# Patient Record
Sex: Female | Born: 1935 | Race: White | Hispanic: No | Marital: Married | Smoking: Never smoker
Health system: Southern US, Community
[De-identification: ages and names within clinical notes are randomized; demographics above are authoritative.]

## PROBLEM LIST (undated history)

## (undated) DIAGNOSIS — R002 Palpitations: Secondary | ICD-10-CM

## (undated) DIAGNOSIS — C50919 Malignant neoplasm of unspecified site of unspecified female breast: Secondary | ICD-10-CM

## (undated) DIAGNOSIS — J349 Unspecified disorder of nose and nasal sinuses: Secondary | ICD-10-CM

## (undated) DIAGNOSIS — I10 Essential (primary) hypertension: Secondary | ICD-10-CM

## (undated) DIAGNOSIS — M8580 Other specified disorders of bone density and structure, unspecified site: Secondary | ICD-10-CM

## (undated) DIAGNOSIS — K219 Gastro-esophageal reflux disease without esophagitis: Secondary | ICD-10-CM

## (undated) DIAGNOSIS — R238 Other skin changes: Secondary | ICD-10-CM

## (undated) DIAGNOSIS — I739 Peripheral vascular disease, unspecified: Secondary | ICD-10-CM

## (undated) DIAGNOSIS — M797 Fibromyalgia: Secondary | ICD-10-CM

## (undated) DIAGNOSIS — N281 Cyst of kidney, acquired: Secondary | ICD-10-CM

## (undated) DIAGNOSIS — R232 Flushing: Secondary | ICD-10-CM

## (undated) DIAGNOSIS — M549 Dorsalgia, unspecified: Secondary | ICD-10-CM

## (undated) DIAGNOSIS — R5383 Other fatigue: Secondary | ICD-10-CM

## (undated) DIAGNOSIS — J189 Pneumonia, unspecified organism: Secondary | ICD-10-CM

## (undated) DIAGNOSIS — M1990 Unspecified osteoarthritis, unspecified site: Secondary | ICD-10-CM

## (undated) DIAGNOSIS — IMO0001 Reserved for inherently not codable concepts without codable children: Secondary | ICD-10-CM

## (undated) DIAGNOSIS — R011 Cardiac murmur, unspecified: Secondary | ICD-10-CM

## (undated) DIAGNOSIS — R32 Unspecified urinary incontinence: Secondary | ICD-10-CM

## (undated) DIAGNOSIS — R42 Dizziness and giddiness: Secondary | ICD-10-CM

## (undated) DIAGNOSIS — R490 Dysphonia: Secondary | ICD-10-CM

## (undated) DIAGNOSIS — N390 Urinary tract infection, site not specified: Secondary | ICD-10-CM

## (undated) DIAGNOSIS — E214 Other specified disorders of parathyroid gland: Secondary | ICD-10-CM

## (undated) DIAGNOSIS — G4730 Sleep apnea, unspecified: Secondary | ICD-10-CM

## (undated) DIAGNOSIS — D249 Benign neoplasm of unspecified breast: Secondary | ICD-10-CM

## (undated) DIAGNOSIS — T148XXA Other injury of unspecified body region, initial encounter: Secondary | ICD-10-CM

## (undated) HISTORY — DX: Unspecified osteoarthritis, unspecified site: M19.90

## (undated) HISTORY — DX: Flushing: R23.2

## (undated) HISTORY — PX: GANGLION CYST EXCISION: SHX1691

## (undated) HISTORY — DX: Dysphonia: R49.0

## (undated) HISTORY — DX: Malignant neoplasm of unspecified site of unspecified female breast: C50.919

## (undated) HISTORY — DX: Gastro-esophageal reflux disease without esophagitis: K21.9

## (undated) HISTORY — PX: CHOLECYSTECTOMY: SHX55

## (undated) HISTORY — DX: Fibromyalgia: M79.7

## (undated) HISTORY — PX: ROTATOR CUFF REPAIR: SHX139

## (undated) HISTORY — PX: CATARACT EXTRACTION: SUR2

## (undated) HISTORY — PX: COLONOSCOPY: SHX174

## (undated) HISTORY — DX: Benign neoplasm of unspecified breast: D24.9

## (undated) HISTORY — DX: Unspecified urinary incontinence: R32

## (undated) HISTORY — PX: UPPER GI ENDOSCOPY: SHX6162

## (undated) HISTORY — PX: BREAST SURGERY: SHX581

## (undated) HISTORY — DX: Unspecified disorder of nose and nasal sinuses: J34.9

## (undated) HISTORY — DX: Dorsalgia, unspecified: M54.9

## (undated) HISTORY — DX: Other fatigue: R53.83

## (undated) HISTORY — PX: DILATION AND CURETTAGE OF UTERUS: SHX78

## (undated) HISTORY — PX: SPLENECTOMY: SUR1306

## (undated) HISTORY — PX: PANCREATIC CYST EXCISION: SHX2157

## (undated) MED FILL — Trastuzumab-Hyaluronidase-oysk Inj 600-10000 MG-Unit/5ML: SUBCUTANEOUS | Qty: 5 | Status: AC

## (undated) MED FILL — Haemophilus B Polysaccharide Conjugate Vaccine For Inj: INTRAMUSCULAR | Qty: 0.5 | Status: AC

## (undated) MED FILL — Meningococcal (A, C, Y, and W-135) Oligo Conj Vac For Inj: INTRAMUSCULAR | Qty: 0.5 | Status: AC

---

## 1999-09-01 ENCOUNTER — Other Ambulatory Visit: Admission: RE | Admit: 1999-09-01 | Discharge: 1999-09-01 | Payer: Self-pay | Admitting: Obstetrics and Gynecology

## 1999-09-01 ENCOUNTER — Encounter (INDEPENDENT_AMBULATORY_CARE_PROVIDER_SITE_OTHER): Payer: Self-pay | Admitting: Specialist

## 2000-09-06 ENCOUNTER — Other Ambulatory Visit: Admission: RE | Admit: 2000-09-06 | Discharge: 2000-09-06 | Payer: Self-pay | Admitting: Obstetrics and Gynecology

## 2001-09-14 ENCOUNTER — Other Ambulatory Visit: Admission: RE | Admit: 2001-09-14 | Discharge: 2001-09-14 | Payer: Self-pay | Admitting: Obstetrics and Gynecology

## 2002-10-03 ENCOUNTER — Other Ambulatory Visit: Admission: RE | Admit: 2002-10-03 | Discharge: 2002-10-03 | Payer: Self-pay | Admitting: Obstetrics and Gynecology

## 2003-10-08 ENCOUNTER — Other Ambulatory Visit: Admission: RE | Admit: 2003-10-08 | Discharge: 2003-10-08 | Payer: Self-pay | Admitting: Obstetrics and Gynecology

## 2004-10-07 ENCOUNTER — Other Ambulatory Visit: Admission: RE | Admit: 2004-10-07 | Discharge: 2004-10-07 | Payer: Self-pay | Admitting: Obstetrics and Gynecology

## 2005-04-02 ENCOUNTER — Ambulatory Visit: Payer: Self-pay | Admitting: Internal Medicine

## 2005-04-14 ENCOUNTER — Ambulatory Visit: Payer: Self-pay | Admitting: Internal Medicine

## 2005-08-25 ENCOUNTER — Other Ambulatory Visit: Admission: RE | Admit: 2005-08-25 | Discharge: 2005-08-25 | Payer: Self-pay | Admitting: Radiology

## 2006-10-12 ENCOUNTER — Other Ambulatory Visit: Admission: RE | Admit: 2006-10-12 | Discharge: 2006-10-12 | Payer: Self-pay | Admitting: Obstetrics and Gynecology

## 2008-11-13 ENCOUNTER — Ambulatory Visit: Payer: Self-pay | Admitting: Obstetrics and Gynecology

## 2008-11-13 ENCOUNTER — Other Ambulatory Visit: Admission: RE | Admit: 2008-11-13 | Discharge: 2008-11-13 | Payer: Self-pay | Admitting: Obstetrics and Gynecology

## 2008-11-13 ENCOUNTER — Encounter: Payer: Self-pay | Admitting: Obstetrics and Gynecology

## 2008-11-29 ENCOUNTER — Ambulatory Visit: Payer: Self-pay | Admitting: Obstetrics and Gynecology

## 2008-12-24 ENCOUNTER — Ambulatory Visit: Payer: Self-pay | Admitting: Obstetrics and Gynecology

## 2009-09-05 ENCOUNTER — Other Ambulatory Visit: Admission: RE | Admit: 2009-09-05 | Discharge: 2009-09-05 | Payer: Self-pay | Admitting: Radiology

## 2009-11-19 ENCOUNTER — Ambulatory Visit: Payer: Self-pay | Admitting: Obstetrics and Gynecology

## 2010-12-10 ENCOUNTER — Other Ambulatory Visit (HOSPITAL_COMMUNITY)
Admission: RE | Admit: 2010-12-10 | Discharge: 2010-12-10 | Disposition: A | Discharge status: Home / Self Care | Payer: Medicare Other | Source: Ambulatory Visit | Attending: Obstetrics and Gynecology | Admitting: Obstetrics and Gynecology

## 2010-12-10 ENCOUNTER — Encounter (INDEPENDENT_AMBULATORY_CARE_PROVIDER_SITE_OTHER): Payer: Medicare Other | Admitting: Obstetrics and Gynecology

## 2010-12-10 ENCOUNTER — Other Ambulatory Visit: Payer: Self-pay | Admitting: Obstetrics and Gynecology

## 2010-12-10 DIAGNOSIS — N951 Menopausal and female climacteric states: Secondary | ICD-10-CM

## 2010-12-10 DIAGNOSIS — R82998 Other abnormal findings in urine: Secondary | ICD-10-CM

## 2010-12-10 DIAGNOSIS — Z124 Encounter for screening for malignant neoplasm of cervix: Secondary | ICD-10-CM | POA: Insufficient documentation

## 2010-12-10 DIAGNOSIS — K644 Residual hemorrhoidal skin tags: Secondary | ICD-10-CM

## 2010-12-10 DIAGNOSIS — N8111 Cystocele, midline: Secondary | ICD-10-CM

## 2010-12-10 DIAGNOSIS — N952 Postmenopausal atrophic vaginitis: Secondary | ICD-10-CM

## 2011-01-21 ENCOUNTER — Encounter (INDEPENDENT_AMBULATORY_CARE_PROVIDER_SITE_OTHER): Payer: Medicare Other

## 2011-01-21 DIAGNOSIS — M949 Disorder of cartilage, unspecified: Secondary | ICD-10-CM

## 2011-01-21 DIAGNOSIS — M899 Disorder of bone, unspecified: Secondary | ICD-10-CM

## 2011-02-09 ENCOUNTER — Encounter: Payer: Self-pay | Admitting: Obstetrics and Gynecology

## 2011-02-09 ENCOUNTER — Ambulatory Visit (INDEPENDENT_AMBULATORY_CARE_PROVIDER_SITE_OTHER): Payer: Medicare Other | Admitting: Obstetrics and Gynecology

## 2011-02-09 DIAGNOSIS — M810 Age-related osteoporosis without current pathological fracture: Secondary | ICD-10-CM

## 2011-02-09 MED ORDER — ALENDRONATE SODIUM 70 MG PO TABS: 70.0000 mg | ORAL_TABLET | ORAL | Status: DC

## 2011-02-09 NOTE — Progress Notes (Signed)
The patient came in today at my request. Her bone density now shows low bone mass with an elevated fracture risk at the hip. She's had her calcium and parathyroid hormone monitored by her internist. She now has normal levels of these but he's restricted calcium and she stopped her D. Because with her calcium. She is not exercising anymore because of her peripheral neuropathy. She is a nonsmoker nondrinker.  Plan I have sent her to a neurologist and she will discuss with her exercises she can do with her. I think she needs biphosphonate  treatment because of her elevated fracture risk. She does have GERD but takes Nexium every night. We will try alendronate. Hopefully she will not have any reflux with that but she will let me know. We also checked the vitamin D level today.

## 2011-02-10 ENCOUNTER — Telehealth: Payer: Self-pay | Admitting: *Deleted

## 2011-02-10 LAB — VITAMIN D 25 HYDROXY (VIT D DEFICIENCY, FRACTURES): Vit D, 25-Hydroxy: 31 ng/mL (ref 30–89)

## 2011-02-10 NOTE — Telephone Encounter (Signed)
Message copied by Aura Camps on Tue Feb 10, 2011 10:12 AM ------      Message from: Trellis Paganini      Created: Tue Feb 10, 2011  9:13 AM       Tell patient all normal

## 2011-02-10 NOTE — Telephone Encounter (Signed)
PT INFORMED WITH NORMAL RESULTS.

## 2011-02-10 NOTE — Progress Notes (Signed)
PT. NOTIFIED OF DR. GOTTSEGENS NOTE. LEFT DETAILED MESSAGE ON HOME VOICEMAIL.

## 2011-10-30 ENCOUNTER — Other Ambulatory Visit: Payer: Self-pay | Admitting: *Deleted

## 2011-10-30 DIAGNOSIS — N63 Unspecified lump in unspecified breast: Secondary | ICD-10-CM

## 2011-10-30 DIAGNOSIS — N6009 Solitary cyst of unspecified breast: Secondary | ICD-10-CM

## 2011-11-17 ENCOUNTER — Other Ambulatory Visit: Payer: Self-pay | Admitting: *Deleted

## 2011-11-17 DIAGNOSIS — N6320 Unspecified lump in the left breast, unspecified quadrant: Secondary | ICD-10-CM

## 2011-11-18 ENCOUNTER — Other Ambulatory Visit: Payer: Self-pay | Admitting: Obstetrics and Gynecology

## 2011-11-18 ENCOUNTER — Other Ambulatory Visit: Payer: Self-pay | Admitting: *Deleted

## 2011-11-18 DIAGNOSIS — N6009 Solitary cyst of unspecified breast: Secondary | ICD-10-CM

## 2011-11-18 DIAGNOSIS — R928 Other abnormal and inconclusive findings on diagnostic imaging of breast: Secondary | ICD-10-CM

## 2011-11-18 DIAGNOSIS — N6320 Unspecified lump in the left breast, unspecified quadrant: Secondary | ICD-10-CM

## 2011-11-18 DIAGNOSIS — N63 Unspecified lump in unspecified breast: Secondary | ICD-10-CM

## 2011-11-20 ENCOUNTER — Other Ambulatory Visit: Payer: Self-pay | Admitting: Radiology

## 2011-11-23 ENCOUNTER — Other Ambulatory Visit: Payer: Self-pay | Admitting: Radiology

## 2011-11-23 DIAGNOSIS — C50912 Malignant neoplasm of unspecified site of left female breast: Secondary | ICD-10-CM

## 2011-11-24 ENCOUNTER — Telehealth: Payer: Self-pay | Admitting: *Deleted

## 2011-11-24 ENCOUNTER — Other Ambulatory Visit: Payer: Self-pay | Admitting: *Deleted

## 2011-11-24 DIAGNOSIS — C50219 Malignant neoplasm of upper-inner quadrant of unspecified female breast: Secondary | ICD-10-CM | POA: Insufficient documentation

## 2011-11-24 NOTE — Telephone Encounter (Signed)
Confirmed [facility] for 12/02/11 at 1200 .  Instructions and contact information given.

## 2011-11-25 ENCOUNTER — Encounter: Payer: Self-pay | Admitting: Obstetrics and Gynecology

## 2011-11-27 ENCOUNTER — Other Ambulatory Visit: Payer: Medicare Other

## 2011-11-29 ENCOUNTER — Ambulatory Visit
Admission: RE | Admit: 2011-11-29 | Discharge: 2011-11-29 | Disposition: A | Discharge status: Home / Self Care | Payer: Medicare Other | Source: Ambulatory Visit | Attending: Radiology | Admitting: Radiology

## 2011-11-29 DIAGNOSIS — C50912 Malignant neoplasm of unspecified site of left female breast: Secondary | ICD-10-CM

## 2011-11-29 MED ORDER — GADOBENATE DIMEGLUMINE 529 MG/ML IV SOLN
14.0000 mL | Freq: Once | INTRAVENOUS | Status: AC | PRN
  Administered 2011-11-29: 14 mL via INTRAVENOUS

## 2011-12-02 ENCOUNTER — Ambulatory Visit: Payer: Medicare Other

## 2011-12-02 ENCOUNTER — Ambulatory Visit: Payer: Medicare Other | Admitting: Physical Therapy

## 2011-12-02 ENCOUNTER — Ambulatory Visit (HOSPITAL_BASED_OUTPATIENT_CLINIC_OR_DEPARTMENT_OTHER): Payer: Medicare Other | Admitting: General Surgery

## 2011-12-02 ENCOUNTER — Ambulatory Visit (HOSPITAL_BASED_OUTPATIENT_CLINIC_OR_DEPARTMENT_OTHER): Payer: Medicare Other | Admitting: Oncology

## 2011-12-02 ENCOUNTER — Encounter (INDEPENDENT_AMBULATORY_CARE_PROVIDER_SITE_OTHER): Payer: Self-pay | Admitting: General Surgery

## 2011-12-02 ENCOUNTER — Encounter: Payer: Self-pay | Admitting: *Deleted

## 2011-12-02 ENCOUNTER — Other Ambulatory Visit: Payer: Self-pay | Admitting: Obstetrics and Gynecology

## 2011-12-02 ENCOUNTER — Encounter: Payer: Self-pay | Admitting: Specialist

## 2011-12-02 ENCOUNTER — Ambulatory Visit
Admission: RE | Admit: 2011-12-02 | Discharge: 2011-12-02 | Disposition: A | Discharge status: Home / Self Care | Payer: Medicare Other | Source: Ambulatory Visit | Attending: Radiation Oncology | Admitting: Radiation Oncology

## 2011-12-02 ENCOUNTER — Other Ambulatory Visit (HOSPITAL_BASED_OUTPATIENT_CLINIC_OR_DEPARTMENT_OTHER): Payer: Medicare Other | Admitting: Lab

## 2011-12-02 VITALS — BP 149/86 | HR 103 | Temp 97.5°F | Ht 64.5 in | Wt 155.1 lb

## 2011-12-02 DIAGNOSIS — C50219 Malignant neoplasm of upper-inner quadrant of unspecified female breast: Secondary | ICD-10-CM

## 2011-12-02 DIAGNOSIS — N63 Unspecified lump in unspecified breast: Secondary | ICD-10-CM

## 2011-12-02 DIAGNOSIS — Z170 Estrogen receptor positive status [ER+]: Secondary | ICD-10-CM

## 2011-12-02 DIAGNOSIS — N6009 Solitary cyst of unspecified breast: Secondary | ICD-10-CM

## 2011-12-02 LAB — COMPREHENSIVE METABOLIC PANEL
ALT: 20 U/L (ref 0–35)
Alkaline Phosphatase: 142 U/L — ABNORMAL HIGH (ref 39–117)
CO2: 28 mEq/L (ref 19–32)
Sodium: 134 mEq/L — ABNORMAL LOW (ref 135–145)
Total Bilirubin: 0.4 mg/dL (ref 0.3–1.2)
Total Protein: 7.4 g/dL (ref 6.0–8.3)

## 2011-12-02 LAB — CBC WITH DIFFERENTIAL/PLATELET
EOS%: 2.1 % (ref 0.0–7.0)
Eosinophils Absolute: 0.2 10*3/uL (ref 0.0–0.5)
MCV: 89.3 fL (ref 79.5–101.0)
MONO%: 11.4 % (ref 0.0–14.0)
NEUT#: 3.4 10*3/uL (ref 1.5–6.5)
RBC: 4.67 10*6/uL (ref 3.70–5.45)
RDW: 13.4 % (ref 11.2–14.5)
lymph#: 2.7 10*3/uL (ref 0.9–3.3)
nRBC: 0 % (ref 0–0)

## 2011-12-02 LAB — CANCER ANTIGEN 27.29: CA 27.29: 46 U/mL — ABNORMAL HIGH (ref 0–39)

## 2011-12-02 NOTE — Progress Notes (Addendum)
Patient ID: Amy Kline, female   DOB: 1929/07/18, 78 y.o.   MRN: [id number]  Chief Complaint  Patient presents with  . Other    breast cancer    HPI Amy Kline is a 78 y.o. female.  Referred by Dr. Jeralyn Ruths HPI This is a 62 yof I know from past office visit for breast complaints.  She has a history of three image guided biopsies on the right side. She noted a new left breast mass a couple weeks prior to her regularly scheduled screening mammogram.  She has history of multiple cysts esp on right breast and waited until regular appt to have this evaluated.  She has no other complaints referable to her breasts except the mass that is present.  She states it is not tender and does not move.  She underwent mmg that showed heterogeneously dense breasts with a new 1.8 cm mass present.  US showed a 11 o'clock 2 cm from nipple a mass measuring 1.5 cm in diameter.  MRI shows a 1.9x1.4.2 cm upper inner quadrant left breast mass with linear enhancement extending 3.4 cm from this mass.  There was also a mass on the right which has since undergone u/s and this is an old lesion that is getting smaller and no further workup is recommended.  There is no enlarged axillary or internal mammary adenopathy.  Biopsy of the mass was performed and this shows likely IDC.  This is 100% er pos, 96% pr pos, Ki67 is 79% and her2 is not amplified.  She comes in today to discuss treatment options.  Past Medical History  Diagnosis Date  . GERD (gastroesophageal reflux disease)   . Fibromyalgia   . H/O colonoscopy 2003  . H/O bone density study 2012  . Fatigue   . Sinus problem   . Hoarseness of voice   . Incontinence   . Back pain   . Hot flashes   . Breast cancer     left breast    Past Surgical History  Procedure Date  . Cholecystectomy   . Rotator cuff repair   . Cystectomy     on pancreas  . Splenectomy   . Pancreatic cyst excision     Family History  Problem Relation Age of Onset  .  Lymphoma Mother   . Breast cancer Maternal Aunt     Social History History  Substance Use Topics  . Smoking status: Never Smoker   . Smokeless tobacco: Never Used  . Alcohol Use: No    Allergies  Allergen Reactions  . Neosporin (Neomycin-Polymyxin-Gramicidin) Rash    Current Outpatient Prescriptions  Medication Sig Dispense Refill  . alendronate (FOSAMAX) 70 MG tablet Take 1 tablet (70 mg total) by mouth every 7 (seven) days. Take with a full glass of water on an empty stomach.  4 tablet  12  . amLODipine (NORVASC) 5 MG tablet Take 5 mg by mouth daily.      Marland Kitchen aspirin 81 MG chewable tablet Chew 81 mg by mouth daily.      Marland Kitchen esomeprazole (NEXIUM) 40 MG capsule Take 40 mg by mouth daily before breakfast.      . lisinopril-hydrochlorothiazide (PRINZIDE,ZESTORETIC) 20-12.5 MG per tablet Take 1 tablet by mouth daily.      . Milnacipran (SAVELLA) 50 MG TABS Take 50 mg by mouth daily.        Review of Systems Review of Systems  Constitutional: Positive for fatigue. Negative for fever, chills and unexpected weight change.  HENT: Positive for sinus pressure. Negative for hearing loss, congestion, sore throat, trouble swallowing and voice change.   Eyes: Negative for visual disturbance.  Respiratory: Negative for cough and wheezing.   Cardiovascular: Negative for chest pain, palpitations and leg swelling.  Gastrointestinal: Negative for nausea, vomiting, abdominal pain, diarrhea, constipation, blood in stool, abdominal distention and anal bleeding.  Genitourinary: Negative for hematuria, vaginal bleeding and difficulty urinating.  Musculoskeletal: Positive for back pain. Negative for arthralgias.  Skin: Negative for rash and wound.  Neurological: Negative for seizures, syncope and headaches.  Hematological: Negative for adenopathy. Does not bruise/bleed easily.  Psychiatric/Behavioral: Negative for confusion.    There were no vitals taken for this visit.  Physical Exam Physical Exam   Vitals reviewed. Constitutional: She appears well-developed and well-nourished.  Neck: Neck supple.  Cardiovascular: Normal rate, regular rhythm and normal heart sounds.   Pulmonary/Chest: Effort normal and breath sounds normal. She has no wheezes. She has no rales. Right breast exhibits no inverted nipple, no mass, no nipple discharge, no skin change and no tenderness. Left breast exhibits mass. Left breast exhibits no inverted nipple, no nipple discharge, no skin change and no tenderness. Breasts are symmetrical.    Lymphadenopathy:    She has no cervical adenopathy.    Data Reviewed BILATERAL BREAST MRI WITH AND WITHOUT CONTRAST  Technique: Multiplanar, multisequence MR images of both breasts  were obtained prior to and following the intravenous administration  of 14ml of multihance. Three dimensional images were evaluated at  the independent DynaCad workstation.  Comparison: Mammograms dated 11/16/2011, 11/14/2010 and  04/16/2010.  Findings: There are scattered fibroglandular densities. There is  an enhancing 1.9 x 1.4 x 2.0 cm mass in the upper inner quadrant of  the left breast. 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  In the upper outer quadrant of the right breast there is an  enhancing 1.1 x 0.7 x 0.8 cm mass.  There is no enlarged axillary or internal mammary adenopathy  IMPRESSION:  1. 2 cm enhancing mass in the upper inner quadrant of the left  breast corresponding with the known malignancy. There is 3.4 cm of  linear enhancement extending anteriorly from the mass concerning  for DCIS.  2. 1.1 cm mass in the upper outer quadrant of the right breast  Second look ultrasound of both breast is recommended. If no  sonographic correlates are identified than MR guided core biopsies  would be suggested.   Assessment    Left breast cancer    Plan    Left mastectomy  We discussed the  staging and pathophysiology of breast cancer. We discussed all of the different options for treatment for breast cancer including surgery, chemotherapy, radiation therapy, Herceptin, and antiestrogen therapy.   We discussed a sentinel lymph node biopsy but neither medical or radiation oncology feel that we need to do this for treatement. I will not perform a sentinel node after seeing her in multidisciplinary fashion.   I think due to her breast size, location of the mass and her exam that a mastectomy is the best choice at this point.  We could attempt to do a lumpectomy but I think her result would be poor.  We would also need to biopsy the anterior extension to confirm if this is cancer (it looks like it might be dcis).  After a long discussion about survival, risks and benefits we have all agreed to proceed  with a simple mastectomy.  I offered her plastic surgery appt and she declined.  She asked about a bilateral mastectomy given her history of biopsies.  I told her this would not affect her survival and it is not medically indicated for her.  I spent about 30 minutes answering her and her families questions.   We have all agreed to proceed with a mastectomy after her grandsons upcoming wedding.  We discussed the risks of operation including bleeding, infection, possible reoperation. She understands her further therapy will be based on what her stages at the time of her operation.         Amy Kline 12/02/2011, 5:04 PM

## 2011-12-02 NOTE — Progress Notes (Signed)
I met the patient, her husband, and three daughters in the multidisciplinary breast clinic today.  She had good support from her family.  She spoke of her deep faith and good friends.  I encouraged her to attend the Breast Cancer Support Group; she did not want a referral to [facility] at this time.  Amy Kline rated her distress at no more than a "3".

## 2011-12-02 NOTE — Progress Notes (Signed)
ID: Belen Boutin   DOB: 23-Jun-1930  MR#: [id number]  WUJ#:[id number]  HISTORY OF PRESENT ILLNESS: The patient has a history of significant fibrocystic change, and has had multiple prior benign right breast biopsies. Accordingly she is followed with diagnostic mammography yearly. On  05/06//2013 there was a new hyperdense mass in the upper inner quadrant of the left breast, measuring 1.8 cm. Ultrasound defined a multilobulated hypoechoic mass measuring 1.5 cm. Biopsy was obtained  11/20/2011 and showed([id number]) and invasive breast cancer, grade 2, estrogen and progesterone receptor positive at 100% and 96% respectively, with an MIB-1 of 79%, but no HER-2 amplification. Bilateral breast MRI was obtained 11/29/2011 and confirmed a 1.9 cm enhancing mass in the upper inner quadrant of the left breast. 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 In addition, in the right breast a 1.1 cm enhancing mass was noted, which on subsequent ultrasound 12/01/2011 proved to be a complicated cyst measuring 5 mm and unchanged as compared ultrasound from May of 2012. The patient subsequent history is as detailed below.  INTERVAL HISTORY: Subia presents today at the multidisciplinary breast cancer clinic with her husband [name] and her daughters Thana Ates and Betha Loa to discuss a definitive treatment plan.   REVIEW OF SYSTEMS: Slomski describes herself as mildly fatigued, but this is not a new problem. She has fairly chronic sinus problems and is frequently course as a result. She has heartburn, stress urinary incontinence, and fairly chronic back pain. She has had some episodes where her left leg has been slightly weak. This usually occurs after a long car ride. She also had an episode of syncope in 2012, attributed to of taking blood pressure medications on an empty stomach and working out too long a hot kitchen. She has some hot  flashes. Otherwise a detailed review of systems was noncontributory. She does not exercise regularly.   PAST MEDICAL HISTORY: Past Medical History  Diagnosis Date  . GERD (gastroesophageal reflux disease)   . Breast cancer   . Fibromyalgia   . H/O colonoscopy 2003  . H/O bone density study 2012  . Fatigue   . Wears glasses   . Sinus problem   . Hoarseness of voice   . Incontinence   . Back pain   . Hot flashes   Osteoarthritis  PAST SURGICAL HISTORY: Past Surgical History  Procedure Date  . Cholecystectomy   . Rotator cuff repair   . Cystectomy     on pancreas  . Splenectomy   Status post right cataract surgery; left cataract in place  FAMILY HISTORY Family History  Problem Relation Age of Onset  . Lymphoma Mother   . Breast cancer Maternal Aunt    the patient's father died at the age of 32 from complications of emphysema, in the setting of tobacco abuse. The patient's mother died at the age of 54, having been diagnosed with non-Hodgkin's lymphoma 9 years earlier. The patient's mother had 3 sisters one of whom had breast cancer diagnosed in her 85s. The patient herself has 3 brothers and one sister. There is no other history of cancer in the family to the patient's knowledge  GYNECOLOGIC HISTORY: Menarche age 60, first live birth age 20, the patient is GX P3, menopause approximately age 21, with the patient taking hormone replacement for more than 20 years, stopping in 2011 to her recollection.   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 Her husband [name] retired from foot where  about 2 years ago. Daughter [name] is a Chartered loss adjuster, as is daughter [name]. Daughter Lorin Picket owns a frozen yogurt franchise the patient has 8 grandchildren. She attends a [employer].   ADVANCED DIRECTIVES: Not in place  HEALTH MAINTENANCE: History  Substance Use Topics  . Smoking status: Never Smoker   . Smokeless tobacco: Never Used  . Alcohol Use: No      Colonoscopy: 2003  PAP: 2012  Bone density: 2012  Lipid panel:  Allergies  Allergen Reactions  . Neosporin (Neomycin-Polymyxin-Gramicidin) Rash    Current Outpatient Prescriptions  Medication Sig Dispense Refill  . alendronate (FOSAMAX) 70 MG tablet Take 1 tablet (70 mg total) by mouth every 7 (seven) days. Take with a full glass of water on an empty stomach.  4 tablet  12  . amLODipine (NORVASC) 5 MG tablet Take 5 mg by mouth daily.      Marland Kitchen aspirin 81 MG chewable tablet Chew 81 mg by mouth daily.      Marland Kitchen esomeprazole (NEXIUM) 40 MG capsule Take 40 mg by mouth daily before breakfast.      . lisinopril-hydrochlorothiazide (PRINZIDE,ZESTORETIC) 20-12.5 MG per tablet Take 1 tablet by mouth daily.      . Milnacipran (SAVELLA) 50 MG TABS Take 50 mg by mouth daily.        OBJECTIVE: Middle-aged white woman in no acute distress  Filed Vitals:   12/02/11 1303  BP: 149/86  Pulse: 103  Temp: 97.5 F (36.4 C)     Body mass index is 26.21 kg/(m^2).    ECOG FS: 1  Sclerae unicteric Oropharynx clear No  cervical or supraclavicular adenopathy Lungs no rales or rhonchi Heart regular rate and rhythm Abd benign MSK no focal spinal tenderness, no peripheral edema Neuro: nonfocal Breasts: The right breast is unremarkable. In the left breast, there is a mass in the upper-inner quadrant, which by palpation measures approximately 2 cm, and is movable. There is no accompanying skin or nipple change.  LAB RESULTS: Lab Results  Component Value Date   WBC 7.1 12/02/2011   NEUTROABS 3.4 12/02/2011   HGB 14.8 12/02/2011   HCT 41.7 12/02/2011   MCV 89.3 12/02/2011   PLT 372 12/02/2011      Chemistry      Component Value Date/Time   NA 134* 12/02/2011 1220   K 3.8 12/02/2011 1220   CL 95* 12/02/2011 1220   CO2 28 12/02/2011 1220   BUN 17 12/02/2011 1220   CREATININE 0.73 12/02/2011 1220      Component Value Date/Time   CALCIUM 10.8* 12/02/2011 1220   ALKPHOS 142* 12/02/2011 1220   AST 23  12/02/2011 1220   ALT 20 12/02/2011 1220   BILITOT 0.4 12/02/2011 1220       No results found for this basename: LABCA2    No components found with this basename: LABCA125    No results found for this basename: INR:1;PROTIME:1 in the last 168 hours  Urinalysis No results found for this basename: colorurine, appearanceur, labspec, phurine, glucoseu, hgbur, bilirubinur, ketonesur, proteinur, urobilinogen, nitrite, leukocytesur    STUDIES:  Mr Breast Bilateral W Wo Contrast  11/30/2011  *RADIOLOGY REPORT*  Clinical Data: Newly diagnosed left breast invasive mammary carcinoma  BILATERAL BREAST MRI WITH AND WITHOUT CONTRAST  Technique: Multiplanar, multisequence MR images of both breasts were obtained prior to and following the intravenous administration of 14ml of multihance.  Three dimensional images were evaluated at the independent DynaCad workstation.  Comparison:  Mammograms dated 11/16/2011, 11/14/2010 and 04/16/2010.  Findings: There are scattered fibroglandular densities.  There is an enhancing 1.9 x 1.4 x 2.0 cm mass in the upper inner quadrant of the left breast.  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  In the upper outer quadrant of the right breast there is an enhancing 1.1 x 0.7 x 0.8 cm mass.  There is no enlarged axillary or internal mammary adenopathy  IMPRESSION:  1. 2 cm enhancing mass in the upper inner quadrant of the left breast corresponding with the known malignancy.  There is 3.4 cm of linear enhancement extending anteriorly from the mass concerning for DCIS. 2. 1.1 cm mass in the upper outer quadrant of the right breast  Second look ultrasound of both breast is recommended.  If no sonographic correlates are identified than MR guided core biopsies would be suggested.  THREE-DIMENSIONAL MR IMAGE RENDERING ON INDEPENDENT WORKSTATION:  Three-dimensional MR images were rendered by post-processing of  the original MR data on an independent workstation.  The three- dimensional MR images were interpreted, and findings were reported in the accompanying complete MRI report for this study.  BI-RADS CATEGORY 4:  Suspicious abnormality - biopsy should be considered.  Original Report Authenticated By: Littie Deeds. ARCEO, M.D.    ASSESSMENT: 78 year old [place] woman status post left breast biopsy 11/20/2011 for a clinical T1 N0, stage IA invasive mammary carcinoma which was grade 2, strongly estrogen and progesterone receptor positive, HER-2 negative, with an MIB-1 of 79%.  PLAN: Breast conserving surgery seems the best option for this patient, and this is being operationalized. She will meet with me approximately 2 weeks after her surgery, to make a definitive decision regarding systemic treatment, but I anticipate such a small benefit from chemotherapy that we would not want to recommend it. On the other hand she should get significant benefit from anti-estrogens and if her margins are negative and there are no of surprises, taking antiestrogens should allow her to forego radiation treatments.  [place] has heard good things about my partner Dr. Zipporah Plants, and likely will want to establish yourself in her service and complete her followup day her once her surgery and treatment decisions have been clarified.   Keagon Glascoe C    12/02/2011

## 2011-12-02 NOTE — Progress Notes (Signed)
Radiation Oncology         [phone]) [phone] ________________________________  Initial outpatient Consultation  Name: Amy Kline MRN: [id number]  Date: 12/02/2011  DOB: Oct 23, 1929  WJ:XBJYNW,GNFA, MD, MD Emelia Loron, [facility]  REFERRING PHYSICIAN: Emelia Loron  DIAGNOSIS: The encounter diagnosis was Cancer of upper-inner quadrant of female breast.  HISTORY OF PRESENT ILLNESS::Amy Kline is a 78 y.o. female who is   Seen as part of the multidisciplinary breast clinic. On routine screening mammography the patient was noted to have a suspicious area within the upper-inner aspect of the left breast. This was estimated to be approximately 1.8 cm. On  ultrasound  this area revealed a  1.5 cm lesion. She proceeded to  undergo biopsy this area with pathology significant for invasive mammary carcinoma  the tumor was estrogen and progesterone receptor positive with the ki- 67 of 79%.Marland Kitchen MRI revealed a 2 cm enhancing lesion within the upper inner quadrant of the left breast. 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  PREVIOUS RADIATION THERAPY: No  PAST MEDICAL HISTORY:  has a past medical history of GERD (gastroesophageal reflux disease); Breast cancer; Fibromyalgia; H/O colonoscopy (2003); H/O bone density study (2012); Fatigue; Wears glasses; Sinus problem; Hoarseness of voice; Incontinence; Back pain; and Hot flashes.    PAST SURGICAL HISTORY: Past Surgical History  Procedure Date  . Cholecystectomy   . Rotator cuff repair   . Cystectomy     on pancreas  . Splenectomy     FAMILY HISTORY: family history includes Breast cancer in her maternal aunt and Lymphoma in her mother.  SOCIAL HISTORY:  reports that she has never smoked. She has never used smokeless tobacco. She reports that she does not drink alcohol or use illicit drugs.  ALLERGIES: Neosporin  MEDICATIONS:  Current  Outpatient Prescriptions  Medication Sig Dispense Refill  . alendronate (FOSAMAX) 70 MG tablet Take 1 tablet (70 mg total) by mouth every 7 (seven) days. Take with a full glass of water on an empty stomach.  4 tablet  12  . amLODipine (NORVASC) 5 MG tablet Take 5 mg by mouth daily.      Marland Kitchen aspirin 81 MG chewable tablet Chew 81 mg by mouth daily.      Marland Kitchen esomeprazole (NEXIUM) 40 MG capsule Take 40 mg by mouth daily before breakfast.      . lisinopril-hydrochlorothiazide (PRINZIDE,ZESTORETIC) 20-12.5 MG per tablet Take 1 tablet by mouth daily.      . Milnacipran (SAVELLA) 50 MG TABS Take 50 mg by mouth daily.        REVIEW OF SYSTEMS:  A 15 point review of systems is documented in the electronic medical record. This was obtained by the nursing staff. However, I reviewed this with the patient to discuss relevant findings and make appropriate changes.  Prior to biopsy the patient denies any pain the breast area nipple discharge or bleeding. She denies any problems with swelling in her left arm or hand. In addition the patient denies any new bony pain headaches dizziness or blurred vision.   PHYSICAL EXAM: This is a very pleasant 78 year old female in no acute no acute distress. She appears younger than her stated age. She is accompanied by her husband as well as 3 daughters on evaluation today. Examination of the neck and supraclavicular region reveals no evidence of adenopathy. The axillary areas are free of adenopathy. Examination of lungs reveals them to be  clear. The heart has regular rhythm and rate. Examination right breast reveals no obvious mass or nipple discharge. Examination of  left breast reveals thickening in the upper-inner aspect the breast suspicious for malignancy. There is some  bruising associated with this area. The area of induration extends into the nipple areolar complex region. There's no obvious nipple discharge or bleeding noted.   LABORATORY DATA:  Lab Results  Component Value  Date   WBC 7.1 12/02/2011   HGB 14.8 12/02/2011   HCT 41.7 12/02/2011   MCV 89.3 12/02/2011   PLT 372 12/02/2011   Lab Results  Component Value Date   NA 134* 12/02/2011   K 3.8 12/02/2011   CL 95* 12/02/2011   CO2 28 12/02/2011   Lab Results  Component Value Date   ALT 20 12/02/2011   AST 23 12/02/2011   ALKPHOS 142* 12/02/2011   BILITOT 0.4 12/02/2011     RADIOGRAPHY: US Breast Bilateral    Mr Breast Bilateral W Wo Contrast  11/30/2011  *RADIOLOGY REPORT*  Clinical Data: Newly diagnosed left breast invasive mammary carcinoma  BILATERAL BREAST MRI WITH AND WITHOUT CONTRAST  Technique: Multiplanar, multisequence MR images of both breasts were obtained prior to and following the intravenous administration of 14ml of multihance.  Three dimensional images were evaluated at the independent DynaCad workstation.  Comparison:  Mammograms dated 11/16/2011, 11/14/2010 and 04/16/2010.  Findings: There are scattered fibroglandular densities.  There is an enhancing 1.9 x 1.4 x 2.0 cm mass in the upper inner quadrant of the left breast.  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  In the upper outer quadrant of the right breast there is an enhancing 1.1 x 0.7 x 0.8 cm mass.  There is no enlarged axillary or internal mammary adenopathy  IMPRESSION:  1. 2 cm enhancing mass in the upper inner quadrant of the left breast corresponding with the known malignancy.  There is 3.4 cm of linear enhancement extending anteriorly from the mass concerning for DCIS. 2. 1.1 cm mass in the upper outer quadrant of the right breast  Second look ultrasound of both breast is recommended.  If no sonographic correlates are identified than MR guided core biopsies would be suggested.  THREE-DIMENSIONAL MR IMAGE RENDERING ON INDEPENDENT WORKSTATION:  Three-dimensional MR images were rendered by post-processing of the original MR data on an independent workstation.   The three- dimensional MR images were interpreted, and findings were reported in the accompanying complete MRI report for this study.  BI-RADS CATEGORY 4:  Suspicious abnormality - biopsy should be considered.  Original Report Authenticated By: Littie Deeds. Judyann Munson, M.D.      IMPRESSION: Invasive mammary carcinoma of the left breast. Given the size of the tumor in relationship to the patient's breast size this would be difficult to have a good cosmetic result with the partial mastectomy. In this situation the patient may better may be better served with proceeding with a mastectomy. This in addition  is the patient's desire at this point. After the patients surgery she likely will undergo adjuvant hormonal therapy. Unless the  patient has a positive surgical margin or her tumor is much larger larger than the expected, she will not require any radiation therapy.  ------------------------------------------------   Billie Lade, PhD, MD

## 2011-12-03 ENCOUNTER — Encounter: Payer: Self-pay | Admitting: Oncology

## 2011-12-03 ENCOUNTER — Telehealth: Payer: Self-pay | Admitting: Oncology

## 2011-12-03 NOTE — Progress Notes (Signed)
New patient, patient has insurance, patient was not in need of financial assistance at this time did give patient my contact information, patient will contact advocate if anything changes.

## 2011-12-03 NOTE — Telephone Encounter (Signed)
S/w the pt and she is aware of her appt on 01/05/2012

## 2011-12-04 ENCOUNTER — Encounter (HOSPITAL_COMMUNITY): Payer: Self-pay | Admitting: Pharmacy Technician

## 2011-12-04 ENCOUNTER — Encounter: Payer: Self-pay | Admitting: *Deleted

## 2011-12-04 NOTE — Progress Notes (Signed)
Mailed after appt letter to pt. 

## 2011-12-08 ENCOUNTER — Telehealth: Payer: Self-pay | Admitting: *Deleted

## 2011-12-08 ENCOUNTER — Other Ambulatory Visit: Payer: Self-pay | Admitting: Oncology

## 2011-12-08 NOTE — Telephone Encounter (Signed)
Spoke to pt concerning BMDC from 5/22.  Pt denies questions or concerns regarding dx or treatment care plan.  Encourage pt to call with needs.  Confirmed surgery date and f/u appt.  Contact information given.

## 2011-12-08 NOTE — Telephone Encounter (Signed)
No answer. Will attempt to reach pt again to discuss BMDC from 5/22

## 2011-12-14 ENCOUNTER — Encounter (HOSPITAL_COMMUNITY)
Admission: RE | Admit: 2011-12-14 | Discharge: 2011-12-14 | Disposition: A | Discharge status: Home / Self Care | Payer: Medicare Other | Source: Ambulatory Visit | Attending: General Surgery | Admitting: General Surgery

## 2011-12-14 ENCOUNTER — Ambulatory Visit (HOSPITAL_COMMUNITY)
Admission: RE | Admit: 2011-12-14 | Discharge: 2011-12-14 | Disposition: A | Discharge status: Home / Self Care | Payer: Medicare Other | Source: Ambulatory Visit | Attending: General Surgery | Admitting: General Surgery

## 2011-12-14 ENCOUNTER — Encounter (HOSPITAL_COMMUNITY): Payer: Self-pay

## 2011-12-14 DIAGNOSIS — Z01810 Encounter for preprocedural cardiovascular examination: Secondary | ICD-10-CM | POA: Insufficient documentation

## 2011-12-14 DIAGNOSIS — Z01818 Encounter for other preprocedural examination: Secondary | ICD-10-CM | POA: Insufficient documentation

## 2011-12-14 DIAGNOSIS — Z01812 Encounter for preprocedural laboratory examination: Secondary | ICD-10-CM | POA: Insufficient documentation

## 2011-12-14 HISTORY — DX: Essential (primary) hypertension: I10

## 2011-12-14 HISTORY — DX: Sleep apnea, unspecified: G47.30

## 2011-12-14 HISTORY — DX: Hypercalcemia: E83.52

## 2011-12-14 HISTORY — DX: Other injury of unspecified body region, initial encounter: T14.8XXA

## 2011-12-14 HISTORY — DX: Other skin changes: R23.8

## 2011-12-14 HISTORY — DX: Cardiac murmur, unspecified: R01.1

## 2011-12-14 HISTORY — DX: Other specified disorders of bone density and structure, unspecified site: M85.80

## 2011-12-14 LAB — BASIC METABOLIC PANEL
Calcium: 10.6 mg/dL — ABNORMAL HIGH (ref 8.4–10.5)
Creatinine, Ser: 0.55 mg/dL (ref 0.50–1.10)
GFR calc Af Amer: >90 mL/min (ref >90)

## 2011-12-14 LAB — SURGICAL PCR SCREEN: MRSA, PCR: NEGATIVE

## 2011-12-14 LAB — CBC
MCH: 32.2 pg (ref 26.0–34.0)
MCV: 91.6 fL (ref 78.0–100.0)
Platelets: 362 10*3/uL (ref 150–400)
RDW: 13.5 % (ref 11.5–15.5)

## 2011-12-14 NOTE — Patient Instructions (Addendum)
20 Amy Kline  12/14/2011   Your procedure is scheduled on:  12/15/11  Report to SHORT STAY DEPT  at 8:45 AM.  Call this number if you have problems the morning of surgery: [phone]   Remember:   Do not eat food or drink liquids AFTER MIDNIGHT    Take these medicines the morning of surgery with A SIP OF WATER: amlodipine  / milnacipran   Do not wear jewelry, make-up or nail polish.  Do not wear lotions, powders, or perfumes.   Do not shave legs or underarms 48 hrs. before surgery (men may shave face)  Do not bring valuables to the hospital.  Contacts, dentures or bridgework may not be worn into surgery.  Leave suitcase in the car. After surgery it may be brought to your room.  For patients admitted to the hospital, checkout time is 11:00 AM the day of discharge.   Patients discharged the day of surgery will not be allowed to drive home.    Special Instructions:   Please read over the following fact sheets that you were given: MRSA  Information               SHOWER WITH BETASEPT THE NIGHT BEFORE SURGERY AND THE MORNING OF SURGERY

## 2011-12-15 ENCOUNTER — Encounter (HOSPITAL_BASED_OUTPATIENT_CLINIC_OR_DEPARTMENT_OTHER): Payer: Self-pay

## 2011-12-15 ENCOUNTER — Encounter (HOSPITAL_COMMUNITY)
Admission: RE | Disposition: A | Discharge status: Home / Self Care | Payer: Self-pay | Source: Ambulatory Visit | Attending: General Surgery

## 2011-12-15 ENCOUNTER — Ambulatory Visit (HOSPITAL_COMMUNITY): Payer: Medicare Other | Admitting: Anesthesiology

## 2011-12-15 ENCOUNTER — Ambulatory Visit (HOSPITAL_BASED_OUTPATIENT_CLINIC_OR_DEPARTMENT_OTHER): Admit: 2011-12-15 | Payer: Self-pay | Admitting: General Surgery

## 2011-12-15 ENCOUNTER — Ambulatory Visit (HOSPITAL_COMMUNITY)
Admission: RE | Admit: 2011-12-15 | Discharge: 2011-12-16 | Disposition: A | Discharge status: Home / Self Care | Payer: Medicare Other | Source: Ambulatory Visit | Attending: General Surgery | Admitting: General Surgery

## 2011-12-15 ENCOUNTER — Encounter (HOSPITAL_COMMUNITY): Payer: Self-pay | Admitting: Anesthesiology

## 2011-12-15 ENCOUNTER — Encounter (HOSPITAL_COMMUNITY): Payer: Self-pay | Admitting: *Deleted

## 2011-12-15 DIAGNOSIS — Z170 Estrogen receptor positive status [ER+]: Secondary | ICD-10-CM | POA: Insufficient documentation

## 2011-12-15 DIAGNOSIS — Z01810 Encounter for preprocedural cardiovascular examination: Secondary | ICD-10-CM | POA: Insufficient documentation

## 2011-12-15 DIAGNOSIS — Z01818 Encounter for other preprocedural examination: Secondary | ICD-10-CM | POA: Insufficient documentation

## 2011-12-15 DIAGNOSIS — C50219 Malignant neoplasm of upper-inner quadrant of unspecified female breast: Secondary | ICD-10-CM | POA: Insufficient documentation

## 2011-12-15 DIAGNOSIS — IMO0001 Reserved for inherently not codable concepts without codable children: Secondary | ICD-10-CM | POA: Insufficient documentation

## 2011-12-15 DIAGNOSIS — Z01812 Encounter for preprocedural laboratory examination: Secondary | ICD-10-CM | POA: Insufficient documentation

## 2011-12-15 DIAGNOSIS — K219 Gastro-esophageal reflux disease without esophagitis: Secondary | ICD-10-CM | POA: Insufficient documentation

## 2011-12-15 DIAGNOSIS — Z79899 Other long term (current) drug therapy: Secondary | ICD-10-CM | POA: Insufficient documentation

## 2011-12-15 DIAGNOSIS — C50919 Malignant neoplasm of unspecified site of unspecified female breast: Secondary | ICD-10-CM

## 2011-12-15 HISTORY — PX: MASTECTOMY: SHX3

## 2011-12-15 SURGERY — SIMPLE MASTECTOMY
Anesthesia: General | Site: Breast | Laterality: Left | Wound class: Clean

## 2011-12-15 SURGERY — SIMPLE MASTECTOMY
Anesthesia: General | Laterality: Left

## 2011-12-15 MED ORDER — LACTATED RINGERS IV SOLN
INTRAVENOUS | Status: DC
  Administered 2011-12-15: 1000 mL via INTRAVENOUS

## 2011-12-15 MED ORDER — LISINOPRIL-HYDROCHLOROTHIAZIDE 20-12.5 MG PO TABS: 1.0000 | ORAL_TABLET | Freq: Every day | ORAL | Status: DC

## 2011-12-15 MED ORDER — ACETAMINOPHEN 650 MG RE SUPP: 650.0000 mg | Freq: Four times a day (QID) | RECTAL | Status: DC | PRN

## 2011-12-15 MED ORDER — HYDROCHLOROTHIAZIDE 12.5 MG PO CAPS
12.5000 mg | ORAL_CAPSULE | Freq: Every day | ORAL | Status: DC
  Administered 2011-12-16: 12.5 mg via ORAL
  Filled 2011-12-15: qty 1

## 2011-12-15 MED ORDER — FENTANYL CITRATE 0.05 MG/ML IJ SOLN
INTRAMUSCULAR | Status: DC | PRN
  Administered 2011-12-15: 50 ug via INTRAVENOUS
  Administered 2011-12-15: 100 ug via INTRAVENOUS
  Administered 2011-12-15 (×4): 50 ug via INTRAVENOUS

## 2011-12-15 MED ORDER — KETOROLAC TROMETHAMINE 15 MG/ML IJ SOLN
INTRAMUSCULAR | Status: AC
  Filled 2011-12-15: qty 2

## 2011-12-15 MED ORDER — METOCLOPRAMIDE HCL 5 MG/ML IJ SOLN
INTRAMUSCULAR | Status: DC | PRN
  Administered 2011-12-15: 10 mg via INTRAVENOUS

## 2011-12-15 MED ORDER — HYDROMORPHONE HCL PF 1 MG/ML IJ SOLN
0.2500 mg | INTRAMUSCULAR | Status: DC | PRN
  Administered 2011-12-15 (×2): 0.5 mg via INTRAVENOUS

## 2011-12-15 MED ORDER — SODIUM CHLORIDE 0.9 % IV SOLN
INTRAVENOUS | Status: DC
  Administered 2011-12-15: 14:00:00 via INTRAVENOUS

## 2011-12-15 MED ORDER — LIDOCAINE HCL (CARDIAC) 20 MG/ML IV SOLN
INTRAVENOUS | Status: DC | PRN
  Administered 2011-12-15: 50 mg via INTRAVENOUS

## 2011-12-15 MED ORDER — ONDANSETRON HCL 4 MG/2ML IJ SOLN: 4.0000 mg | Freq: Four times a day (QID) | INTRAMUSCULAR | Status: DC | PRN

## 2011-12-15 MED ORDER — ACETAMINOPHEN 10 MG/ML IV SOLN
INTRAVENOUS | Status: DC | PRN
  Administered 2011-12-15: 1000 mg via INTRAVENOUS

## 2011-12-15 MED ORDER — LISINOPRIL 20 MG PO TABS
20.0000 mg | ORAL_TABLET | Freq: Every day | ORAL | Status: DC
  Filled 2011-12-15: qty 1

## 2011-12-15 MED ORDER — MILNACIPRAN HCL 50 MG PO TABS
50.0000 mg | ORAL_TABLET | Freq: Two times a day (BID) | ORAL | Status: DC
  Administered 2011-12-15 – 2011-12-16 (×2): 50 mg via ORAL
  Filled 2011-12-15 (×3): qty 1

## 2011-12-15 MED ORDER — PROMETHAZINE HCL 25 MG/ML IJ SOLN: 6.2500 mg | INTRAMUSCULAR | Status: DC | PRN

## 2011-12-15 MED ORDER — MORPHINE SULFATE 2 MG/ML IJ SOLN
2.0000 mg | INTRAMUSCULAR | Status: DC | PRN
  Administered 2011-12-15 – 2011-12-16 (×3): 2 mg via INTRAVENOUS
  Filled 2011-12-15 (×3): qty 1

## 2011-12-15 MED ORDER — ACETAMINOPHEN 10 MG/ML IV SOLN
INTRAVENOUS | Status: AC
  Filled 2011-12-15: qty 100

## 2011-12-15 MED ORDER — AMLODIPINE BESYLATE 5 MG PO TABS
5.0000 mg | ORAL_TABLET | Freq: Every day | ORAL | Status: DC
  Administered 2011-12-16: 5 mg via ORAL
  Filled 2011-12-15: qty 1

## 2011-12-15 MED ORDER — PANTOPRAZOLE SODIUM 40 MG PO TBEC
40.0000 mg | DELAYED_RELEASE_TABLET | Freq: Every day | ORAL | Status: DC
  Administered 2011-12-15: 40 mg via ORAL
  Filled 2011-12-15 (×2): qty 1

## 2011-12-15 MED ORDER — PROPOFOL 10 MG/ML IV BOLUS
INTRAVENOUS | Status: DC | PRN
  Administered 2011-12-15: 150 mg via INTRAVENOUS
  Administered 2011-12-15: 50 mg via INTRAVENOUS

## 2011-12-15 MED ORDER — CEFAZOLIN SODIUM-DEXTROSE 2-3 GM-% IV SOLR
INTRAVENOUS | Status: AC
  Filled 2011-12-15: qty 50

## 2011-12-15 MED ORDER — DEXAMETHASONE SODIUM PHOSPHATE 10 MG/ML IJ SOLN
INTRAMUSCULAR | Status: DC | PRN
  Administered 2011-12-15: 10 mg via INTRAVENOUS

## 2011-12-15 MED ORDER — HYDROMORPHONE HCL PF 1 MG/ML IJ SOLN
INTRAMUSCULAR | Status: AC
  Filled 2011-12-15: qty 2

## 2011-12-15 MED ORDER — 0.9 % SODIUM CHLORIDE (POUR BTL) OPTIME
TOPICAL | Status: DC | PRN
  Administered 2011-12-15: 1000 mL

## 2011-12-15 MED ORDER — ACETAMINOPHEN-CODEINE #3 300-30 MG PO TABS
1.0000 | ORAL_TABLET | ORAL | Status: DC | PRN
  Administered 2011-12-16: 1 via ORAL
  Filled 2011-12-15: qty 1

## 2011-12-15 MED ORDER — ONDANSETRON HCL 4 MG/2ML IJ SOLN
INTRAMUSCULAR | Status: DC | PRN
  Administered 2011-12-15: 4 mg via INTRAVENOUS

## 2011-12-15 MED ORDER — OXYCODONE HCL 5 MG PO TABS: 5.0000 mg | ORAL_TABLET | ORAL | Status: DC | PRN

## 2011-12-15 MED ORDER — KETOROLAC TROMETHAMINE 30 MG/ML IJ SOLN
15.0000 mg | Freq: Once | INTRAMUSCULAR | Status: AC | PRN
  Administered 2011-12-15: 30 mg via INTRAVENOUS
  Filled 2011-12-15: qty 1

## 2011-12-15 MED ORDER — ACETAMINOPHEN 325 MG PO TABS: 650.0000 mg | ORAL_TABLET | Freq: Four times a day (QID) | ORAL | Status: DC | PRN

## 2011-12-15 MED ORDER — CEFAZOLIN SODIUM-DEXTROSE 2-3 GM-% IV SOLR
2.0000 g | INTRAVENOUS | Status: AC
  Administered 2011-12-15: 2 g via INTRAVENOUS

## 2011-12-15 SURGICAL SUPPLY — 53 items
ADH SKN CLS APL DERMABOND .7 (GAUZE/BANDAGES/DRESSINGS) ×1
APL SKNCLS STERI-STRIP NONHPOA (GAUZE/BANDAGES/DRESSINGS)
APPLIER CLIP 11 MED OPEN (CLIP)
APR CLP MED 11 20 MLT OPN (CLIP)
BENZOIN TINCTURE PRP APPL 2/3 (GAUZE/BANDAGES/DRESSINGS) IMPLANT
BINDER BREAST LRG (GAUZE/BANDAGES/DRESSINGS) ×2 IMPLANT
BINDER BREAST XLRG (GAUZE/BANDAGES/DRESSINGS) IMPLANT
BLADE HEX COATED 2.75 (ELECTRODE) ×2 IMPLANT
BNDG COHESIVE 6X5 TAN STRL LF (GAUZE/BANDAGES/DRESSINGS) IMPLANT
CANISTER SUCTION 2500CC (MISCELLANEOUS) ×2 IMPLANT
CHLORAPREP W/TINT 26ML (MISCELLANEOUS) ×2 IMPLANT
CLIP APPLIE 11 MED OPEN (CLIP) IMPLANT
CLOTH BEACON ORANGE TIMEOUT ST (SAFETY) ×2 IMPLANT
COVER MAYO STAND STRL (DRAPES) IMPLANT
COVER SURGICAL LIGHT HANDLE (MISCELLANEOUS) IMPLANT
DERMABOND ADVANCED (GAUZE/BANDAGES/DRESSINGS) ×1
DERMABOND ADVANCED .7 DNX12 (GAUZE/BANDAGES/DRESSINGS) ×1 IMPLANT
DRAIN CHANNEL 19F RND (DRAIN) ×2 IMPLANT
DRAIN CHANNEL RND F F (WOUND CARE) IMPLANT
DRAPE LAPAROSCOPIC ABDOMINAL (DRAPES) ×2 IMPLANT
DRAPE LG THREE QUARTER DISP (DRAPES) ×2 IMPLANT
DRAPE ORTHO SPLIT 77X108 STRL (DRAPES)
DRAPE SURG ORHT 6 SPLT 77X108 (DRAPES) IMPLANT
DRSG PAD ABDOMINAL 8X10 ST (GAUZE/BANDAGES/DRESSINGS) ×2 IMPLANT
ELECT BLADE TIP CTD 4 INCH (ELECTRODE) ×2 IMPLANT
ELECT REM PT RETURN 9FT ADLT (ELECTROSURGICAL) ×2
ELECTRODE REM PT RTRN 9FT ADLT (ELECTROSURGICAL) ×1 IMPLANT
EVACUATOR DRAINAGE 10X20 100CC (DRAIN) ×1 IMPLANT
EVACUATOR SILICONE 100CC (DRAIN) ×6 IMPLANT
GLOVE BIO SURGEON STRL SZ7 (GLOVE) ×2 IMPLANT
GLOVE BIOGEL PI IND STRL 7.5 (GLOVE) ×1 IMPLANT
GLOVE BIOGEL PI INDICATOR 7.5 (GLOVE) ×1
GOWN PREVENTION PLUS LG XLONG (DISPOSABLE) ×2 IMPLANT
GOWN PREVENTION PLUS XLARGE (GOWN DISPOSABLE) ×2 IMPLANT
GOWN STRL NON-REIN LRG LVL3 (GOWN DISPOSABLE) ×2 IMPLANT
GOWN STRL REIN XL XLG (GOWN DISPOSABLE) ×2 IMPLANT
KIT BASIN OR (CUSTOM PROCEDURE TRAY) ×2 IMPLANT
NS IRRIG 1000ML POUR BTL (IV SOLUTION) ×2 IMPLANT
PACK GENERAL/GYN (CUSTOM PROCEDURE TRAY) ×2 IMPLANT
PEN SKIN MARKING BROAD (MISCELLANEOUS) ×2 IMPLANT
SLEEVE STOCKINETTE LIMB 4X8 (MISCELLANEOUS) IMPLANT
SPONGE DRAIN TRACH 4X4 STRL 2S (GAUZE/BANDAGES/DRESSINGS) ×2 IMPLANT
SPONGE GAUZE 4X4 12PLY (GAUZE/BANDAGES/DRESSINGS) IMPLANT
SPONGE LAP 18X18 X RAY DECT (DISPOSABLE) IMPLANT
STAPLER VISISTAT 35W (STAPLE) ×2 IMPLANT
STOCKINETTE 8 INCH (MISCELLANEOUS) IMPLANT
STRIP CLOSURE SKIN 1/2X4 (GAUZE/BANDAGES/DRESSINGS) ×2 IMPLANT
SUT ETHILON 2 0 PS N (SUTURE) ×2 IMPLANT
SUT MNCRL AB 4-0 PS2 18 (SUTURE) ×2 IMPLANT
SUT MON AB 5-0 PS2 18 (SUTURE) ×2 IMPLANT
SUT VIC AB 2-0 SH 18 (SUTURE) ×4 IMPLANT
SUT VIC AB 3-0 SH 8-18 (SUTURE) ×2 IMPLANT
TOWEL OR 17X26 10 PK STRL BLUE (TOWEL DISPOSABLE) ×2 IMPLANT

## 2011-12-15 NOTE — Anesthesia Postprocedure Evaluation (Signed)
  Anesthesia Post-op Note  Patient: Amy Kline  Procedure(s) Performed: Procedure(s) (LRB): SIMPLE MASTECTOMY (Left)  Patient Location: PACU  Anesthesia Type: General  Level of Consciousness: awake and alert   Airway and Oxygen Therapy: Patient Spontanous Breathing  Post-op Pain: mild  Post-op Assessment: Post-op Vital signs reviewed, Patient's Cardiovascular Status Stable, Respiratory Function Stable, Patent Airway and No signs of Nausea or vomiting  Post-op Vital Signs: stable  Complications: No apparent anesthesia complications

## 2011-12-15 NOTE — Transfer of Care (Signed)
Immediate Anesthesia Transfer of Care Note  Patient: Amy Kline  Procedure(s) Performed: Procedure(s) (LRB): SIMPLE MASTECTOMY (Left)  Patient Location: PACU  Anesthesia Type: General  Level of Consciousness: awake, alert , oriented and patient cooperative  Airway & Oxygen Therapy: Patient Spontanous Breathing and Patient connected to face mask oxygen  Post-op Assessment: Report given to PACU RN and Post -op Vital signs reviewed and stable  Post vital signs: Reviewed and stable  Complications: No apparent anesthesia complications

## 2011-12-15 NOTE — Interval H&P Note (Signed)
History and Physical Interval Note:  12/15/2011 11:19 AM Amy Kline is scheduled for left simple mastectomy today without sentinel node as discussed with Dr. Darnelle Catalan and Dr. Roselind Messier.  Amy Kline  has presented today for surgery, with the diagnosis of breast cancer  The various methods of treatment have been discussed with the patient and family. After consideration of risks, benefits and other options for treatment, the patient has consented to  Procedure(s) (LRB): SIMPLE MASTECTOMY (Left) as a surgical intervention .  The patients' history has been reviewed, patient examined, no change in status, stable for surgery.  I have reviewed the patients' chart and labs.  Questions were answered to the patient's satisfaction.     Amy Kline

## 2011-12-15 NOTE — H&P (View-Only) (Signed)
Patient ID: Amy Kline, female   DOB: 02-Feb-1930, 78 y.o.   MRN: [id number]  Chief Complaint  Patient presents with  . Other    breast cancer    HPI Amy Kline is a 78 y.o. female.  Referred by Dr. Jeralyn Ruths HPI This is a 46 yof I know from past office visit for breast complaints.  She has a history of three image guided biopsies on the right side. She noted a new left breast mass a couple weeks prior to her regularly scheduled screening mammogram.  She has history of multiple cysts esp on right breast and waited until regular appt to have this evaluated.  She has no other complaints referable to her breasts except the mass that is present.  She states it is not tender and does not move.  She underwent mmg that showed heterogeneously dense breasts with a new 1.8 cm mass present.  US showed a 11 o'clock 2 cm from nipple a mass measuring 1.5 cm in diameter.  MRI shows a 1.9x1.4.2 cm upper inner quadrant left breast mass with linear enhancement extending 3.4 cm from this mass.  There was also a mass on the right which has since undergone u/s and this is an old lesion that is getting smaller and no further workup is recommended.  There is no enlarged axillary or internal mammary adenopathy.  Biopsy of the mass was performed and this shows likely IDC.  This is 100% er pos, 96% pr pos, Ki67 is 79% and her2 is not amplified.  She comes in today to discuss treatment options.  Past Medical History  Diagnosis Date  . GERD (gastroesophageal reflux disease)   . Fibromyalgia   . H/O colonoscopy 2003  . H/O bone density study 2012  . Fatigue   . Sinus problem   . Hoarseness of voice   . Incontinence   . Back pain   . Hot flashes   . Breast cancer     left breast    Past Surgical History  Procedure Date  . Cholecystectomy   . Rotator cuff repair   . Cystectomy     on pancreas  . Splenectomy   . Pancreatic cyst excision     Family History  Problem Relation Age of Onset  .  Lymphoma Mother   . Breast cancer Maternal Aunt     Social History History  Substance Use Topics  . Smoking status: Never Smoker   . Smokeless tobacco: Never Used  . Alcohol Use: No    Allergies  Allergen Reactions  . Neosporin (Neomycin-Polymyxin-Gramicidin) Rash    Current Outpatient Prescriptions  Medication Sig Dispense Refill  . alendronate (FOSAMAX) 70 MG tablet Take 1 tablet (70 mg total) by mouth every 7 (seven) days. Take with a full glass of water on an empty stomach.  4 tablet  12  . amLODipine (NORVASC) 5 MG tablet Take 5 mg by mouth daily.      Marland Kitchen aspirin 81 MG chewable tablet Chew 81 mg by mouth daily.      Marland Kitchen esomeprazole (NEXIUM) 40 MG capsule Take 40 mg by mouth daily before breakfast.      . lisinopril-hydrochlorothiazide (PRINZIDE,ZESTORETIC) 20-12.5 MG per tablet Take 1 tablet by mouth daily.      . Milnacipran (SAVELLA) 50 MG TABS Take 50 mg by mouth daily.        Review of Systems Review of Systems  Constitutional: Positive for fatigue. Negative for fever, chills and unexpected weight change.  HENT: Positive for sinus pressure. Negative for hearing loss, congestion, sore throat, trouble swallowing and voice change.   Eyes: Negative for visual disturbance.  Respiratory: Negative for cough and wheezing.   Cardiovascular: Negative for chest pain, palpitations and leg swelling.  Gastrointestinal: Negative for nausea, vomiting, abdominal pain, diarrhea, constipation, blood in stool, abdominal distention and anal bleeding.  Genitourinary: Negative for hematuria, vaginal bleeding and difficulty urinating.  Musculoskeletal: Positive for back pain. Negative for arthralgias.  Skin: Negative for rash and wound.  Neurological: Negative for seizures, syncope and headaches.  Hematological: Negative for adenopathy. Does not bruise/bleed easily.  Psychiatric/Behavioral: Negative for confusion.    There were no vitals taken for this visit.  Physical Exam Physical Exam   Vitals reviewed. Constitutional: She appears well-developed and well-nourished.  Neck: Neck supple.  Cardiovascular: Normal rate, regular rhythm and normal heart sounds.   Pulmonary/Chest: Effort normal and breath sounds normal. She has no wheezes. She has no rales. Right breast exhibits no inverted nipple, no mass, no nipple discharge, no skin change and no tenderness. Left breast exhibits mass. Left breast exhibits no inverted nipple, no nipple discharge, no skin change and no tenderness. Breasts are symmetrical.    Lymphadenopathy:    She has no cervical adenopathy.    Data Reviewed BILATERAL BREAST MRI WITH AND WITHOUT CONTRAST  Technique: Multiplanar, multisequence MR images of both breasts  were obtained prior to and following the intravenous administration  of 14ml of multihance. Three dimensional images were evaluated at  the independent DynaCad workstation.  Comparison: Mammograms dated 11/16/2011, 11/14/2010 and  04/16/2010.  Findings: There are scattered fibroglandular densities. There is  an enhancing 1.9 x 1.4 x 2.0 cm mass in the upper inner quadrant of  the left breast. 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  In the upper outer quadrant of the right breast there is an  enhancing 1.1 x 0.7 x 0.8 cm mass.  There is no enlarged axillary or internal mammary adenopathy  IMPRESSION:  1. 2 cm enhancing mass in the upper inner quadrant of the left  breast corresponding with the known malignancy. There is 3.4 cm of  linear enhancement extending anteriorly from the mass concerning  for DCIS.  2. 1.1 cm mass in the upper outer quadrant of the right breast  Second look ultrasound of both breast is recommended. If no  sonographic correlates are identified than MR guided core biopsies  would be suggested.   Assessment    Left breast cancer    Plan    Left breast lumpectomy  We  discussed the staging and pathophysiology of breast cancer. We discussed all of the different options for treatment for breast cancer including surgery, chemotherapy, radiation therapy, Herceptin, and antiestrogen therapy.   We discussed a sentinel lymph node biopsy but neither medical or radiation oncology feel that we need to do this for treatement. I will not perform a sentinel node after seeing her in multidisciplinary fashion.   I think due to her breast size, location of the mass and her exam that a mastectomy is the best choice at this point.  We could attempt to do a lumpectomy but I think her result would be poor.  We would also need to biopsy the anterior extension to confirm if this is cancer (it looks like it might be dcis).  After a long discussion about survival, risks and benefits we have all agreed to  proceed with a simple mastectomy.  I offered her plastic surgery appt and she declined.  She asked about a bilateral mastectomy given her history of biopsies.  I told her this would not affect her survival and it is not medically indicated for her.  I spent about 30 minutes answering her and her families questions.   We have all agreed to proceed with a mastectomy after her grandsons upcoming wedding.  We discussed the risks of operation including bleeding, infection, possible reoperation. She understands her further therapy will be based on what her stages at the time of her operation.         Amelia Macken 12/02/2011, 5:04 PM

## 2011-12-15 NOTE — Anesthesia Preprocedure Evaluation (Addendum)
Anesthesia Evaluation  Patient identified by MRN, date of birth, ID band Patient awake  General Assessment Comment:Treat as PONV  Reviewed: Allergy & Precautions, H&P , NPO status , Patient's Chart, lab work & pertinent test results  Airway Mallampati: II TM Distance: <3 FB Neck ROM: Limited    Dental No notable dental hx.    Pulmonary neg pulmonary ROS,  breath sounds clear to auscultation  Pulmonary exam normal       Cardiovascular hypertension, Pt. on medications Rhythm:Regular Rate:Normal     Neuro/Psych negative neurological ROS  negative psych ROS   GI/Hepatic Neg liver ROS, GERD-  Medicated,  Endo/Other  negative endocrine ROS  Renal/GU negative Renal ROS  negative genitourinary   Musculoskeletal negative musculoskeletal ROS (+)   Abdominal   Peds negative pediatric ROS (+)  Hematology negative hematology ROS (+)   Anesthesia Other Findings   Reproductive/Obstetrics negative OB ROS                          Anesthesia Physical Anesthesia Plan  ASA: II  Anesthesia Plan: General   Post-op Pain Management:    Induction: Intravenous  Airway Management Planned: LMA and Oral ETT  Additional Equipment:   Intra-op Plan:   Post-operative Plan: Extubation in OR  Informed Consent: I have reviewed the patients History and Physical, chart, labs and discussed the procedure including the risks, benefits and alternatives for the proposed anesthesia with the patient or authorized representative who has indicated his/her understanding and acceptance.   Dental advisory given  Plan Discussed with: CRNA  Anesthesia Plan Comments:         Anesthesia Quick Evaluation

## 2011-12-15 NOTE — Op Note (Signed)
Preoperative diagnosis: Clinical stage II left breast cancer Postoperative diagnosis: Same as above Procedure: Left simple mastectomy Surgeon: Dr. Harden Mo Drains: 77 French Blake drain to mastectomy space Specimens: Left mastectomy marked with a short stitch superior, long stitch lateral Estimated blood loss: 50 cc Complications: None Sponge and needle count correct x2 at end of operation Disposition to recovery in stable condition  Indications: This is a 78 year old female I know from prior breast biopsies. She had a new left breast mass that was evaluated and appeared to be about a 2 cm left breast mass on all her imaging. Biopsy was obtained showing a hormone receptor-positive invasive ductal carcinoma. We discussed her options and clinically this appeared to be a little bit over 2 cm on my exam and  I was concerned it might be fixed as well. After a long discussion with she and her family we decided on a left simple mastectomy. In multidisciplinary fashion we decided not to biopsy a sentinel lymph node as this was not going to change the other recommendations. She clinically was node negative.  Procedure: After informed consent was obtained the patient was taken to the operating room. She was administered 1 g of intravenous cefazolin. Sequential compression devices were placed on her legs. She was then placed under general anesthesia with an LMA. Her left breast and axilla were then prepped and draped in the standard sterile surgical fashion. A surgical timeout was then performed.  I made an elliptical incision encompassing the nipple areolar complex as well as the skin overlying the tumor. Flaps were then carried out to the clavicle superiorly, sternum medially, inframammary crease inferiorly, and the latissimus laterally. Several small vessels were ligated with Vicryl suture. I then removed the breast tissue including the pectoralis fascia from the pectoralis muscle. The tumor was very  close to this but it did not appear to grossly invading the muscle.  I then used Vicryl ties to ligate the lateral attachments near the muscle. The breast tissues and passed off the table and marked as above. Hemostasis was then obtained. I then placed a 88 [place] Blake drain and secured this with a 2-0 nylon suture. Irrigation was performed. I then closes with 3-0 undyed Vicryl and 5-0 Monocryl. The drain was functional upon completion. Dermabond and Steri-Strips were both placed. Dressings were placed and a breast binder was also placed. She was awakened in the operating room and transferred to recovery in stable condition.

## 2011-12-16 ENCOUNTER — Encounter (INDEPENDENT_AMBULATORY_CARE_PROVIDER_SITE_OTHER): Payer: Self-pay | Admitting: General Surgery

## 2011-12-16 ENCOUNTER — Telehealth (INDEPENDENT_AMBULATORY_CARE_PROVIDER_SITE_OTHER): Payer: Self-pay

## 2011-12-16 MED ORDER — OXYCODONE HCL 5 MG PO TABS: 5.0000 mg | ORAL_TABLET | ORAL | Status: AC | PRN

## 2011-12-16 NOTE — Progress Notes (Signed)
Patient given handout on Bulb drain care. Reviewed with pt and her family.  Demonstrated for pt and family how to empty and recharge the drain.

## 2011-12-16 NOTE — Discharge Instructions (Signed)
[facility], [place] [phone]  MASTECTOMY: POST OP INSTRUCTIONS  Always review your discharge instruction sheet given to you by the facility where your surgery was performed. IF YOU HAVE DISABILITY OR FAMILY LEAVE FORMS, YOU MUST BRING THEM TO THE OFFICE FOR PROCESSING.   DO NOT GIVE THEM TO YOUR DOCTOR. A prescription for pain medication may be given to you upon discharge.  Take your pain medication as prescribed, if needed.  If narcotic pain medicine is not needed, then you may take acetaminophen (Tylenol), naprosyn (Alleve) or ibuprofen (Advil) as needed. 1. Take your usually prescribed medications unless otherwise directed. Start taking your aspirin again on Friday 2. If you need a refill on your pain medication, please contact your pharmacy.  They will contact our office to request authorization.  Prescriptions will not be filled after 5pm or on week-ends. 3. You should follow a light diet the first few days after arrival home, such as soup and crackers, etc.  Resume your normal diet the day after surgery. 4. Most patients will experience some swelling and bruising on the chest and underarm.  Ice packs will help.  Swelling and bruising can take several days to resolve. Wear the binder day and night until you return to the office.  5. It is common to experience some constipation if taking pain medication after surgery.  Increasing fluid intake and taking a stool softener (such as Colace) will usually help or prevent this problem from occurring.  A mild laxative (Milk of Magnesia or Miralax) should be taken according to package instructions if there are no bowel movements after 48 hours. 6. Unless discharge instructions indicate otherwise, leave your bandage dry and in place until your next appointment in 3-5 days.  You may take a limited sponge bath.  No tube baths or showers until the drains are removed.  You may have steri-strips (small skin tapes) in place directly over the  incision.  These strips should be left on the skin for 7-10 days. If you have glue it will come off in next couple week.  Any sutures will be removed at an office visit 7. DRAINS:  If you have drains in place, it is important to keep a list of the amount of drainage produced each day in your drains.  Before leaving the hospital, you should be instructed on drain care.  Call our office if you have any questions about your drains. I will remove your drains when they put out less than 30 cc or ml for 2 consecutive days. 8. ACTIVITIES:  You may resume regular (light) daily activities beginning the next day--such as daily self-care, walking, climbing stairs--gradually increasing activities as tolerated.  You may have sexual intercourse when it is comfortable.  Refrain from any heavy lifting or straining until approved by your doctor. a. You may drive when you are no longer taking prescription pain medication, you can comfortably wear a seatbelt, and you can safely maneuver your car and apply brakes. b. RETURN TO WORK:  __________________________________________________________ 9. You should see your doctor in the office for a follow-up appointment approximately 3-5 days after your surgery.  Your doctor's nurse will typically make your follow-up appointment when she calls you with your pathology report.  Expect your pathology report 3-4business days after surgery. 10. OTHER INSTRUCTIONS: ______________________________________________________________________________________________ ____________________________________________________________________________________________ WHEN TO CALL YOUR DR Kinzee Happel: 1. Fever over 101.0 2. Nausea and/or vomiting 3. Extreme swelling or bruising 4. Continued bleeding from incision. 5. Increased pain, redness, or drainage from  the incision. The clinic staff is available to answer your questions during regular business hours.  Please don't hesitate to call and ask to speak to  one of the nurses for clinical concerns.  If you have a medical emergency, go to the nearest emergency room or call 911.  A surgeon from [facility] is always on call at the hospital. [address], [place], [place]  [address] ? [address] [address], [place], [place]   [address] [phone] ? [phone] ? FAX [phone] Web site: [email or web address]

## 2011-12-16 NOTE — Progress Notes (Signed)
1 Day Post-Op  Subjective: Doing well no complaints  Objective: Vital signs in last 24 hours: Temp:  [97.7 F (36.5 C)-98.7 F (37.1 C)] 98.4 F (36.9 C) (06/05 0555) Pulse Rate:  [94-108] 94  (06/05 0555) Resp:  [12-18] 17  (06/05 0555) BP: (112-154)/(67-87) 151/85 mmHg (06/05 0555) SpO2:  [94 %-100 %] 97 % (06/05 0555) FiO2 (%):  [100 %] 100 % (06/04 2028) Weight:  [157 lb (71.215 kg)] 157 lb (71.215 kg) (06/04 2028) Last BM Date: 12/14/11  Intake/Output from previous day: 06/04 0701 - 06/05 0700 In: 1443.8 [I.V.:1443.8] Out: 2055 [Urine:1875; Drains:130; Blood:50] Intake/Output this shift:    General appearance: no distress Incision/Wound:flaps viable, drain serosang  Lab Results:   Basename 12/14/11 1435  WBC 8.9  HGB 14.5  HCT 41.3  PLT 362   BMET  Basename 12/14/11 1435  NA 136  K 3.9  CL 97  CO2 29  GLUCOSE 96  BUN 16  CREATININE 0.55  CALCIUM 10.6*    Assessment/Plan: POD 1 left mastectomy  Dc home today   LOS: 1 day    [facility] 12/16/2011

## 2011-12-16 NOTE — Telephone Encounter (Signed)
Dr. Dwain Sarna paged to call Magda Paganini [phone].

## 2011-12-16 NOTE — Discharge Summary (Signed)
Physician Discharge Summary  Patient ID: Amy Kline MRN: [id number] DOB/AGE: [birth year] 78 y.o.  Admit date: 12/15/2011 Discharge date: 12/16/2011  Admission Diagnoses: Left breast cancer  Discharge Diagnoses:  Active Problems:  * No active hospital problems. *    Discharged Condition: good  Hospital Course: 58 yof who was diagnosed with left breast cancer and seen in multidisciplinary clinic.  We have decided to proceed with left simple mastectomy without sentinel node biopsy.  She underwent left mastectomy without complication. Postoperatively she is ambulating, tolerating diet, pain controlled and with expected drain output.  Consults: None  Significant Diagnostic Studies: none  Treatments: surgery: left mastectomy    Disposition:Home  Discharge Orders    Future Appointments: Provider: Department: Dept Phone: Center:   12/25/2011 11:20 AM Emelia Loron, MD [facility] [phone] None   01/05/2012 4:30 PM Lowella Dell, MD [facility] [phone] None   01/21/2012 4:00 PM Trellis Paganini, MD [facility] [phone] [facility]     Medication List  As of 12/16/2011  8:22 AM   STOP taking these medications         aspirin 81 MG chewable tablet         TAKE these medications         amLODipine 5 MG tablet   Commonly known as: NORVASC   Take 5 mg by mouth daily with breakfast.      esomeprazole 40 MG capsule   Commonly known as: NEXIUM   Take 40 mg by mouth every evening.      lisinopril-hydrochlorothiazide 20-12.5 MG per tablet   Commonly known as: PRINZIDE,ZESTORETIC   Take 1 tablet by mouth daily with breakfast.      Milnacipran 50 MG Tabs   Commonly known as: SAVELLA   Take 50 mg by mouth 2 (two) times daily.      oxyCODONE 5 MG immediate release tablet   Commonly known as: Oxy IR/ROXICODONE   Take 1 tablet (5 mg total) by mouth every 4 (four) hours as needed.           Follow-up Information    Follow up with  [facility], [place]. Schedule an appointment as soon as possible for a visit in 1 week. (Will call with appointment)    Contact information:   [facility] [address] [place] [address] [phone]          Signed: Emelia Loron 12/16/2011, 8:22 AM

## 2011-12-21 ENCOUNTER — Ambulatory Visit (INDEPENDENT_AMBULATORY_CARE_PROVIDER_SITE_OTHER): Payer: Medicare Other | Admitting: General Surgery

## 2011-12-21 ENCOUNTER — Encounter (INDEPENDENT_AMBULATORY_CARE_PROVIDER_SITE_OTHER): Payer: Self-pay | Admitting: General Surgery

## 2011-12-21 VITALS — BP 134/86 | HR 84 | Temp 99.3°F | Resp 16 | Ht 64.5 in | Wt 154.6 lb

## 2011-12-21 DIAGNOSIS — Z09 Encounter for follow-up examination after completed treatment for conditions other than malignant neoplasm: Secondary | ICD-10-CM

## 2011-12-21 NOTE — Progress Notes (Signed)
Subjective:     Patient ID: Amy Kline, female   DOB: 08-02-1929, 78 y.o.   MRN: [id number]  HPI 37 yof s/p left mastectomy for left breast cancer a week ago who presents today doing well without any real complaints. Her drain is still putting out around 30cc per day of bloody fluid. Her path shows a 1.6 cm IDC and we discussed this today.  I also provided her pathology.  Review of Systems     Objective:   Physical Exam Flaps viable, incision clean without infection, drain with serosang fluid    Assessment:     S/p left mastectomy for T1cNX tumor    Plan:     Continue drain and will see back in 1 week or sooner if less than 30 cc per day for 2 days Discussed path and she has appt to see Dr. Darnelle Catalan

## 2011-12-22 ENCOUNTER — Encounter (INDEPENDENT_AMBULATORY_CARE_PROVIDER_SITE_OTHER): Payer: Self-pay | Admitting: General Surgery

## 2011-12-22 ENCOUNTER — Encounter (INDEPENDENT_AMBULATORY_CARE_PROVIDER_SITE_OTHER): Payer: Self-pay

## 2011-12-25 ENCOUNTER — Ambulatory Visit (INDEPENDENT_AMBULATORY_CARE_PROVIDER_SITE_OTHER): Payer: Medicare Other | Admitting: General Surgery

## 2011-12-25 ENCOUNTER — Encounter (INDEPENDENT_AMBULATORY_CARE_PROVIDER_SITE_OTHER): Payer: Self-pay | Admitting: General Surgery

## 2011-12-25 VITALS — BP 116/80 | HR 83 | Temp 97.4°F | Resp 14 | Ht 65.5 in | Wt 153.8 lb

## 2011-12-25 DIAGNOSIS — Z09 Encounter for follow-up examination after completed treatment for conditions other than malignant neoplasm: Secondary | ICD-10-CM

## 2011-12-25 NOTE — Progress Notes (Signed)
Subjective:     Patient ID: Amy Kline, female   DOB: [birth year], 78 y.o.   MRN: [id number]  HPI 22 yof s/p left mastectomy for 1.6 cm tumor.  I removed her drain today as it was putting out less than 30cc per day since our last visit. She has no real complaints. We discussed her Her2 status as now being 2.28 amplified as opposed to not amplified on core.  Review of Systems     Objective:   Physical Exam    well healing incision with viable flaps, drain with serosang fluid Assessment:     S/p left mastectomy    Plan:     I removed drain and gave warning signs for seroma I will see in 2 weeks F/U with Dr. Darnelle Catalan as planned

## 2011-12-31 ENCOUNTER — Encounter (INDEPENDENT_AMBULATORY_CARE_PROVIDER_SITE_OTHER): Payer: Self-pay | Admitting: General Surgery

## 2012-01-05 ENCOUNTER — Ambulatory Visit (HOSPITAL_BASED_OUTPATIENT_CLINIC_OR_DEPARTMENT_OTHER): Payer: Medicare Other | Admitting: Oncology

## 2012-01-05 VITALS — BP 130/78 | HR 82 | Temp 97.5°F | Ht 65.5 in | Wt 156.7 lb

## 2012-01-05 DIAGNOSIS — M1990 Unspecified osteoarthritis, unspecified site: Secondary | ICD-10-CM

## 2012-01-05 DIAGNOSIS — Z170 Estrogen receptor positive status [ER+]: Secondary | ICD-10-CM

## 2012-01-05 DIAGNOSIS — C50219 Malignant neoplasm of upper-inner quadrant of unspecified female breast: Secondary | ICD-10-CM

## 2012-01-05 MED ORDER — TAMOXIFEN CITRATE 20 MG PO TABS: 20.0000 mg | ORAL_TABLET | Freq: Every day | ORAL | Status: AC
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
## 2012-01-06 ENCOUNTER — Telehealth: Payer: Self-pay | Admitting: Oncology

## 2012-01-06 NOTE — Telephone Encounter (Signed)
S/w the pt and she is aware of her July/aug appts and the appts with dr benshimon along with the echo

## 2012-01-07 ENCOUNTER — Other Ambulatory Visit: Payer: Self-pay | Admitting: Oncology

## 2012-01-07 ENCOUNTER — Encounter: Payer: Self-pay | Admitting: Oncology

## 2012-01-07 DIAGNOSIS — M1990 Unspecified osteoarthritis, unspecified site: Secondary | ICD-10-CM | POA: Insufficient documentation

## 2012-01-08 ENCOUNTER — Ambulatory Visit (INDEPENDENT_AMBULATORY_CARE_PROVIDER_SITE_OTHER): Payer: Medicare Other | Admitting: General Surgery

## 2012-01-08 ENCOUNTER — Encounter (INDEPENDENT_AMBULATORY_CARE_PROVIDER_SITE_OTHER): Payer: Self-pay | Admitting: General Surgery

## 2012-01-08 VITALS — BP 158/90 | HR 99 | Temp 98.0°F | Ht 65.5 in | Wt 152.2 lb

## 2012-01-08 DIAGNOSIS — Z09 Encounter for follow-up examination after completed treatment for conditions other than malignant neoplasm: Secondary | ICD-10-CM

## 2012-01-08 NOTE — Progress Notes (Signed)
Subjective:     Patient ID: Amy Kline, female   DOB: 1930-06-17, 78 y.o.   MRN: [id number]  HPI 78 yof s/p left simple mastectomy who is doing well.  Drains out and she has no complaints.  Her final path shows her 2 positivity and she has been seen by med onc and will do herceptin.    Review of Systems     Objective:   Physical Exam Left mastectomy incision clean without infection    Assessment:     S/p left sm    Plan:     I gave her rx for postmastectomy supplies and also referral to abc class. I will see back in 6 months or sooner if any problems.

## 2012-01-11 ENCOUNTER — Encounter: Payer: Self-pay | Admitting: Gynecology

## 2012-01-11 DIAGNOSIS — D249 Benign neoplasm of unspecified breast: Secondary | ICD-10-CM | POA: Insufficient documentation

## 2012-01-13 ENCOUNTER — Other Ambulatory Visit: Payer: Self-pay | Admitting: Oncology

## 2012-01-21 ENCOUNTER — Encounter: Payer: Medicare Other | Admitting: Obstetrics and Gynecology

## 2012-01-21 ENCOUNTER — Ambulatory Visit (HOSPITAL_COMMUNITY): Payer: Medicare Other | Attending: Oncology

## 2012-02-04 ENCOUNTER — Telehealth: Payer: Self-pay | Admitting: *Deleted

## 2012-02-04 NOTE — Telephone Encounter (Signed)
Pt called c/o "itching" since she started taking Tamoxifen in late June.  Pt relate it has progressively gotten worse.  Per Dr. Darrall Dears recommendations pt should stop Tamoxifen until she sees him for f/u in August.  Informed pt on Dr. Darrall Dears instructions.  Received verbal understanding.  Confirmed f/u appt in August.

## 2012-02-08 ENCOUNTER — Encounter (HOSPITAL_COMMUNITY): Payer: Self-pay

## 2012-02-08 ENCOUNTER — Ambulatory Visit (HOSPITAL_COMMUNITY)
Admission: RE | Admit: 2012-02-08 | Discharge: 2012-02-08 | Disposition: A | Discharge status: Home / Self Care | Payer: Medicare Other | Source: Ambulatory Visit | Attending: Internal Medicine | Admitting: Internal Medicine

## 2012-02-08 ENCOUNTER — Ambulatory Visit (HOSPITAL_COMMUNITY)
Admission: RE | Admit: 2012-02-08 | Discharge: 2012-02-08 | Disposition: A | Discharge status: Home / Self Care | Payer: Medicare Other | Source: Ambulatory Visit | Attending: Oncology | Admitting: Oncology

## 2012-02-08 VITALS — BP 125/80 | HR 86 | Ht 65.0 in | Wt 156.8 lb

## 2012-02-08 DIAGNOSIS — I369 Nonrheumatic tricuspid valve disorder, unspecified: Secondary | ICD-10-CM

## 2012-02-08 DIAGNOSIS — C50219 Malignant neoplasm of upper-inner quadrant of unspecified female breast: Secondary | ICD-10-CM | POA: Insufficient documentation

## 2012-02-08 NOTE — Patient Instructions (Addendum)
Follow up 3 months with an echo.  Your physician has requested that you have an echocardiogram. Echocardiography is a painless test that uses sound waves to create images of your heart. It provides your doctor with information about the size and shape of your heart and how well your heart's chambers and valves are working. This procedure takes approximately one hour. There are no restrictions for this procedure.   

## 2012-02-08 NOTE — Progress Notes (Signed)
*  PRELIMINARY RESULTS* Echocardiogram 2D Echocardiogram has been performed.  Amy Kline 02/08/2012, 4:06 PM

## 2012-02-15 ENCOUNTER — Encounter: Payer: Self-pay | Admitting: Obstetrics and Gynecology

## 2012-02-15 ENCOUNTER — Ambulatory Visit (INDEPENDENT_AMBULATORY_CARE_PROVIDER_SITE_OTHER): Payer: Medicare Other | Admitting: Obstetrics and Gynecology

## 2012-02-15 VITALS — BP 134/84 | Ht 65.0 in | Wt 156.0 lb

## 2012-02-15 DIAGNOSIS — IMO0002 Reserved for concepts with insufficient information to code with codable children: Secondary | ICD-10-CM

## 2012-02-15 DIAGNOSIS — N8111 Cystocele, midline: Secondary | ICD-10-CM

## 2012-02-15 DIAGNOSIS — R232 Flushing: Secondary | ICD-10-CM | POA: Insufficient documentation

## 2012-02-15 DIAGNOSIS — R351 Nocturia: Secondary | ICD-10-CM

## 2012-02-15 DIAGNOSIS — M797 Fibromyalgia: Secondary | ICD-10-CM | POA: Insufficient documentation

## 2012-02-15 DIAGNOSIS — G4730 Sleep apnea, unspecified: Secondary | ICD-10-CM | POA: Insufficient documentation

## 2012-02-15 DIAGNOSIS — K649 Unspecified hemorrhoids: Secondary | ICD-10-CM

## 2012-02-15 DIAGNOSIS — K219 Gastro-esophageal reflux disease without esophagitis: Secondary | ICD-10-CM | POA: Insufficient documentation

## 2012-02-15 DIAGNOSIS — M899 Disorder of bone, unspecified: Secondary | ICD-10-CM

## 2012-02-15 DIAGNOSIS — C50919 Malignant neoplasm of unspecified site of unspecified female breast: Secondary | ICD-10-CM

## 2012-02-15 DIAGNOSIS — M8580 Other specified disorders of bone density and structure, unspecified site: Secondary | ICD-10-CM

## 2012-02-15 DIAGNOSIS — N952 Postmenopausal atrophic vaginitis: Secondary | ICD-10-CM

## 2012-02-15 HISTORY — DX: Reserved for concepts with insufficient information to code with codable children: IMO0002

## 2012-02-15 NOTE — Progress Notes (Signed)
The patient came to see me today for further followup. Earlier this year she was diagnosed with breast cancer and underwent mastectomy. She was started on tamoxifen but had a lot of itching and stopped it. She has an appointment to go back to the oncologist to decide  on adjunctive treatment. He had told her that if she was on tamoxifen she would need to see me more often. She is also going to start Herceptin. Last year because of a bone density showing worsening osteopenia and an elevated FRAX risk we started her on alendronate. She took it for a while but stopped it because she thought it cause some mental confusion. The confusion has disappeared. She also had been previously diagnosed with peripheral neuropathy. I sent to the neurologist who thought it was just fibromyalgia. She is having no vaginal bleeding. She is having no pelvic pain. She is up-to-date on mammograms. She has always had normal Pap smears. Her last Pap smear was May, 2012. She has hemorrhoids but they are not bleeding or causing significant discomfort. She does get up at night to go to the bathroom but does not feel it requires treatment. She does not have urgency or incontinence of urination. She does have a small cystocele.  ROS: 12 system review done. Pertinent positives above. Other positives include sleep apnea, gerd, and osteoarthritis.  Physical examination: Kennon Portela present HEENT within normal limits. Neck: Thyroid not large. No masses. Supraclavicular nodes: not enlarged. Breasts: Examined in both sitting and lying  position. No skin changes and no masses. Abdomen: Soft no guarding rebound or masses or hernia. Pelvic: External: Within normal limits. BUS: Within normal limits. Vaginal:within normal limits. Poor estrogen effect. The first degree cystocele. Cervix: clean. Uterus: Normal size and shape. Adnexa: No masses. Rectovaginal exam: Confirmatory and negative. Extremities: Within normal limits.  Assessment: #1.  Breast cancer #2. Osteopenia #3. Cystocele #4. External hemorrhoids #5. Vaginal atrophy #6. Nocturia  Plan: Once patient and the oncologist decide on her adjunctive therapy the issue of bone loss must  be readdressed. She will either do it with him or call me. She will continue yearly mammograms. Observation of other symptoms.

## 2012-02-16 LAB — URINALYSIS W MICROSCOPIC + REFLEX CULTURE
Bacteria, UA: NONE SEEN
Bilirubin Urine: NEGATIVE
Hgb urine dipstick: NEGATIVE
Protein, ur: NEGATIVE mg/dL
Urobilinogen, UA: 1 mg/dL (ref 0.0–1.0)

## 2012-02-18 ENCOUNTER — Telehealth: Payer: Self-pay | Admitting: *Deleted

## 2012-02-18 NOTE — Telephone Encounter (Signed)
Pt called to this RN today regarding concerns with being off the tamoxifen. Amy Kline states she would like to retry the tamoxifen " because I am really not sure if it was what was causing the itching ".  This RN discussed with pt her concerns with pt stating she would prefer to retry the tamoxifen " and at the first sign of anything I would stop it ".  Pt will proceed with the above and per discussion understands sign and symptoms of a reaction. Versa does have benadryl in the home.
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
## 2012-02-24 NOTE — Assessment & Plan Note (Addendum)
Role of cardio-oncology clinic reviewed at length with patient and her family. Echo images reviewed in clinic. EF currently normal.Will follow with echos every 3 months during therapy. Discussed signs and symptoms of HF and what to call us with. Will see on a routine basis.

## 2012-03-08 ENCOUNTER — Telehealth: Payer: Self-pay | Admitting: *Deleted

## 2012-03-08 ENCOUNTER — Encounter: Payer: Self-pay | Admitting: Oncology

## 2012-03-08 ENCOUNTER — Ambulatory Visit (HOSPITAL_BASED_OUTPATIENT_CLINIC_OR_DEPARTMENT_OTHER): Payer: Medicare Other | Admitting: Oncology

## 2012-03-08 VITALS — BP 143/81 | HR 92 | Temp 99.0°F | Resp 20 | Ht 65.0 in | Wt 156.9 lb

## 2012-03-08 DIAGNOSIS — C50219 Malignant neoplasm of upper-inner quadrant of unspecified female breast: Secondary | ICD-10-CM

## 2012-03-08 DIAGNOSIS — C50919 Malignant neoplasm of unspecified site of unspecified female breast: Secondary | ICD-10-CM

## 2012-03-08 DIAGNOSIS — Z170 Estrogen receptor positive status [ER+]: Secondary | ICD-10-CM

## 2012-03-08 NOTE — Telephone Encounter (Signed)
Sent Amy Kline email to set up patient treatment called dr. Delia Heady and echo will place patient on re-call list
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
## 2012-03-09 ENCOUNTER — Telehealth: Payer: Self-pay | Admitting: *Deleted

## 2012-03-09 NOTE — Telephone Encounter (Signed)
Per staff message and POF I have scheduled appts.  JMW  

## 2012-03-17 ENCOUNTER — Other Ambulatory Visit: Payer: Self-pay | Admitting: Oncology

## 2012-03-17 ENCOUNTER — Other Ambulatory Visit (HOSPITAL_BASED_OUTPATIENT_CLINIC_OR_DEPARTMENT_OTHER): Payer: Medicare Other

## 2012-03-17 ENCOUNTER — Other Ambulatory Visit: Payer: Self-pay | Admitting: *Deleted

## 2012-03-17 ENCOUNTER — Ambulatory Visit (HOSPITAL_BASED_OUTPATIENT_CLINIC_OR_DEPARTMENT_OTHER): Payer: Medicare Other

## 2012-03-17 VITALS — BP 145/73 | HR 85 | Temp 97.6°F | Resp 20

## 2012-03-17 DIAGNOSIS — C50219 Malignant neoplasm of upper-inner quadrant of unspecified female breast: Secondary | ICD-10-CM

## 2012-03-17 DIAGNOSIS — Z5112 Encounter for antineoplastic immunotherapy: Secondary | ICD-10-CM

## 2012-03-17 DIAGNOSIS — C50919 Malignant neoplasm of unspecified site of unspecified female breast: Secondary | ICD-10-CM

## 2012-03-17 DIAGNOSIS — Z170 Estrogen receptor positive status [ER+]: Secondary | ICD-10-CM

## 2012-03-17 LAB — CBC WITH DIFFERENTIAL/PLATELET
Basophils Absolute: 0.1 10*3/uL (ref 0.0–0.1)
EOS%: 3.1 % (ref 0.0–7.0)
Eosinophils Absolute: 0.3 10*3/uL (ref 0.0–0.5)
HGB: 13.2 g/dL (ref 11.6–15.9)
MCH: 31.7 pg (ref 25.1–34.0)
NEUT#: 4.7 10*3/uL (ref 1.5–6.5)
RDW: 13.2 % (ref 11.2–14.5)
lymph#: 3.5 10*3/uL — ABNORMAL HIGH (ref 0.9–3.3)

## 2012-03-17 MED ORDER — ACETAMINOPHEN 325 MG PO TABS
650.0000 mg | ORAL_TABLET | Freq: Once | ORAL | Status: AC
  Administered 2012-03-17: 650 mg via ORAL

## 2012-03-17 MED ORDER — TRASTUZUMAB CHEMO INJECTION 440 MG
6.0000 mg/kg | Freq: Once | INTRAVENOUS | Status: AC
  Administered 2012-03-17: 420 mg via INTRAVENOUS
  Filled 2012-03-17: qty 20

## 2012-03-17 MED ORDER — DIPHENHYDRAMINE HCL 25 MG PO CAPS
50.0000 mg | ORAL_CAPSULE | Freq: Once | ORAL | Status: AC
  Administered 2012-03-17: 50 mg via ORAL

## 2012-03-17 MED ORDER — SODIUM CHLORIDE 0.9 % IV SOLN
Freq: Once | INTRAVENOUS | Status: AC
  Administered 2012-03-17: 15:00:00 via INTRAVENOUS

## 2012-03-17 NOTE — Patient Instructions (Addendum)
[facility] Discharge Instructions for Patients Receiving Chemotherapy  Today you received the following chemotherapy agents Herceptin.  To help prevent nausea and vomiting after your treatment, we encourage you to take your nausea medication as ordered per MD.    If you develop nausea and vomiting that is not controlled by your nausea medication, call the clinic. If it is after clinic hours your family physician or the after hours number for the clinic or go to the Emergency Department.   BELOW ARE SYMPTOMS THAT SHOULD BE REPORTED IMMEDIATELY:  *FEVER GREATER THAN 100.5 F  *CHILLS WITH OR WITHOUT FEVER  NAUSEA AND VOMITING THAT IS NOT CONTROLLED WITH YOUR NAUSEA MEDICATION  *UNUSUAL SHORTNESS OF BREATH  *UNUSUAL BRUISING OR BLEEDING  TENDERNESS IN MOUTH AND THROAT WITH OR WITHOUT PRESENCE OF ULCERS  *URINARY PROBLEMS  *BOWEL PROBLEMS  UNUSUAL RASH Items with * indicate a potential emergency and should be followed up as soon as possible.  One of the nurses will contact you 24 hours after your treatment. Please let the nurse know about any problems that you may have experienced. Feel free to call the clinic you have any questions or concerns. The clinic phone number is [phone].   I have been informed and understand all the instructions given to me. I know to contact the clinic, my physician, or go to the Emergency Department if any problems should occur. I do not have any questions at this time, but understand that I may call the clinic during office hours   should I have any questions or need assistance in obtaining follow up care.    __________________________________________  _____________  __________ Signature of Patient or Authorized Representative            Date                   Time    __________________________________________ Nurse's Signature    

## 2012-03-18 ENCOUNTER — Other Ambulatory Visit: Payer: Self-pay | Admitting: *Deleted

## 2012-03-18 ENCOUNTER — Telehealth: Payer: Self-pay | Admitting: *Deleted

## 2012-03-18 NOTE — Telephone Encounter (Signed)
Spoke with Ms. Oglesby who says she is "not feeling good and had a terrible night".  Reports feeling "tired, sleepy after benadryl but not able to sleep.  Felt like a bad case of the flu.  Pain all over my body from the hair on my head down to my toes.  I feel exhausted, headache and tinges of nausea.  I ate the snacks there and a half a milk shake last night and i made myself eat an egg today.  Felt cold during treatment and last evening felt cold at home.  Wore a winter robe and heating pad feeling extremely cold.  Denies fever but did not check temp with this event.  Asked if she may take the tylenol #3 for her pain.  Pain was severe last night and moderate today.  Asked how long she'll feel this way and the percentage of people who experienced the adverse reactions/side effects.     Answered questions and expressed she can discuss this further with provider at f/u.  Says other people have gotten through this and she is going to try to continue receiving the herceptin treatment.

## 2012-03-18 NOTE — Telephone Encounter (Signed)
Message copied by Augusto Garbe on Fri Mar 18, 2012  1:24 PM ------      Message from: Melodie Bouillon R      Created: Thu Mar 17, 2012  4:18 PM      Regarding: Chemo f/u call      Contact: [phone]       First time Herceptin.  Dr. Darnelle Catalan.

## 2012-03-21 ENCOUNTER — Telehealth: Payer: Self-pay | Admitting: *Deleted

## 2012-03-21 NOTE — Telephone Encounter (Signed)
Per orders from midlevel has no afternoon appointments

## 2012-04-07 ENCOUNTER — Ambulatory Visit (HOSPITAL_BASED_OUTPATIENT_CLINIC_OR_DEPARTMENT_OTHER): Payer: Medicare Other | Admitting: Physician Assistant

## 2012-04-07 ENCOUNTER — Other Ambulatory Visit (HOSPITAL_BASED_OUTPATIENT_CLINIC_OR_DEPARTMENT_OTHER): Payer: Medicare Other | Admitting: Lab

## 2012-04-07 ENCOUNTER — Telehealth: Payer: Self-pay | Admitting: *Deleted

## 2012-04-07 ENCOUNTER — Ambulatory Visit (HOSPITAL_BASED_OUTPATIENT_CLINIC_OR_DEPARTMENT_OTHER): Payer: Medicare Other

## 2012-04-07 ENCOUNTER — Encounter: Payer: Self-pay | Admitting: Physician Assistant

## 2012-04-07 VITALS — BP 139/80 | HR 73 | Temp 97.9°F | Resp 20 | Ht 65.0 in | Wt 159.3 lb

## 2012-04-07 DIAGNOSIS — C50219 Malignant neoplasm of upper-inner quadrant of unspecified female breast: Secondary | ICD-10-CM

## 2012-04-07 DIAGNOSIS — C50919 Malignant neoplasm of unspecified site of unspecified female breast: Secondary | ICD-10-CM

## 2012-04-07 DIAGNOSIS — Z170 Estrogen receptor positive status [ER+]: Secondary | ICD-10-CM

## 2012-04-07 DIAGNOSIS — Z5112 Encounter for antineoplastic immunotherapy: Secondary | ICD-10-CM

## 2012-04-07 DIAGNOSIS — K219 Gastro-esophageal reflux disease without esophagitis: Secondary | ICD-10-CM

## 2012-04-07 LAB — CBC WITH DIFFERENTIAL/PLATELET
Eosinophils Absolute: 0.3 10*3/uL (ref 0.0–0.5)
LYMPH%: 37.2 % (ref 14.0–49.7)
MONO#: 0.9 10*3/uL (ref 0.1–0.9)
NEUT#: 3.4 10*3/uL (ref 1.5–6.5)
Platelets: 313 10*3/uL (ref 145–400)
RBC: 4.17 10*6/uL (ref 3.70–5.45)
WBC: 7.4 10*3/uL (ref 3.9–10.3)
nRBC: 0 % (ref 0–0)

## 2012-04-07 MED ORDER — ACETAMINOPHEN 325 MG PO TABS
650.0000 mg | ORAL_TABLET | Freq: Once | ORAL | Status: AC
  Administered 2012-04-07: 650 mg via ORAL

## 2012-04-07 MED ORDER — HEPARIN SOD (PORK) LOCK FLUSH 100 UNIT/ML IV SOLN
500.0000 [IU] | Freq: Once | INTRAVENOUS | Status: DC | PRN
  Filled 2012-04-07: qty 5

## 2012-04-07 MED ORDER — ESOMEPRAZOLE MAGNESIUM 40 MG PO CPDR: 40.0000 mg | DELAYED_RELEASE_CAPSULE | Freq: Every evening | ORAL | Status: DC

## 2012-04-07 MED ORDER — TRASTUZUMAB CHEMO INJECTION 440 MG
6.0000 mg/kg | Freq: Once | INTRAVENOUS | Status: AC
  Administered 2012-04-07: 420 mg via INTRAVENOUS
  Filled 2012-04-07: qty 20

## 2012-04-07 MED ORDER — SODIUM CHLORIDE 0.9 % IV SOLN
Freq: Once | INTRAVENOUS | Status: AC
  Administered 2012-04-07: 12:00:00 via INTRAVENOUS

## 2012-04-07 MED ORDER — SODIUM CHLORIDE 0.9 % IJ SOLN
10.0000 mL | INTRAMUSCULAR | Status: DC | PRN
  Filled 2012-04-07: qty 10

## 2012-04-07 MED ORDER — DIPHENHYDRAMINE HCL 25 MG PO CAPS
50.0000 mg | ORAL_CAPSULE | Freq: Once | ORAL | Status: AC
  Administered 2012-04-07: 50 mg via ORAL
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
## 2012-04-07 NOTE — Patient Instructions (Signed)
[facility] Discharge Instructions for Patients Receiving Chemotherapy  Today you received the following chemotherapy agents Herceptin.  To help prevent nausea and vomiting after your treatment, we encourage you to take your nausea medication.   If you develop nausea and vomiting that is not controlled by your nausea medication, call the clinic. If it is after clinic hours your family physician or the after hours number for the clinic or go to the Emergency Department.   BELOW ARE SYMPTOMS THAT SHOULD BE REPORTED IMMEDIATELY:  *FEVER GREATER THAN 100.5 F  *CHILLS WITH OR WITHOUT FEVER  NAUSEA AND VOMITING THAT IS NOT CONTROLLED WITH YOUR NAUSEA MEDICATION  *UNUSUAL SHORTNESS OF BREATH  *UNUSUAL BRUISING OR BLEEDING  TENDERNESS IN MOUTH AND THROAT WITH OR WITHOUT PRESENCE OF ULCERS  *URINARY PROBLEMS  *BOWEL PROBLEMS  UNUSUAL RASH Items with * indicate a potential emergency and should be followed up as soon as possible.  One of the nurses will contact you 24 hours after your treatment. Please let the nurse know about any problems that you may have experienced. Feel free to call the clinic you have any questions or concerns. The clinic phone number is [phone].   I have been informed and understand all the instructions given to me. I know to contact the clinic, my physician, or go to the Emergency Department if any problems should occur. I do not have any questions at this time, but understand that I may call the clinic during office hours   should I have any questions or need assistance in obtaining follow up care.    __________________________________________  _____________  __________ Signature of Patient or Authorized Representative            Date                   Time    __________________________________________ Nurse's Signature    

## 2012-04-07 NOTE — Telephone Encounter (Signed)
Per staff message and POF I have scheduled appts. JWM  

## 2012-04-07 NOTE — Patient Instructions (Signed)
Continue tamoxifen as before  Restart Nexium - given refill  Continue Herceptin every 3 weeks.  Prescription for Compazine (prochlorperazine) to take every 6 hrs for first few days after treatment if needed for nausea.  Also try taking Claritin day of treatment and 2-3 days after for bone pain. Take Tylenol or Aleve or Ibuprofen as needed as well.  CALL us if side effect continue after treatment.   [phone]

## 2012-04-07 NOTE — Telephone Encounter (Signed)
Echo and Bensimhon in mid Nov; lab and Herceptin 11/7; lab, GM and Herceptin 12/5 (Per pt request - skip treatment week of Thanksgiving); Patient prefers PM appts  Sent email michelle to set up treatment

## 2012-04-28 ENCOUNTER — Ambulatory Visit (HOSPITAL_BASED_OUTPATIENT_CLINIC_OR_DEPARTMENT_OTHER): Payer: Medicare Other

## 2012-04-28 ENCOUNTER — Other Ambulatory Visit (HOSPITAL_BASED_OUTPATIENT_CLINIC_OR_DEPARTMENT_OTHER): Payer: Medicare Other

## 2012-04-28 ENCOUNTER — Ambulatory Visit (HOSPITAL_BASED_OUTPATIENT_CLINIC_OR_DEPARTMENT_OTHER): Payer: Medicare Other | Admitting: Physician Assistant

## 2012-04-28 ENCOUNTER — Encounter: Payer: Self-pay | Admitting: Physician Assistant

## 2012-04-28 VITALS — BP 155/87 | HR 89 | Temp 97.5°F | Resp 20 | Ht 65.0 in | Wt 158.8 lb

## 2012-04-28 DIAGNOSIS — C50219 Malignant neoplasm of upper-inner quadrant of unspecified female breast: Secondary | ICD-10-CM

## 2012-04-28 DIAGNOSIS — Z5112 Encounter for antineoplastic immunotherapy: Secondary | ICD-10-CM

## 2012-04-28 DIAGNOSIS — M899 Disorder of bone, unspecified: Secondary | ICD-10-CM

## 2012-04-28 DIAGNOSIS — M949 Disorder of cartilage, unspecified: Secondary | ICD-10-CM

## 2012-04-28 DIAGNOSIS — M8580 Other specified disorders of bone density and structure, unspecified site: Secondary | ICD-10-CM

## 2012-04-28 LAB — CBC WITH DIFFERENTIAL/PLATELET
BASO%: 1.2 % (ref 0.0–2.0)
Basophils Absolute: 0.1 10*3/uL (ref 0.0–0.1)
EOS%: 3.6 % (ref 0.0–7.0)
HCT: 37.9 % (ref 34.8–46.6)
HGB: 13.1 g/dL (ref 11.6–15.9)
MCH: 31.8 pg (ref 25.1–34.0)
MONO#: 1 10*3/uL — ABNORMAL HIGH (ref 0.1–0.9)
NEUT#: 3.7 10*3/uL (ref 1.5–6.5)
RDW: 13.3 % (ref 11.2–14.5)
WBC: 8.5 10*3/uL (ref 3.9–10.3)
lymph#: 3.4 10*3/uL — ABNORMAL HIGH (ref 0.9–3.3)

## 2012-04-28 MED ORDER — DIPHENHYDRAMINE HCL 25 MG PO CAPS
50.0000 mg | ORAL_CAPSULE | Freq: Once | ORAL | Status: AC
  Administered 2012-04-28: 50 mg via ORAL

## 2012-04-28 MED ORDER — TRASTUZUMAB CHEMO INJECTION 440 MG
6.0000 mg/kg | Freq: Once | INTRAVENOUS | Status: AC
  Administered 2012-04-28: 420 mg via INTRAVENOUS
  Filled 2012-04-28: qty 20

## 2012-04-28 MED ORDER — ACETAMINOPHEN 325 MG PO TABS
650.0000 mg | ORAL_TABLET | Freq: Once | ORAL | Status: AC
  Administered 2012-04-28: 650 mg via ORAL

## 2012-04-28 MED ORDER — SODIUM CHLORIDE 0.9 % IV SOLN
Freq: Once | INTRAVENOUS | Status: AC
  Administered 2012-04-28: 14:00:00 via INTRAVENOUS
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
## 2012-05-19 ENCOUNTER — Other Ambulatory Visit (HOSPITAL_BASED_OUTPATIENT_CLINIC_OR_DEPARTMENT_OTHER): Payer: Medicare Other | Admitting: Lab

## 2012-05-19 ENCOUNTER — Ambulatory Visit (HOSPITAL_BASED_OUTPATIENT_CLINIC_OR_DEPARTMENT_OTHER): Payer: Medicare Other

## 2012-05-19 VITALS — BP 133/66 | HR 83 | Temp 98.1°F

## 2012-05-19 DIAGNOSIS — Z5112 Encounter for antineoplastic immunotherapy: Secondary | ICD-10-CM

## 2012-05-19 DIAGNOSIS — C50919 Malignant neoplasm of unspecified site of unspecified female breast: Secondary | ICD-10-CM

## 2012-05-19 DIAGNOSIS — C50219 Malignant neoplasm of upper-inner quadrant of unspecified female breast: Secondary | ICD-10-CM

## 2012-05-19 LAB — CBC WITH DIFFERENTIAL/PLATELET
Basophils Absolute: 0.1 10*3/uL (ref 0.0–0.1)
Eosinophils Absolute: 0.3 10*3/uL (ref 0.0–0.5)
HGB: 12.9 g/dL (ref 11.6–15.9)
MONO#: 1.6 10*3/uL — ABNORMAL HIGH (ref 0.1–0.9)
NEUT#: 6.5 10*3/uL (ref 1.5–6.5)
Platelets: 275 10*3/uL (ref 145–400)
RBC: 4.03 10*6/uL (ref 3.70–5.45)
RDW: 13.3 % (ref 11.2–14.5)
WBC: 11 10*3/uL — ABNORMAL HIGH (ref 3.9–10.3)
nRBC: 0 % (ref 0–0)

## 2012-05-19 MED ORDER — DIPHENHYDRAMINE HCL 25 MG PO CAPS
50.0000 mg | ORAL_CAPSULE | Freq: Once | ORAL | Status: AC
  Administered 2012-05-19: 50 mg via ORAL

## 2012-05-19 MED ORDER — SODIUM CHLORIDE 0.9 % IV SOLN
Freq: Once | INTRAVENOUS | Status: AC
  Administered 2012-05-19: 14:00:00 via INTRAVENOUS

## 2012-05-19 MED ORDER — TRASTUZUMAB CHEMO INJECTION 440 MG
6.0000 mg/kg | Freq: Once | INTRAVENOUS | Status: AC
  Administered 2012-05-19: 420 mg via INTRAVENOUS
  Filled 2012-05-19: qty 20

## 2012-05-19 MED ORDER — ACETAMINOPHEN 325 MG PO TABS
650.0000 mg | ORAL_TABLET | Freq: Once | ORAL | Status: AC
  Administered 2012-05-19: 650 mg via ORAL

## 2012-05-19 NOTE — Patient Instructions (Signed)
[facility] Discharge Instructions for Patients Receiving Chemotherapy  Today you received the following chemotherapy agents Herceptin.      BELOW ARE SYMPTOMS THAT SHOULD BE REPORTED IMMEDIATELY:  *FEVER GREATER THAN 100.5 F  *CHILLS WITH OR WITHOUT FEVER  NAUSEA AND VOMITING THAT IS NOT CONTROLLED WITH YOUR NAUSEA MEDICATION  *UNUSUAL SHORTNESS OF BREATH  *UNUSUAL BRUISING OR BLEEDING  TENDERNESS IN MOUTH AND THROAT WITH OR WITHOUT PRESENCE OF ULCERS  *URINARY PROBLEMS  *BOWEL PROBLEMS  UNUSUAL RASH Items with * indicate a potential emergency and should be followed up as soon as possible.  Feel free to call the clinic you have any questions or concerns. The clinic phone number is [phone].    

## 2012-05-19 NOTE — Progress Notes (Signed)
Pt reports being "swimmy headed" sometimes right after treatment.    Pt denies headaches, blurred/double vision, or dizziness.  Pt states this does not happen all the time.  Informed pt this could be from the benadryl received, but she should call office if symptoms persist or worsen or she develops any of the other above symptoms mentioned.  Pt verbalizes understanding.

## 2012-05-30 ENCOUNTER — Ambulatory Visit (HOSPITAL_BASED_OUTPATIENT_CLINIC_OR_DEPARTMENT_OTHER)
Admission: RE | Admit: 2012-05-30 | Discharge: 2012-05-30 | Disposition: A | Discharge status: Home / Self Care | Payer: Medicare Other | Source: Ambulatory Visit | Attending: Internal Medicine | Admitting: Internal Medicine

## 2012-05-30 ENCOUNTER — Ambulatory Visit (HOSPITAL_COMMUNITY)
Admission: RE | Admit: 2012-05-30 | Discharge: 2012-05-30 | Disposition: A | Discharge status: Home / Self Care | Payer: Medicare Other | Source: Ambulatory Visit | Attending: Internal Medicine | Admitting: Internal Medicine

## 2012-05-30 VITALS — BP 146/82 | HR 78 | Wt 160.8 lb

## 2012-05-30 DIAGNOSIS — C50219 Malignant neoplasm of upper-inner quadrant of unspecified female breast: Secondary | ICD-10-CM

## 2012-05-30 DIAGNOSIS — Z09 Encounter for follow-up examination after completed treatment for conditions other than malignant neoplasm: Secondary | ICD-10-CM

## 2012-05-30 DIAGNOSIS — C50919 Malignant neoplasm of unspecified site of unspecified female breast: Secondary | ICD-10-CM | POA: Insufficient documentation

## 2012-05-30 MED ORDER — CARVEDILOL 3.125 MG PO TABS: 3.1250 mg | ORAL_TABLET | Freq: Two times a day (BID) | ORAL | Status: DC

## 2012-05-30 NOTE — Patient Instructions (Addendum)
Start Carvedilol 3.125 mg Twice daily   We will contact you in the 1st of January to schedule your next appointment.

## 2012-05-30 NOTE — Assessment & Plan Note (Addendum)
Doing well clinically. Initial echo today with inadequate lateral s' windows. Images reviewed and we went back and obtained more images. EF appears stable but lateral s' appears slightly reduced from previous. Will continue Herceptin but will add carvedilol 3.125 mg twice a day. Follow up in 2 months with an ECHO. Discussed HF symptoms to watch for.

## 2012-05-30 NOTE — Progress Notes (Signed)
  Echocardiogram 2D Echocardiogram has been performed.  Jorje Guild 05/30/2012, 12:27 PM
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
## 2012-06-16 ENCOUNTER — Ambulatory Visit (HOSPITAL_BASED_OUTPATIENT_CLINIC_OR_DEPARTMENT_OTHER): Payer: Medicare Other | Admitting: Oncology

## 2012-06-16 ENCOUNTER — Telehealth: Payer: Self-pay | Admitting: *Deleted

## 2012-06-16 ENCOUNTER — Other Ambulatory Visit (HOSPITAL_BASED_OUTPATIENT_CLINIC_OR_DEPARTMENT_OTHER): Payer: Medicare Other | Admitting: Lab

## 2012-06-16 ENCOUNTER — Ambulatory Visit (HOSPITAL_BASED_OUTPATIENT_CLINIC_OR_DEPARTMENT_OTHER): Payer: Medicare Other

## 2012-06-16 VITALS — BP 152/86 | HR 81 | Temp 97.2°F | Resp 20 | Ht 65.0 in | Wt 160.1 lb

## 2012-06-16 DIAGNOSIS — C50219 Malignant neoplasm of upper-inner quadrant of unspecified female breast: Secondary | ICD-10-CM

## 2012-06-16 DIAGNOSIS — Z5112 Encounter for antineoplastic immunotherapy: Secondary | ICD-10-CM

## 2012-06-16 DIAGNOSIS — M899 Disorder of bone, unspecified: Secondary | ICD-10-CM

## 2012-06-16 DIAGNOSIS — M949 Disorder of cartilage, unspecified: Secondary | ICD-10-CM

## 2012-06-16 DIAGNOSIS — Z170 Estrogen receptor positive status [ER+]: Secondary | ICD-10-CM

## 2012-06-16 DIAGNOSIS — K219 Gastro-esophageal reflux disease without esophagitis: Secondary | ICD-10-CM

## 2012-06-16 LAB — CBC WITH DIFFERENTIAL/PLATELET
BASO%: 1.1 % (ref 0.0–2.0)
EOS%: 4 % (ref 0.0–7.0)
HCT: 38 % (ref 34.8–46.6)
MCH: 31.7 pg (ref 25.1–34.0)
MCHC: 34.5 g/dL (ref 31.5–36.0)
MONO#: 1 10*3/uL — ABNORMAL HIGH (ref 0.1–0.9)
NEUT%: 51.5 % (ref 38.4–76.8)
RBC: 4.13 10*6/uL (ref 3.70–5.45)
RDW: 13.2 % (ref 11.2–14.5)
WBC: 9 10*3/uL (ref 3.9–10.3)
lymph#: 2.9 10*3/uL (ref 0.9–3.3)
nRBC: 0 % (ref 0–0)

## 2012-06-16 MED ORDER — DIPHENHYDRAMINE HCL 25 MG PO CAPS
50.0000 mg | ORAL_CAPSULE | Freq: Once | ORAL | Status: AC
  Administered 2012-06-16: 50 mg via ORAL

## 2012-06-16 MED ORDER — TRASTUZUMAB CHEMO INJECTION 440 MG
6.0000 mg/kg | Freq: Once | INTRAVENOUS | Status: AC
  Administered 2012-06-16: 420 mg via INTRAVENOUS
  Filled 2012-06-16: qty 20

## 2012-06-16 MED ORDER — SODIUM CHLORIDE 0.9 % IV SOLN
Freq: Once | INTRAVENOUS | Status: AC
  Administered 2012-06-16: 12:00:00 via INTRAVENOUS

## 2012-06-16 MED ORDER — ACETAMINOPHEN 325 MG PO TABS
650.0000 mg | ORAL_TABLET | Freq: Once | ORAL | Status: AC
  Administered 2012-06-16: 650 mg via ORAL
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
## 2012-06-16 NOTE — Telephone Encounter (Signed)
Per staff message and POF I have scheduled appt.  JMW  

## 2012-06-16 NOTE — Telephone Encounter (Signed)
Sent Amy Kline email to set up treatment on 07-28-2012

## 2012-06-16 NOTE — Patient Instructions (Signed)
[facility] Discharge Instructions for Patients Receiving Chemotherapy  Today you received the following chemotherapy agents: herceptin  To help prevent nausea and vomiting after your treatment, we encourage you to take your nausea medication.  Take it as often as prescribed.     If you develop nausea and vomiting that is not controlled by your nausea medication, call the clinic. If it is after clinic hours your family physician or the after hours number for the clinic or go to the Emergency Department.   BELOW ARE SYMPTOMS THAT SHOULD BE REPORTED IMMEDIATELY:  *FEVER GREATER THAN 100.5 F  *CHILLS WITH OR WITHOUT FEVER  NAUSEA AND VOMITING THAT IS NOT CONTROLLED WITH YOUR NAUSEA MEDICATION  *UNUSUAL SHORTNESS OF BREATH  *UNUSUAL BRUISING OR BLEEDING  TENDERNESS IN MOUTH AND THROAT WITH OR WITHOUT PRESENCE OF ULCERS  *URINARY PROBLEMS  *BOWEL PROBLEMS  UNUSUAL RASH Items with * indicate a potential emergency and should be followed up as soon as possible.  Feel free to call the clinic you have any questions or concerns. The clinic phone number is [phone].   I have been informed and understand all the instructions given to me. I know to contact the clinic, my physician, or go to the Emergency Department if any problems should occur. I do not have any questions at this time, but understand that I may call the clinic during office hours   should I have any questions or need assistance in obtaining follow up care.    __________________________________________  _____________  __________ Signature of Patient or Authorized Representative            Date                   Time    __________________________________________ Nurse's Signature    

## 2012-06-16 NOTE — Telephone Encounter (Signed)
Gave patient appointment for 07-29-2011 starting at 9:30am

## 2012-07-11 ENCOUNTER — Other Ambulatory Visit: Payer: Self-pay | Admitting: *Deleted

## 2012-07-11 DIAGNOSIS — C50219 Malignant neoplasm of upper-inner quadrant of unspecified female breast: Secondary | ICD-10-CM

## 2012-07-15 ENCOUNTER — Other Ambulatory Visit: Payer: Self-pay | Admitting: Oncology

## 2012-07-20 ENCOUNTER — Ambulatory Visit (HOSPITAL_COMMUNITY)
Admission: RE | Admit: 2012-07-20 | Discharge: 2012-07-20 | Disposition: A | Discharge status: Home / Self Care | Payer: Medicare Other | Source: Ambulatory Visit | Attending: Internal Medicine | Admitting: Internal Medicine

## 2012-07-20 ENCOUNTER — Telehealth (HOSPITAL_COMMUNITY): Payer: Self-pay | Admitting: Adult Health

## 2012-07-20 VITALS — BP 136/86 | HR 77 | Wt 161.5 lb

## 2012-07-20 DIAGNOSIS — C50219 Malignant neoplasm of upper-inner quadrant of unspecified female breast: Secondary | ICD-10-CM | POA: Insufficient documentation

## 2012-07-20 DIAGNOSIS — C50919 Malignant neoplasm of unspecified site of unspecified female breast: Secondary | ICD-10-CM

## 2012-07-20 NOTE — Telephone Encounter (Signed)
Provided ECHO results. ECHO stable.

## 2012-07-20 NOTE — Assessment & Plan Note (Addendum)
Dr Gala Romney reviewed ECHO. EF and lateral S' stable. No evidence of cardiotoxicity. Continue Carvedilol 3.125 mg twice a day. Stable to continue Herceptin. Follow up in 3 months with an ECHO.

## 2012-07-20 NOTE — Patient Instructions (Addendum)
Follow up in 3 months with Dr Gala Romney and an ECHO

## 2012-07-20 NOTE — Progress Notes (Signed)
  Echocardiogram 2D Echocardiogram has been performed.  Amy Kline 07/20/2012, 12:57 PM
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
## 2012-07-28 ENCOUNTER — Telehealth: Payer: Self-pay | Admitting: *Deleted

## 2012-07-28 ENCOUNTER — Ambulatory Visit (HOSPITAL_BASED_OUTPATIENT_CLINIC_OR_DEPARTMENT_OTHER): Payer: Medicare Other | Admitting: Oncology

## 2012-07-28 ENCOUNTER — Other Ambulatory Visit (HOSPITAL_BASED_OUTPATIENT_CLINIC_OR_DEPARTMENT_OTHER): Payer: Medicare Other | Admitting: Lab

## 2012-07-28 ENCOUNTER — Ambulatory Visit (HOSPITAL_BASED_OUTPATIENT_CLINIC_OR_DEPARTMENT_OTHER): Payer: Medicare Other

## 2012-07-28 VITALS — BP 162/86 | HR 82 | Temp 96.9°F | Resp 20 | Ht 65.0 in | Wt 162.6 lb

## 2012-07-28 DIAGNOSIS — C50219 Malignant neoplasm of upper-inner quadrant of unspecified female breast: Secondary | ICD-10-CM

## 2012-07-28 DIAGNOSIS — Z170 Estrogen receptor positive status [ER+]: Secondary | ICD-10-CM

## 2012-07-28 DIAGNOSIS — K219 Gastro-esophageal reflux disease without esophagitis: Secondary | ICD-10-CM

## 2012-07-28 DIAGNOSIS — Z9010 Acquired absence of unspecified breast and nipple: Secondary | ICD-10-CM

## 2012-07-28 DIAGNOSIS — Z5112 Encounter for antineoplastic immunotherapy: Secondary | ICD-10-CM

## 2012-07-28 DIAGNOSIS — M899 Disorder of bone, unspecified: Secondary | ICD-10-CM

## 2012-07-28 LAB — CBC WITH DIFFERENTIAL/PLATELET
EOS%: 4.2 % (ref 0.0–7.0)
Eosinophils Absolute: 0.4 10*3/uL (ref 0.0–0.5)
HGB: 13.8 g/dL (ref 11.6–15.9)
MCV: 91.6 fL (ref 79.5–101.0)
MONO%: 13.5 % (ref 0.0–14.0)
NEUT#: 4.8 10*3/uL (ref 1.5–6.5)
RBC: 4.41 10*6/uL (ref 3.70–5.45)
RDW: 13.5 % (ref 11.2–14.5)
lymph#: 2.7 10*3/uL (ref 0.9–3.3)
nRBC: 0 % (ref 0–0)

## 2012-07-28 MED ORDER — TRASTUZUMAB CHEMO INJECTION 440 MG
6.0000 mg/kg | Freq: Once | INTRAVENOUS | Status: AC
  Administered 2012-07-28: 420 mg via INTRAVENOUS
  Filled 2012-07-28: qty 20

## 2012-07-28 MED ORDER — DIPHENHYDRAMINE HCL 25 MG PO CAPS
50.0000 mg | ORAL_CAPSULE | Freq: Once | ORAL | Status: AC
  Administered 2012-07-28: 50 mg via ORAL

## 2012-07-28 MED ORDER — ACETAMINOPHEN 325 MG PO TABS
650.0000 mg | ORAL_TABLET | Freq: Once | ORAL | Status: AC
  Administered 2012-07-28: 650 mg via ORAL

## 2012-07-28 MED ORDER — SODIUM CHLORIDE 0.9 % IV SOLN: Freq: Once | INTRAVENOUS | Status: DC

## 2012-07-28 NOTE — Telephone Encounter (Signed)
herceptin today and every 3 weeks throough mid-september; labs each treatment  Sent michelle email to set up treatment

## 2012-07-28 NOTE — Patient Instructions (Addendum)
[facility] Discharge Instructions for Patients Receiving Chemotherapy  Today you received the following chemotherapy agents:  Herceptin To help prevent nausea and vomiting after your treatment, we encourage you to take your nausea medicationIf you develop nausea and vomiting that is not controlled by your nausea medication, call the clinic. If it is after clinic hours your family physician or the after hours number for the clinic or go to the Emergency Department.   BELOW ARE SYMPTOMS THAT SHOULD BE REPORTED IMMEDIATELY:  *FEVER GREATER THAN 100.5 F  *CHILLS WITH OR WITHOUT FEVER  NAUSEA AND VOMITING THAT IS NOT CONTROLLED WITH YOUR NAUSEA MEDICATION  *UNUSUAL SHORTNESS OF BREATH  *UNUSUAL BRUISING OR BLEEDING  TENDERNESS IN MOUTH AND THROAT WITH OR WITHOUT PRESENCE OF ULCERS  *URINARY PROBLEMS  *BOWEL PROBLEMS  UNUSUAL RASH Items with * indicate a potential emergency and should be followed up as soon as possible. Please let the nurse know about any problems that you may have experienced. Feel free to call the clinic you have any questions or concerns. The clinic phone number is [phone].   I have been informed and understand all the instructions given to me. I know to contact the clinic, my physician, or go to the Emergency Department if any problems should occur. I do not have any questions at this time, but understand that I may call the clinic during office hours   should I have any questions or need assistance in obtaining follow up care.    __________________________________________  _____________  __________ Signature of Patient or Authorized Representative            Date                   Time    __________________________________________ Nurse's Signature

## 2012-07-28 NOTE — Telephone Encounter (Signed)
Per staff message and POF I have scheduled appts and gave patient schedule.  JMW
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
## 2012-08-01 ENCOUNTER — Other Ambulatory Visit: Payer: Self-pay | Admitting: *Deleted

## 2012-08-01 ENCOUNTER — Ambulatory Visit (HOSPITAL_COMMUNITY)
Admission: RE | Admit: 2012-08-01 | Discharge: 2012-08-01 | Disposition: A | Discharge status: Home / Self Care | Payer: Medicare Other | Source: Ambulatory Visit | Attending: Oncology | Admitting: Oncology

## 2012-08-01 DIAGNOSIS — M7989 Other specified soft tissue disorders: Secondary | ICD-10-CM | POA: Insufficient documentation

## 2012-08-01 DIAGNOSIS — M79609 Pain in unspecified limb: Secondary | ICD-10-CM

## 2012-08-01 DIAGNOSIS — C50219 Malignant neoplasm of upper-inner quadrant of unspecified female breast: Secondary | ICD-10-CM

## 2012-08-01 DIAGNOSIS — C50919 Malignant neoplasm of unspecified site of unspecified female breast: Secondary | ICD-10-CM | POA: Insufficient documentation

## 2012-08-01 NOTE — Progress Notes (Signed)
VASCULAR LAB PRELIMINARY  PRELIMINARY  PRELIMINARY  PRELIMINARY  Right lower extremity venous duplex completed.    Preliminary report:  Right:  No evidence of DVT, superficial thrombosis, or Baker's cyst.  Charlissa Petros, RVS 08/01/2012, 12:16 PM

## 2012-08-17 ENCOUNTER — Telehealth: Payer: Self-pay | Admitting: Emergency Medicine

## 2012-08-17 NOTE — Telephone Encounter (Signed)
Spoke with patient; she states that she has been having intermittent bilateral lower extremity swelling; States right greater than left. States that this was worse after Herceptin infusion. Patient has been to seen by Dr.Bensimhon and has had Herceptin held in the past due to cardiac issues.   Patient instructed to see nurse prior to treatment on 2/6.

## 2012-08-18 ENCOUNTER — Other Ambulatory Visit (HOSPITAL_BASED_OUTPATIENT_CLINIC_OR_DEPARTMENT_OTHER): Payer: Medicare Other | Admitting: Lab

## 2012-08-18 ENCOUNTER — Ambulatory Visit (HOSPITAL_BASED_OUTPATIENT_CLINIC_OR_DEPARTMENT_OTHER): Payer: Medicare Other

## 2012-08-18 VITALS — BP 141/79 | HR 77 | Temp 96.7°F | Resp 20

## 2012-08-18 DIAGNOSIS — C50219 Malignant neoplasm of upper-inner quadrant of unspecified female breast: Secondary | ICD-10-CM

## 2012-08-18 DIAGNOSIS — C50919 Malignant neoplasm of unspecified site of unspecified female breast: Secondary | ICD-10-CM

## 2012-08-18 DIAGNOSIS — Z5112 Encounter for antineoplastic immunotherapy: Secondary | ICD-10-CM

## 2012-08-18 LAB — CBC WITH DIFFERENTIAL/PLATELET
BASO%: 1.1 % (ref 0.0–2.0)
Eosinophils Absolute: 0.4 10*3/uL (ref 0.0–0.5)
HCT: 39.6 % (ref 34.8–46.6)
MCHC: 34.6 g/dL (ref 31.5–36.0)
MONO#: 1.1 10*3/uL — ABNORMAL HIGH (ref 0.1–0.9)
NEUT#: 5.1 10*3/uL (ref 1.5–6.5)
RBC: 4.3 10*6/uL (ref 3.70–5.45)
WBC: 9.8 10*3/uL (ref 3.9–10.3)
lymph#: 3.1 10*3/uL (ref 0.9–3.3)
nRBC: 0 % (ref 0–0)

## 2012-08-18 LAB — TECHNOLOGIST REVIEW

## 2012-08-18 MED ORDER — SODIUM CHLORIDE 0.9 % IV SOLN
Freq: Once | INTRAVENOUS | Status: AC
  Administered 2012-08-18: 12:00:00 via INTRAVENOUS

## 2012-08-18 MED ORDER — DIPHENHYDRAMINE HCL 25 MG PO CAPS
50.0000 mg | ORAL_CAPSULE | Freq: Once | ORAL | Status: AC
  Administered 2012-08-18: 50 mg via ORAL

## 2012-08-18 MED ORDER — ACETAMINOPHEN 325 MG PO TABS
650.0000 mg | ORAL_TABLET | Freq: Once | ORAL | Status: AC
  Administered 2012-08-18: 650 mg via ORAL

## 2012-08-18 MED ORDER — TRASTUZUMAB CHEMO INJECTION 440 MG
6.0000 mg/kg | Freq: Once | INTRAVENOUS | Status: AC
  Administered 2012-08-18: 420 mg via INTRAVENOUS
  Filled 2012-08-18: qty 20

## 2012-08-18 NOTE — Patient Instructions (Signed)
[facility] Discharge Instructions for Patients Receiving Chemotherapy  Today you received the following chemotherapy agents :  Herceptin.  To help prevent nausea and vomiting after your treatment, we encourage you to take your nausea medication as instructed by your physician.    If you develop nausea and vomiting that is not controlled by your nausea medication, call the clinic. If it is after clinic hours your family physician or the after hours number for the clinic or go to the Emergency Department.   BELOW ARE SYMPTOMS THAT SHOULD BE REPORTED IMMEDIATELY:  *FEVER GREATER THAN 100.5 F  *CHILLS WITH OR WITHOUT FEVER  NAUSEA AND VOMITING THAT IS NOT CONTROLLED WITH YOUR NAUSEA MEDICATION  *UNUSUAL SHORTNESS OF BREATH  *UNUSUAL BRUISING OR BLEEDING  TENDERNESS IN MOUTH AND THROAT WITH OR WITHOUT PRESENCE OF ULCERS  *URINARY PROBLEMS  *BOWEL PROBLEMS  UNUSUAL RASH Items with * indicate a potential emergency and should be followed up as soon as possible.  One of the nurses will contact you 24 hours after your treatment. Please let the nurse know about any problems that you may have experienced. Feel free to call the clinic you have any questions or concerns. The clinic phone number is [phone].   I have been informed and understand all the instructions given to me. I know to contact the clinic, my physician, or go to the Emergency Department if any problems should occur. I do not have any questions at this time, but understand that I may call the clinic during office hours   should I have any questions or need assistance in obtaining follow up care.    __________________________________________  _____________  __________ Signature of Patient or Authorized Representative            Date                   Time    __________________________________________ Nurse's Signature    

## 2012-08-28 ENCOUNTER — Other Ambulatory Visit: Payer: Self-pay

## 2012-09-02 ENCOUNTER — Telehealth: Payer: Self-pay | Admitting: *Deleted

## 2012-09-02 NOTE — Telephone Encounter (Signed)
Patient called requesting to change her next Herceptin appointment on 09-06-2012 to the following week.  Reports reaction with treatments.  Her family has pplanned a get together for her 60th wedding anniversary.  After Herceptin she feels bad (extremely weak, tired and lightheaded) and it takes a few days to get over this.  Will notify [facility] staff.  Can reach pt. at [phone].

## 2012-09-06 ENCOUNTER — Other Ambulatory Visit (HOSPITAL_BASED_OUTPATIENT_CLINIC_OR_DEPARTMENT_OTHER): Payer: Medicare Other | Admitting: Lab

## 2012-09-06 ENCOUNTER — Ambulatory Visit (HOSPITAL_BASED_OUTPATIENT_CLINIC_OR_DEPARTMENT_OTHER): Payer: Medicare Other

## 2012-09-06 VITALS — BP 152/71 | HR 72 | Temp 97.1°F

## 2012-09-06 DIAGNOSIS — C50219 Malignant neoplasm of upper-inner quadrant of unspecified female breast: Secondary | ICD-10-CM

## 2012-09-06 DIAGNOSIS — Z5112 Encounter for antineoplastic immunotherapy: Secondary | ICD-10-CM

## 2012-09-06 DIAGNOSIS — C50919 Malignant neoplasm of unspecified site of unspecified female breast: Secondary | ICD-10-CM

## 2012-09-06 LAB — CBC WITH DIFFERENTIAL/PLATELET
Eosinophils Absolute: 0.4 10*3/uL (ref 0.0–0.5)
MONO#: 1.3 10*3/uL — ABNORMAL HIGH (ref 0.1–0.9)
MONO%: 10.9 % (ref 0.0–14.0)
NEUT#: 6.9 10*3/uL — ABNORMAL HIGH (ref 1.5–6.5)
RBC: 4.29 10*6/uL (ref 3.70–5.45)
RDW: 13.1 % (ref 11.2–14.5)
WBC: 12.3 10*3/uL — ABNORMAL HIGH (ref 3.9–10.3)
lymph#: 3.6 10*3/uL — ABNORMAL HIGH (ref 0.9–3.3)
nRBC: 0 % (ref 0–0)

## 2012-09-06 MED ORDER — TRASTUZUMAB CHEMO INJECTION 440 MG
6.0000 mg/kg | Freq: Once | INTRAVENOUS | Status: AC
  Administered 2012-09-06: 420 mg via INTRAVENOUS
  Filled 2012-09-06: qty 20

## 2012-09-06 MED ORDER — DIPHENHYDRAMINE HCL 25 MG PO CAPS
50.0000 mg | ORAL_CAPSULE | Freq: Once | ORAL | Status: AC
  Administered 2012-09-06: 50 mg via ORAL

## 2012-09-06 MED ORDER — ACETAMINOPHEN 325 MG PO TABS
650.0000 mg | ORAL_TABLET | Freq: Once | ORAL | Status: AC
  Administered 2012-09-06: 650 mg via ORAL

## 2012-09-06 MED ORDER — SODIUM CHLORIDE 0.9 % IV SOLN
Freq: Once | INTRAVENOUS | Status: AC
  Administered 2012-09-06: 12:00:00 via INTRAVENOUS

## 2012-09-06 NOTE — Patient Instructions (Addendum)
[facility] Discharge Instructions for Patients Receiving Chemotherapy  Today you received the following chemotherapy agents Herceptin To help prevent nausea and vomiting after your treatment, we encourage you to take your nausea medication as prescribed.  If you develop nausea and vomiting that is not controlled by your nausea medication, call the clinic. If it is after clinic hours your family physician or the after hours number for the clinic or go to the Emergency Department.   BELOW ARE SYMPTOMS THAT SHOULD BE REPORTED IMMEDIATELY:  *FEVER GREATER THAN 100.5 F  *CHILLS WITH OR WITHOUT FEVER  NAUSEA AND VOMITING THAT IS NOT CONTROLLED WITH YOUR NAUSEA MEDICATION  *UNUSUAL SHORTNESS OF BREATH  *UNUSUAL BRUISING OR BLEEDING  TENDERNESS IN MOUTH AND THROAT WITH OR WITHOUT PRESENCE OF ULCERS  *URINARY PROBLEMS  *BOWEL PROBLEMS  UNUSUAL RASH Items with * indicate a potential emergency and should be followed up as soon as possible.  One of the nurses will contact you 24 hours after your treatment. Please let the nurse know about any problems that you may have experienced. Feel free to call the clinic you have any questions or concerns. The clinic phone number is [phone].   I have been informed and understand all the instructions given to me. I know to contact the clinic, my physician, or go to the Emergency Department if any problems should occur. I do not have any questions at this time, but understand that I may call the clinic during office hours   should I have any questions or need assistance in obtaining follow up care.    __________________________________________  _____________  __________ Signature of Patient or Authorized Representative            Date                   Time    __________________________________________ Nurse's Signature    

## 2012-09-29 ENCOUNTER — Ambulatory Visit (HOSPITAL_BASED_OUTPATIENT_CLINIC_OR_DEPARTMENT_OTHER): Payer: Medicare Other

## 2012-09-29 ENCOUNTER — Other Ambulatory Visit (HOSPITAL_BASED_OUTPATIENT_CLINIC_OR_DEPARTMENT_OTHER): Payer: Medicare Other | Admitting: Lab

## 2012-09-29 ENCOUNTER — Other Ambulatory Visit: Payer: Self-pay | Admitting: Oncology

## 2012-09-29 VITALS — BP 144/88 | HR 74 | Temp 97.5°F

## 2012-09-29 DIAGNOSIS — C50919 Malignant neoplasm of unspecified site of unspecified female breast: Secondary | ICD-10-CM

## 2012-09-29 DIAGNOSIS — C50219 Malignant neoplasm of upper-inner quadrant of unspecified female breast: Secondary | ICD-10-CM

## 2012-09-29 DIAGNOSIS — Z5112 Encounter for antineoplastic immunotherapy: Secondary | ICD-10-CM

## 2012-09-29 LAB — CBC WITH DIFFERENTIAL/PLATELET
BASO%: 1.3 % (ref 0.0–2.0)
HCT: 40.2 % (ref 34.8–46.6)
HGB: 13.8 g/dL (ref 11.6–15.9)
MCHC: 34.3 g/dL (ref 31.5–36.0)
MONO#: 1.2 10*3/uL — ABNORMAL HIGH (ref 0.1–0.9)
NEUT#: 4.1 10*3/uL (ref 1.5–6.5)
NEUT%: 46.3 % (ref 38.4–76.8)
WBC: 8.8 10*3/uL (ref 3.9–10.3)
lymph#: 3 10*3/uL (ref 0.9–3.3)

## 2012-09-29 MED ORDER — SODIUM CHLORIDE 0.9 % IV SOLN
Freq: Once | INTRAVENOUS | Status: AC
  Administered 2012-09-29: 12:00:00 via INTRAVENOUS

## 2012-09-29 MED ORDER — ACETAMINOPHEN 325 MG PO TABS
650.0000 mg | ORAL_TABLET | Freq: Once | ORAL | Status: AC
  Administered 2012-09-29: 650 mg via ORAL

## 2012-09-29 MED ORDER — TRASTUZUMAB CHEMO INJECTION 440 MG
6.0000 mg/kg | Freq: Once | INTRAVENOUS | Status: AC
  Administered 2012-09-29: 441 mg via INTRAVENOUS
  Filled 2012-09-29: qty 21

## 2012-09-29 MED ORDER — DIPHENHYDRAMINE HCL 25 MG PO CAPS
50.0000 mg | ORAL_CAPSULE | Freq: Once | ORAL | Status: AC
  Administered 2012-09-29: 50 mg via ORAL

## 2012-09-29 NOTE — Progress Notes (Signed)
1155 Pt and daughter wanted Dr. Darnelle Catalan to be aware of discoloration of hands.  Pt reports no SOB, "my hands and feet has been a little discolored"  Pt wanted to make sure 2D Echo is ordered for April as well.  Vitals are stable and Dr. Darnelle Catalan made aware of all information.  Ordered to proceed with Herceptin today and MD will see pt in treatment room today.

## 2012-09-29 NOTE — Patient Instructions (Signed)
[facility] Discharge Instructions for Patients Receiving Chemotherapy  Today you received the following chemotherapy agents: herceptin  To help prevent nausea and vomiting after your treatment, we encourage you to take your nausea medication.  Take it as often as prescribed.     If you develop nausea and vomiting that is not controlled by your nausea medication, call the clinic. If it is after clinic hours your family physician or the after hours number for the clinic or go to the Emergency Department.   BELOW ARE SYMPTOMS THAT SHOULD BE REPORTED IMMEDIATELY:  *FEVER GREATER THAN 100.5 F  *CHILLS WITH OR WITHOUT FEVER  NAUSEA AND VOMITING THAT IS NOT CONTROLLED WITH YOUR NAUSEA MEDICATION  *UNUSUAL SHORTNESS OF BREATH  *UNUSUAL BRUISING OR BLEEDING  TENDERNESS IN MOUTH AND THROAT WITH OR WITHOUT PRESENCE OF ULCERS  *URINARY PROBLEMS  *BOWEL PROBLEMS  UNUSUAL RASH Items with * indicate a potential emergency and should be followed up as soon as possible.  Feel free to call the clinic you have any questions or concerns. The clinic phone number is [phone].   I have been informed and understand all the instructions given to me. I know to contact the clinic, my physician, or go to the Emergency Department if any problems should occur. I do not have any questions at this time, but understand that I may call the clinic during office hours   should I have any questions or need assistance in obtaining follow up care.    __________________________________________  _____________  __________ Signature of Patient or Authorized Representative            Date                   Time    __________________________________________ Nurse's Signature    

## 2012-10-20 ENCOUNTER — Telehealth: Payer: Self-pay | Admitting: *Deleted

## 2012-10-20 ENCOUNTER — Other Ambulatory Visit (HOSPITAL_BASED_OUTPATIENT_CLINIC_OR_DEPARTMENT_OTHER): Payer: Medicare Other | Admitting: Lab

## 2012-10-20 ENCOUNTER — Other Ambulatory Visit: Payer: Self-pay | Admitting: Oncology

## 2012-10-20 ENCOUNTER — Ambulatory Visit (HOSPITAL_BASED_OUTPATIENT_CLINIC_OR_DEPARTMENT_OTHER): Payer: Medicare Other | Admitting: Physician Assistant

## 2012-10-20 ENCOUNTER — Ambulatory Visit (HOSPITAL_BASED_OUTPATIENT_CLINIC_OR_DEPARTMENT_OTHER): Payer: Medicare Other

## 2012-10-20 ENCOUNTER — Encounter: Payer: Self-pay | Admitting: Physician Assistant

## 2012-10-20 VITALS — BP 143/86 | HR 74 | Temp 98.4°F | Resp 20 | Ht 65.0 in | Wt 163.1 lb

## 2012-10-20 DIAGNOSIS — C50219 Malignant neoplasm of upper-inner quadrant of unspecified female breast: Secondary | ICD-10-CM

## 2012-10-20 DIAGNOSIS — M8580 Other specified disorders of bone density and structure, unspecified site: Secondary | ICD-10-CM

## 2012-10-20 DIAGNOSIS — Z5112 Encounter for antineoplastic immunotherapy: Secondary | ICD-10-CM

## 2012-10-20 DIAGNOSIS — M899 Disorder of bone, unspecified: Secondary | ICD-10-CM

## 2012-10-20 DIAGNOSIS — Z1231 Encounter for screening mammogram for malignant neoplasm of breast: Secondary | ICD-10-CM

## 2012-10-20 DIAGNOSIS — R079 Chest pain, unspecified: Secondary | ICD-10-CM

## 2012-10-20 DIAGNOSIS — Z170 Estrogen receptor positive status [ER+]: Secondary | ICD-10-CM

## 2012-10-20 DIAGNOSIS — C50211 Malignant neoplasm of upper-inner quadrant of right female breast: Secondary | ICD-10-CM

## 2012-10-20 DIAGNOSIS — C50919 Malignant neoplasm of unspecified site of unspecified female breast: Secondary | ICD-10-CM

## 2012-10-20 DIAGNOSIS — R071 Chest pain on breathing: Secondary | ICD-10-CM

## 2012-10-20 DIAGNOSIS — Z780 Asymptomatic menopausal state: Secondary | ICD-10-CM

## 2012-10-20 LAB — CBC WITH DIFFERENTIAL/PLATELET
Eosinophils Absolute: 0.4 10*3/uL (ref 0.0–0.5)
LYMPH%: 25.6 % (ref 14.0–49.7)
MONO#: 1.2 10*3/uL — ABNORMAL HIGH (ref 0.1–0.9)
NEUT#: 5.9 10*3/uL (ref 1.5–6.5)
Platelets: 336 10*3/uL (ref 145–400)
RBC: 4.32 10*6/uL (ref 3.70–5.45)
RDW: 13.1 % (ref 11.2–14.5)
WBC: 10.3 10*3/uL (ref 3.9–10.3)

## 2012-10-20 MED ORDER — ACETAMINOPHEN 325 MG PO TABS
650.0000 mg | ORAL_TABLET | Freq: Once | ORAL | Status: AC
  Administered 2012-10-20: 650 mg via ORAL

## 2012-10-20 MED ORDER — SODIUM CHLORIDE 0.9 % IV SOLN
Freq: Once | INTRAVENOUS | Status: AC
  Administered 2012-10-20: 12:00:00 via INTRAVENOUS

## 2012-10-20 MED ORDER — DIPHENHYDRAMINE HCL 25 MG PO CAPS
50.0000 mg | ORAL_CAPSULE | Freq: Once | ORAL | Status: AC
  Administered 2012-10-20: 50 mg via ORAL

## 2012-10-20 MED ORDER — TRASTUZUMAB CHEMO INJECTION 440 MG
6.0000 mg/kg | Freq: Once | INTRAVENOUS | Status: AC
  Administered 2012-10-20: 441 mg via INTRAVENOUS
  Filled 2012-10-20: qty 21

## 2012-10-20 NOTE — Patient Instructions (Addendum)
[facility] Discharge Instructions for Patients Receiving Chemotherapy  Today you received the following chemotherapy agents: Herceptin. To help prevent nausea and vomiting after your treatment, we encourage you to take your nausea medication.    If you develop nausea and vomiting that is not controlled by your nausea medication, call the clinic.    BELOW ARE SYMPTOMS THAT SHOULD BE REPORTED IMMEDIATELY:  *FEVER GREATER THAN 100.5 F  *CHILLS WITH OR WITHOUT FEVER  NAUSEA AND VOMITING THAT IS NOT CONTROLLED WITH YOUR NAUSEA MEDICATION  *UNUSUAL SHORTNESS OF BREATH  *UNUSUAL BRUISING OR BLEEDING  TENDERNESS IN MOUTH AND THROAT WITH OR WITHOUT PRESENCE OF ULCERS  *URINARY PROBLEMS  *BOWEL PROBLEMS  UNUSUAL RASH Items with * indicate a potential emergency and should be followed up as soon as possible.  Please let the nurse know about any problems that you may have experienced. Feel free to call the clinic you have any questions or concerns. The clinic phone number is [phone].

## 2012-10-20 NOTE — Telephone Encounter (Signed)
Per staff message and POF I have scheduled appts.  JMW  

## 2012-10-20 NOTE — Telephone Encounter (Signed)
appts made for [place], [place] [facility], and to [facility] on 02/02/13. Emailed MW to add herceptin on 02/02/13.
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
## 2012-10-31 ENCOUNTER — Encounter (INDEPENDENT_AMBULATORY_CARE_PROVIDER_SITE_OTHER): Payer: Self-pay

## 2012-11-01 ENCOUNTER — Ambulatory Visit (HOSPITAL_COMMUNITY)
Admission: RE | Admit: 2012-11-01 | Discharge: 2012-11-01 | Disposition: A | Discharge status: Home / Self Care | Payer: Medicare Other | Source: Ambulatory Visit | Attending: Internal Medicine | Admitting: Internal Medicine

## 2012-11-01 ENCOUNTER — Ambulatory Visit (HOSPITAL_BASED_OUTPATIENT_CLINIC_OR_DEPARTMENT_OTHER)
Admission: RE | Admit: 2012-11-01 | Discharge: 2012-11-01 | Disposition: A | Discharge status: Home / Self Care | Payer: Medicare Other | Source: Ambulatory Visit | Attending: Internal Medicine | Admitting: Internal Medicine

## 2012-11-01 VITALS — BP 160/90 | HR 77 | Wt 162.0 lb

## 2012-11-01 DIAGNOSIS — Z09 Encounter for follow-up examination after completed treatment for conditions other than malignant neoplasm: Secondary | ICD-10-CM

## 2012-11-01 DIAGNOSIS — M899 Disorder of bone, unspecified: Secondary | ICD-10-CM | POA: Insufficient documentation

## 2012-11-01 DIAGNOSIS — R5381 Other malaise: Secondary | ICD-10-CM | POA: Insufficient documentation

## 2012-11-01 DIAGNOSIS — IMO0001 Reserved for inherently not codable concepts without codable children: Secondary | ICD-10-CM | POA: Insufficient documentation

## 2012-11-01 DIAGNOSIS — C50212 Malignant neoplasm of upper-inner quadrant of left female breast: Secondary | ICD-10-CM

## 2012-11-01 DIAGNOSIS — M1990 Unspecified osteoarthritis, unspecified site: Secondary | ICD-10-CM | POA: Insufficient documentation

## 2012-11-01 DIAGNOSIS — M949 Disorder of cartilage, unspecified: Secondary | ICD-10-CM | POA: Insufficient documentation

## 2012-11-01 DIAGNOSIS — C50219 Malignant neoplasm of upper-inner quadrant of unspecified female breast: Secondary | ICD-10-CM

## 2012-11-01 DIAGNOSIS — I10 Essential (primary) hypertension: Secondary | ICD-10-CM | POA: Insufficient documentation

## 2012-11-01 DIAGNOSIS — Z9010 Acquired absence of unspecified breast and nipple: Secondary | ICD-10-CM | POA: Insufficient documentation

## 2012-11-01 DIAGNOSIS — G4730 Sleep apnea, unspecified: Secondary | ICD-10-CM | POA: Insufficient documentation

## 2012-11-01 DIAGNOSIS — R5383 Other fatigue: Secondary | ICD-10-CM | POA: Insufficient documentation

## 2012-11-01 DIAGNOSIS — K219 Gastro-esophageal reflux disease without esophagitis: Secondary | ICD-10-CM | POA: Insufficient documentation

## 2012-11-01 DIAGNOSIS — C50919 Malignant neoplasm of unspecified site of unspecified female breast: Secondary | ICD-10-CM | POA: Insufficient documentation

## 2012-11-01 DIAGNOSIS — Z79899 Other long term (current) drug therapy: Secondary | ICD-10-CM | POA: Insufficient documentation

## 2012-11-01 DIAGNOSIS — I059 Rheumatic mitral valve disease, unspecified: Secondary | ICD-10-CM | POA: Insufficient documentation

## 2012-11-01 MED ORDER — LISINOPRIL-HYDROCHLOROTHIAZIDE 20-12.5 MG PO TABS: 2.0000 | ORAL_TABLET | Freq: Every day | ORAL | Status: DC

## 2012-11-01 NOTE — Progress Notes (Signed)
  Echocardiogram 2D Echocardiogram has been performed.  Georgian Co 11/01/2012, 2:47 PM

## 2012-11-01 NOTE — Patient Instructions (Addendum)
Take lisnopril-HCTZ  2 tablets daily.  Take Follow up in 3 months with an ECHO

## 2012-11-06 DIAGNOSIS — I10 Essential (primary) hypertension: Secondary | ICD-10-CM

## 2012-11-06 HISTORY — DX: Essential (primary) hypertension: I10

## 2012-11-06 NOTE — Assessment & Plan Note (Signed)
BP is up consistently. Will double lisinopril-HCTZ. Will have f/u BMET at [facility] with already scheduled labs.

## 2012-11-06 NOTE — Assessment & Plan Note (Signed)
I reviewed echos personally. EF and Doppler parameters stable. No HF on exam. Continue Herceptin.   
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
## 2012-11-10 ENCOUNTER — Ambulatory Visit (HOSPITAL_BASED_OUTPATIENT_CLINIC_OR_DEPARTMENT_OTHER): Payer: Medicare Other

## 2012-11-10 ENCOUNTER — Other Ambulatory Visit (HOSPITAL_BASED_OUTPATIENT_CLINIC_OR_DEPARTMENT_OTHER): Payer: Medicare Other | Admitting: Lab

## 2012-11-10 VITALS — BP 142/81 | HR 88 | Temp 97.8°F | Resp 18

## 2012-11-10 DIAGNOSIS — C50211 Malignant neoplasm of upper-inner quadrant of right female breast: Secondary | ICD-10-CM

## 2012-11-10 DIAGNOSIS — Z5112 Encounter for antineoplastic immunotherapy: Secondary | ICD-10-CM

## 2012-11-10 DIAGNOSIS — C50219 Malignant neoplasm of upper-inner quadrant of unspecified female breast: Secondary | ICD-10-CM

## 2012-11-10 DIAGNOSIS — C50919 Malignant neoplasm of unspecified site of unspecified female breast: Secondary | ICD-10-CM

## 2012-11-10 LAB — CBC WITH DIFFERENTIAL/PLATELET
BASO%: 1.7 % (ref 0.0–2.0)
EOS%: 5.9 % (ref 0.0–7.0)
LYMPH%: 28.5 % (ref 14.0–49.7)
MCH: 31.6 pg (ref 25.1–34.0)
MCHC: 35.4 g/dL (ref 31.5–36.0)
MONO#: 1.1 10*3/uL — ABNORMAL HIGH (ref 0.1–0.9)
RBC: 4.4 10*6/uL (ref 3.70–5.45)
WBC: 8.5 10*3/uL (ref 3.9–10.3)
lymph#: 2.4 10*3/uL (ref 0.9–3.3)
nRBC: 0 % (ref 0–0)

## 2012-11-10 MED ORDER — SODIUM CHLORIDE 0.9 % IV SOLN
Freq: Once | INTRAVENOUS | Status: AC
  Administered 2012-11-10: 12:00:00 via INTRAVENOUS

## 2012-11-10 MED ORDER — DIPHENHYDRAMINE HCL 25 MG PO CAPS
50.0000 mg | ORAL_CAPSULE | Freq: Once | ORAL | Status: AC
  Administered 2012-11-10: 50 mg via ORAL

## 2012-11-10 MED ORDER — TRASTUZUMAB CHEMO INJECTION 440 MG
6.0000 mg/kg | Freq: Once | INTRAVENOUS | Status: AC
  Administered 2012-11-10: 441 mg via INTRAVENOUS
  Filled 2012-11-10: qty 21

## 2012-11-10 MED ORDER — ACETAMINOPHEN 325 MG PO TABS
650.0000 mg | ORAL_TABLET | Freq: Once | ORAL | Status: AC
  Administered 2012-11-10: 650 mg via ORAL

## 2012-11-10 NOTE — Patient Instructions (Signed)

## 2012-11-23 ENCOUNTER — Other Ambulatory Visit: Payer: Self-pay | Admitting: *Deleted

## 2012-11-23 DIAGNOSIS — K219 Gastro-esophageal reflux disease without esophagitis: Secondary | ICD-10-CM

## 2012-11-23 DIAGNOSIS — C50211 Malignant neoplasm of upper-inner quadrant of right female breast: Secondary | ICD-10-CM

## 2012-11-23 MED ORDER — TAMOXIFEN CITRATE 20 MG PO TABS
20.0000 mg | ORAL_TABLET | Freq: Every day | ORAL | Status: DC
Start: 2012-11-23 — End: 2013-03-16

## 2012-12-01 ENCOUNTER — Other Ambulatory Visit (HOSPITAL_BASED_OUTPATIENT_CLINIC_OR_DEPARTMENT_OTHER): Payer: Medicare Other | Admitting: Lab

## 2012-12-01 ENCOUNTER — Ambulatory Visit (HOSPITAL_BASED_OUTPATIENT_CLINIC_OR_DEPARTMENT_OTHER): Payer: Medicare Other

## 2012-12-01 ENCOUNTER — Encounter: Payer: Self-pay | Admitting: Gynecology

## 2012-12-01 DIAGNOSIS — C50219 Malignant neoplasm of upper-inner quadrant of unspecified female breast: Secondary | ICD-10-CM

## 2012-12-01 DIAGNOSIS — C50919 Malignant neoplasm of unspecified site of unspecified female breast: Secondary | ICD-10-CM

## 2012-12-01 DIAGNOSIS — C50211 Malignant neoplasm of upper-inner quadrant of right female breast: Secondary | ICD-10-CM

## 2012-12-01 LAB — CBC WITH DIFFERENTIAL/PLATELET
BASO%: 1 % (ref 0.0–2.0)
EOS%: 3.3 % (ref 0.0–7.0)
LYMPH%: 26.9 % (ref 14.0–49.7)
MCHC: 35.2 g/dL (ref 31.5–36.0)
MONO#: 1.3 10*3/uL — ABNORMAL HIGH (ref 0.1–0.9)
Platelets: 308 10*3/uL (ref 145–400)
RBC: 4.14 10*6/uL (ref 3.70–5.45)
WBC: 11 10*3/uL — ABNORMAL HIGH (ref 3.9–10.3)
lymph#: 3 10*3/uL (ref 0.9–3.3)
nRBC: 0 % (ref 0–0)

## 2012-12-01 MED ORDER — ACETAMINOPHEN 325 MG PO TABS
650.0000 mg | ORAL_TABLET | Freq: Once | ORAL | Status: AC
  Administered 2012-12-01: 650 mg via ORAL

## 2012-12-01 MED ORDER — TRASTUZUMAB CHEMO INJECTION 440 MG
6.0000 mg/kg | Freq: Once | INTRAVENOUS | Status: AC
  Administered 2012-12-01: 441 mg via INTRAVENOUS
  Filled 2012-12-01: qty 21

## 2012-12-01 MED ORDER — DIPHENHYDRAMINE HCL 25 MG PO CAPS
50.0000 mg | ORAL_CAPSULE | Freq: Once | ORAL | Status: AC
  Administered 2012-12-01: 50 mg via ORAL

## 2012-12-01 MED ORDER — SODIUM CHLORIDE 0.9 % IV SOLN
Freq: Once | INTRAVENOUS | Status: AC
  Administered 2012-12-01: 12:00:00 via INTRAVENOUS

## 2012-12-01 NOTE — Patient Instructions (Addendum)
Patient aware of next appointment; discharged home with no complaints. 

## 2012-12-15 ENCOUNTER — Other Ambulatory Visit (HOSPITAL_COMMUNITY): Payer: Self-pay | Admitting: Internal Medicine

## 2012-12-22 ENCOUNTER — Ambulatory Visit (HOSPITAL_BASED_OUTPATIENT_CLINIC_OR_DEPARTMENT_OTHER): Payer: Medicare Other

## 2012-12-22 ENCOUNTER — Other Ambulatory Visit: Payer: Self-pay | Admitting: Gynecology

## 2012-12-22 ENCOUNTER — Other Ambulatory Visit (HOSPITAL_BASED_OUTPATIENT_CLINIC_OR_DEPARTMENT_OTHER): Payer: Medicare Other | Admitting: Lab

## 2012-12-22 VITALS — BP 145/70 | HR 90 | Temp 98.0°F

## 2012-12-22 DIAGNOSIS — C50219 Malignant neoplasm of upper-inner quadrant of unspecified female breast: Secondary | ICD-10-CM

## 2012-12-22 DIAGNOSIS — C50211 Malignant neoplasm of upper-inner quadrant of right female breast: Secondary | ICD-10-CM

## 2012-12-22 DIAGNOSIS — C50919 Malignant neoplasm of unspecified site of unspecified female breast: Secondary | ICD-10-CM

## 2012-12-22 DIAGNOSIS — Z5112 Encounter for antineoplastic immunotherapy: Secondary | ICD-10-CM

## 2012-12-22 DIAGNOSIS — M8580 Other specified disorders of bone density and structure, unspecified site: Secondary | ICD-10-CM

## 2012-12-22 LAB — CBC WITH DIFFERENTIAL/PLATELET
Basophils Absolute: 0.1 10*3/uL (ref 0.0–0.1)
Eosinophils Absolute: 0.3 10*3/uL (ref 0.0–0.5)
HCT: 37.8 % (ref 34.8–46.6)
HGB: 13.2 g/dL (ref 11.6–15.9)
NEUT#: 3.6 10*3/uL (ref 1.5–6.5)
NEUT%: 47.8 % (ref 38.4–76.8)
RDW: 13.4 % (ref 11.2–14.5)
lymph#: 2.5 10*3/uL (ref 0.9–3.3)

## 2012-12-22 MED ORDER — DIPHENHYDRAMINE HCL 25 MG PO CAPS
50.0000 mg | ORAL_CAPSULE | Freq: Once | ORAL | Status: AC
  Administered 2012-12-22: 50 mg via ORAL

## 2012-12-22 MED ORDER — SODIUM CHLORIDE 0.9 % IV SOLN
6.0000 mg/kg | Freq: Once | INTRAVENOUS | Status: AC
  Administered 2012-12-22: 441 mg via INTRAVENOUS
  Filled 2012-12-22: qty 21

## 2012-12-22 MED ORDER — SODIUM CHLORIDE 0.9 % IV SOLN
Freq: Once | INTRAVENOUS | Status: AC
  Administered 2012-12-22: 12:00:00 via INTRAVENOUS

## 2012-12-22 MED ORDER — ACETAMINOPHEN 325 MG PO TABS
650.0000 mg | ORAL_TABLET | Freq: Once | ORAL | Status: AC
  Administered 2012-12-22: 650 mg via ORAL

## 2012-12-22 NOTE — Patient Instructions (Signed)
[facility] Discharge Instructions for Patients Receiving Chemotherapy  Today you received the following chemotherapy agents Herceptin.  To help prevent nausea and vomiting after your treatment, we encourage you to take your nausea medication as prescribed.   If you develop nausea and vomiting that is not controlled by your nausea medication, call the clinic.   BELOW ARE SYMPTOMS THAT SHOULD BE REPORTED IMMEDIATELY:  *FEVER GREATER THAN 100.5 F  *CHILLS WITH OR WITHOUT FEVER  NAUSEA AND VOMITING THAT IS NOT CONTROLLED WITH YOUR NAUSEA MEDICATION  *UNUSUAL SHORTNESS OF BREATH  *UNUSUAL BRUISING OR BLEEDING  TENDERNESS IN MOUTH AND THROAT WITH OR WITHOUT PRESENCE OF ULCERS  *URINARY PROBLEMS  *BOWEL PROBLEMS  UNUSUAL RASH Items with * indicate a potential emergency and should be followed up as soon as possible.  Feel free to call the clinic you have any questions or concerns. The clinic phone number is [phone].    

## 2013-01-12 ENCOUNTER — Other Ambulatory Visit (HOSPITAL_BASED_OUTPATIENT_CLINIC_OR_DEPARTMENT_OTHER): Payer: Medicare Other | Admitting: Lab

## 2013-01-12 ENCOUNTER — Ambulatory Visit (HOSPITAL_BASED_OUTPATIENT_CLINIC_OR_DEPARTMENT_OTHER): Payer: Medicare Other

## 2013-01-12 VITALS — BP 159/80 | HR 77 | Temp 98.4°F

## 2013-01-12 DIAGNOSIS — C50219 Malignant neoplasm of upper-inner quadrant of unspecified female breast: Secondary | ICD-10-CM

## 2013-01-12 DIAGNOSIS — Z5112 Encounter for antineoplastic immunotherapy: Secondary | ICD-10-CM

## 2013-01-12 DIAGNOSIS — C50211 Malignant neoplasm of upper-inner quadrant of right female breast: Secondary | ICD-10-CM

## 2013-01-12 LAB — CBC WITH DIFFERENTIAL/PLATELET
Basophils Absolute: 0 10*3/uL (ref 0.0–0.1)
EOS%: 3.4 % (ref 0.0–7.0)
Eosinophils Absolute: 0.4 10*3/uL (ref 0.0–0.5)
HGB: 13.4 g/dL (ref 11.6–15.9)
MONO#: 1.3 10*3/uL — ABNORMAL HIGH (ref 0.1–0.9)
NEUT#: 6.5 10*3/uL (ref 1.5–6.5)
RDW: 13.5 % (ref 11.2–14.5)
lymph#: 2.7 10*3/uL (ref 0.9–3.3)

## 2013-01-12 MED ORDER — TRASTUZUMAB CHEMO INJECTION 440 MG
6.0000 mg/kg | Freq: Once | INTRAVENOUS | Status: AC
  Administered 2013-01-12: 441 mg via INTRAVENOUS
  Filled 2013-01-12: qty 21

## 2013-01-12 MED ORDER — DIPHENHYDRAMINE HCL 25 MG PO CAPS
50.0000 mg | ORAL_CAPSULE | Freq: Once | ORAL | Status: AC
  Administered 2013-01-12: 50 mg via ORAL

## 2013-01-12 MED ORDER — ACETAMINOPHEN 325 MG PO TABS
650.0000 mg | ORAL_TABLET | Freq: Once | ORAL | Status: AC
  Administered 2013-01-12: 650 mg via ORAL

## 2013-01-12 MED ORDER — SODIUM CHLORIDE 0.9 % IV SOLN
Freq: Once | INTRAVENOUS | Status: AC
  Administered 2013-01-12: 12:00:00 via INTRAVENOUS

## 2013-01-12 NOTE — Patient Instructions (Signed)
[facility] Discharge Instructions for Patients Receiving Chemotherapy  Today you received the following chemotherapy agent : Herceptin   If you develop nausea and vomiting please call the clinic. Your Nexium daily may help nausea.  BELOW ARE SYMPTOMS THAT SHOULD BE REPORTED IMMEDIATELY:  *FEVER GREATER THAN 100.5 F  *CHILLS WITH OR WITHOUT FEVER  NAUSEA AND VOMITING THAT IS NOT CONTROLLED WITH YOUR NAUSEA MEDICATION  *UNUSUAL SHORTNESS OF BREATH  *UNUSUAL BRUISING OR BLEEDING  TENDERNESS IN MOUTH AND THROAT WITH OR WITHOUT PRESENCE OF ULCERS  *URINARY PROBLEMS  *BOWEL PROBLEMS  UNUSUAL RASH Items with * indicate a potential emergency and should be followed up as soon as possible.  Feel free to call the clinic you have any questions or concerns. The clinic phone number is [phone].

## 2013-01-16 ENCOUNTER — Other Ambulatory Visit: Payer: Self-pay | Admitting: Oncology

## 2013-01-24 ENCOUNTER — Ambulatory Visit (INDEPENDENT_AMBULATORY_CARE_PROVIDER_SITE_OTHER): Payer: Medicare Other

## 2013-01-24 DIAGNOSIS — M899 Disorder of bone, unspecified: Secondary | ICD-10-CM

## 2013-01-24 DIAGNOSIS — M8580 Other specified disorders of bone density and structure, unspecified site: Secondary | ICD-10-CM

## 2013-02-01 ENCOUNTER — Telehealth: Payer: Self-pay | Admitting: Gynecology

## 2013-02-01 ENCOUNTER — Telehealth: Payer: Self-pay | Admitting: *Deleted

## 2013-02-01 ENCOUNTER — Encounter: Payer: Self-pay | Admitting: Gynecology

## 2013-02-01 NOTE — Telephone Encounter (Signed)
error 

## 2013-02-01 NOTE — Telephone Encounter (Signed)
Recommend patient make an appointment to discuss her bone density report.

## 2013-02-01 NOTE — Telephone Encounter (Signed)
Pt informed with the below note. 

## 2013-02-01 NOTE — Telephone Encounter (Signed)
Okay to discuss at her annual exam.

## 2013-02-01 NOTE — Telephone Encounter (Signed)
Pt informed with the below note she has annual appointment with you on 02/15/13 for annual. She would like to know if okay to talk with you then?  Please advise

## 2013-02-02 ENCOUNTER — Other Ambulatory Visit (HOSPITAL_BASED_OUTPATIENT_CLINIC_OR_DEPARTMENT_OTHER): Payer: Medicare Other | Admitting: Lab

## 2013-02-02 ENCOUNTER — Ambulatory Visit (HOSPITAL_BASED_OUTPATIENT_CLINIC_OR_DEPARTMENT_OTHER): Payer: Medicare Other | Admitting: Oncology

## 2013-02-02 ENCOUNTER — Other Ambulatory Visit (HOSPITAL_COMMUNITY): Payer: Self-pay | Admitting: Oncology

## 2013-02-02 ENCOUNTER — Ambulatory Visit (HOSPITAL_BASED_OUTPATIENT_CLINIC_OR_DEPARTMENT_OTHER): Payer: Medicare Other

## 2013-02-02 VITALS — BP 164/83 | HR 74 | Temp 97.8°F | Resp 20 | Wt 157.5 lb

## 2013-02-02 DIAGNOSIS — C50219 Malignant neoplasm of upper-inner quadrant of unspecified female breast: Secondary | ICD-10-CM

## 2013-02-02 DIAGNOSIS — C50211 Malignant neoplasm of upper-inner quadrant of right female breast: Secondary | ICD-10-CM

## 2013-02-02 DIAGNOSIS — C50919 Malignant neoplasm of unspecified site of unspecified female breast: Secondary | ICD-10-CM

## 2013-02-02 DIAGNOSIS — M899 Disorder of bone, unspecified: Secondary | ICD-10-CM

## 2013-02-02 DIAGNOSIS — M949 Disorder of cartilage, unspecified: Secondary | ICD-10-CM

## 2013-02-02 DIAGNOSIS — Z170 Estrogen receptor positive status [ER+]: Secondary | ICD-10-CM

## 2013-02-02 DIAGNOSIS — Z5112 Encounter for antineoplastic immunotherapy: Secondary | ICD-10-CM

## 2013-02-02 LAB — CBC WITH DIFFERENTIAL/PLATELET
Basophils Absolute: 0.1 10*3/uL (ref 0.0–0.1)
Eosinophils Absolute: 0.4 10*3/uL (ref 0.0–0.5)
HGB: 13.3 g/dL (ref 11.6–15.9)
MCV: 92 fL (ref 79.5–101.0)
MONO%: 11.9 % (ref 0.0–14.0)
NEUT#: 4.6 10*3/uL (ref 1.5–6.5)
Platelets: 297 10*3/uL (ref 145–400)
RDW: 13.4 % (ref 11.2–14.5)

## 2013-02-02 MED ORDER — HEPARIN SOD (PORK) LOCK FLUSH 100 UNIT/ML IV SOLN
500.0000 [IU] | Freq: Once | INTRAVENOUS | Status: DC | PRN
  Filled 2013-02-02: qty 5

## 2013-02-02 MED ORDER — SODIUM CHLORIDE 0.9 % IJ SOLN
10.0000 mL | INTRAMUSCULAR | Status: DC | PRN
  Filled 2013-02-02: qty 10

## 2013-02-02 MED ORDER — ACETAMINOPHEN 325 MG PO TABS
650.0000 mg | ORAL_TABLET | Freq: Once | ORAL | Status: AC
  Administered 2013-02-02: 650 mg via ORAL

## 2013-02-02 MED ORDER — DIPHENHYDRAMINE HCL 25 MG PO CAPS
50.0000 mg | ORAL_CAPSULE | Freq: Once | ORAL | Status: AC
  Administered 2013-02-02: 25 mg via ORAL

## 2013-02-02 MED ORDER — TRASTUZUMAB CHEMO INJECTION 440 MG
6.0000 mg/kg | Freq: Once | INTRAVENOUS | Status: AC
  Administered 2013-02-02: 441 mg via INTRAVENOUS
  Filled 2013-02-02: qty 21
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
## 2013-02-02 NOTE — Addendum Note (Signed)
Addended by: Billey Co on: 02/02/2013 05:22 PM   Modules accepted: Orders, Medications

## 2013-02-02 NOTE — Patient Instructions (Addendum)
[facility] Discharge Instructions for Patients Receiving Chemotherapy  Today you received the following chemotherapy agents Herceptin  To help prevent nausea and vomiting after your treatment, we encourage you to take your nausea medication     If you develop nausea and vomiting that is not controlled by your nausea medication, call the clinic.   BELOW ARE SYMPTOMS THAT SHOULD BE REPORTED IMMEDIATELY:  *FEVER GREATER THAN 100.5 F  *CHILLS WITH OR WITHOUT FEVER  NAUSEA AND VOMITING THAT IS NOT CONTROLLED WITH YOUR NAUSEA MEDICATION  *UNUSUAL SHORTNESS OF BREATH  *UNUSUAL BRUISING OR BLEEDING  TENDERNESS IN MOUTH AND THROAT WITH OR WITHOUT PRESENCE OF ULCERS  *URINARY PROBLEMS  *BOWEL PROBLEMS  UNUSUAL RASH Items with * indicate a potential emergency and should be followed up as soon as possible.  Feel free to call the clinic you have any questions or concerns. The clinic phone number is [phone].    

## 2013-02-08 ENCOUNTER — Other Ambulatory Visit (HOSPITAL_COMMUNITY): Payer: Self-pay | Admitting: Oncology

## 2013-02-09 ENCOUNTER — Ambulatory Visit (HOSPITAL_BASED_OUTPATIENT_CLINIC_OR_DEPARTMENT_OTHER)
Admission: RE | Admit: 2013-02-09 | Discharge: 2013-02-09 | Disposition: A | Discharge status: Home / Self Care | Payer: Medicare Other | Source: Ambulatory Visit | Attending: Internal Medicine | Admitting: Internal Medicine

## 2013-02-09 ENCOUNTER — Ambulatory Visit (HOSPITAL_COMMUNITY)
Admission: RE | Admit: 2013-02-09 | Discharge: 2013-02-09 | Disposition: A | Discharge status: Home / Self Care | Payer: Medicare Other | Source: Ambulatory Visit | Attending: Internal Medicine | Admitting: Internal Medicine

## 2013-02-09 ENCOUNTER — Encounter (HOSPITAL_COMMUNITY): Payer: Self-pay

## 2013-02-09 VITALS — BP 136/78 | HR 79 | Wt 160.1 lb

## 2013-02-09 DIAGNOSIS — I059 Rheumatic mitral valve disease, unspecified: Secondary | ICD-10-CM | POA: Insufficient documentation

## 2013-02-09 DIAGNOSIS — M899 Disorder of bone, unspecified: Secondary | ICD-10-CM | POA: Insufficient documentation

## 2013-02-09 DIAGNOSIS — K219 Gastro-esophageal reflux disease without esophagitis: Secondary | ICD-10-CM | POA: Insufficient documentation

## 2013-02-09 DIAGNOSIS — I10 Essential (primary) hypertension: Secondary | ICD-10-CM | POA: Insufficient documentation

## 2013-02-09 DIAGNOSIS — C50212 Malignant neoplasm of upper-inner quadrant of left female breast: Secondary | ICD-10-CM

## 2013-02-09 DIAGNOSIS — C50919 Malignant neoplasm of unspecified site of unspecified female breast: Secondary | ICD-10-CM | POA: Insufficient documentation

## 2013-02-09 DIAGNOSIS — Z09 Encounter for follow-up examination after completed treatment for conditions other than malignant neoplasm: Secondary | ICD-10-CM

## 2013-02-09 DIAGNOSIS — D249 Benign neoplasm of unspecified breast: Secondary | ICD-10-CM

## 2013-02-09 DIAGNOSIS — C50219 Malignant neoplasm of upper-inner quadrant of unspecified female breast: Secondary | ICD-10-CM

## 2013-02-09 DIAGNOSIS — M949 Disorder of cartilage, unspecified: Secondary | ICD-10-CM | POA: Insufficient documentation
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
## 2013-02-09 NOTE — Progress Notes (Signed)
*  PRELIMINARY RESULTS* Echocardiogram 2D Echocardiogram has been performed.  Jeryl Columbia 02/09/2013, 12:32 PM

## 2013-02-09 NOTE — Patient Instructions (Addendum)
The current medical regimen is effective;  continue present plan and medications.  Your physician has requested that you have an echocardiogram. Echocardiography is a painless test that uses sound waves to create images of your heart. It provides your doctor with information about the size and shape of your heart and how well your heart's chambers and valves are working. This procedure takes approximately one hour. There are no restrictions for this procedure.  Follow up in 2 months with Dr Gala Romney.

## 2013-02-09 NOTE — Addendum Note (Signed)
Encounter addended by: Rocco Serene, RN on: 02/09/2013 12:34 PM<BR>     Documentation filed: Orders, Patient Instructions Section, Follow-up Section

## 2013-02-15 ENCOUNTER — Encounter: Payer: Self-pay | Admitting: Gynecology

## 2013-02-15 ENCOUNTER — Ambulatory Visit (INDEPENDENT_AMBULATORY_CARE_PROVIDER_SITE_OTHER): Payer: Medicare Other | Admitting: Gynecology

## 2013-02-15 VITALS — BP 124/74 | Ht 66.0 in | Wt 160.0 lb

## 2013-02-15 DIAGNOSIS — M8580 Other specified disorders of bone density and structure, unspecified site: Secondary | ICD-10-CM

## 2013-02-15 DIAGNOSIS — N952 Postmenopausal atrophic vaginitis: Secondary | ICD-10-CM

## 2013-02-15 DIAGNOSIS — M949 Disorder of cartilage, unspecified: Secondary | ICD-10-CM

## 2013-02-15 DIAGNOSIS — K649 Unspecified hemorrhoids: Secondary | ICD-10-CM

## 2013-02-15 DIAGNOSIS — N8111 Cystocele, midline: Secondary | ICD-10-CM

## 2013-02-15 DIAGNOSIS — M899 Disorder of bone, unspecified: Secondary | ICD-10-CM

## 2013-02-15 NOTE — Patient Instructions (Signed)
Follow up in one year, sooner as needed. 

## 2013-02-15 NOTE — Progress Notes (Signed)
Amy Kline [birth year] [id number]        79 y.o.  [id number] for followup exam.  Former patient of Dr. Eda Paschal with several issues noted below.  Past medical history,surgical history, medications, allergies, family history and social history were all reviewed and documented in the EPIC chart.  ROS:  Performed and pertinent positives and negatives are included in the history, assessment and plan .  Exam: [name] Vitals:   02/15/13 1506  BP: 124/74  Height: 5\' 6"  (1.676 m)  Weight: 160 lb (72.576 kg)   General appearance  Normal Skin grossly normal Head/Neck normal with no cervical or supraclavicular adenopathy thyroid normal Lungs  clear Cardiac RR, without RMG Abdominal  soft, nontender, without masses, organomegaly or hernia Breast/chest wall  examined lying and sitting. Right without masses, retractions, discharge or axillary adenopathy. Left chest wall status post mastectomy with well-healed scar. No masses or adenopathy Pelvic  Ext/BUS/vagina  atrophic changes with second-degree cystocele first-degree uterine prolapse mild rectocel Cervix  normal atrophic  Uterus  anteverted, normal size, shape and contour, midline and mobile nontender   Adnexa  Without masses or tenderness    Anus and perineum  normal   Rectovaginal  normal sphincter tone without palpated masses or tenderness. External hemorrhoids noted   Assessment/Plan:  79 y.o. [id number] female for followup exam.   1. Pelvic relaxation with cystocele/rectocele/uterine prolapse. Has been present over the past several years but seems to be getting worse. Notes no significant incontinence but more retention with some difficulty completely emptying her back bladder. Options for management include observation, pessary versus surgery discussed. Patient not interested in doing anything at this point observation. Will call if she decides on pessary. 2. Postmenopausal/atrophic vaginal changes. Patient's overall doing  well without significant hot flashes, night sweats, vaginal dryness or dyspareunia. We'll continue to monitor. Knows to report any bleeding. 3. Osteopenia. DEXA 01/2013 with T score -2.1 FRAX 24%/6.7%. Overall improved from prior DEXA. Increased risk for fracture regardless discussed. Options for treatment reviewed. Had transient trial of alendronate previously but had dizziness which led to stopping this. Most recently started on tamoxifen. I reviewed the beneficial effect of this in reference to her bones. Options initiate treatment in addition to tamoxifen with Reclast or Prolia discussed. I think given the total picture particularly starting on tamoxifen we can hold at this point she is comfortable on that. Again she clearly understands and accepts the increased risks of fracture and we'll plan on monitoring with repeat DEXA in 2 years. She has no history of falls or fractures in the past. 4. Breast cancer. Patient's actively being followed by her oncologist and being treated with Herceptin. Last mammogram 11/2012. SBE reviewed. 5. Pap smear 2012. No Pap smear done today. No history of significant abnormal Pap smears previously. Review current screening guidelines and we agreed to stop screening and she is comfortable with this. 6. Colonoscopy 2003. She is due now and knows to call Dr. Juanda Chance to discuss and schedule. Does have external hemorrhoids since birth of her last child. Not overly bothersome to her. 7. Health maintenance. No blood work done as it is all done through her primary physician's office. Followup one year, sooner as needed.  Note: This document was prepared with digital dictation and possible smart phrase technology. Any transcriptional errors that result from this process are unintentional.   [name] MD, 4:16 PM 02/15/2013

## 2013-02-16 LAB — URINALYSIS W MICROSCOPIC + REFLEX CULTURE
Hgb urine dipstick: NEGATIVE
Nitrite: NEGATIVE
Protein, ur: NEGATIVE mg/dL
pH: 5 (ref 5.0–8.0)

## 2013-02-17 ENCOUNTER — Encounter: Payer: Self-pay | Admitting: Obstetrics and Gynecology

## 2013-02-17 ENCOUNTER — Other Ambulatory Visit: Payer: Self-pay | Admitting: *Deleted

## 2013-02-17 DIAGNOSIS — C50919 Malignant neoplasm of unspecified site of unspecified female breast: Secondary | ICD-10-CM

## 2013-02-17 LAB — URINE CULTURE
Colony Count: NO GROWTH
Organism ID, Bacteria: NO GROWTH

## 2013-02-17 NOTE — Progress Notes (Signed)
Patient calling in to get her next/final Herceptin scheduled on 8/14 and 03/16/13. Care plan orders are in place. Also notice that last dictation noted an October 2014 return visit and this has not been scheduled either. Will notify scheduling to please get this done and call patient with appts.

## 2013-02-20 ENCOUNTER — Telehealth: Payer: Self-pay | Admitting: *Deleted

## 2013-02-20 NOTE — Telephone Encounter (Signed)
Per staff phone call and POF I have schedueld appts.  JMW  

## 2013-02-20 NOTE — Telephone Encounter (Signed)
i sent an emailed to MW to add tx for 8/14 and 9/4. i also sent an email to [facility] for her to advise me on a time slot for 03/16/13 due to !:15pm was already taken...td

## 2013-02-21 ENCOUNTER — Telehealth: Payer: Self-pay | Admitting: *Deleted

## 2013-02-21 NOTE — Telephone Encounter (Signed)
sw pt gv appts for 8/14 labs @ 11am and tx @ 12:30pm, i also gv appts for 9/4 w/ labs@11 :15am, ov@ 11:45am, and tx to follow. Pt is aware. i also emailed MW to adjust the tx time...td

## 2013-02-21 NOTE — Telephone Encounter (Signed)
Per staff message I have adjusted appt for 9/4.  JMW

## 2013-02-23 ENCOUNTER — Telehealth: Payer: Self-pay | Admitting: *Deleted

## 2013-02-23 ENCOUNTER — Ambulatory Visit (HOSPITAL_BASED_OUTPATIENT_CLINIC_OR_DEPARTMENT_OTHER): Payer: Medicare Other

## 2013-02-23 ENCOUNTER — Other Ambulatory Visit: Payer: Medicare Other | Admitting: Lab

## 2013-02-23 ENCOUNTER — Other Ambulatory Visit (HOSPITAL_BASED_OUTPATIENT_CLINIC_OR_DEPARTMENT_OTHER): Payer: Medicare Other | Admitting: Lab

## 2013-02-23 DIAGNOSIS — Z5112 Encounter for antineoplastic immunotherapy: Secondary | ICD-10-CM

## 2013-02-23 DIAGNOSIS — C50219 Malignant neoplasm of upper-inner quadrant of unspecified female breast: Secondary | ICD-10-CM

## 2013-02-23 LAB — CBC WITH DIFFERENTIAL/PLATELET
Basophils Absolute: 0.1 10*3/uL (ref 0.0–0.1)
EOS%: 4.5 % (ref 0.0–7.0)
Eosinophils Absolute: 0.4 10*3/uL (ref 0.0–0.5)
HGB: 13.1 g/dL (ref 11.6–15.9)
NEUT#: 4.3 10*3/uL (ref 1.5–6.5)
RDW: 12.9 % (ref 11.2–14.5)
WBC: 8.4 10*3/uL (ref 3.9–10.3)
lymph#: 2.6 10*3/uL (ref 0.9–3.3)

## 2013-02-23 MED ORDER — DIPHENHYDRAMINE HCL 25 MG PO CAPS
50.0000 mg | ORAL_CAPSULE | Freq: Once | ORAL | Status: AC
  Administered 2013-02-23: 50 mg via ORAL

## 2013-02-23 MED ORDER — ACETAMINOPHEN 325 MG PO TABS
650.0000 mg | ORAL_TABLET | Freq: Once | ORAL | Status: AC
  Administered 2013-02-23: 650 mg via ORAL

## 2013-02-23 MED ORDER — SODIUM CHLORIDE 0.9 % IV SOLN
Freq: Once | INTRAVENOUS | Status: AC
  Administered 2013-02-23: 13:00:00 via INTRAVENOUS

## 2013-02-23 MED ORDER — TRASTUZUMAB CHEMO INJECTION 440 MG
6.0000 mg/kg | Freq: Once | INTRAVENOUS | Status: AC
  Administered 2013-02-23: 441 mg via INTRAVENOUS
  Filled 2013-02-23: qty 21

## 2013-02-23 NOTE — Patient Instructions (Signed)

## 2013-02-23 NOTE — Telephone Encounter (Signed)
Attempted to contact pt to make her aware lab appt was changed to 1200 to better coincide with her tx appt. No answer. There is no availability to move tx appt to earlier time.

## 2013-03-16 ENCOUNTER — Encounter: Payer: Self-pay | Admitting: Physician Assistant

## 2013-03-16 ENCOUNTER — Ambulatory Visit (HOSPITAL_BASED_OUTPATIENT_CLINIC_OR_DEPARTMENT_OTHER): Payer: Medicare Other

## 2013-03-16 ENCOUNTER — Ambulatory Visit: Payer: Medicare Other | Admitting: Lab

## 2013-03-16 ENCOUNTER — Other Ambulatory Visit: Payer: Medicare Other | Admitting: Lab

## 2013-03-16 ENCOUNTER — Other Ambulatory Visit (HOSPITAL_BASED_OUTPATIENT_CLINIC_OR_DEPARTMENT_OTHER): Payer: Medicare Other | Admitting: Lab

## 2013-03-16 ENCOUNTER — Ambulatory Visit (HOSPITAL_BASED_OUTPATIENT_CLINIC_OR_DEPARTMENT_OTHER): Payer: Medicare Other | Admitting: Physician Assistant

## 2013-03-16 ENCOUNTER — Telehealth: Payer: Self-pay | Admitting: *Deleted

## 2013-03-16 VITALS — BP 130/78 | HR 76 | Temp 98.3°F | Resp 18 | Ht 66.0 in | Wt 161.8 lb

## 2013-03-16 DIAGNOSIS — M899 Disorder of bone, unspecified: Secondary | ICD-10-CM

## 2013-03-16 DIAGNOSIS — C50219 Malignant neoplasm of upper-inner quadrant of unspecified female breast: Secondary | ICD-10-CM

## 2013-03-16 DIAGNOSIS — C50212 Malignant neoplasm of upper-inner quadrant of left female breast: Secondary | ICD-10-CM

## 2013-03-16 DIAGNOSIS — C50211 Malignant neoplasm of upper-inner quadrant of right female breast: Secondary | ICD-10-CM

## 2013-03-16 DIAGNOSIS — K219 Gastro-esophageal reflux disease without esophagitis: Secondary | ICD-10-CM

## 2013-03-16 DIAGNOSIS — Z5112 Encounter for antineoplastic immunotherapy: Secondary | ICD-10-CM

## 2013-03-16 LAB — CBC WITH DIFFERENTIAL/PLATELET
EOS%: 3.2 % (ref 0.0–7.0)
Eosinophils Absolute: 0.3 10*3/uL (ref 0.0–0.5)
LYMPH%: 24.8 % (ref 14.0–49.7)
MCH: 31.5 pg (ref 25.1–34.0)
MCV: 92 fL (ref 79.5–101.0)
MONO%: 9.6 % (ref 0.0–14.0)
NEUT#: 6.4 10*3/uL (ref 1.5–6.5)
Platelets: 321 10*3/uL (ref 145–400)
RBC: 4.25 10*6/uL (ref 3.70–5.45)
RDW: 13 % (ref 11.2–14.5)
nRBC: 0 % (ref 0–0)

## 2013-03-16 MED ORDER — DIPHENHYDRAMINE HCL 25 MG PO CAPS
50.0000 mg | ORAL_CAPSULE | Freq: Once | ORAL | Status: AC
  Administered 2013-03-16: 50 mg via ORAL

## 2013-03-16 MED ORDER — TRASTUZUMAB CHEMO INJECTION 440 MG
6.0000 mg/kg | Freq: Once | INTRAVENOUS | Status: AC
  Administered 2013-03-16: 441 mg via INTRAVENOUS
  Filled 2013-03-16: qty 21

## 2013-03-16 MED ORDER — TAMOXIFEN CITRATE 20 MG PO TABS: 20.0000 mg | ORAL_TABLET | Freq: Every day | ORAL | Status: DC

## 2013-03-16 MED ORDER — ACETAMINOPHEN 325 MG PO TABS
650.0000 mg | ORAL_TABLET | Freq: Once | ORAL | Status: AC
  Administered 2013-03-16: 650 mg via ORAL

## 2013-03-16 NOTE — Telephone Encounter (Signed)
appts made and printed...td 
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
## 2013-03-16 NOTE — Patient Instructions (Signed)
[facility] Discharge Instructions for Patients Receiving Chemotherapy  Today you received the following chemotherapy agents Herceptin.    If you develop nausea and vomiting that is not controlled by your nausea medication, call the clinic.   BELOW ARE SYMPTOMS THAT SHOULD BE REPORTED IMMEDIATELY:  *FEVER GREATER THAN 100.5 F  *CHILLS WITH OR WITHOUT FEVER  NAUSEA AND VOMITING THAT IS NOT CONTROLLED WITH YOUR NAUSEA MEDICATION  *UNUSUAL SHORTNESS OF BREATH  *UNUSUAL BRUISING OR BLEEDING  TENDERNESS IN MOUTH AND THROAT WITH OR WITHOUT PRESENCE OF ULCERS  *URINARY PROBLEMS  *BOWEL PROBLEMS  UNUSUAL RASH Items with * indicate a potential emergency and should be followed up as soon as possible.  Feel free to call the clinic you have any questions or concerns. The clinic phone number is [phone].    

## 2013-04-06 ENCOUNTER — Telehealth (HOSPITAL_COMMUNITY): Payer: Self-pay | Admitting: Cardiology

## 2013-04-06 DIAGNOSIS — C50919 Malignant neoplasm of unspecified site of unspecified female breast: Secondary | ICD-10-CM

## 2013-04-06 NOTE — Telephone Encounter (Signed)
ORDER PLACED FOR UPCOMING ECHO 

## 2013-04-11 ENCOUNTER — Ambulatory Visit (HOSPITAL_BASED_OUTPATIENT_CLINIC_OR_DEPARTMENT_OTHER)
Admission: RE | Admit: 2013-04-11 | Discharge: 2013-04-11 | Disposition: A | Discharge status: Home / Self Care | Payer: Medicare Other | Source: Ambulatory Visit | Attending: Cardiology | Admitting: Cardiology

## 2013-04-11 ENCOUNTER — Ambulatory Visit (HOSPITAL_COMMUNITY)
Admission: RE | Admit: 2013-04-11 | Discharge: 2013-04-11 | Disposition: A | Discharge status: Home / Self Care | Payer: Medicare Other | Source: Ambulatory Visit | Attending: Internal Medicine | Admitting: Internal Medicine

## 2013-04-11 ENCOUNTER — Encounter (HOSPITAL_COMMUNITY): Payer: Self-pay

## 2013-04-11 VITALS — BP 104/78 | HR 78 | Wt 161.8 lb

## 2013-04-11 DIAGNOSIS — C50919 Malignant neoplasm of unspecified site of unspecified female breast: Secondary | ICD-10-CM | POA: Insufficient documentation

## 2013-04-11 DIAGNOSIS — I10 Essential (primary) hypertension: Secondary | ICD-10-CM

## 2013-04-11 DIAGNOSIS — Z79899 Other long term (current) drug therapy: Secondary | ICD-10-CM | POA: Insufficient documentation

## 2013-04-11 DIAGNOSIS — I079 Rheumatic tricuspid valve disease, unspecified: Secondary | ICD-10-CM | POA: Insufficient documentation

## 2013-04-11 DIAGNOSIS — I369 Nonrheumatic tricuspid valve disorder, unspecified: Secondary | ICD-10-CM

## 2013-04-11 DIAGNOSIS — C50219 Malignant neoplasm of upper-inner quadrant of unspecified female breast: Secondary | ICD-10-CM

## 2013-04-11 NOTE — Progress Notes (Signed)
  Echocardiogram 2D Echocardiogram has been performed.  Nataly Pacifico FRANCES 04/11/2013, 12:11 PM

## 2013-04-11 NOTE — Patient Instructions (Addendum)
Follow up as needed
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
## 2013-04-13 ENCOUNTER — Other Ambulatory Visit: Payer: Self-pay | Admitting: *Deleted

## 2013-04-14 ENCOUNTER — Telehealth: Payer: Self-pay | Admitting: *Deleted

## 2013-04-14 NOTE — Telephone Encounter (Signed)
sw pt informed her that her lab for 05/23/13 is moved to 11/18 @ 11am..pt is aware...td

## 2013-05-23 ENCOUNTER — Other Ambulatory Visit: Payer: Medicare Other | Admitting: Lab

## 2013-05-30 ENCOUNTER — Telehealth: Payer: Self-pay | Admitting: *Deleted

## 2013-05-30 ENCOUNTER — Ambulatory Visit (HOSPITAL_BASED_OUTPATIENT_CLINIC_OR_DEPARTMENT_OTHER): Payer: Medicare Other | Admitting: Oncology

## 2013-05-30 ENCOUNTER — Other Ambulatory Visit (HOSPITAL_BASED_OUTPATIENT_CLINIC_OR_DEPARTMENT_OTHER): Payer: Medicare Other | Admitting: Lab

## 2013-05-30 VITALS — BP 136/83 | HR 90 | Temp 97.4°F | Resp 18 | Ht 66.0 in | Wt 162.2 lb

## 2013-05-30 DIAGNOSIS — C50212 Malignant neoplasm of upper-inner quadrant of left female breast: Secondary | ICD-10-CM

## 2013-05-30 DIAGNOSIS — Z170 Estrogen receptor positive status [ER+]: Secondary | ICD-10-CM

## 2013-05-30 DIAGNOSIS — M899 Disorder of bone, unspecified: Secondary | ICD-10-CM

## 2013-05-30 DIAGNOSIS — C50219 Malignant neoplasm of upper-inner quadrant of unspecified female breast: Secondary | ICD-10-CM

## 2013-05-30 LAB — CBC WITH DIFFERENTIAL/PLATELET
BASO%: 2.2 % — ABNORMAL HIGH (ref 0.0–2.0)
Basophils Absolute: 0.2 10*3/uL — ABNORMAL HIGH (ref 0.0–0.1)
EOS%: 3.2 % (ref 0.0–7.0)
MCH: 32.1 pg (ref 25.1–34.0)
MCHC: 33.7 g/dL (ref 31.5–36.0)
MCV: 95.4 fL (ref 79.5–101.0)
MONO#: 1 10*3/uL — ABNORMAL HIGH (ref 0.1–0.9)
MONO%: 11.2 % (ref 0.0–14.0)
RBC: 4.07 10*6/uL (ref 3.70–5.45)
RDW: 13.3 % (ref 11.2–14.5)
lymph#: 2.5 10*3/uL (ref 0.9–3.3)

## 2013-05-30 LAB — COMPREHENSIVE METABOLIC PANEL (CC13)
ALT: 16 U/L (ref 0–55)
AST: 22 U/L (ref 5–34)
Albumin: 3.6 g/dL (ref 3.5–5.0)
Alkaline Phosphatase: 103 U/L (ref 40–150)
BUN: 20 mg/dL (ref 7.0–26.0)
Chloride: 100 mEq/L (ref 98–109)
Potassium: 3.8 mEq/L (ref 3.5–5.1)
Sodium: 133 mEq/L — ABNORMAL LOW (ref 136–145)

## 2013-05-30 NOTE — Addendum Note (Signed)
Addended by: Laroy Apple E on: 05/30/2013 12:25 PM   Modules accepted: Orders, Medications
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
## 2013-05-30 NOTE — Telephone Encounter (Signed)
appts made and printed...td 

## 2013-08-19 ENCOUNTER — Encounter: Payer: Self-pay | Admitting: *Deleted

## 2013-12-01 ENCOUNTER — Encounter: Payer: Self-pay | Admitting: Gynecology

## 2013-12-06 ENCOUNTER — Other Ambulatory Visit: Payer: Medicare Other

## 2013-12-06 ENCOUNTER — Ambulatory Visit: Payer: Medicare Other | Admitting: Physician Assistant

## 2013-12-07 ENCOUNTER — Ambulatory Visit (HOSPITAL_BASED_OUTPATIENT_CLINIC_OR_DEPARTMENT_OTHER): Payer: Medicare Other | Admitting: Physician Assistant

## 2013-12-07 ENCOUNTER — Telehealth: Payer: Self-pay | Admitting: Oncology

## 2013-12-07 ENCOUNTER — Other Ambulatory Visit (HOSPITAL_BASED_OUTPATIENT_CLINIC_OR_DEPARTMENT_OTHER): Payer: Medicare Other

## 2013-12-07 ENCOUNTER — Encounter: Payer: Self-pay | Admitting: Physician Assistant

## 2013-12-07 VITALS — BP 126/76 | HR 71 | Temp 98.4°F | Resp 20 | Ht 66.0 in | Wt 163.6 lb

## 2013-12-07 DIAGNOSIS — C50219 Malignant neoplasm of upper-inner quadrant of unspecified female breast: Secondary | ICD-10-CM

## 2013-12-07 DIAGNOSIS — C50919 Malignant neoplasm of unspecified site of unspecified female breast: Secondary | ICD-10-CM

## 2013-12-07 DIAGNOSIS — Z170 Estrogen receptor positive status [ER+]: Secondary | ICD-10-CM

## 2013-12-07 DIAGNOSIS — K219 Gastro-esophageal reflux disease without esophagitis: Secondary | ICD-10-CM

## 2013-12-07 DIAGNOSIS — M899 Disorder of bone, unspecified: Secondary | ICD-10-CM

## 2013-12-07 DIAGNOSIS — R232 Flushing: Secondary | ICD-10-CM

## 2013-12-07 DIAGNOSIS — M8580 Other specified disorders of bone density and structure, unspecified site: Secondary | ICD-10-CM

## 2013-12-07 DIAGNOSIS — C50212 Malignant neoplasm of upper-inner quadrant of left female breast: Secondary | ICD-10-CM

## 2013-12-07 DIAGNOSIS — M949 Disorder of cartilage, unspecified: Secondary | ICD-10-CM

## 2013-12-07 LAB — CBC WITH DIFFERENTIAL/PLATELET
BASO%: 0.9 % (ref 0.0–2.0)
Basophils Absolute: 0.1 10*3/uL (ref 0.0–0.1)
EOS%: 3.8 % (ref 0.0–7.0)
Eosinophils Absolute: 0.3 10*3/uL (ref 0.0–0.5)
HCT: 37.5 % (ref 34.8–46.6)
HGB: 12.5 g/dL (ref 11.6–15.9)
LYMPH#: 2.5 10*3/uL (ref 0.9–3.3)
LYMPH%: 29.3 % (ref 14.0–49.7)
MCH: 31.3 pg (ref 25.1–34.0)
MCHC: 33.3 g/dL (ref 31.5–36.0)
MCV: 94 fL (ref 79.5–101.0)
MONO#: 0.9 10*3/uL (ref 0.1–0.9)
MONO%: 10.7 % (ref 0.0–14.0)
NEUT#: 4.8 10*3/uL (ref 1.5–6.5)
NEUT%: 55.3 % (ref 38.4–76.8)
Platelets: 284 10*3/uL (ref 145–400)
RBC: 3.99 10*6/uL (ref 3.70–5.45)
RDW: 13.2 % (ref 11.2–14.5)
WBC: 8.6 10*3/uL (ref 3.9–10.3)

## 2013-12-07 MED ORDER — TAMOXIFEN CITRATE 20 MG PO TABS: 20.0000 mg | ORAL_TABLET | Freq: Every day | ORAL | Status: DC

## 2013-12-07 NOTE — Telephone Encounter (Signed)
per pof to sch pt appt-gave pt copy of sch °
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
## 2014-02-16 ENCOUNTER — Encounter: Payer: Self-pay | Admitting: Gynecology

## 2014-02-16 ENCOUNTER — Ambulatory Visit (INDEPENDENT_AMBULATORY_CARE_PROVIDER_SITE_OTHER): Payer: Medicare Other | Admitting: Gynecology

## 2014-02-16 VITALS — BP 122/76 | Ht 65.0 in | Wt 158.0 lb

## 2014-02-16 DIAGNOSIS — N8189 Other female genital prolapse: Secondary | ICD-10-CM

## 2014-02-16 DIAGNOSIS — M949 Disorder of cartilage, unspecified: Secondary | ICD-10-CM

## 2014-02-16 DIAGNOSIS — M8580 Other specified disorders of bone density and structure, unspecified site: Secondary | ICD-10-CM

## 2014-02-16 DIAGNOSIS — M899 Disorder of bone, unspecified: Secondary | ICD-10-CM

## 2014-02-16 DIAGNOSIS — N952 Postmenopausal atrophic vaginitis: Secondary | ICD-10-CM

## 2014-02-16 NOTE — Patient Instructions (Signed)
Followup in one year for annual exam, sooner if any issues.  You may obtain a copy of any labs that were done today by logging onto MyChart as outlined in the instructions provided with your AVS (after visit summary). The office will not call with normal lab results but certainly if there are any significant abnormalities then we will contact you.   Health Maintenance, Female A healthy lifestyle and preventative care can promote health and wellness.  Maintain regular health, dental, and eye exams.  Eat a healthy diet. Foods like vegetables, fruits, whole grains, low-fat dairy products, and lean protein foods contain the nutrients you need without too many calories. Decrease your intake of foods high in solid fats, added sugars, and salt. Get information about a proper diet from your caregiver, if necessary.  Regular physical exercise is one of the most important things you can do for your health. Most adults should get at least 150 minutes of moderate-intensity exercise (any activity that increases your heart rate and causes you to sweat) each week. In addition, most adults need muscle-strengthening exercises on 2 or more days a week.   Maintain a healthy weight. The body mass index (BMI) is a screening tool to identify possible weight problems. It provides an estimate of body fat based on height and weight. Your caregiver can help determine your BMI, and can help you achieve or maintain a healthy weight. For adults 20 years and older:  A BMI below 18.5 is considered underweight.  A BMI of 18.5 to 24.9 is normal.  A BMI of 25 to 29.9 is considered overweight.  A BMI of 30 and above is considered obese.  Maintain normal blood lipids and cholesterol by exercising and minimizing your intake of saturated fat. Eat a balanced diet with plenty of fruits and vegetables. Blood tests for lipids and cholesterol should begin at age 20 and be repeated every 5 years. If your lipid or cholesterol levels are  high, you are over 50, or you are a high risk for heart disease, you may need your cholesterol levels checked more frequently.Ongoing high lipid and cholesterol levels should be treated with medicines if diet and exercise are not effective.  If you smoke, find out from your caregiver how to quit. If you do not use tobacco, do not start.  Lung cancer screening is recommended for adults aged 55 80 years who are at high risk for developing lung cancer because of a history of smoking. Yearly low-dose computed tomography (CT) is recommended for people who have at least a 30-pack-year history of smoking and are a current smoker or have quit within the past 15 years. A pack year of smoking is smoking an average of 1 pack of cigarettes a day for 1 year (for example: 1 pack a day for 30 years or 2 packs a day for 15 years). Yearly screening should continue until the smoker has stopped smoking for at least 15 years. Yearly screening should also be stopped for people who develop a health problem that would prevent them from having lung cancer treatment.  If you are pregnant, do not drink alcohol. If you are breastfeeding, be very cautious about drinking alcohol. If you are not pregnant and choose to drink alcohol, do not exceed 1 drink per day. One drink is considered to be 12 ounces (355 mL) of beer, 5 ounces (148 mL) of wine, or 1.5 ounces (44 mL) of liquor.  Avoid use of street drugs. Do not share needles with   anyone. Ask for help if you need support or instructions about stopping the use of drugs.  High blood pressure causes heart disease and increases the risk of stroke. Blood pressure should be checked at least every 1 to 2 years. Ongoing high blood pressure should be treated with medicines, if weight loss and exercise are not effective.  If you are 55 to 79 years old, ask your caregiver if you should take aspirin to prevent strokes.  Diabetes screening involves taking a blood sample to check your fasting  blood sugar level. This should be done once every 3 years, after age 45, if you are within normal weight and without risk factors for diabetes. Testing should be considered at a younger age or be carried out more frequently if you are overweight and have at least 1 risk factor for diabetes.  Breast cancer screening is essential preventative care for women. You should practice "breast self-awareness." This means understanding the normal appearance and feel of your breasts and may include breast self-examination. Any changes detected, no matter how small, should be reported to a caregiver. Women in their 20s and 30s should have a clinical breast exam (CBE) by a caregiver as part of a regular health exam every 1 to 3 years. After age 40, women should have a CBE every year. Starting at age 40, women should consider having a mammogram (breast X-ray) every year. Women who have a family history of breast cancer should talk to their caregiver about genetic screening. Women at a high risk of breast cancer should talk to their caregiver about having an MRI and a mammogram every year.  Breast cancer gene (BRCA)-related cancer risk assessment is recommended for women who have family members with BRCA-related cancers. BRCA-related cancers include breast, ovarian, tubal, and peritoneal cancers. Having family members with these cancers may be associated with an increased risk for harmful changes (mutations) in the breast cancer genes BRCA1 and BRCA2. Results of the assessment will determine the need for genetic counseling and BRCA1 and BRCA2 testing.  The Pap test is a screening test for cervical cancer. Women should have a Pap test starting at age 21. Between ages 21 and 29, Pap tests should be repeated every 2 years. Beginning at age 30, you should have a Pap test every 3 years as long as the past 3 Pap tests have been normal. If you had a hysterectomy for a problem that was not cancer or a condition that could lead to  cancer, then you no longer need Pap tests. If you are between ages 65 and 70, and you have had normal Pap tests going back 10 years, you no longer need Pap tests. If you have had past treatment for cervical cancer or a condition that could lead to cancer, you need Pap tests and screening for cancer for at least 20 years after your treatment. If Pap tests have been discontinued, risk factors (such as a new sexual partner) need to be reassessed to determine if screening should be resumed. Some women have medical problems that increase the chance of getting cervical cancer. In these cases, your caregiver may recommend more frequent screening and Pap tests.  The human papillomavirus (HPV) test is an additional test that may be used for cervical cancer screening. The HPV test looks for the virus that can cause the cell changes on the cervix. The cells collected during the Pap test can be tested for HPV. The HPV test could be used to screen women aged 30   years and older, and should be used in women of any age who have unclear Pap test results. After the age of 30, women should have HPV testing at the same frequency as a Pap test.  Colorectal cancer can be detected and often prevented. Most routine colorectal cancer screening begins at the age of 50 and continues through age 75. However, your caregiver may recommend screening at an earlier age if you have risk factors for colon cancer. On a yearly basis, your caregiver may provide home test kits to check for hidden blood in the stool. Use of a small camera at the end of a tube, to directly examine the colon (sigmoidoscopy or colonoscopy), can detect the earliest forms of colorectal cancer. Talk to your caregiver about this at age 50, when routine screening begins. Direct examination of the colon should be repeated every 5 to 10 years through age 75, unless early forms of pre-cancerous polyps or small growths are found.  Hepatitis C blood testing is recommended for  all people born from 1945 through 1965 and any individual with known risks for hepatitis C.  Practice safe sex. Use condoms and avoid high-risk sexual practices to reduce the spread of sexually transmitted infections (STIs). Sexually active women aged 25 and younger should be checked for Chlamydia, which is a common sexually transmitted infection. Older women with new or multiple partners should also be tested for Chlamydia. Testing for other STIs is recommended if you are sexually active and at increased risk.  Osteoporosis is a disease in which the bones lose minerals and strength with aging. This can result in serious bone fractures. The risk of osteoporosis can be identified using a bone density scan. Women ages 65 and over and women at risk for fractures or osteoporosis should discuss screening with their caregivers. Ask your caregiver whether you should be taking a calcium supplement or vitamin D to reduce the rate of osteoporosis.  Menopause can be associated with physical symptoms and risks. Hormone replacement therapy is available to decrease symptoms and risks. You should talk to your caregiver about whether hormone replacement therapy is right for you.  Use sunscreen. Apply sunscreen liberally and repeatedly throughout the day. You should seek shade when your shadow is shorter than you. Protect yourself by wearing long sleeves, pants, a wide-brimmed hat, and sunglasses year round, whenever you are outdoors.  Notify your caregiver of new moles or changes in moles, especially if there is a change in shape or color. Also notify your caregiver if a mole is larger than the size of a pencil eraser.  Stay current with your immunizations. Document Released: 01/12/2011 Document Revised: 10/24/2012 Document Reviewed: 01/12/2011 [employer] Patient Information 2014 [employer].   

## 2014-02-16 NOTE — Progress Notes (Signed)
Amy Kline 1930/01/07 [id number]        80 y.o.  [id number] for followup exam. Several issues noted below.  Past medical history,surgical history, problem list, medications, allergies, family history and social history were all reviewed and documented as reviewed in the EPIC chart.  ROS:  12 system ROS performed with pertinent positives and negatives included in the history, assessment and plan.   Additional significant findings :  None   Exam: Amy Kline Vitals:   02/16/14 1551  BP: 122/76  Height: 5\' 5"  (1.651 m)  Weight: 158 lb (71.668 kg)   General appearance:  Normal affect, orientation and appearance. Skin: Grossly normal HEENT: Without gross lesions.  No cervical or supraclavicular adenopathy. Thyroid normal.  Lungs:  Clear without wheezing, rales or rhonchi Cardiac: RR, without RMG Abdominal:  Soft, nontender, without masses, guarding, rebound, organomegaly or hernia Breasts:  Examined lying and sitting. Right without masses, retractions, discharge or axillary adenopathy. Left chest wall normal without palpable masses or axillary adenopathy. Pelvic:  Ext/BUS/vagina with generalized atrophic changes. Second to third degree cystocele. First degree uterine prolapse. First to second degree rectocele  Cervix atrophic  Uterus axial to anteverted, normal size, shape and contour, midline and mobile nontender   Adnexa  Without masses or tenderness    Anus and perineum  Normal   Rectovaginal  Normal sphincter tone without palpated masses or tenderness.    Assessment/Plan:  81 y.o. [id number] female for followup exam.   1. Pelvic relaxation with cystocele/rectocele/uterine prolapse. Patient has some pressure symptoms that are stable. I again reviewed options to include observation, pessary and surgery. Patient is not interested in intervention but prefers to monitor. Will check urinalysis. 2. Osteopenia. DEXA 01/2013 T score -2.1 FRAX 24%/6.7%. Was overall improved from her  prior DEXA. I discussed with her last year options for treatment and her increased fracture risk. At that time patient declined treatment as she does today. She is on tamoxifen which hopefully will help. Patient will repeat DEXA next year at two-year interval. No history of falls or fractures. 3. Postmenopausal/atrophic genital changes. Patient without significant symptoms such as hot flushes, night sweats, vaginal dryness. Will continue to monitor. Report any vaginal bleeding 4. Breast cancer. Patient actively being followed by her oncologist. Mammogram 11/2013. Continue with annual mammography. SBE monthly reviewed. 5. Pap smear 2012. No Pap smear done today. No history of significant abnormal Pap smears. Reviewed current screening guidelines and we are both comfortable with stop screening. 6. Colonoscopy 12 years ago. Patient never followed up with her gastroenterologist as I had suggested previously. I again recommended she followup with them to see if they want to continue with colonoscopies. 7. Health maintenance. No routine blood work done as this is done at her primary physician's office. Followup one year, sooner as needed.   Note: This document was prepared with digital dictation and possible smart phrase technology. Any transcriptional errors that result from this process are unintentional.   Anastasio Auerbach MD, 4:32 PM 02/16/2014

## 2014-04-23 ENCOUNTER — Other Ambulatory Visit: Payer: Self-pay | Admitting: *Deleted

## 2014-04-23 DIAGNOSIS — C50212 Malignant neoplasm of upper-inner quadrant of left female breast: Secondary | ICD-10-CM

## 2014-04-23 MED ORDER — TAMOXIFEN CITRATE 20 MG PO TABS: 20.0000 mg | ORAL_TABLET | Freq: Every day | ORAL | Status: DC

## 2014-05-01 ENCOUNTER — Encounter: Payer: Self-pay | Admitting: *Deleted

## 2014-05-14 ENCOUNTER — Encounter: Payer: Self-pay | Admitting: Gynecology

## 2014-06-14 ENCOUNTER — Telehealth: Payer: Self-pay | Admitting: Oncology

## 2014-06-14 ENCOUNTER — Ambulatory Visit (HOSPITAL_BASED_OUTPATIENT_CLINIC_OR_DEPARTMENT_OTHER): Payer: Medicare Other | Admitting: Oncology

## 2014-06-14 ENCOUNTER — Other Ambulatory Visit (HOSPITAL_BASED_OUTPATIENT_CLINIC_OR_DEPARTMENT_OTHER): Payer: Medicare Other

## 2014-06-14 VITALS — BP 117/68 | HR 80 | Temp 97.6°F | Resp 18 | Ht 65.0 in | Wt 158.3 lb

## 2014-06-14 DIAGNOSIS — M8580 Other specified disorders of bone density and structure, unspecified site: Secondary | ICD-10-CM

## 2014-06-14 DIAGNOSIS — Z170 Estrogen receptor positive status [ER+]: Secondary | ICD-10-CM

## 2014-06-14 DIAGNOSIS — C50212 Malignant neoplasm of upper-inner quadrant of left female breast: Secondary | ICD-10-CM

## 2014-06-14 LAB — COMPREHENSIVE METABOLIC PANEL (CC13)
ALT: 17 U/L (ref 0–55)
ANION GAP: 9 meq/L (ref 3–11)
AST: 18 U/L (ref 5–34)
Albumin: 3.5 g/dL (ref 3.5–5.0)
Alkaline Phosphatase: 112 U/L (ref 40–150)
BUN: 27.9 mg/dL — AB (ref 7.0–26.0)
CALCIUM: 10.5 mg/dL — AB (ref 8.4–10.4)
CHLORIDE: 95 meq/L — AB (ref 98–109)
CO2: 25 meq/L (ref 22–29)
Creatinine: 1 mg/dL (ref 0.6–1.1)
EGFR: 55 mL/min/{1.73_m2} — ABNORMAL LOW (ref >90)
Glucose: 105 mg/dl (ref 70–140)
Potassium: 4.7 mEq/L (ref 3.5–5.1)
Sodium: 128 mEq/L — ABNORMAL LOW (ref 136–145)
Total Bilirubin: 0.38 mg/dL (ref 0.20–1.20)
Total Protein: 6.8 g/dL (ref 6.4–8.3)

## 2014-06-14 LAB — CBC WITH DIFFERENTIAL/PLATELET
BASO%: 1.6 % (ref 0.0–2.0)
Basophils Absolute: 0.1 10*3/uL (ref 0.0–0.1)
EOS%: 2.8 % (ref 0.0–7.0)
Eosinophils Absolute: 0.3 10*3/uL (ref 0.0–0.5)
HEMATOCRIT: 39.4 % (ref 34.8–46.6)
HGB: 13.8 g/dL (ref 11.6–15.9)
LYMPH%: 30 % (ref 14.0–49.7)
MCH: 32.5 pg (ref 25.1–34.0)
MCHC: 35 g/dL (ref 31.5–36.0)
MCV: 92.9 fL (ref 79.5–101.0)
MONO#: 1 10*3/uL — AB (ref 0.1–0.9)
MONO%: 11.2 % (ref 0.0–14.0)
NEUT#: 4.8 10*3/uL (ref 1.5–6.5)
NEUT%: 54.4 % (ref 38.4–76.8)
NRBC: 0 % (ref 0–0)
Platelets: 358 10*3/uL (ref 145–400)
RBC: 4.24 10*6/uL (ref 3.70–5.45)
RDW: 13.7 % (ref 11.2–14.5)
WBC: 8.8 10*3/uL (ref 3.9–10.3)
lymph#: 2.6 10*3/uL (ref 0.9–3.3)

## 2014-06-14 MED ORDER — TAMOXIFEN CITRATE 20 MG PO TABS: 20.0000 mg | ORAL_TABLET | Freq: Every day | ORAL | Status: DC

## 2014-06-14 NOTE — Telephone Encounter (Signed)
, °
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
## 2014-06-18 NOTE — Addendum Note (Signed)
Addended by: Laureen Abrahams on: 06/18/2014 05:52 PM   Modules accepted: Orders, Medications

## 2014-08-20 ENCOUNTER — Other Ambulatory Visit: Payer: Self-pay | Admitting: Nurse Practitioner

## 2014-12-14 ENCOUNTER — Other Ambulatory Visit: Payer: Self-pay

## 2014-12-14 DIAGNOSIS — C50212 Malignant neoplasm of upper-inner quadrant of left female breast: Secondary | ICD-10-CM

## 2014-12-18 ENCOUNTER — Ambulatory Visit (HOSPITAL_BASED_OUTPATIENT_CLINIC_OR_DEPARTMENT_OTHER): Payer: Medicare Other | Admitting: Oncology

## 2014-12-18 ENCOUNTER — Telehealth: Payer: Self-pay | Admitting: Oncology

## 2014-12-18 ENCOUNTER — Other Ambulatory Visit (HOSPITAL_BASED_OUTPATIENT_CLINIC_OR_DEPARTMENT_OTHER): Payer: Medicare Other

## 2014-12-18 VITALS — BP 134/72 | HR 79 | Temp 98.4°F | Resp 18 | Ht 65.0 in | Wt 161.5 lb

## 2014-12-18 DIAGNOSIS — Z170 Estrogen receptor positive status [ER+]: Secondary | ICD-10-CM

## 2014-12-18 DIAGNOSIS — M797 Fibromyalgia: Secondary | ICD-10-CM

## 2014-12-18 DIAGNOSIS — M8580 Other specified disorders of bone density and structure, unspecified site: Secondary | ICD-10-CM

## 2014-12-18 DIAGNOSIS — Z79810 Long term (current) use of selective estrogen receptor modulators (SERMs): Secondary | ICD-10-CM

## 2014-12-18 DIAGNOSIS — C50212 Malignant neoplasm of upper-inner quadrant of left female breast: Secondary | ICD-10-CM

## 2014-12-18 DIAGNOSIS — K219 Gastro-esophageal reflux disease without esophagitis: Secondary | ICD-10-CM

## 2014-12-18 DIAGNOSIS — I10 Essential (primary) hypertension: Secondary | ICD-10-CM

## 2014-12-18 DIAGNOSIS — G4730 Sleep apnea, unspecified: Secondary | ICD-10-CM

## 2014-12-18 LAB — CBC WITH DIFFERENTIAL/PLATELET
BASO%: 0.9 % (ref 0.0–2.0)
Basophils Absolute: 0.1 10*3/uL (ref 0.0–0.1)
EOS ABS: 0.2 10*3/uL (ref 0.0–0.5)
EOS%: 2.1 % (ref 0.0–7.0)
HEMATOCRIT: 43.7 % (ref 34.8–46.6)
HGB: 14.8 g/dL (ref 11.6–15.9)
LYMPH#: 2.7 10*3/uL (ref 0.9–3.3)
LYMPH%: 29.8 % (ref 14.0–49.7)
MCH: 32 pg (ref 25.1–34.0)
MCHC: 33.9 g/dL (ref 31.5–36.0)
MCV: 94.4 fL (ref 79.5–101.0)
MONO#: 1 10*3/uL — ABNORMAL HIGH (ref 0.1–0.9)
MONO%: 10.3 % (ref 0.0–14.0)
NEUT#: 5.2 10*3/uL (ref 1.5–6.5)
NEUT%: 56.9 % (ref 38.4–76.8)
Platelets: 315 10*3/uL (ref 145–400)
RBC: 4.63 10*6/uL (ref 3.70–5.45)
RDW: 13.7 % (ref 11.2–14.5)
WBC: 9.2 10*3/uL (ref 3.9–10.3)

## 2014-12-18 LAB — COMPREHENSIVE METABOLIC PANEL (CC13)
ALBUMIN: 3.5 g/dL (ref 3.5–5.0)
ALK PHOS: 123 U/L (ref 40–150)
ALT: 15 U/L (ref 0–55)
ANION GAP: 8 meq/L (ref 3–11)
AST: 22 U/L (ref 5–34)
BUN: 20.6 mg/dL (ref 7.0–26.0)
CO2: 26 mEq/L (ref 22–29)
Calcium: 10.1 mg/dL (ref 8.4–10.4)
Chloride: 102 mEq/L (ref 98–109)
Creatinine: 0.8 mg/dL (ref 0.6–1.1)
EGFR: 69 mL/min/{1.73_m2} — ABNORMAL LOW (ref >90)
Glucose: 114 mg/dl (ref 70–140)
POTASSIUM: 5 meq/L (ref 3.5–5.1)
Sodium: 136 mEq/L (ref 136–145)
Total Bilirubin: 0.48 mg/dL (ref 0.20–1.20)
Total Protein: 7.1 g/dL (ref 6.4–8.3)

## 2014-12-18 MED ORDER — TRIAMCINOLONE ACETONIDE 55 MCG/ACT NA AERO: 2.0000 | INHALATION_SPRAY | Freq: Every day | NASAL | Status: DC
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
## 2014-12-18 NOTE — Telephone Encounter (Signed)
Gave avs & calendar for June 2017. Patient said she will call and schedule Mammo this month. She does not have her calendar to set up in office.

## 2014-12-20 ENCOUNTER — Telehealth: Payer: Self-pay

## 2014-12-20 NOTE — Telephone Encounter (Signed)
Signed order faxed to [facility].  Sent to scan.   

## 2015-01-09 ENCOUNTER — Other Ambulatory Visit: Payer: Self-pay | Admitting: Oncology

## 2015-01-09 NOTE — Telephone Encounter (Signed)
Chart reviewed.

## 2015-02-18 ENCOUNTER — Encounter: Payer: Medicare Other | Admitting: Gynecology

## 2015-02-21 ENCOUNTER — Encounter: Payer: Self-pay | Admitting: Gynecology

## 2015-02-21 ENCOUNTER — Ambulatory Visit (INDEPENDENT_AMBULATORY_CARE_PROVIDER_SITE_OTHER): Payer: Medicare Other | Admitting: Gynecology

## 2015-02-21 VITALS — BP 110/76 | Ht 65.0 in | Wt 163.0 lb

## 2015-02-21 DIAGNOSIS — N8189 Other female genital prolapse: Secondary | ICD-10-CM

## 2015-02-21 DIAGNOSIS — Z01419 Encounter for gynecological examination (general) (routine) without abnormal findings: Secondary | ICD-10-CM

## 2015-02-21 DIAGNOSIS — N952 Postmenopausal atrophic vaginitis: Secondary | ICD-10-CM | POA: Diagnosis not present

## 2015-02-21 DIAGNOSIS — M8580 Other specified disorders of bone density and structure, unspecified site: Secondary | ICD-10-CM | POA: Diagnosis not present

## 2015-02-21 NOTE — Addendum Note (Signed)
Addended by: Joaquin Music on: 02/21/2015 04:28 PM   Modules accepted: Orders

## 2015-02-21 NOTE — Progress Notes (Signed)
Amy Kline 05/13/1930 [id number]        81 y.o.  G3P3003 for breast and pelvic exam.  Past medical history,surgical history, problem list, medications, allergies, family history and social history were all reviewed and documented as reviewed in the EPIC chart.  ROS:  Performed with pertinent positives and negatives included in the history, assessment and plan.   Additional significant findings :  none   Exam: [name] Vitals:   02/21/15 1523  BP: 110/76  Height: 5\' 5"  (1.651 m)  Weight: 163 lb (73.936 kg)   General appearance:  Normal affect, orientation and appearance. Skin: Grossly normal HEENT: Without gross lesions.  No cervical or supraclavicular adenopathy. Thyroid normal.  Lungs:  Clear without wheezing, rales or rhonchi Cardiac: RR, without RMG Abdominal:  Soft, nontender, without masses, guarding, rebound, organomegaly or hernia Breasts:  Examined lying and sitting. Right without masses, retractions, discharge or axillary adenopathy.  Left status post mastectomy with chest wall exam normal with well-healed scars, no masses and no adenopathy Pelvic:  Ext/BUS/vagina with atrophic changes.  Second and third degree cystocele. First to second-degree uterine prolapse. First to second-degree rectocele.  Cervix atrophic  Uterus axial, normal size, shape and contour, midline and mobile nontender   Adnexa  Without masses or tenderness    Anus and perineum  Normal with old external hemorrhoids  Rectovaginal  Normal sphincter tone without palpated masses or tenderness.    Assessment/Plan:  81 y.o. G44P3003 female for breast and pelvic exam.   1. Postmenopausal/atrophic genital changes. Without significant hot flushes, night sweats, vaginal dryness. No vaginal bleeding. Patient knows to report any vaginal bleeding. 2. Pelvic relaxation with cystocele/rectocele/uterine prolapse. Overall doing well without significant symptoms. We have discussed options to include  observation surgery and pessary trial. Patient's comfortable with observation. 3. Osteopenia. DEXA 2014 T score -2.1 FRAX 24%/6%. Stable from prior DEXA. We discussed options for treatment and she has declined in the past.  Currently on tamoxifen. Repeat DEXA now and she will schedule. 4. Breast cancer. Exam NED. Mammography 12/2014. SBE monthly reviewed. Continue be followed up with oncology. 5. Pap smear 2012. No Pap smear done today. No history of abnormal Pap smears. We both agreed to stop screening per current screening guidelines. 6. Colonoscopy 2013. They have stopped doing colonoscopies. 7. Health maintenance. No routine blood work done as this is done at her primary physician's office. Follow up in one year, sooner as needed.   Anastasio Auerbach MD, 3:48 PM 02/21/2015

## 2015-02-21 NOTE — Patient Instructions (Signed)
Followup for bone density as scheduled. 

## 2015-03-14 ENCOUNTER — Ambulatory Visit (INDEPENDENT_AMBULATORY_CARE_PROVIDER_SITE_OTHER): Payer: Medicare Other

## 2015-03-14 DIAGNOSIS — M8580 Other specified disorders of bone density and structure, unspecified site: Secondary | ICD-10-CM

## 2015-03-14 DIAGNOSIS — M899 Disorder of bone, unspecified: Secondary | ICD-10-CM

## 2015-03-14 HISTORY — DX: Other specified disorders of bone density and structure, unspecified site: M85.80

## 2015-03-19 ENCOUNTER — Encounter: Payer: Self-pay | Admitting: Gynecology

## 2015-04-10 ENCOUNTER — Other Ambulatory Visit: Payer: Self-pay | Admitting: Oncology

## 2015-05-01 ENCOUNTER — Encounter: Payer: Self-pay | Admitting: Gastroenterology

## 2015-07-13 ENCOUNTER — Other Ambulatory Visit: Payer: Self-pay | Admitting: Oncology

## 2015-10-10 ENCOUNTER — Other Ambulatory Visit: Payer: Self-pay | Admitting: Oncology

## 2015-10-16 ENCOUNTER — Telehealth: Payer: Self-pay | Admitting: Gynecology

## 2015-10-16 DIAGNOSIS — R9389 Abnormal findings on diagnostic imaging of other specified body structures: Secondary | ICD-10-CM

## 2015-10-16 NOTE — Telephone Encounter (Signed)
Tell patient that I received a note from Dr. Matilde Sprang where they did a CT scan on her kidneys. They noted that the uterine lining looked a little thick and there is some question as to whether she would have a endometrial polyp. Recommend patient schedule sonohysterogram for better definition of the endometrial area.

## 2015-10-16 NOTE — Telephone Encounter (Signed)
Pt informed with the below note, order placed. Pt said she will call back to schedule.

## 2015-10-17 NOTE — Telephone Encounter (Signed)
11/01/15 @ 4:00pm

## 2015-10-21 ENCOUNTER — Other Ambulatory Visit: Payer: Self-pay | Admitting: Gynecology

## 2015-10-21 DIAGNOSIS — R9389 Abnormal findings on diagnostic imaging of other specified body structures: Secondary | ICD-10-CM

## 2015-10-22 DIAGNOSIS — M51369 Other intervertebral disc degeneration, lumbar region without mention of lumbar back pain or lower extremity pain: Secondary | ICD-10-CM

## 2015-10-22 DIAGNOSIS — Z8673 Personal history of transient ischemic attack (TIA), and cerebral infarction without residual deficits: Secondary | ICD-10-CM | POA: Insufficient documentation

## 2015-10-22 DIAGNOSIS — E213 Hyperparathyroidism, unspecified: Secondary | ICD-10-CM | POA: Insufficient documentation

## 2015-10-22 DIAGNOSIS — M5136 Other intervertebral disc degeneration, lumbar region: Secondary | ICD-10-CM

## 2015-10-22 DIAGNOSIS — E782 Mixed hyperlipidemia: Secondary | ICD-10-CM | POA: Insufficient documentation

## 2015-10-22 DIAGNOSIS — I499 Cardiac arrhythmia, unspecified: Secondary | ICD-10-CM | POA: Insufficient documentation

## 2015-10-22 DIAGNOSIS — H9190 Unspecified hearing loss, unspecified ear: Secondary | ICD-10-CM

## 2015-10-22 DIAGNOSIS — R5383 Other fatigue: Secondary | ICD-10-CM | POA: Insufficient documentation

## 2015-10-22 DIAGNOSIS — J309 Allergic rhinitis, unspecified: Secondary | ICD-10-CM

## 2015-10-22 DIAGNOSIS — Z79899 Other long term (current) drug therapy: Secondary | ICD-10-CM

## 2015-10-22 DIAGNOSIS — I429 Cardiomyopathy, unspecified: Secondary | ICD-10-CM

## 2015-10-22 DIAGNOSIS — R5381 Other malaise: Secondary | ICD-10-CM | POA: Insufficient documentation

## 2015-10-22 HISTORY — DX: Personal history of transient ischemic attack (TIA), and cerebral infarction without residual deficits: Z86.73

## 2015-10-22 HISTORY — DX: Cardiac arrhythmia, unspecified: I49.9

## 2015-10-22 HISTORY — DX: Hyperparathyroidism, unspecified: E21.3

## 2015-10-22 HISTORY — DX: Other malaise: R53.81

## 2015-10-22 HISTORY — DX: Other intervertebral disc degeneration, lumbar region without mention of lumbar back pain or lower extremity pain: M51.369

## 2015-10-22 HISTORY — DX: Mixed hyperlipidemia: E78.2

## 2015-10-22 HISTORY — DX: Other intervertebral disc degeneration, lumbar region: M51.36

## 2015-10-22 HISTORY — DX: Unspecified hearing loss, unspecified ear: H91.90

## 2015-10-22 HISTORY — DX: Cardiomyopathy, unspecified: I42.9

## 2015-10-22 HISTORY — DX: Allergic rhinitis, unspecified: J30.9

## 2015-10-22 HISTORY — DX: Other long term (current) drug therapy: Z79.899

## 2015-10-29 DIAGNOSIS — N2889 Other specified disorders of kidney and ureter: Secondary | ICD-10-CM

## 2015-10-29 DIAGNOSIS — R1900 Intra-abdominal and pelvic swelling, mass and lump, unspecified site: Secondary | ICD-10-CM | POA: Insufficient documentation

## 2015-10-29 HISTORY — DX: Other specified disorders of kidney and ureter: N28.89

## 2015-10-29 HISTORY — DX: Intra-abdominal and pelvic swelling, mass and lump, unspecified site: R19.00

## 2015-11-01 ENCOUNTER — Ambulatory Visit (INDEPENDENT_AMBULATORY_CARE_PROVIDER_SITE_OTHER): Payer: Medicare Other | Admitting: Gynecology

## 2015-11-01 ENCOUNTER — Ambulatory Visit (INDEPENDENT_AMBULATORY_CARE_PROVIDER_SITE_OTHER): Payer: Medicare Other

## 2015-11-01 ENCOUNTER — Other Ambulatory Visit: Payer: Self-pay | Admitting: Gynecology

## 2015-11-01 ENCOUNTER — Encounter: Payer: Self-pay | Admitting: Gynecology

## 2015-11-01 VITALS — BP 122/78

## 2015-11-01 DIAGNOSIS — N9489 Other specified conditions associated with female genital organs and menstrual cycle: Secondary | ICD-10-CM | POA: Diagnosis not present

## 2015-11-01 DIAGNOSIS — R938 Abnormal findings on diagnostic imaging of other specified body structures: Secondary | ICD-10-CM

## 2015-11-01 DIAGNOSIS — R9389 Abnormal findings on diagnostic imaging of other specified body structures: Secondary | ICD-10-CM

## 2015-11-01 NOTE — Progress Notes (Signed)
    Pura D Jaffe [birth year] [id number]        82 y.o.  G3P3003 Presents for sonohysterogram due to recent CT scan done at [facility] which reportedly showed a thickened endometrial echo. She is on tamoxifen. Has had no vaginal bleeding.  Past medical history,surgical history, problem list, medications, allergies, family history and social history were all reviewed and documented in the EPIC chart.  Directed ROS with pertinent positives and negatives documented in the history of present illness/assessment and plan.  Exam: [place] assistant There were no vitals filed for this visit. General appearance:  Normal External BUS vagina with atrophic changes. Second to third degree cystocele. First to second degree year uterine prolapse. First to second degree rectocele  Ultrasound shows uterus to be normal size and echotexture. Prominent endometrial echo with color flow 10.2 mm. Right ovary atrophic. Left ovary not visualized but no left adnexal pathology noted. Cul-de-sac negative.  Sonohysterogram performed, sterile technique, easy catheter introduction, good distention with irregular bordered intrauterine polypoid mass 20 x 12 x 14 mm. Endometrial biopsy taken. Patient tolerated well.  Assessment/Plan:  82 y.o. G3P3003 with thickened endometrial echo on ultrasound in patient on tamoxifen.Jolene Provost showed an irregular bordered mass. Endometrial biopsy taken. Will follow up for biopsy results.  Reviewed with patient and her daughters various possibilities to include benign, hyperplastic with or without atypia and endometrial carcinoma. Various follow up scenarios to include observation, hysteroscopy D&C, hysterectomy all discussed. Will follow up with biopsy results and then we will go from there.    Anastasio Auerbach MD, 4:44 PM 11/01/2015

## 2015-11-01 NOTE — Patient Instructions (Signed)
Office will call you with biopsy results 

## 2015-11-14 ENCOUNTER — Ambulatory Visit (INDEPENDENT_AMBULATORY_CARE_PROVIDER_SITE_OTHER): Payer: Medicare Other | Admitting: Gynecology

## 2015-11-14 ENCOUNTER — Encounter: Payer: Self-pay | Admitting: Gynecology

## 2015-11-14 VITALS — BP 118/76

## 2015-11-14 DIAGNOSIS — R9389 Abnormal findings on diagnostic imaging of other specified body structures: Secondary | ICD-10-CM

## 2015-11-14 DIAGNOSIS — R938 Abnormal findings on diagnostic imaging of other specified body structures: Secondary | ICD-10-CM

## 2015-11-14 NOTE — Patient Instructions (Signed)
Office will call you to arrange surgery. 

## 2015-11-14 NOTE — Progress Notes (Signed)
    Amy Kline [birth year] [id number]        82 y.o.  G3P3003 Presents with her daughters to discuss her situation.  History of CT scan through [facility] which showed a thickened endometrial echo. Patient is currently on tamoxifen. She's had no vaginal bleeding. Underwent ultrasound here 11/01/2015 which showed a prominent endometrial cavity measuring 10 mm with Doppler flow. Endometrial sample showed atrophic endometrium.  Past medical history,surgical history, problem list, medications, allergies, family history and social history were all reviewed and documented in the EPIC chart.  Directed ROS with pertinent positives and negatives documented in the history of present illness/assessment and plan.  Exam: Filed Vitals:   11/14/15 1559  BP: 118/76   General appearance:  Normal   Assessment/Plan:  82 y.o. G3P3003 with history as above. I reviewed with the patient and her 2 daughters the situation and the issues of an asymptomatic patient on tamoxifen with a thicker endometrial echo with positive Doppler flow suggesting a polyp but negative biopsy. Limitations of a blind endometrial biopsy to sample the cavity was discussed. Options for management include observation with serial measurement of the endometrial echo, possible rebiopsy or proceeding with hysteroscopy D&C for complete assessment discussed. I also reviewed the risks of surgery given her age, although she is active and healthy at 28, to include anesthetic risks cardiac, respiratory, thrombotic as well as procedure related to include infection, prolonged antibiotics, hemorrhage necessitating transfusion and the risks of transfusion reaction, hepatitis, HIV, mad cow disease and other unknown entities, uterine perforation and damage to surrounding tissues including bowel, bladder, ureters, vessels and nerves necessitating major exploratory reparative surgeries and future reparative surgeries including ostomy formation, bowel  resection, bladder repair, ureteral damage repair was all discussed. Distended media absorption leading to fluid overload or metabolic complications such as coma and seizures was also discussed. After a lengthy discussion we all agree to proceed with hysteroscopy D&C. We will have a preoperative clearance from her cardiologist and she'll represent for a preoperative visit before the procedure to answer any questions.    Anastasio Auerbach MD, 4:24 PM 11/14/2015

## 2015-11-18 ENCOUNTER — Telehealth: Payer: Self-pay

## 2015-11-18 MED ORDER — MISOPROSTOL 100 MCG PO TABS: ORAL_TABLET | ORAL | Status: DC

## 2015-11-18 NOTE — Telephone Encounter (Signed)
I contacted patient to schedule surgery. Per Dr. Loetta Rough she needs cardiac clearance prior to surgery. I called [facility] and scheduled her for first available with her cardiologist Dr. Haroldine Laws on 12/19/15 at 2:00pm.  She was informed of this. We then scheduled her surgery for 01/07/16 at 11:15am at [facility] and her pre op appt with Dr. Loetta Rough for 01/01/16 at 12:30pm.     I advised patient about the need to use Cytotec tab vaginally hs prior to surgery. I sent Rx to her pharmacy.

## 2015-11-19 ENCOUNTER — Telehealth: Payer: Self-pay

## 2015-11-19 NOTE — Telephone Encounter (Signed)
Patient called to discuss rescheduling her surgery scheduled 01/07/16. She did not recall at time of scheduling that her daughter is getting married 01/04/16 and will be on her honeymoon that week after. Her daughter wants to be here for this. We discussed dates and she decided she wants to move it to 01/21/16 and hopefully later in the morning as she lives in [place]. I explained block time and i must wait to see what cases I have for that day before I can assign her a time. I will call her to confirm 01/21/16 when I get Dr. Dorette Grate schedule for July and I will reschedule her then.

## 2015-12-16 ENCOUNTER — Other Ambulatory Visit: Payer: Self-pay | Admitting: *Deleted

## 2015-12-16 DIAGNOSIS — C50212 Malignant neoplasm of upper-inner quadrant of left female breast: Secondary | ICD-10-CM

## 2015-12-17 ENCOUNTER — Other Ambulatory Visit (HOSPITAL_BASED_OUTPATIENT_CLINIC_OR_DEPARTMENT_OTHER): Payer: Medicare Other

## 2015-12-17 ENCOUNTER — Telehealth: Payer: Self-pay | Admitting: Oncology

## 2015-12-17 ENCOUNTER — Ambulatory Visit (HOSPITAL_BASED_OUTPATIENT_CLINIC_OR_DEPARTMENT_OTHER): Payer: Medicare Other | Admitting: Oncology

## 2015-12-17 VITALS — BP 137/79 | HR 74 | Temp 97.8°F | Resp 18 | Ht 65.0 in | Wt 164.4 lb

## 2015-12-17 DIAGNOSIS — Z170 Estrogen receptor positive status [ER+]: Secondary | ICD-10-CM | POA: Diagnosis not present

## 2015-12-17 DIAGNOSIS — C50212 Malignant neoplasm of upper-inner quadrant of left female breast: Secondary | ICD-10-CM

## 2015-12-17 LAB — CBC WITH DIFFERENTIAL/PLATELET
BASO%: 0.9 % (ref 0.0–2.0)
BASOS ABS: 0.1 10*3/uL (ref 0.0–0.1)
EOS%: 3.4 % (ref 0.0–7.0)
Eosinophils Absolute: 0.3 10*3/uL (ref 0.0–0.5)
HEMATOCRIT: 41.7 % (ref 34.8–46.6)
HEMOGLOBIN: 14.2 g/dL (ref 11.6–15.9)
LYMPH#: 2.2 10*3/uL (ref 0.9–3.3)
LYMPH%: 24.7 % (ref 14.0–49.7)
MCH: 32.1 pg (ref 25.1–34.0)
MCHC: 34.1 g/dL (ref 31.5–36.0)
MCV: 94.1 fL (ref 79.5–101.0)
MONO#: 1.1 10*3/uL — ABNORMAL HIGH (ref 0.1–0.9)
MONO%: 12.5 % (ref 0.0–14.0)
NEUT%: 58.5 % (ref 38.4–76.8)
NEUTROS ABS: 5.2 10*3/uL (ref 1.5–6.5)
Platelets: 278 10*3/uL (ref 145–400)
RBC: 4.43 10*6/uL (ref 3.70–5.45)
RDW: 13.7 % (ref 11.2–14.5)
WBC: 8.9 10*3/uL (ref 3.9–10.3)

## 2015-12-17 LAB — COMPREHENSIVE METABOLIC PANEL
ALBUMIN: 3.5 g/dL (ref 3.5–5.0)
ALK PHOS: 109 U/L (ref 40–150)
ALT: 11 U/L (ref 0–55)
AST: 18 U/L (ref 5–34)
Anion Gap: 5 mEq/L (ref 3–11)
BUN: 19 mg/dL (ref 7.0–26.0)
CALCIUM: 10.1 mg/dL (ref 8.4–10.4)
CO2: 25 mEq/L (ref 22–29)
CREATININE: 0.9 mg/dL (ref 0.6–1.1)
Chloride: 104 mEq/L (ref 98–109)
EGFR: 63 mL/min/{1.73_m2} — ABNORMAL LOW (ref >90)
GLUCOSE: 95 mg/dL (ref 70–140)
Potassium: 5.3 mEq/L — ABNORMAL HIGH (ref 3.5–5.1)
Sodium: 135 mEq/L — ABNORMAL LOW (ref 136–145)
TOTAL PROTEIN: 6.9 g/dL (ref 6.4–8.3)
Total Bilirubin: 0.38 mg/dL (ref 0.20–1.20)

## 2015-12-17 NOTE — Telephone Encounter (Signed)
appt made and avs printed °
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
## 2015-12-19 ENCOUNTER — Ambulatory Visit (HOSPITAL_COMMUNITY)
Admission: RE | Admit: 2015-12-19 | Discharge: 2015-12-19 | Disposition: A | Discharge status: Home / Self Care | Payer: Medicare Other | Source: Ambulatory Visit | Attending: Internal Medicine | Admitting: Internal Medicine

## 2015-12-19 ENCOUNTER — Encounter (HOSPITAL_COMMUNITY): Payer: Self-pay | Admitting: Internal Medicine

## 2015-12-19 VITALS — BP 148/70 | HR 72 | Wt 163.5 lb

## 2015-12-19 DIAGNOSIS — Z853 Personal history of malignant neoplasm of breast: Secondary | ICD-10-CM | POA: Diagnosis not present

## 2015-12-19 DIAGNOSIS — M797 Fibromyalgia: Secondary | ICD-10-CM | POA: Insufficient documentation

## 2015-12-19 DIAGNOSIS — C50212 Malignant neoplasm of upper-inner quadrant of left female breast: Secondary | ICD-10-CM | POA: Diagnosis not present

## 2015-12-19 DIAGNOSIS — Z7982 Long term (current) use of aspirin: Secondary | ICD-10-CM | POA: Diagnosis not present

## 2015-12-19 DIAGNOSIS — Z7951 Long term (current) use of inhaled steroids: Secondary | ICD-10-CM | POA: Diagnosis not present

## 2015-12-19 DIAGNOSIS — Z8249 Family history of ischemic heart disease and other diseases of the circulatory system: Secondary | ICD-10-CM | POA: Diagnosis not present

## 2015-12-19 DIAGNOSIS — Z882 Allergy status to sulfonamides status: Secondary | ICD-10-CM | POA: Diagnosis not present

## 2015-12-19 DIAGNOSIS — Z7189 Other specified counseling: Secondary | ICD-10-CM | POA: Insufficient documentation

## 2015-12-19 DIAGNOSIS — Z01810 Encounter for preprocedural cardiovascular examination: Secondary | ICD-10-CM

## 2015-12-19 DIAGNOSIS — R938 Abnormal findings on diagnostic imaging of other specified body structures: Secondary | ICD-10-CM | POA: Diagnosis not present

## 2015-12-19 DIAGNOSIS — K219 Gastro-esophageal reflux disease without esophagitis: Secondary | ICD-10-CM | POA: Diagnosis not present

## 2015-12-19 DIAGNOSIS — I10 Essential (primary) hypertension: Secondary | ICD-10-CM

## 2015-12-19 DIAGNOSIS — Z9012 Acquired absence of left breast and nipple: Secondary | ICD-10-CM | POA: Diagnosis not present

## 2015-12-19 DIAGNOSIS — Z79899 Other long term (current) drug therapy: Secondary | ICD-10-CM | POA: Diagnosis not present

## 2015-12-19 NOTE — Addendum Note (Signed)
Encounter addended by: Effie Berkshire, RN on: 12/19/2015  2:52 PM<BR>     Documentation filed: Visit Diagnoses, Dx Association, Patient Instructions Section, Orders
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
## 2015-12-19 NOTE — Patient Instructions (Signed)
Will schedule you for a Lexiscan myoview at [facility]. Address: [address] ([address] Floor), [place], [place] [address]  Phone: [phone] Please wear comfortable clothes and shoes for this test. Avoid heavy meal before the test (light snack/meal recommended). Avoid caffeine, alcohol, tobacco products 8 hrs before test. Please give 24 hr notice for cancellations/rescheduling: [id number]  Follow up as needed for any cardiac questions/concerns! [phone] Option 5 to speak to our triage nurse or make another appointment

## 2016-01-01 ENCOUNTER — Ambulatory Visit: Payer: Medicare Other | Admitting: Gynecology

## 2016-01-07 ENCOUNTER — Telehealth (HOSPITAL_COMMUNITY): Payer: Self-pay | Admitting: *Deleted

## 2016-01-07 NOTE — Telephone Encounter (Signed)
Patient given detailed instructions per Myocardial Perfusion Study Information Sheet for the test on 01/10/16. Patient notified to arrive 15 minutes early and that it is imperative to arrive on time for appointment to keep from having the test rescheduled.  If you need to cancel or reschedule your appointment, please call the office within 24 hours of your appointment. Failure to do so may result in a cancellation of your appointment, and a $50 no show fee. Patient verbalized understanding. Hubbard Robinson, RN

## 2016-01-08 NOTE — Patient Instructions (Signed)
Your procedure is scheduled on:  Tuesday, January 21, 2016  Enter through the Main Entrance of [facility] at:  7:30 AM  Pick up the phone at the desk and dial [phone].  Call this number if you have problems the morning of surgery: [phone].  Remember: Do NOT eat food or drink after:  Midnight Monday, January 20, 2016  Take these medicines the morning of surgery with a SIP OF WATER:  Carvedilol, Savella  Do NOT wear jewelry (body piercing), metal hair clips/bobby pins, make-up, or nail polish. Do NOT wear lotions, powders, or perfumes.  You may wear deodorant. Do NOT shave for 48 hours prior to surgery. Do NOT bring valuables to the hospital. Contacts, dentures, or bridgework may not be worn into surgery.  Have a responsible adult drive you home and stay with you for 24 hours after your procedure

## 2016-01-09 ENCOUNTER — Encounter (HOSPITAL_COMMUNITY): Payer: Self-pay

## 2016-01-09 DIAGNOSIS — N840 Polyp of corpus uteri: Secondary | ICD-10-CM | POA: Diagnosis not present

## 2016-01-09 DIAGNOSIS — Z01812 Encounter for preprocedural laboratory examination: Secondary | ICD-10-CM | POA: Diagnosis present

## 2016-01-09 DIAGNOSIS — C50212 Malignant neoplasm of upper-inner quadrant of left female breast: Secondary | ICD-10-CM | POA: Diagnosis not present

## 2016-01-09 LAB — COMPREHENSIVE METABOLIC PANEL
ALBUMIN: 4 g/dL (ref 3.5–5.0)
ALT: 18 U/L (ref 14–54)
ANION GAP: 6 (ref 5–15)
AST: 22 U/L (ref 15–41)
Alkaline Phosphatase: 100 U/L (ref 38–126)
BUN: 22 mg/dL — ABNORMAL HIGH (ref 6–20)
CO2: 27 mmol/L (ref 22–32)
Calcium: 10.3 mg/dL (ref 8.9–10.3)
Chloride: 100 mmol/L — ABNORMAL LOW (ref 101–111)
Creatinine, Ser: 0.79 mg/dL (ref 0.44–1.00)
GFR calc Af Amer: >60 mL/min (ref >60)
GFR calc non Af Amer: >60 mL/min (ref >60)
GLUCOSE: 111 mg/dL — AB (ref 65–99)
POTASSIUM: 4.7 mmol/L (ref 3.5–5.1)
SODIUM: 133 mmol/L — AB (ref 135–145)
Total Bilirubin: 0.8 mg/dL (ref 0.3–1.2)
Total Protein: 7.5 g/dL (ref 6.5–8.1)

## 2016-01-09 LAB — CBC
HCT: 42 % (ref 36.0–46.0)
HEMOGLOBIN: 14.8 g/dL (ref 12.0–15.0)
MCH: 32.3 pg (ref 26.0–34.0)
MCHC: 35.2 g/dL (ref 30.0–36.0)
MCV: 91.7 fL (ref 78.0–100.0)
Platelets: 284 10*3/uL (ref 150–400)
RBC: 4.58 MIL/uL (ref 3.87–5.11)
RDW: 13.5 % (ref 11.5–15.5)
WBC: 9.2 10*3/uL (ref 4.0–10.5)

## 2016-01-10 ENCOUNTER — Encounter (HOSPITAL_COMMUNITY)
Admission: RE | Admit: 2016-01-10 | Discharge: 2016-01-10 | Disposition: A | Discharge status: Home / Self Care | Payer: Medicare Other | Source: Ambulatory Visit | Attending: Gynecology | Admitting: Gynecology

## 2016-01-10 ENCOUNTER — Other Ambulatory Visit: Payer: Self-pay | Admitting: Gynecology

## 2016-01-10 ENCOUNTER — Ambulatory Visit (HOSPITAL_BASED_OUTPATIENT_CLINIC_OR_DEPARTMENT_OTHER): Payer: Medicare Other

## 2016-01-10 DIAGNOSIS — N840 Polyp of corpus uteri: Secondary | ICD-10-CM | POA: Insufficient documentation

## 2016-01-10 DIAGNOSIS — Z01812 Encounter for preprocedural laboratory examination: Secondary | ICD-10-CM | POA: Diagnosis not present

## 2016-01-10 DIAGNOSIS — C50212 Malignant neoplasm of upper-inner quadrant of left female breast: Secondary | ICD-10-CM

## 2016-01-10 DIAGNOSIS — E871 Hypo-osmolality and hyponatremia: Secondary | ICD-10-CM

## 2016-01-10 HISTORY — DX: Urinary tract infection, site not specified: N39.0

## 2016-01-10 HISTORY — DX: Reserved for inherently not codable concepts without codable children: IMO0001

## 2016-01-10 HISTORY — DX: Dizziness and giddiness: R42

## 2016-01-10 HISTORY — DX: Other specified disorders of parathyroid gland: E21.4

## 2016-01-10 HISTORY — DX: Cyst of kidney, acquired: N28.1

## 2016-01-10 HISTORY — DX: Pneumonia, unspecified organism: J18.9

## 2016-01-10 HISTORY — DX: Peripheral vascular disease, unspecified: I73.9

## 2016-01-10 HISTORY — DX: Palpitations: R00.2

## 2016-01-10 LAB — MYOCARDIAL PERFUSION IMAGING
CHL CUP NUCLEAR SDS: 2
CHL CUP NUCLEAR SRS: 11
CHL CUP NUCLEAR SSS: 13
CSEPPHR: 83 {beats}/min
LHR: 0.32
LV dias vol: 66 mL (ref 46–106)
LV sys vol: 13 mL
Rest HR: 64 {beats}/min
TID: 0.91

## 2016-01-10 MED ORDER — REGADENOSON 0.4 MG/5ML IV SOLN
0.4000 mg | Freq: Once | INTRAVENOUS | Status: AC
  Administered 2016-01-10: 0.4 mg via INTRAVENOUS

## 2016-01-10 MED ORDER — TECHNETIUM TC 99M TETROFOSMIN IV KIT
33.0000 | PACK | Freq: Once | INTRAVENOUS | Status: AC | PRN
  Administered 2016-01-10: 33 via INTRAVENOUS
  Filled 2016-01-10: qty 33

## 2016-01-10 MED ORDER — TECHNETIUM TC 99M TETROFOSMIN IV KIT
10.1000 | PACK | Freq: Once | INTRAVENOUS | Status: AC | PRN
  Administered 2016-01-10: 10.1 via INTRAVENOUS
  Filled 2016-01-10: qty 10

## 2016-01-15 ENCOUNTER — Ambulatory Visit (INDEPENDENT_AMBULATORY_CARE_PROVIDER_SITE_OTHER): Payer: Medicare Other | Admitting: Gynecology

## 2016-01-15 ENCOUNTER — Encounter: Payer: Self-pay | Admitting: Gynecology

## 2016-01-15 VITALS — BP 124/80 | Ht 65.0 in | Wt 164.0 lb

## 2016-01-15 DIAGNOSIS — R9389 Abnormal findings on diagnostic imaging of other specified body structures: Secondary | ICD-10-CM

## 2016-01-15 DIAGNOSIS — R938 Abnormal findings on diagnostic imaging of other specified body structures: Secondary | ICD-10-CM | POA: Diagnosis not present

## 2016-01-15 LAB — ELECTROLYTE PANEL
CHLORIDE: 102 mmol/L (ref 98–110)
CO2: 23 mmol/L (ref 20–31)
Potassium: 5 mmol/L (ref 3.5–5.3)
SODIUM: 134 mmol/L — AB (ref 135–146)

## 2016-01-15 NOTE — H&P (Signed)
  Iysis D Appleman [birth year] [id number]   History and Physical  Chief complaint: Thickened endometrial echo  History of present illness: 82 y.o. G3P3003 with history of CT scan through her urologist which showed a thickened endometrial echo. Underwent ultrasound 10/2015 which showed a prominent endometrial echo at 10 mm with color flow. Sonohysterogram showed an irregular endometrial border with a polypoid thickening a 20 x 14 x 12 mm. Endometrial sample showed atrophic endometrium. Patient currently on tamoxifen. Patient is admitted for hysteroscopy D&C for complete sampling of the endometrial cavity.  Past medical history,surgical history, medications, allergies, family history and social history were all reviewed and documented in the EPIC chart.  ROS:  Was performed and pertinent positives and negatives are included in the history of present illness.  Exam:  Wandra Scot assistant 01/15/2016 Filed Vitals:   01/15/16 1502  BP: 124/80  Height: 5\' 5"  (1.651 m)  Weight: 164 lb (74.39 kg)   General: well developed, well nourished female, no acute distress HEENT: normal  Lungs: clear to auscultation without wheezing, rales or rhonchi  Cardiac: regular rate without rubs, murmurs or gallops  Abdomen: soft, nontender without masses, guarding, rebound, organomegaly  Pelvic: external bus vagina: normal with atrophic changes. Second and third degree cystocele, first-degree to second-degree uterine prolapse and rectocele. Cervix: grossly normal with atrophic changes Uterus: normal size, midline and mobile, nontender  Adnexa: without masses or tenderness    Assessment/Plan:  82 y.o. [id number] with history as above. I reviewed with the patient and her 2 daughters the situation and the issues of an asymptomatic patient on tamoxifen with a thicker endometrial echo with positive Doppler flow suggesting a polyp but negative biopsy. I discussed this with them previously as noted in my 11/14/2015 note. At  that point we decided to proceed with hysteroscopy D&C for complete endometrial sampling. She has undergone a cardiac clearance. I can reviewed the proposed surgery with the patient and her daughters to include the expected intraoperative and postoperative courses as well as the recovery period. The use of the hysteroscope, resectoscope and the D&C portion were all discussed. The risks of surgery to include infection, prolonged antibiotics, hemorrhage necessitating transfusion and the risks of transfusion, including transfusion reaction, hepatitis, HIV, mad cow disease and other unknown entities were all discussed understood and accepted. The risk of damage to internal organs during the procedure, either immediately recognized or delay recognized, including vagina, cervix, uterus, possible perforation causing damage to bowel, bladder, ureters, vessels and nerves necessitating major exploratory reparative surgery and future reparative surgeries including bladder repair, ureteral damage repair, bowel resection, ostomy formation was also discussed understood and accepted. The potential for distended media absorption leading to metabolic complications such as fluid overload, coma and seizures was also discussed understood and accepted. The patient's questions were answered to her satisfaction and she is ready to proceed with surgery.    Anastasio Auerbach MD, 4:06 PM 01/15/2016

## 2016-01-15 NOTE — Progress Notes (Signed)
Amy Kline 16-Oct-1929 [id number]   Preoperative consult  Chief complaint: Thickened endometrial echo  History of present illness: 82 y.o. G3P3003 with history of CT scan through her urologist which showed a thickened endometrial echo. Underwent ultrasound 10/2015 which showed a prominent endometrial echo at 10 mm with color flow. Sonohysterogram showed an irregular endometrial border with a polypoid thickening a 20 x 14 x 12 mm. Endometrial sample showed atrophic endometrium. Patient currently on tamoxifen. Patient is admitted for hysteroscopy D&C for complete sampling of the endometrial cavity.  Past medical history,surgical history, medications, allergies, family history and social history were all reviewed and documented in the EPIC chart.  ROS:  Was performed and pertinent positives and negatives are included in the history of present illness.  Exam:  Wandra Scot assistant Filed Vitals:   01/15/16 1502  BP: 124/80  Height: 5\' 5"  (1.651 m)  Weight: 164 lb (74.39 kg)   General: well developed, well nourished female, no acute distress HEENT: normal  Lungs: clear to auscultation without wheezing, rales or rhonchi  Cardiac: regular rate without rubs, murmurs or gallops  Abdomen: soft, nontender without masses, guarding, rebound, organomegaly  Pelvic: external bus vagina: normal with atrophic changes. Second and third degree cystocele, first-degree to second-degree uterine prolapse and rectocele. Cervix: grossly normal with atrophic changes Uterus: normal size, midline and mobile, nontender  Adnexa: without masses or tenderness    Assessment/Plan:  82 y.o. [id number] with history as above. I reviewed with the patient and her 2 daughters the situation and the issues of an asymptomatic patient on tamoxifen with a thicker endometrial echo with positive Doppler flow suggesting a polyp but negative biopsy. I discussed this with them previously as noted in my 11/14/2015 note. At that point we  decided to proceed with hysteroscopy D&C for complete endometrial sampling. She has undergone a cardiac clearance. I can reviewed the proposed surgery with the patient and her daughters to include the expected intraoperative and postoperative courses as well as the recovery period. The use of the hysteroscope, resectoscope and the D&C portion were all discussed. The risks of surgery to include infection, prolonged antibiotics, hemorrhage necessitating transfusion and the risks of transfusion, including transfusion reaction, hepatitis, HIV, mad cow disease and other unknown entities were all discussed understood and accepted. The risk of damage to internal organs during the procedure, either immediately recognized or delay recognized, including vagina, cervix, uterus, possible perforation causing damage to bowel, bladder, ureters, vessels and nerves necessitating major exploratory reparative surgery and future reparative surgeries including bladder repair, ureteral damage repair, bowel resection, ostomy formation was also discussed understood and accepted. The potential for distended media absorption leading to metabolic complications such as fluid overload, coma and seizures was also discussed understood and accepted. The patient's questions were answered to her satisfaction and she is ready to proceed with surgery.    Anastasio Auerbach MD, 3:35 PM 01/15/2016

## 2016-01-15 NOTE — Patient Instructions (Signed)
Followup for surgery as scheduled. 

## 2016-01-20 ENCOUNTER — Encounter: Payer: Self-pay | Admitting: Oncology

## 2016-01-20 MED ORDER — DEXTROSE 5 % IV SOLN
2.0000 g | INTRAVENOUS | Status: AC
  Administered 2016-01-21: 2 g via INTRAVENOUS
  Filled 2016-01-20: qty 2

## 2016-01-21 ENCOUNTER — Encounter (HOSPITAL_COMMUNITY)
Admission: RE | Disposition: A | Discharge status: Home / Self Care | Payer: Self-pay | Source: Ambulatory Visit | Attending: Gynecology

## 2016-01-21 ENCOUNTER — Ambulatory Visit (HOSPITAL_COMMUNITY): Payer: Medicare Other | Admitting: Anesthesiology

## 2016-01-21 ENCOUNTER — Encounter (HOSPITAL_COMMUNITY): Payer: Self-pay

## 2016-01-21 ENCOUNTER — Ambulatory Visit (HOSPITAL_COMMUNITY)
Admission: RE | Admit: 2016-01-21 | Discharge: 2016-01-21 | Disposition: A | Discharge status: Home / Self Care | Payer: Medicare Other | Source: Ambulatory Visit | Attending: Gynecology | Admitting: Gynecology

## 2016-01-21 DIAGNOSIS — K219 Gastro-esophageal reflux disease without esophagitis: Secondary | ICD-10-CM | POA: Diagnosis not present

## 2016-01-21 DIAGNOSIS — R938 Abnormal findings on diagnostic imaging of other specified body structures: Secondary | ICD-10-CM | POA: Diagnosis not present

## 2016-01-21 DIAGNOSIS — Z79810 Long term (current) use of selective estrogen receptor modulators (SERMs): Secondary | ICD-10-CM | POA: Diagnosis not present

## 2016-01-21 DIAGNOSIS — G4730 Sleep apnea, unspecified: Secondary | ICD-10-CM | POA: Insufficient documentation

## 2016-01-21 DIAGNOSIS — N840 Polyp of corpus uteri: Secondary | ICD-10-CM | POA: Insufficient documentation

## 2016-01-21 DIAGNOSIS — M797 Fibromyalgia: Secondary | ICD-10-CM | POA: Diagnosis not present

## 2016-01-21 DIAGNOSIS — I10 Essential (primary) hypertension: Secondary | ICD-10-CM | POA: Diagnosis not present

## 2016-01-21 DIAGNOSIS — N856 Intrauterine synechiae: Secondary | ICD-10-CM

## 2016-01-21 HISTORY — PX: DILATATION & CURETTAGE/HYSTEROSCOPY WITH MYOSURE: SHX6511

## 2016-01-21 SURGERY — DILATATION & CURETTAGE/HYSTEROSCOPY WITH MYOSURE
Anesthesia: General

## 2016-01-21 MED ORDER — PROPOFOL 10 MG/ML IV BOLUS
INTRAVENOUS | Status: AC
  Filled 2016-01-21: qty 20

## 2016-01-21 MED ORDER — DIPHENHYDRAMINE HCL 50 MG/ML IJ SOLN
INTRAMUSCULAR | Status: DC | PRN
  Administered 2016-01-21: 12.5 mg via INTRAVENOUS

## 2016-01-21 MED ORDER — LIDOCAINE HCL (CARDIAC) 20 MG/ML IV SOLN
INTRAVENOUS | Status: DC | PRN
  Administered 2016-01-21: 50 mg via INTRAVENOUS

## 2016-01-21 MED ORDER — KETOROLAC TROMETHAMINE 30 MG/ML IJ SOLN
INTRAMUSCULAR | Status: AC
  Administered 2016-01-21: 15 mg
  Filled 2016-01-21: qty 1

## 2016-01-21 MED ORDER — FENTANYL CITRATE (PF) 100 MCG/2ML IJ SOLN
INTRAMUSCULAR | Status: AC
  Filled 2016-01-21: qty 2

## 2016-01-21 MED ORDER — PROMETHAZINE HCL 25 MG/ML IJ SOLN: 6.2500 mg | INTRAMUSCULAR | Status: DC | PRN

## 2016-01-21 MED ORDER — SODIUM CHLORIDE 0.9 % IR SOLN
Status: DC | PRN
  Administered 2016-01-21: 3000 mL

## 2016-01-21 MED ORDER — HYDROMORPHONE HCL 1 MG/ML IJ SOLN: 0.2500 mg | INTRAMUSCULAR | Status: DC | PRN

## 2016-01-21 MED ORDER — LACTATED RINGERS IV SOLN
INTRAVENOUS | Status: DC
  Administered 2016-01-21 (×2): via INTRAVENOUS

## 2016-01-21 MED ORDER — DEXAMETHASONE SODIUM PHOSPHATE 4 MG/ML IJ SOLN
INTRAMUSCULAR | Status: AC
  Filled 2016-01-21: qty 1

## 2016-01-21 MED ORDER — FENTANYL CITRATE (PF) 100 MCG/2ML IJ SOLN
INTRAMUSCULAR | Status: DC | PRN
  Administered 2016-01-21: 50 ug via INTRAVENOUS

## 2016-01-21 MED ORDER — LIDOCAINE HCL (CARDIAC) 20 MG/ML IV SOLN
INTRAVENOUS | Status: AC
  Filled 2016-01-21: qty 10

## 2016-01-21 MED ORDER — LIDOCAINE HCL 1 % IJ SOLN
INTRAMUSCULAR | Status: DC | PRN
  Administered 2016-01-21: 10 mL

## 2016-01-21 MED ORDER — ONDANSETRON HCL 4 MG/2ML IJ SOLN
INTRAMUSCULAR | Status: DC | PRN
  Administered 2016-01-21: 4 mg via INTRAVENOUS

## 2016-01-21 MED ORDER — KETOROLAC TROMETHAMINE 15 MG/ML IJ SOLN
15.0000 mg | Freq: Once | INTRAMUSCULAR | Status: DC
  Filled 2016-01-21: qty 1

## 2016-01-21 MED ORDER — ONDANSETRON HCL 4 MG/2ML IJ SOLN
INTRAMUSCULAR | Status: AC
  Filled 2016-01-21: qty 2

## 2016-01-21 MED ORDER — LIDOCAINE HCL 1 % IJ SOLN
INTRAMUSCULAR | Status: AC
  Filled 2016-01-21: qty 20

## 2016-01-21 MED ORDER — DEXAMETHASONE SODIUM PHOSPHATE 10 MG/ML IJ SOLN
INTRAMUSCULAR | Status: DC | PRN
  Administered 2016-01-21: 4 mg via INTRAVENOUS

## 2016-01-21 MED ORDER — MEPERIDINE HCL 25 MG/ML IJ SOLN: 6.2500 mg | INTRAMUSCULAR | Status: DC | PRN

## 2016-01-21 MED ORDER — PROPOFOL 10 MG/ML IV BOLUS
INTRAVENOUS | Status: DC | PRN
  Administered 2016-01-21: 130 mg via INTRAVENOUS

## 2016-01-21 SURGICAL SUPPLY — 17 items
CATH ROBINSON RED A/P 16FR (CATHETERS) ×2 IMPLANT
CLOTH BEACON ORANGE TIMEOUT ST (SAFETY) ×2 IMPLANT
CONTAINER PREFILL 10% NBF 60ML (FORM) ×4 IMPLANT
DEVICE MYOSURE LITE (MISCELLANEOUS) IMPLANT
DEVICE MYOSURE REACH (MISCELLANEOUS) ×2 IMPLANT
FILTER ARTHROSCOPY CONVERTOR (FILTER) ×2 IMPLANT
GLOVE BIO SURGEON STRL SZ7.5 (GLOVE) ×4 IMPLANT
GLOVE BIOGEL PI IND STRL 7.0 (GLOVE) ×1 IMPLANT
GLOVE BIOGEL PI INDICATOR 7.0 (GLOVE) ×1
GOWN STRL REUS W/TWL LRG LVL3 (GOWN DISPOSABLE) ×4 IMPLANT
PACK VAGINAL MINOR WOMEN LF (CUSTOM PROCEDURE TRAY) ×2 IMPLANT
PAD OB MATERNITY 4.3X12.25 (PERSONAL CARE ITEMS) ×2 IMPLANT
SEAL ROD LENS SCOPE MYOSURE (ABLATOR) ×2 IMPLANT
TOWEL OR 17X24 6PK STRL BLUE (TOWEL DISPOSABLE) ×4 IMPLANT
TUBING AQUILEX INFLOW (TUBING) ×2 IMPLANT
TUBING AQUILEX OUTFLOW (TUBING) ×2 IMPLANT
WATER STERILE IRR 1000ML POUR (IV SOLUTION) ×2 IMPLANT

## 2016-01-21 NOTE — Transfer of Care (Signed)
Immediate Anesthesia Transfer of Care Note  Patient: Amy Kline  Procedure(s) Performed: Procedure(s) with comments: DILATATION & CURETTAGE/HYSTEROSCOPY WITH MYOSURE (N/A) - request to follow around 11:15am  Requests one hour OR time  Patient Location: PACU  Anesthesia Type:General  Level of Consciousness: awake, alert , oriented and sedated  Airway & Oxygen Therapy: Patient Spontanous Breathing and Patient connected to nasal cannula oxygen  Post-op Assessment: Report given to RN and Post -op Vital signs reviewed and stable  Post vital signs: Reviewed and stable  Last Vitals:  Filed Vitals:   01/21/16 0723  BP: 146/80  Pulse: 78  Temp: 36.3 C  Resp: 16    Last Pain: There were no vitals filed for this visit.    Patients Stated Pain Goal: 3 ([id number])  Complications: No apparent anesthesia complications

## 2016-01-21 NOTE — Op Note (Signed)
Amy Kline 05-Jan-1930 [id number]   Post Operative Note   Date of surgery:  01/21/2016  Pre Op Dx:  Thickened endometrial echo  Post Op Dx:  Endometrial polyp, endometrial synechiae  Procedure:  Hysteroscopy, D&C, Myosure resection of endometrial polyp  Surgeon:  Donalynn Furlong P  Anesthesia:  General  EBL:  Minimal  Distended media discrepancy:  [id number] cc saline  Complications:  None  Specimen:  #1 endometrial curetting #2 endometrial polyp fragments to pathology  Findings: EUA:  External BUS vagina with atrophic changes. Second-degree cystocele noted. Cervix atrophic. Uterus normal size midline mobile. Adnexa without masses   Hysteroscopy: Inadequate noting fundus obliterated with synechiae-like adhesions. Tubal ostia not visualized. Mid cavity and lower uterine segment as well as endocervical canal visualized. Endometrial polyp noted right lateral lower uterine cavity, resected in its entirety to the level the surrounding endometrium.  Procedure:  The patient was taken to the operating room, was placed in the low dorsal lithotomy position, underwent general anesthesia, received a perineal/vaginal preparation with Betadine solution per nursing personnel and the bladder was emptied with in and out Foley catheterization. The timeout was performed by the surgical team. An EUA was performed. The patient was draped in the usual fashion. The cervix was visualized with a speculum, anterior lip grasped with a single-tooth tenaculum and a paracervical block was placed using 10 cc's of 1% lidocaine. The cervix was gently dilated to admit the Myosure hysteroscope and hysteroscopy was performed with findings noted above. Using the Myosure Reach resectoscopic wand the polyp was resected in its entirety to the level the surrounding endometrium. Some of the synechiae was also resected using the resectoscopic wand. Subsequently a gentle sharp curettage was performed. Both specimens were sent  separately to pathology.  Repeat hysteroscopy showed an empty cavity with good distention and no evidence of perforation. The fundal obliteration remained unable to visualize the tubal ostia or the clear fundus. The instruments were removed and adequate hemostasis was visualized at the tenaculum site and external cervical os. The patient was awakened without difficulty and was taken to the recovery room in good condition having tolerated the procedure well.     Anastasio Auerbach MD, 9:18 AM 01/21/2016

## 2016-01-21 NOTE — Anesthesia Postprocedure Evaluation (Signed)
Anesthesia Post Note  Patient: Amy Kline  Procedure(s) Performed: Procedure(s) (LRB): DILATATION & CURETTAGE/HYSTEROSCOPY WITH MYOSURE (N/A)  Patient location during evaluation: PACU Anesthesia Type: General Pain management: pain level controlled Vital Signs Assessment: post-procedure vital signs reviewed and stable Respiratory status: spontaneous breathing Cardiovascular status: stable Anesthetic complications: no     Last Vitals:  Filed Vitals:   01/21/16 1015 01/21/16 1030  BP: 137/75 130/72  Pulse: 75 76  Temp:  36.5 C  Resp: 15 16    Last Pain:  Filed Vitals:   01/21/16 1039  PainSc: 2    Pain Goal: Patients Stated Pain Goal: 3 (01/21/16 0723)               Nolon Nations

## 2016-01-21 NOTE — H&P (Signed)
  The patient was examined.  I reviewed the proposed surgery and consent form with the patient.  The dictated history and physical is current and accurate and all questions were answered. The patient is ready to proceed with surgery and has a realistic understanding and expectation for the outcome.   Anastasio Auerbach MD, 7:59 AM 01/21/2016

## 2016-01-21 NOTE — Anesthesia Preprocedure Evaluation (Signed)
Anesthesia Evaluation  Patient identified by MRN, date of birth, ID band Patient awake  General Assessment Comment:Treat as PONV  Reviewed: Allergy & Precautions, H&P , NPO status , Patient's Chart, lab work & pertinent test results  History of Anesthesia Complications (+) DIFFICULT AIRWAY  Airway Mallampati: II  TM Distance: <3 FB Neck ROM: Limited    Dental no notable dental hx.    Pulmonary shortness of breath, sleep apnea , pneumonia,    Pulmonary exam normal breath sounds clear to auscultation       Cardiovascular hypertension, Pt. on medications + Peripheral Vascular Disease  Normal cardiovascular exam+ Valvular Problems/Murmurs  Rhythm:Regular Rate:Normal     Neuro/Psych  Neuromuscular disease negative neurological ROS  negative psych ROS   GI/Hepatic Neg liver ROS, GERD  Medicated,  Endo/Other  negative endocrine ROS  Renal/GU Renal diseasenegative Renal ROS     Musculoskeletal  (+) Arthritis , Fibromyalgia -  Abdominal   Peds  Hematology negative hematology ROS (+)   Anesthesia Other Findings   Reproductive/Obstetrics negative OB ROS                             Anesthesia Physical  Anesthesia Plan  ASA: II  Anesthesia Plan: General   Post-op Pain Management:    Induction: Intravenous  Airway Management Planned: LMA  Additional Equipment:   Intra-op Plan:   Post-operative Plan: Extubation in OR  Informed Consent: I have reviewed the patients History and Physical, chart, labs and discussed the procedure including the risks, benefits and alternatives for the proposed anesthesia with the patient or authorized representative who has indicated his/her understanding and acceptance.   Dental advisory given  Plan Discussed with: CRNA  Anesthesia Plan Comments:         Anesthesia Quick Evaluation

## 2016-01-21 NOTE — Discharge Instructions (Signed)
° °  Postoperative Instructions Hysteroscopy D & C ° °Dr. Nanna Ertle and the nursing staff have discussed postoperative instructions with you.  If you have any questions please ask them before you leave the hospital, or call Dr Nellie Chevalier’s office at [phone].   ° °We would like to emphasize the following instructions: ° ° °? Call the office to make your follow-up appointment as recommended by Dr Shamarra Warda (usually 1-2 weeks). ° °? You were given a prescription, or one was ordered for you at the pharmacy you designated.  Get that prescription filled and take the medication according to instructions. ° °? You may eat a regular diet, but slowly until you start having bowel movements. ° °? Drink plenty of water daily. ° °? Nothing in the vagina (intercourse, douching, objects of any kind) for two weeks.  When reinitiating intercourse, if it is uncomfortable, stop and make an appointment with Dr Rohail Klees to be evaluated. ° °? No driving for one to two days until the effects of anesthesia has worn off.  No traveling out of town for several days. ° °? You may shower, but no baths for one week.  Walking up and down stairs is ok.  No heavy lifting, prolonged standing, repeated bending or any “working out” until your post op check. ° °? Rest frequently, listen to your body and do not push yourself and overdo it. ° °? Call if: ° °o Your pain medication does not seem strong enough. °o Worsening pain or abdominal bloating °o Persistent nausea or vomiting °o Difficulty with urination or bowel movements. °o Temperature of 101 degrees or higher. °o Heavy vaginal bleeding.  If your period is due, you may use tampons. °o You have any questions or concerns ° ° ° °

## 2016-01-21 NOTE — Anesthesia Procedure Notes (Signed)
Procedure Name: LMA Insertion Date/Time: 01/21/2016 8:28 AM Performed by: Stacie Glaze C Patient Re-evaluated:Patient Re-evaluated prior to inductionOxygen Delivery Method: Circle system utilized and Simple face mask Preoxygenation: Pre-oxygenation with 100% oxygen Intubation Type: IV induction Ventilation: Mask ventilation without difficulty LMA: LMA inserted LMA Size: 4.0 Grade View: Grade II Number of attempts: 1 Dental Injury: Teeth and Oropharynx as per pre-operative assessment

## 2016-01-23 ENCOUNTER — Encounter (HOSPITAL_COMMUNITY): Payer: Self-pay | Admitting: Gynecology

## 2016-02-24 ENCOUNTER — Encounter: Payer: Self-pay | Admitting: Gynecology

## 2016-02-24 ENCOUNTER — Ambulatory Visit (INDEPENDENT_AMBULATORY_CARE_PROVIDER_SITE_OTHER): Payer: Medicare Other | Admitting: Gynecology

## 2016-02-24 VITALS — BP 120/76 | Ht 65.0 in | Wt 165.0 lb

## 2016-02-24 DIAGNOSIS — M8580 Other specified disorders of bone density and structure, unspecified site: Secondary | ICD-10-CM

## 2016-02-24 DIAGNOSIS — Z01419 Encounter for gynecological examination (general) (routine) without abnormal findings: Secondary | ICD-10-CM

## 2016-02-24 DIAGNOSIS — N952 Postmenopausal atrophic vaginitis: Secondary | ICD-10-CM

## 2016-02-24 DIAGNOSIS — C50912 Malignant neoplasm of unspecified site of left female breast: Secondary | ICD-10-CM | POA: Diagnosis not present

## 2016-02-24 DIAGNOSIS — N8189 Other female genital prolapse: Secondary | ICD-10-CM | POA: Diagnosis not present

## 2016-02-24 NOTE — Patient Instructions (Signed)

## 2016-02-24 NOTE — Progress Notes (Signed)
    Amy Kline 08-25-1929 [id number]        82 y.o.  [id number]  for breast and pelvic exam. Several issues noted below.  Past medical history,surgical history, problem list, medications, allergies, family history and social history were all reviewed and documented as reviewed in the EPIC chart.  ROS:  Performed with pertinent positives and negatives included in the history, assessment and plan.   Additional significant findings :  None   Exam: Caryn Bee assistant Vitals:   02/24/16 1105  BP: 120/76  Weight: 165 lb (74.8 kg)  Height: 5\' 5"  (1.651 m)   Body mass index is 27.46 kg/m.  General appearance:  Normal affect, orientation and appearance. Skin: Grossly normal HEENT: Without gross lesions.  No cervical or supraclavicular adenopathy. Thyroid normal.  Lungs:  Clear without wheezing, rales or rhonchi Cardiac: RR, without RMG Abdominal:  Soft, nontender, without masses, guarding, rebound, organomegaly or hernia Breasts:  Examined lying and sitting. Right without masses, retractions, discharge or axillary adenopathy.  Left chest wall status post mastectomy. No masses or adenopathy Pelvic:  Ext/BUS/Vagina With atrophic changes. Second-degree cystocele, first to second-degree uterine prolapse, first to second-degree rectocele  Cervix atrophic  Uterus axial, normal size, shape and contour, midline and mobile nontender   Adnexa without masses or tenderness    Anus and perineum normal   Rectovaginal normal sphincter tone without palpated masses or tenderness.    Assessment/Plan:  82 y.o. G26P3003 female for breast and pelvic exam.   1. Recent hysteroscopy D&C with resection of benign endometrial polyp. Reviewed benign pathology and pictures from the surgery with her and her daughter 2. Postmenopausal/atrophic genital changes. No significant hot flushes, night sweats, vaginal dryness or any vaginal bleeding. Continue monitor report any issues or bleeding. 3. Pelvic  relaxation with cystocele, rectocele, uterine prolapse. Patients asymptomatic without pressure or discomfort. Will monitor for now. Options have been discussed to include surgery and pessary. 4. Osteopenia. DEXA 03/2015 T score -2.1 FRAX 21%/8.7%. Stable from prior DEXA. Had been on tamoxifen which was discontinued this past June due to the endometrial polyp . Options for treatment have been discussed with her and she declined treatment in the past. Will plan repeat DEXA next year at 2 year interval. 5. Mammography 12/2015. Continue with annual mammography when due. Status post left mastectomy in the past. Exam NED. Continue to follow up with her oncologist 6. Colonoscopy 2003. Her other physicians have decided to stop screening based on age. 7. Pap smear 2012. No Pap smear done today. No history of significant abnormal Pap smears. Based on current screening guidelines we both agree to stop screening based on age. 8. Health maintenance. No routine lab work done as patient has this done elsewhere. Follow up in one year, sooner if any issues or bleeding.   Anastasio Auerbach MD, 11:35 AM 02/24/2016

## 2016-03-05 ENCOUNTER — Other Ambulatory Visit: Payer: Self-pay | Admitting: Urology

## 2016-03-05 DIAGNOSIS — N281 Cyst of kidney, acquired: Secondary | ICD-10-CM

## 2016-03-05 DIAGNOSIS — R319 Hematuria, unspecified: Secondary | ICD-10-CM

## 2016-03-24 ENCOUNTER — Ambulatory Visit (HOSPITAL_COMMUNITY)
Admission: RE | Admit: 2016-03-24 | Discharge: 2016-03-24 | Disposition: A | Discharge status: Home / Self Care | Payer: Medicare Other | Source: Ambulatory Visit | Attending: Urology | Admitting: Urology

## 2016-03-24 DIAGNOSIS — Q6100 Congenital renal cyst, unspecified: Secondary | ICD-10-CM | POA: Diagnosis not present

## 2016-03-24 DIAGNOSIS — N2889 Other specified disorders of kidney and ureter: Secondary | ICD-10-CM | POA: Insufficient documentation

## 2016-03-24 DIAGNOSIS — R319 Hematuria, unspecified: Secondary | ICD-10-CM | POA: Insufficient documentation

## 2016-03-24 DIAGNOSIS — I517 Cardiomegaly: Secondary | ICD-10-CM | POA: Diagnosis not present

## 2016-03-24 DIAGNOSIS — N281 Cyst of kidney, acquired: Secondary | ICD-10-CM

## 2016-03-24 LAB — POCT I-STAT CREATININE: Creatinine, Ser: 0.7 mg/dL (ref 0.44–1.00)

## 2016-03-24 MED ORDER — GADOBENATE DIMEGLUMINE 529 MG/ML IV SOLN
15.0000 mL | Freq: Once | INTRAVENOUS | Status: AC | PRN
  Administered 2016-03-24: 15 mL via INTRAVENOUS

## 2016-05-05 DIAGNOSIS — G3184 Mild cognitive impairment, so stated: Secondary | ICD-10-CM | POA: Insufficient documentation

## 2016-05-05 HISTORY — DX: Mild cognitive impairment of uncertain or unknown etiology: G31.84

## 2016-05-07 DIAGNOSIS — E538 Deficiency of other specified B group vitamins: Secondary | ICD-10-CM

## 2016-05-07 HISTORY — DX: Deficiency of other specified B group vitamins: E53.8

## 2016-08-28 ENCOUNTER — Other Ambulatory Visit: Payer: Self-pay | Admitting: Urology

## 2016-08-28 DIAGNOSIS — D3000 Benign neoplasm of unspecified kidney: Secondary | ICD-10-CM

## 2016-08-31 DIAGNOSIS — R490 Dysphonia: Secondary | ICD-10-CM

## 2016-08-31 HISTORY — DX: Dysphonia: R49.0

## 2016-09-10 ENCOUNTER — Ambulatory Visit (HOSPITAL_COMMUNITY)
Admission: RE | Admit: 2016-09-10 | Discharge: 2016-09-10 | Disposition: A | Discharge status: Home / Self Care | Payer: Medicare Other | Source: Ambulatory Visit | Attending: Urology | Admitting: Urology

## 2016-09-10 DIAGNOSIS — I700 Atherosclerosis of aorta: Secondary | ICD-10-CM | POA: Diagnosis not present

## 2016-09-10 DIAGNOSIS — N281 Cyst of kidney, acquired: Secondary | ICD-10-CM | POA: Diagnosis not present

## 2016-09-10 DIAGNOSIS — D3000 Benign neoplasm of unspecified kidney: Secondary | ICD-10-CM | POA: Diagnosis present

## 2016-09-10 DIAGNOSIS — Q453 Other congenital malformations of pancreas and pancreatic duct: Secondary | ICD-10-CM | POA: Insufficient documentation

## 2016-09-10 DIAGNOSIS — K869 Disease of pancreas, unspecified: Secondary | ICD-10-CM | POA: Diagnosis not present

## 2016-09-10 LAB — POCT I-STAT CREATININE: Creatinine, Ser: 0.7 mg/dL (ref 0.44–1.00)

## 2016-09-10 MED ORDER — GADOBENATE DIMEGLUMINE 529 MG/ML IV SOLN
15.0000 mL | Freq: Once | INTRAVENOUS | Status: AC | PRN
  Administered 2016-09-10: 15 mL via INTRAVENOUS

## 2016-12-15 ENCOUNTER — Telehealth: Payer: Self-pay | Admitting: Oncology

## 2016-12-15 NOTE — Telephone Encounter (Signed)
Left a message on VM about appointment change and a call back number

## 2016-12-16 ENCOUNTER — Other Ambulatory Visit: Payer: Medicare Other

## 2016-12-16 ENCOUNTER — Ambulatory Visit: Payer: Medicare Other | Admitting: Oncology

## 2016-12-25 ENCOUNTER — Other Ambulatory Visit: Payer: Self-pay | Admitting: *Deleted

## 2016-12-25 DIAGNOSIS — C50212 Malignant neoplasm of upper-inner quadrant of left female breast: Secondary | ICD-10-CM

## 2016-12-28 ENCOUNTER — Other Ambulatory Visit (HOSPITAL_BASED_OUTPATIENT_CLINIC_OR_DEPARTMENT_OTHER): Payer: Medicare Other

## 2016-12-28 ENCOUNTER — Ambulatory Visit (HOSPITAL_BASED_OUTPATIENT_CLINIC_OR_DEPARTMENT_OTHER): Payer: Medicare Other | Admitting: Oncology

## 2016-12-28 VITALS — BP 150/74 | HR 92 | Temp 97.6°F | Resp 17 | Ht 65.0 in | Wt 168.5 lb

## 2016-12-28 DIAGNOSIS — R42 Dizziness and giddiness: Secondary | ICD-10-CM

## 2016-12-28 DIAGNOSIS — Z853 Personal history of malignant neoplasm of breast: Secondary | ICD-10-CM

## 2016-12-28 DIAGNOSIS — M8580 Other specified disorders of bone density and structure, unspecified site: Secondary | ICD-10-CM

## 2016-12-28 DIAGNOSIS — N393 Stress incontinence (female) (male): Secondary | ICD-10-CM | POA: Diagnosis not present

## 2016-12-28 DIAGNOSIS — C50212 Malignant neoplasm of upper-inner quadrant of left female breast: Secondary | ICD-10-CM

## 2016-12-28 DIAGNOSIS — Z170 Estrogen receptor positive status [ER+]: Secondary | ICD-10-CM

## 2016-12-28 HISTORY — DX: Dizziness and giddiness: R42

## 2016-12-28 LAB — CBC WITH DIFFERENTIAL/PLATELET
BASO%: 1.4 % (ref 0.0–2.0)
BASOS ABS: 0.1 10*3/uL (ref 0.0–0.1)
EOS ABS: 0.3 10*3/uL (ref 0.0–0.5)
EOS%: 3.6 % (ref 0.0–7.0)
HCT: 47.4 % — ABNORMAL HIGH (ref 34.8–46.6)
HEMOGLOBIN: 16.1 g/dL — AB (ref 11.6–15.9)
LYMPH%: 39.5 % (ref 14.0–49.7)
MCH: 32.1 pg (ref 25.1–34.0)
MCHC: 33.9 g/dL (ref 31.5–36.0)
MCV: 94.7 fL (ref 79.5–101.0)
MONO#: 0.8 10*3/uL (ref 0.1–0.9)
MONO%: 9 % (ref 0.0–14.0)
NEUT#: 4.1 10*3/uL (ref 1.5–6.5)
NEUT%: 46.5 % (ref 38.4–76.8)
Platelets: 320 10*3/uL (ref 145–400)
RBC: 5.01 10*6/uL (ref 3.70–5.45)
RDW: 13.4 % (ref 11.2–14.5)
WBC: 8.8 10*3/uL (ref 3.9–10.3)
lymph#: 3.5 10*3/uL — ABNORMAL HIGH (ref 0.9–3.3)

## 2016-12-28 LAB — COMPREHENSIVE METABOLIC PANEL
ALT: 15 U/L (ref 0–55)
ANION GAP: 7 meq/L (ref 3–11)
AST: 19 U/L (ref 5–34)
Albumin: 3.6 g/dL (ref 3.5–5.0)
Alkaline Phosphatase: 161 U/L — ABNORMAL HIGH (ref 40–150)
BUN: 17.6 mg/dL (ref 7.0–26.0)
CHLORIDE: 102 meq/L (ref 98–109)
CO2: 28 meq/L (ref 22–29)
Calcium: 11 mg/dL — ABNORMAL HIGH (ref 8.4–10.4)
Creatinine: 0.8 mg/dL (ref 0.6–1.1)
EGFR: 64 mL/min/{1.73_m2} — AB (ref >90)
Glucose: 92 mg/dl (ref 70–140)
POTASSIUM: 4.2 meq/L (ref 3.5–5.1)
Sodium: 137 mEq/L (ref 136–145)
Total Bilirubin: 0.49 mg/dL (ref 0.20–1.20)
Total Protein: 7.3 g/dL (ref 6.4–8.3)

## 2016-12-28 MED ORDER — CEPHALEXIN 500 MG PO CAPS: 500.0000 mg | ORAL_CAPSULE | Freq: Two times a day (BID) | ORAL | 0 refills | Status: DC
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
## 2016-12-29 ENCOUNTER — Telehealth: Payer: Self-pay

## 2016-12-29 NOTE — Telephone Encounter (Signed)
No note

## 2017-01-07 ENCOUNTER — Encounter: Payer: Self-pay | Admitting: Oncology

## 2017-02-25 ENCOUNTER — Ambulatory Visit (INDEPENDENT_AMBULATORY_CARE_PROVIDER_SITE_OTHER): Payer: Medicare Other | Admitting: Gynecology

## 2017-02-25 ENCOUNTER — Encounter: Payer: Self-pay | Admitting: Gynecology

## 2017-02-25 VITALS — BP 118/76 | Ht 65.0 in | Wt 166.0 lb

## 2017-02-25 DIAGNOSIS — Z01411 Encounter for gynecological examination (general) (routine) with abnormal findings: Secondary | ICD-10-CM

## 2017-02-25 DIAGNOSIS — C50912 Malignant neoplasm of unspecified site of left female breast: Secondary | ICD-10-CM

## 2017-02-25 DIAGNOSIS — M8580 Other specified disorders of bone density and structure, unspecified site: Secondary | ICD-10-CM

## 2017-02-25 DIAGNOSIS — N8189 Other female genital prolapse: Secondary | ICD-10-CM

## 2017-02-25 DIAGNOSIS — N952 Postmenopausal atrophic vaginitis: Secondary | ICD-10-CM

## 2017-02-25 NOTE — Patient Instructions (Signed)
Followup for bone density as scheduled. Followup in one year for annual exam 

## 2017-02-25 NOTE — Progress Notes (Signed)
    Nedda D Mccreadie 04-07-1930 [id number]        84 y.o.  [id number] for breast and pelvic exam.  Past medical history,surgical history, problem list, medications, allergies, family history and social history were all reviewed and documented as reviewed in the EPIC chart.  ROS:  Performed with pertinent positives and negatives included in the history, assessment and plan.   Additional significant findings :  None   Exam: Caryn Bee assistant Vitals:   02/25/17 1537  BP: 118/76  Weight: 166 lb (75.3 kg)  Height: 5\' 5"  (1.651 m)   Body mass index is 27.62 kg/m.  General appearance:  Normal affect, orientation and appearance. Skin: Grossly normal HEENT: Without gross lesions.  No cervical or supraclavicular adenopathy. Thyroid normal.  Lungs:  Clear without wheezing, rales or rhonchi Cardiac: RR, without RMG Abdominal:  Soft, nontender, without masses, guarding, rebound, organomegaly or hernia Breasts:  Examined lying and sitting. Right without masses, retractions, discharge or axillary adenopathy.  Left status post mastectomy with no masses or axillary adenopathy Pelvic:  Ext, BUS, Vagina: With atrophic changes. Second-degree cystocele. First to second-degree uterine prolapse. First to second-degree rectocele.  Cervix: Atrophic  Uterus: Axial, normal size, shape and contour, midline and mobile nontender   Adnexa: Without masses or tenderness    Anus and perineum: Normal   Rectovaginal: Normal sphincter tone without palpated masses or tenderness.    Assessment/Plan:  83 y.o. G59P3003 female for breast and pelvic exam.   1. Postmenopausal/atrophic genital changes. No significant hot flushes, night sweats, vaginal dryness or any vaginal bleeding. Continue to monitor report any issues or bleeding. 2. Pelvic relaxation. Cystocele/rectocele/uterine prolapse as noted above. Patient is asymptomatic. Exam has remained stable over years observation. Continue with annual follow up exams.  Report any symptoms in the interim. 3. Osteopenia. DEXA 2016 T score -2.1. FRAX 21%/8.7%. Stable from prior DEXA. Had been on tamoxifen discontinued last year. Follow up DEXA now a 2 year interval. 4. Mammography 12/2016. Status post left mastectomy for breast cancer in the past. Exam NED. 5. Colonoscopy 2003. Due to age they have stopped doing screening colonoscopies per her history. 6. Pap smear 2012. No Pap smear done today. No history of abnormal Pap smears. Per current screening guidelines we both agree to stop screening based on age. 7. Health maintenance. No routine lab work done as patient reports is done elsewhere. Follow up 1 year, sooner as needed.   Anastasio Auerbach MD, 4:41 PM 02/25/2017

## 2017-11-17 DIAGNOSIS — E559 Vitamin D deficiency, unspecified: Secondary | ICD-10-CM

## 2017-11-17 DIAGNOSIS — D692 Other nonthrombocytopenic purpura: Secondary | ICD-10-CM

## 2017-11-17 HISTORY — DX: Other nonthrombocytopenic purpura: D69.2

## 2017-11-17 HISTORY — DX: Vitamin D deficiency, unspecified: E55.9

## 2017-12-26 NOTE — Progress Notes (Signed)
No show-- patient had previosuly been released to her PCP

## 2017-12-27 ENCOUNTER — Inpatient Hospital Stay: Payer: Medicare Other

## 2017-12-27 ENCOUNTER — Other Ambulatory Visit: Payer: Self-pay | Admitting: Medical Oncology

## 2017-12-27 ENCOUNTER — Inpatient Hospital Stay: Payer: Medicare Other | Attending: Oncology | Admitting: Oncology

## 2017-12-27 DIAGNOSIS — C50212 Malignant neoplasm of upper-inner quadrant of left female breast: Secondary | ICD-10-CM

## 2017-12-27 DIAGNOSIS — Z170 Estrogen receptor positive status [ER+]: Secondary | ICD-10-CM

## 2018-03-10 ENCOUNTER — Encounter: Payer: Medicare Other | Admitting: Gynecology

## 2018-04-01 DIAGNOSIS — R0989 Other specified symptoms and signs involving the circulatory and respiratory systems: Secondary | ICD-10-CM

## 2018-04-01 HISTORY — DX: Other specified symptoms and signs involving the circulatory and respiratory systems: R09.89

## 2018-04-18 DIAGNOSIS — Z9081 Acquired absence of spleen: Secondary | ICD-10-CM | POA: Insufficient documentation

## 2018-04-18 HISTORY — DX: Acquired absence of spleen: Z90.81

## 2018-04-19 ENCOUNTER — Other Ambulatory Visit (HOSPITAL_COMMUNITY): Payer: Self-pay | Admitting: Physical Medicine and Rehabilitation

## 2018-04-19 DIAGNOSIS — R9430 Abnormal result of cardiovascular function study, unspecified: Secondary | ICD-10-CM

## 2018-04-21 ENCOUNTER — Other Ambulatory Visit (HOSPITAL_COMMUNITY): Payer: Self-pay | Admitting: Physical Medicine and Rehabilitation

## 2018-04-21 ENCOUNTER — Ambulatory Visit (HOSPITAL_COMMUNITY)
Admission: RE | Admit: 2018-04-21 | Discharge: 2018-04-21 | Disposition: A | Discharge status: Home / Self Care | Payer: Medicare Other | Source: Ambulatory Visit | Attending: Internal Medicine | Admitting: Internal Medicine

## 2018-04-21 DIAGNOSIS — R9430 Abnormal result of cardiovascular function study, unspecified: Secondary | ICD-10-CM

## 2018-04-21 DIAGNOSIS — M79605 Pain in left leg: Secondary | ICD-10-CM | POA: Diagnosis present

## 2018-04-21 DIAGNOSIS — M79604 Pain in right leg: Secondary | ICD-10-CM | POA: Insufficient documentation

## 2018-04-21 DIAGNOSIS — R29898 Other symptoms and signs involving the musculoskeletal system: Secondary | ICD-10-CM | POA: Insufficient documentation

## 2018-05-09 ENCOUNTER — Encounter: Payer: Medicare Other | Admitting: Gynecology

## 2018-05-23 DIAGNOSIS — N390 Urinary tract infection, site not specified: Secondary | ICD-10-CM

## 2018-05-23 HISTORY — DX: Urinary tract infection, site not specified: N39.0

## 2018-08-02 ENCOUNTER — Encounter: Payer: Self-pay | Admitting: Gynecology

## 2018-08-02 ENCOUNTER — Ambulatory Visit (INDEPENDENT_AMBULATORY_CARE_PROVIDER_SITE_OTHER): Payer: Medicare Other | Admitting: Gynecology

## 2018-08-02 VITALS — BP 120/78 | Ht 65.0 in | Wt 174.0 lb

## 2018-08-02 DIAGNOSIS — Z9289 Personal history of other medical treatment: Secondary | ICD-10-CM | POA: Diagnosis not present

## 2018-08-02 DIAGNOSIS — N8189 Other female genital prolapse: Secondary | ICD-10-CM

## 2018-08-02 DIAGNOSIS — N952 Postmenopausal atrophic vaginitis: Secondary | ICD-10-CM

## 2018-08-02 DIAGNOSIS — Z01419 Encounter for gynecological examination (general) (routine) without abnormal findings: Secondary | ICD-10-CM

## 2018-08-02 DIAGNOSIS — Z853 Personal history of malignant neoplasm of breast: Secondary | ICD-10-CM

## 2018-08-02 DIAGNOSIS — M8580 Other specified disorders of bone density and structure, unspecified site: Secondary | ICD-10-CM

## 2018-08-02 NOTE — Patient Instructions (Signed)
Schedule and follow-up for your bone density  Schedule your screening mammogram at [facility] as we discussed.  Follow-up in 1 year for exam.

## 2018-08-02 NOTE — Progress Notes (Signed)
    Amy Kline Oct 10, 1929 [id number]        85 y.o.  G3P3003 for breast and pelvic exam.  Without gynecologic complaints  Past medical history,surgical history, problem list, medications, allergies, family history and social history were all reviewed and documented as reviewed in the EPIC chart.  ROS:  Performed with pertinent positives and negatives included in the history, assessment and plan.   Additional significant findings : None   Exam: Caryn Bee assistant Vitals:   08/02/18 1551  BP: 120/78  Weight: 174 lb (78.9 kg)  Height: 5\' 5"  (1.651 m)   Body mass index is 28.96 kg/m.  General appearance:  Normal affect, orientation and appearance. Skin: Grossly normal HEENT: Without gross lesions.  No cervical or supraclavicular adenopathy. Thyroid normal.  Lungs:  Clear without wheezing, rales or rhonchi Cardiac: RR, without RMG Abdominal:  Soft, nontender, without masses, guarding, rebound, organomegaly or hernia Breasts:  Examined lying and sitting.  Right without masses, retractions, discharge or axillary adenopathy.  Left status post mastectomy.  No masses or axillary adenopathy Pelvic:  Ext, BUS, Vagina: With atrophic changes.  Second-degree cystocele.  First to second-degree uterine prolapse.  First-degree rectocele  Cervix: With atrophic changes  Uterus: Axial, normal size, shape and contour, midline and mobile nontender   Adnexa: Without masses or tenderness    Anus and perineum: Normal   Rectovaginal: Normal sphincter tone without palpated masses or tenderness.    Assessment/Plan:  85 y.o. G3P3003 female for breast and pelvic exam  1. Postmenopausal.  No significant menopausal symptoms or any vaginal bleeding. 2. Pelvic relaxation.  Stable over years observation.  Asymptomatic to the patient.  Continue with observation and annual exams. 3. Osteopenia.  DEXA 2016 T score -2.1 FRAX 21% / 8.7%.  Relates being managed for parathyroid tumor that apparently led to  some hypercalcemia.  Told not to take extra calcium.  Did not feel she was a candidate for surgery.  Strongly recommended follow-up DEXA now and patient agrees to schedule.  We will continue to follow-up with her primary in reference to management of this. 4. Mammography due now and I reminded patient the need to schedule.  She is going to arrange this at [facility].  History of left-sided breast cancer.  Exam NED. 5. Colonoscopy 2003.  Reports that they have stopped doing screening colonoscopies due to age. 6. Pap smear 2012.  No Pap smear done today.  No history of abnormal Pap smears.  We both agree to stop screening based on age and current screening guidelines. 7. Health maintenance.  No routine lab work done as patient does this elsewhere.  Follow-up 1 year, sooner as needed.   Anastasio Auerbach MD, 4:32 PM 08/02/2018

## 2019-04-20 ENCOUNTER — Encounter: Payer: Self-pay | Admitting: Gynecology

## 2019-08-07 ENCOUNTER — Encounter: Payer: Medicare Other | Admitting: Obstetrics and Gynecology

## 2019-09-21 ENCOUNTER — Encounter: Payer: Medicare Other | Admitting: Obstetrics and Gynecology

## 2019-11-30 ENCOUNTER — Encounter: Payer: Medicare Other | Admitting: Obstetrics and Gynecology

## 2020-02-07 DIAGNOSIS — R42 Dizziness and giddiness: Secondary | ICD-10-CM

## 2020-02-07 HISTORY — DX: Dizziness and giddiness: R42

## 2020-02-08 ENCOUNTER — Encounter: Payer: Medicare Other | Admitting: Obstetrics and Gynecology

## 2020-02-15 DIAGNOSIS — R748 Abnormal levels of other serum enzymes: Secondary | ICD-10-CM

## 2020-02-15 HISTORY — DX: Abnormal levels of other serum enzymes: R74.8

## 2020-02-27 ENCOUNTER — Encounter: Payer: Self-pay | Admitting: *Deleted

## 2020-02-27 ENCOUNTER — Encounter: Payer: Self-pay | Admitting: Cardiology

## 2020-02-29 DIAGNOSIS — R42 Dizziness and giddiness: Secondary | ICD-10-CM | POA: Insufficient documentation

## 2020-02-29 DIAGNOSIS — T148XXA Other injury of unspecified body region, initial encounter: Secondary | ICD-10-CM | POA: Insufficient documentation

## 2020-02-29 DIAGNOSIS — I739 Peripheral vascular disease, unspecified: Secondary | ICD-10-CM | POA: Insufficient documentation

## 2020-02-29 DIAGNOSIS — R32 Unspecified urinary incontinence: Secondary | ICD-10-CM | POA: Insufficient documentation

## 2020-02-29 DIAGNOSIS — M549 Dorsalgia, unspecified: Secondary | ICD-10-CM | POA: Insufficient documentation

## 2020-02-29 DIAGNOSIS — R011 Cardiac murmur, unspecified: Secondary | ICD-10-CM | POA: Insufficient documentation

## 2020-02-29 DIAGNOSIS — R490 Dysphonia: Secondary | ICD-10-CM | POA: Insufficient documentation

## 2020-02-29 DIAGNOSIS — R002 Palpitations: Secondary | ICD-10-CM | POA: Insufficient documentation

## 2020-02-29 DIAGNOSIS — E214 Other specified disorders of parathyroid gland: Secondary | ICD-10-CM | POA: Insufficient documentation

## 2020-02-29 DIAGNOSIS — N281 Cyst of kidney, acquired: Secondary | ICD-10-CM | POA: Insufficient documentation

## 2020-02-29 DIAGNOSIS — C50919 Malignant neoplasm of unspecified site of unspecified female breast: Secondary | ICD-10-CM | POA: Insufficient documentation

## 2020-02-29 DIAGNOSIS — I10 Essential (primary) hypertension: Secondary | ICD-10-CM | POA: Insufficient documentation
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
## 2020-03-04 ENCOUNTER — Ambulatory Visit (INDEPENDENT_AMBULATORY_CARE_PROVIDER_SITE_OTHER): Payer: Medicare Other

## 2020-03-04 ENCOUNTER — Ambulatory Visit (INDEPENDENT_AMBULATORY_CARE_PROVIDER_SITE_OTHER): Payer: Medicare Other | Admitting: Cardiology

## 2020-03-04 ENCOUNTER — Encounter: Payer: Self-pay | Admitting: Cardiology

## 2020-03-04 ENCOUNTER — Other Ambulatory Visit: Payer: Self-pay

## 2020-03-04 VITALS — BP 156/89 | HR 79 | Ht 65.0 in | Wt 182.0 lb

## 2020-03-04 DIAGNOSIS — I10 Essential (primary) hypertension: Secondary | ICD-10-CM | POA: Diagnosis not present

## 2020-03-04 DIAGNOSIS — I427 Cardiomyopathy due to drug and external agent: Secondary | ICD-10-CM

## 2020-03-04 DIAGNOSIS — R0602 Shortness of breath: Secondary | ICD-10-CM

## 2020-03-04 DIAGNOSIS — R55 Syncope and collapse: Secondary | ICD-10-CM

## 2020-03-04 DIAGNOSIS — C50212 Malignant neoplasm of upper-inner quadrant of left female breast: Secondary | ICD-10-CM

## 2020-03-04 MED ORDER — FUROSEMIDE 20 MG PO TABS: 20.0000 mg | ORAL_TABLET | Freq: Every day | ORAL | 3 refills | Status: DC

## 2020-03-04 NOTE — Addendum Note (Signed)
Addended by: Resa Miner I on: 03/04/2020 03:43 PM   Modules accepted: Orders

## 2020-03-04 NOTE — Patient Instructions (Signed)
Medication Instructions:  Your physician has recommended you make the following change in your medication:  DECREASE: Carvedilol take 1/2 tablet by mouth daily.  START: Lasix 20 mg take one tablet by mouth daily.  *If you need a refill on your cardiac medications before your next appointment, please call your pharmacy*   Lab Work: Your physician recommends that you return for lab work in: [place], D-dimer If you have labs (blood work) drawn today and your tests are completely normal, you will receive your results only by: Marland Kitchen MyChart Message (if you have MyChart) OR . A paper copy in the mail If you have any lab test that is abnormal or we need to change your treatment, we will call you to review the results.   Testing/Procedures: Your physician has requested that you have an echocardiogram. Echocardiography is a painless test that uses sound waves to create images of your heart. It provides your doctor with information about the size and shape of your heart and how well your heart's chambers and valves are working. This procedure takes approximately one hour. There are no restrictions for this procedure.  A zio monitor was ordered today. It will remain on for 7 days. You will then return monitor and event diary in provided box. It takes 1-2 weeks for report to be downloaded and returned to [place]. We will call you with the results. If monitor falls off or has orange flashing light, please call Zio for further instructions.     We have given you the order sheet to have a chest x-ray completed at [facility]. Please get this done as soon as possible   Follow-Up: At [facility], you and your health needs are our priority.  As part of our continuing mission to provide you with exceptional heart care, we have created designated Provider Care Teams.  These Care Teams include your primary Cardiologist (physician) and Advanced Practice Providers (APPs -  Physician Assistants and Nurse  Practitioners) who all work together to provide you with the care you need, when you need it.  We recommend signing up for the patient portal called "MyChart".  Sign up information is provided on this After Visit Summary.  MyChart is used to connect with patients for Virtual Visits (Telemedicine).  Patients are able to view lab/test results, encounter notes, upcoming appointments, etc.  Non-urgent messages can be sent to your provider as well.   To learn more about what you can do with MyChart, go to [email or web address].    Your next appointment:   2-3 week(s)  The format for your next appointment:   In Person  Provider:   Shirlee More, MD   Other Instructions

## 2020-03-05 ENCOUNTER — Telehealth: Payer: Self-pay

## 2020-03-05 ENCOUNTER — Encounter: Payer: Self-pay | Admitting: Cardiology

## 2020-03-05 LAB — BASIC METABOLIC PANEL
BUN/Creatinine Ratio: 30 — ABNORMAL HIGH (ref 12–28)
BUN: 21 mg/dL (ref 8–27)
CO2: 23 mmol/L (ref 20–29)
Calcium: 10.3 mg/dL (ref 8.7–10.3)
Chloride: 99 mmol/L (ref 96–106)
Creatinine, Ser: 0.69 mg/dL (ref 0.57–1.00)
GFR calc Af Amer: 90 mL/min/{1.73_m2} (ref >59)
GFR calc non Af Amer: 78 mL/min/{1.73_m2} (ref >59)
Glucose: 101 mg/dL — ABNORMAL HIGH (ref 65–99)
Potassium: 4.4 mmol/L (ref 3.5–5.2)
Sodium: 136 mmol/L (ref 134–144)

## 2020-03-05 LAB — D-DIMER, QUANTITATIVE: D-DIMER: 1.03 mg/L FEU — ABNORMAL HIGH (ref 0.00–0.49)

## 2020-03-05 LAB — PRO B NATRIURETIC PEPTIDE: NT-Pro BNP: 109 pg/mL (ref 0–738)

## 2020-03-05 NOTE — Telephone Encounter (Signed)
Spoke with patient regarding results and recommendation.  Patient verbalizes understanding and is agreeable to plan of care. Advised patient to call back with any issues or concerns.  

## 2020-03-05 NOTE — Telephone Encounter (Signed)
-----   Message from Richardo Priest, MD sent at 03/05/2020  8:53 AM EDT ----- Her D-dimer is elevated  She needs CTA performed regarding pulmonary embolism she has normal renal function and is not allergic to contrast dye [facility] today if possible

## 2020-03-05 NOTE — Telephone Encounter (Signed)
-----   Message from Richardo Priest, MD sent at 03/05/2020  1:43 PM EDT ----- Her chest x-ray at [facility] is abnormal with bilateral infiltrates may represent interstitial pneumonia or pulmonary fibrosis.  I will send a copy this note to Dr. Bea Graff and I think she would benefit from referral to pulmonary.

## 2020-03-08 DIAGNOSIS — J8410 Pulmonary fibrosis, unspecified: Secondary | ICD-10-CM | POA: Insufficient documentation

## 2020-03-12 DIAGNOSIS — I2510 Atherosclerotic heart disease of native coronary artery without angina pectoris: Secondary | ICD-10-CM | POA: Insufficient documentation

## 2020-03-22 ENCOUNTER — Ambulatory Visit (INDEPENDENT_AMBULATORY_CARE_PROVIDER_SITE_OTHER): Payer: Medicare Other

## 2020-03-22 ENCOUNTER — Other Ambulatory Visit: Payer: Self-pay

## 2020-03-22 DIAGNOSIS — I10 Essential (primary) hypertension: Secondary | ICD-10-CM

## 2020-03-22 DIAGNOSIS — R55 Syncope and collapse: Secondary | ICD-10-CM

## 2020-03-22 DIAGNOSIS — C50212 Malignant neoplasm of upper-inner quadrant of left female breast: Secondary | ICD-10-CM | POA: Diagnosis not present

## 2020-03-22 DIAGNOSIS — I427 Cardiomyopathy due to drug and external agent: Secondary | ICD-10-CM | POA: Diagnosis not present

## 2020-03-22 NOTE — Progress Notes (Signed)
Carotid duplex exam performed  Jimmy Juanda Luba RDCS, RVT 

## 2020-03-25 ENCOUNTER — Telehealth: Payer: Self-pay

## 2020-03-25 LAB — ECHOCARDIOGRAM COMPLETE
Area-P 1/2: 4.15 cm2
S' Lateral: 2.35 cm

## 2020-03-25 NOTE — Telephone Encounter (Signed)
Spoke with patient regarding results and recommendation.  Patient verbalizes understanding and is agreeable to plan of care. Advised patient to call back with any issues or concerns.  

## 2020-03-25 NOTE — Telephone Encounter (Signed)
-----   Message from Richardo Priest, MD sent at 03/25/2020  1:09 PM EDT ----- In general this is a report that is reassuring, no evidence of heart muscle injury from chemotherapy changes of hypertension but does not look like congestive heart failure.
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
## 2020-03-26 ENCOUNTER — Other Ambulatory Visit: Payer: Self-pay

## 2020-03-26 ENCOUNTER — Encounter: Payer: Self-pay | Admitting: Cardiology

## 2020-03-26 ENCOUNTER — Ambulatory Visit (INDEPENDENT_AMBULATORY_CARE_PROVIDER_SITE_OTHER): Payer: Medicare Other | Admitting: Cardiology

## 2020-03-26 VITALS — BP 158/98 | HR 84 | Ht 65.0 in | Wt 178.6 lb

## 2020-03-26 DIAGNOSIS — R0602 Shortness of breath: Secondary | ICD-10-CM | POA: Diagnosis not present

## 2020-03-26 DIAGNOSIS — R9389 Abnormal findings on diagnostic imaging of other specified body structures: Secondary | ICD-10-CM | POA: Diagnosis not present

## 2020-03-26 DIAGNOSIS — I119 Hypertensive heart disease without heart failure: Secondary | ICD-10-CM

## 2020-03-26 MED ORDER — TELMISARTAN-HCTZ 40-12.5 MG PO TABS: 0.5000 | ORAL_TABLET | Freq: Every day | ORAL | 3 refills | Status: DC

## 2020-03-26 MED ORDER — CARVEDILOL 12.5 MG PO TABS: 6.2500 mg | ORAL_TABLET | Freq: Two times a day (BID) | ORAL | 3 refills | Status: DC

## 2020-03-26 NOTE — Patient Instructions (Signed)
Medication Instructions:  Your physician has recommended you make the following change in your medication:  START: Telmisartan/HTCZ 40/12.5 mg take 0.5 tablet by mouth daily.  STOP: Losartan *If you need a refill on your cardiac medications before your next appointment, please call your pharmacy*   Lab Work: None If you have labs (blood work) drawn today and your tests are completely normal, you will receive your results only by: Marland Kitchen MyChart Message (if you have MyChart) OR . A paper copy in the mail If you have any lab test that is abnormal or we need to change your treatment, we will call you to review the results.   Testing/Procedures: None   Follow-Up: At [facility], you and your health needs are our priority.  As part of our continuing mission to provide you with exceptional heart care, we have created designated Provider Care Teams.  These Care Teams include your primary Cardiologist (physician) and Advanced Practice Providers (APPs -  Physician Assistants and Nurse Practitioners) who all work together to provide you with the care you need, when you need it.  We recommend signing up for the patient portal called "MyChart".  Sign up information is provided on this After Visit Summary.  MyChart is used to connect with patients for Virtual Visits (Telemedicine).  Patients are able to view lab/test results, encounter notes, upcoming appointments, etc.  Non-urgent messages can be sent to your provider as well.   To learn more about what you can do with MyChart, go to [email or web address].    Your next appointment:   As needed The format for your next appointment:   In Person  Provider:   Shirlee More, MD   Other Instructions

## 2020-04-11 ENCOUNTER — Encounter: Payer: Self-pay | Admitting: Obstetrics and Gynecology

## 2020-04-11 ENCOUNTER — Ambulatory Visit (INDEPENDENT_AMBULATORY_CARE_PROVIDER_SITE_OTHER): Payer: Medicare Other | Admitting: Obstetrics and Gynecology

## 2020-04-11 ENCOUNTER — Other Ambulatory Visit: Payer: Self-pay

## 2020-04-11 VITALS — BP 124/78 | Ht 64.0 in | Wt 177.0 lb

## 2020-04-11 DIAGNOSIS — Z23 Encounter for immunization: Secondary | ICD-10-CM | POA: Diagnosis not present

## 2020-04-11 DIAGNOSIS — M8588 Other specified disorders of bone density and structure, other site: Secondary | ICD-10-CM

## 2020-04-11 DIAGNOSIS — M8580 Other specified disorders of bone density and structure, unspecified site: Secondary | ICD-10-CM

## 2020-04-11 DIAGNOSIS — Z01419 Encounter for gynecological examination (general) (routine) without abnormal findings: Secondary | ICD-10-CM

## 2020-04-11 NOTE — Progress Notes (Signed)
   Janyla D Dike 07-13-1930 [id number]  SUBJECTIVE:  86 y.o. G3P3003 female here for a breast and pelvic exam. She has no gynecologic concerns.  Current Outpatient Medications  Medication Sig Dispense Refill  . aspirin 81 MG tablet Take 81 mg by mouth daily.    . carvedilol (COREG) 12.5 MG tablet Take 0.5 tablets (6.25 mg total) by mouth 2 (two) times daily with a meal. 90 tablet 3  . Milnacipran HCl (SAVELLA) 25 MG TABS Take 25 mg by mouth daily.    . Multiple Vitamins-Minerals (PRESERVISION AREDS 2 PO) Take 1 tablet by mouth in the morning and at bedtime.    Marland Kitchen omeprazole (PRILOSEC) 20 MG capsule Take 20 mg by mouth at bedtime.     . Polyethylene Glycol 3350 (MIRALAX PO) Take by mouth daily.    Marland Kitchen telmisartan-hydrochlorothiazide (MICARDIS HCT) 40-12.5 MG tablet Take 0.5 tablets by mouth daily. 45 tablet 3   No current facility-administered medications for this visit.   Allergies: Neosporin [neomycin-polymyxin-gramicidin], Sulfa antibiotics, Bacitracin-polymyxin b, Cefdinir, Other, and Statins  No LMP recorded. Patient is postmenopausal.  Past medical history,surgical history, problem list, medications, allergies, family history and social history were all reviewed and documented as reviewed in the EPIC chart.  GYN ROS: no abnormal bleeding, pelvic pain or discharge, no breast pain or new or enlarging lumps on self exam.  No dysuria, urinary frequency, pain with urination, cloudy/malodorous urine.   OBJECTIVE:  BP 124/78   Ht 5\' 4"  (1.626 m)   Wt 177 lb (80.3 kg)   BMI 30.38 kg/m  The patient appears well, alert, oriented, in no distress.  BREAST EXAM: right breast appears normal, no suspicious masses, no skin or nipple changes or axillary nodes, prior left mastectomy with no suspicious masses, no skin changes or axillary nodes  PELVIC EXAM: VULVA: normal appearing vulva with atrophic change, no masses, tenderness or lesions, VAGINA: normal appearing vagina with atrophic change,  normal color and discharge, no lesions, CERVIX: normal appearing atrophic cervix without discharge or lesions, UTERUS: uterus is normal size, shape, consistency and nontender, ADNEXA: normal adnexa in size, nontender and no masses, RECTAL: external hemorrhoids non-thrombosed   Chaperone: Caryn Bee present during the examination  ASSESSMENT:  86 y.o. G3P3003 here for a breast and pelvic exam  PLAN:   1. Postmenopausal.  No significant hot flashes or night sweats.  No vaginal bleeding. 2. Pap smear 11/2010.  Comfortable with not continuing screening based on age criteria and the current guidelines. 3. History of the left breast cancer.  Mammogram 12/2016.  Had CT of chest to work-up/rule out pneumonia/blood clot, no evidence of breast abnormality noted.  She may choose to just follow through with a mammogram next year.  Normal breast exam today/NED.   4. Colonoscopy 2003.  Will defer colon cancer screening recommendations to her primary care provider. 5. DEXA 2016 with T score -2.1.  FRAX 21% / 8.7%.  Apparently managed for a parathyroid tumor and had some hypercalcemia and told not to take extra calcium.  She should repeat a DEXA scan and we will have her schedule. 6. Health maintenance.  No labs today as she normally has these completed elsewhere.  Return annually or sooner, prn.  Joseph Pierini MD 04/11/20

## 2020-04-24 ENCOUNTER — Telehealth: Payer: Self-pay

## 2020-04-24 NOTE — Telephone Encounter (Signed)
Spoke to patient just now and let her know that per Dr. Bettina Gavia her heart monitor came back normal. She verbalizes understanding and thanks me for the call back.

## 2020-04-29 DIAGNOSIS — K869 Disease of pancreas, unspecified: Secondary | ICD-10-CM | POA: Insufficient documentation

## 2020-05-14 ENCOUNTER — Other Ambulatory Visit: Payer: Self-pay

## 2020-05-14 ENCOUNTER — Ambulatory Visit (INDEPENDENT_AMBULATORY_CARE_PROVIDER_SITE_OTHER): Payer: Medicare Other | Admitting: Neurology

## 2020-05-14 ENCOUNTER — Encounter: Payer: Self-pay | Admitting: Neurology

## 2020-05-14 VITALS — BP 127/73 | HR 88 | Ht 65.5 in | Wt 176.5 lb

## 2020-05-14 DIAGNOSIS — R269 Unspecified abnormalities of gait and mobility: Secondary | ICD-10-CM | POA: Insufficient documentation

## 2020-05-14 DIAGNOSIS — G603 Idiopathic progressive neuropathy: Secondary | ICD-10-CM

## 2020-05-14 DIAGNOSIS — R55 Syncope and collapse: Secondary | ICD-10-CM | POA: Diagnosis not present

## 2020-05-14 DIAGNOSIS — Z8673 Personal history of transient ischemic attack (TIA), and cerebral infarction without residual deficits: Secondary | ICD-10-CM

## 2020-05-14 DIAGNOSIS — G629 Polyneuropathy, unspecified: Secondary | ICD-10-CM | POA: Insufficient documentation
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
## 2020-05-20 ENCOUNTER — Ambulatory Visit (INDEPENDENT_AMBULATORY_CARE_PROVIDER_SITE_OTHER): Payer: Medicare Other | Admitting: Neurology

## 2020-05-20 DIAGNOSIS — R55 Syncope and collapse: Secondary | ICD-10-CM | POA: Diagnosis not present

## 2020-05-20 NOTE — Progress Notes (Signed)
   [facility]  EEG (ELECTROENCEPHALOGRAM) REPORT   STUDY DATE: 05/20/2020 PATIENT NAME: Amy Kline DOB: [birth year] MRN: [id number]  ORDERING CLINICIAN: Pablo Stauffer A. Felecia Shelling, MD. PhD  TECHNOLOGIST: Nonda Lou, RPSGT TECHNIQUE: Electroencephalogram was recorded utilizing standard 10-20 system of lead placement and reformatted into average and bipolar montages.  RECORDING TIME: 24 minutes 17 seconds  CLINICAL INFORMATION: 86 year old woman with episodes of loss of consciousness  FINDINGS: A digital EEG was performed while the patient was awake and drowsy. While awake and most alert there was an 8 - 9  hz posterior dominant rhythm. Voltages and frequencies were symmetric.  There were no focal, lateralizing, epileptiform activity or seizures seen.  Photic stimulation had a normal driving response. Hyperventilation and recovery did not change the underlying rhythms. EKG channel shows normal sinus rhythm.  The patient became drowsy on one occasion but did not fall asleep.  IMPRESSION: This is a normal age-appropriate EEG while the patient was awake and drowsy.   INTERPRETING PHYSICIAN:   Tianni Escamilla A. Felecia Shelling, MD, PhD, [facility] Certified in Neurology, Clinical Neurophysiology, Sleep Medicine, Pain Medicine and Neuroimaging  [facility] [address] [place], [place] [address] [phone]

## 2020-05-21 ENCOUNTER — Telehealth: Payer: Self-pay | Admitting: *Deleted

## 2020-05-21 ENCOUNTER — Ambulatory Visit: Payer: Medicare Other | Admitting: Neurology

## 2020-05-21 NOTE — Telephone Encounter (Signed)
-----   Message from Britt Bottom, MD sent at 05/20/2020  5:46 PM EST ----- Regarding: EEG Please let the patient or her family know that the EEG was normal for her age.  There is no evidence of seizure activity.

## 2020-05-21 NOTE — Telephone Encounter (Signed)
Called pt. Advised EEG normal for age per MD. Pt verbalized understanding. She is scheduled for carotid doppler 05/24/20. Advised we will call with those results once they come back.

## 2020-05-24 ENCOUNTER — Ambulatory Visit
Admission: RE | Admit: 2020-05-24 | Discharge: 2020-05-24 | Disposition: A | Discharge status: Home / Self Care | Payer: Medicare Other | Source: Ambulatory Visit | Attending: Neurology | Admitting: Neurology

## 2020-05-24 DIAGNOSIS — R55 Syncope and collapse: Secondary | ICD-10-CM

## 2020-05-27 ENCOUNTER — Other Ambulatory Visit: Payer: Self-pay | Admitting: Neurology

## 2020-05-27 ENCOUNTER — Telehealth: Payer: Self-pay | Admitting: *Deleted

## 2020-05-27 DIAGNOSIS — G629 Polyneuropathy, unspecified: Secondary | ICD-10-CM

## 2020-05-27 NOTE — Telephone Encounter (Signed)
Spoke with Dr. Felecia Shelling. He is going to order EMG/NCS to be done to evaluate polyneuropathy further since EEG and Carotid doppler normal. I called and spoke with pt. She verbalized understanding. She is aware that someone will reach out to get her EMG/NCS scheduled. She would like to be called on her cell (she can hear better on this) at [phone].

## 2020-05-27 NOTE — Telephone Encounter (Signed)
-----   Message from Britt Bottom, MD sent at 05/25/2020 11:05 AM EST ----- Please let her know that the carotid Dopplers were normal for age.

## 2020-06-18 ENCOUNTER — Encounter (INDEPENDENT_AMBULATORY_CARE_PROVIDER_SITE_OTHER): Payer: Medicare Other | Admitting: Neurology

## 2020-06-18 ENCOUNTER — Telehealth: Payer: Self-pay | Admitting: Neurology

## 2020-06-18 ENCOUNTER — Encounter: Payer: Self-pay | Admitting: Neurology

## 2020-06-18 ENCOUNTER — Ambulatory Visit (INDEPENDENT_AMBULATORY_CARE_PROVIDER_SITE_OTHER): Payer: Medicare Other | Admitting: Neurology

## 2020-06-18 DIAGNOSIS — M5416 Radiculopathy, lumbar region: Secondary | ICD-10-CM | POA: Diagnosis not present

## 2020-06-18 DIAGNOSIS — G629 Polyneuropathy, unspecified: Secondary | ICD-10-CM

## 2020-06-18 DIAGNOSIS — R269 Unspecified abnormalities of gait and mobility: Secondary | ICD-10-CM | POA: Diagnosis not present

## 2020-06-18 DIAGNOSIS — Z0289 Encounter for other administrative examinations: Secondary | ICD-10-CM

## 2020-06-18 MED ORDER — PYRIDOSTIGMINE BROMIDE 60 MG PO TABS: 30.0000 mg | ORAL_TABLET | Freq: Three times a day (TID) | ORAL | 5 refills | Status: DC

## 2020-06-18 NOTE — Telephone Encounter (Signed)
Medicare/aarp order sent to [facility]. No auth they will reach out to the patient to schedule.  
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
## 2020-06-19 NOTE — Telephone Encounter (Signed)
patient is scheduled at [facility] for 06/21/20 arrive at 11:45 am. patient is aware of time & day

## 2020-06-24 NOTE — Telephone Encounter (Signed)
Took call from phone staff and spoke with Narda Rutherford at [facility]. She called to give report on MRI lumbar completed on 06/21/20: "Moderate to severe left L1/L2, bilateral L3-4 and left L5/S1 neuroforaminalnarrowing. Nonspecific hyperintense foci involving T12, L1 vertebral bodies, left S1 joint, new since prior exam. Consider further evaluation with post contrast lumbar spine MRI and MRI pelvis w/wo." Advised I will send message to MD for further review.

## 2020-06-25 ENCOUNTER — Telehealth: Payer: Self-pay | Admitting: Neurology

## 2020-06-25 DIAGNOSIS — M5136 Other intervertebral disc degeneration, lumbar region: Secondary | ICD-10-CM

## 2020-06-25 DIAGNOSIS — M51369 Other intervertebral disc degeneration, lumbar region without mention of lumbar back pain or lower extremity pain: Secondary | ICD-10-CM

## 2020-06-25 DIAGNOSIS — C50919 Malignant neoplasm of unspecified site of unspecified female breast: Secondary | ICD-10-CM

## 2020-06-25 DIAGNOSIS — R937 Abnormal findings on diagnostic imaging of other parts of musculoskeletal system: Secondary | ICD-10-CM

## 2020-06-25 NOTE — Telephone Encounter (Signed)
I called to let her know that the MRI of the lumbar spine showed some degenerative changes in the lower lumbar spine.  This could explain some back and leg pain.  Additionally,  there were some changes in the bone marrow of the lower lumbar spine and the sacrum.  I discussed with her that this could be due to inflammation from the arthritic changes but we need to check an MRI of the pelvis with and without contrast and MRI lumbar with contrast (with only) to make sure that there is not a more serious issue.  She understands and would like to proceed with the studies.  Since starting the pyridostigmine, she notes that her lightheadedness upon standing is doing much better.  She is tolerating well.

## 2020-06-26 ENCOUNTER — Other Ambulatory Visit: Payer: Self-pay | Admitting: *Deleted

## 2020-06-26 ENCOUNTER — Telehealth: Payer: Self-pay | Admitting: Neurology

## 2020-06-26 DIAGNOSIS — G603 Idiopathic progressive neuropathy: Secondary | ICD-10-CM

## 2020-06-26 DIAGNOSIS — M5416 Radiculopathy, lumbar region: Secondary | ICD-10-CM

## 2020-06-26 DIAGNOSIS — R55 Syncope and collapse: Secondary | ICD-10-CM

## 2020-06-26 DIAGNOSIS — G629 Polyneuropathy, unspecified: Secondary | ICD-10-CM

## 2020-06-26 DIAGNOSIS — M5136 Other intervertebral disc degeneration, lumbar region: Secondary | ICD-10-CM

## 2020-06-26 DIAGNOSIS — Z01812 Encounter for preprocedural laboratory examination: Secondary | ICD-10-CM

## 2020-06-26 DIAGNOSIS — C50919 Malignant neoplasm of unspecified site of unspecified female breast: Secondary | ICD-10-CM

## 2020-06-26 DIAGNOSIS — R937 Abnormal findings on diagnostic imaging of other parts of musculoskeletal system: Secondary | ICD-10-CM

## 2020-06-26 DIAGNOSIS — R269 Unspecified abnormalities of gait and mobility: Secondary | ICD-10-CM

## 2020-06-26 DIAGNOSIS — Z8673 Personal history of transient ischemic attack (TIA), and cerebral infarction without residual deficits: Secondary | ICD-10-CM

## 2020-06-26 NOTE — Addendum Note (Signed)
Addended by: Wyvonnia Lora on: 06/26/2020 12:01 PM   Modules accepted: Orders

## 2020-06-26 NOTE — Telephone Encounter (Signed)
Medicare/aarp no auth patient is scheduled at [facility] for 07/01/20 to arrive at 9:30 am for labs then 10:15 AM for the MRI pt is aware of time & day.

## 2020-06-26 NOTE — Telephone Encounter (Signed)
Dr. Felecia Shelling approved MRI lumbar W/WO. Printed order, waiting on MD signature

## 2020-06-26 NOTE — Telephone Encounter (Signed)
[facility] just called me and informed me they do not just do MR Lumbar spine with contrast. The order would have to be switch to MR Lumbar spine w/wo contrast.

## 2020-06-26 NOTE — Telephone Encounter (Signed)
Patient wanted Dr. Felecia Shelling aware that since he mention something about an infection that she wanted him to be aware that she just finished a rough of Cipro for her UTI around the time she had the MRI Lumbar spine wo contrast.

## 2020-06-26 NOTE — Telephone Encounter (Signed)
Also since she is getting contrast they need a recent within 45 days BUN & Creatine blood work.. when you get a chance can you put a lab order in.

## 2020-06-26 NOTE — Telephone Encounter (Signed)
Amy Kline had to switch the scheduling now the patient is aware to be there at 8:45 am to get labs and 9:30 AM to do the MRI's.

## 2020-06-27 NOTE — Telephone Encounter (Signed)
Thank you. Faxed orders to [facility].

## 2020-06-28 ENCOUNTER — Encounter: Payer: Self-pay | Admitting: Neurology

## 2020-07-03 ENCOUNTER — Telehealth: Payer: Self-pay | Admitting: Cardiology

## 2020-07-03 ENCOUNTER — Telehealth: Payer: Self-pay | Admitting: Neurology

## 2020-07-03 DIAGNOSIS — C50919 Malignant neoplasm of unspecified site of unspecified female breast: Secondary | ICD-10-CM

## 2020-07-03 DIAGNOSIS — C7951 Secondary malignant neoplasm of bone: Secondary | ICD-10-CM | POA: Insufficient documentation

## 2020-07-03 MED ORDER — TELMISARTAN-HCTZ 40-12.5 MG PO TABS: 0.5000 | ORAL_TABLET | Freq: Every day | ORAL | 2 refills | Status: DC

## 2020-07-03 NOTE — Telephone Encounter (Signed)
Refill sent for Telemisartan-HCTZ to [facility] per patient's request.

## 2020-07-03 NOTE — Telephone Encounter (Signed)
Pt c/o medication issue:  1. Name of Medication: telmisartan-hydrochlorothiazide (MICARDIS HCT) 40-12.5 MG tablet  2. How are you currently taking this medication (dosage and times per day)? As directed  3. Are you having a reaction (difficulty breathing--STAT)? No   4. What is your medication issue?   Patient states she thought she ordered a refill of her Telmisartan, but she actually ordered the incorrect medication by mistake. she states the bottles looked exactly the same, so she assumed it was the correct medication, but when the read the name she noticed it was for pyridostigmine (MESTINON) 60 MG tablet. Patient states she is completely out of Telmisartan. Are we able to send a 90 day supply to her local pharmacy, [facility], [address] AT [place]. Please advise.

## 2020-07-03 NOTE — Telephone Encounter (Signed)
Pt's daughter, Aleda Grana ( on [place]) calling check on MRI results.

## 2020-07-03 NOTE — Telephone Encounter (Signed)
I spoke to Amy Kline to discuss the recent MRIs of the pelvis and lumbar spine with contrast.  It is very concerning for metastatic disease.  Of note, she has a history of breast cancer and last saw Dr. Jana Hakim in 2018  We will try to get her back in with oncology.

## 2020-07-10 ENCOUNTER — Telehealth: Payer: Self-pay | Admitting: Oncology

## 2020-07-10 NOTE — Telephone Encounter (Signed)
Received a new pt referral from Dr. Epimenio Foot for metastatic disease. Pt has been scheduled to see Dr. Darnelle Catalan on 1/6 at 4:15pm w/labs at 345pm. I cld and lft the appt date and time on the pt's vm. I also sent a msg to the referring office to notify the pt as well.

## 2020-07-15 ENCOUNTER — Telehealth: Payer: Self-pay | Admitting: *Deleted

## 2020-07-15 ENCOUNTER — Telehealth: Payer: Self-pay | Admitting: Neurology

## 2020-07-15 ENCOUNTER — Other Ambulatory Visit: Payer: Self-pay | Admitting: Oncology

## 2020-07-15 NOTE — Telephone Encounter (Signed)
Called [facility] called her back about referral questions . [phone] . Left her a message to call me back. Thanks Annabelle Harman

## 2020-07-15 NOTE — Telephone Encounter (Signed)
This RN spoke with pt per her call wanting to verify appointment is still scheduled on 1/6 due to " I got a my chart notice that my appointment was canceled and then later it showed back up "  This RN verified her appointment.  She also noted that appointment shows " breast cancer "- " but Dr Epimenio Foot thinks I have another type of cancer is why he wanted to send me back to Dr Darnelle Catalan"  This RN reviewed chart and could not locate recent records regarding request visit.  This RN informed pt above would be reviewed with Dr Darnelle Catalan for possible need to obtain blood prior to appointment - that may take a week to get results.  This RN will also inquire with new patient coordinator about recent records pertaining to appointment request.  Plan is this RN will call pt tomorrow with updated and verified plan.

## 2020-07-15 NOTE — Telephone Encounter (Signed)
Spoke to [place] and she needs MRI from  [facility] faxed to [phone] . Patient has apt this coming Thursday .

## 2020-07-16 NOTE — Telephone Encounter (Signed)
Patient is aware of her apt and I have Fax Request for MRI reports to be fax [phone].

## 2020-07-16 NOTE — Telephone Encounter (Signed)
Sent Fax to [facility] [phone]

## 2020-07-17 ENCOUNTER — Telehealth: Payer: Self-pay

## 2020-07-17 ENCOUNTER — Other Ambulatory Visit: Payer: Self-pay

## 2020-07-17 DIAGNOSIS — C50212 Malignant neoplasm of upper-inner quadrant of left female breast: Secondary | ICD-10-CM

## 2020-07-17 NOTE — Telephone Encounter (Signed)
Patient called to inquire about upcoming appointment for 1/6   RN reviewed and was able to obtain records in Epic from previous MRI's.   RN reviewed MRI's with MD - MD recommendations are as follows:   Cancel 07/18/2020 Apt  Set up bone marrow biopsy with Wilber Bihari, NP   Set up follow up with MD to review results on 07/31/2020 @ 430.    RN notified patient, patient verbalized agreement.  RN educated patient on bone marrow bx and answered all questions.   RN notified flo cytometry and high priority message sent to schedule bone marrow for 07/22/2020 @ 730am, with lab to follow.
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
## 2020-07-18 ENCOUNTER — Ambulatory Visit: Payer: Medicare Other | Admitting: Oncology

## 2020-07-18 ENCOUNTER — Other Ambulatory Visit: Payer: Medicare Other

## 2020-07-18 ENCOUNTER — Other Ambulatory Visit: Payer: Self-pay | Admitting: Oncology

## 2020-07-18 DIAGNOSIS — C50212 Malignant neoplasm of upper-inner quadrant of left female breast: Secondary | ICD-10-CM

## 2020-07-22 ENCOUNTER — Encounter: Payer: Self-pay | Admitting: Adult Health

## 2020-07-22 ENCOUNTER — Inpatient Hospital Stay: Payer: Medicare Other

## 2020-07-22 ENCOUNTER — Inpatient Hospital Stay: Payer: Medicare Other | Attending: Oncology | Admitting: Adult Health

## 2020-07-22 ENCOUNTER — Other Ambulatory Visit: Payer: Self-pay

## 2020-07-22 VITALS — BP 149/92 | HR 72 | Temp 98.2°F | Resp 16

## 2020-07-22 DIAGNOSIS — C7951 Secondary malignant neoplasm of bone: Secondary | ICD-10-CM | POA: Diagnosis present

## 2020-07-22 DIAGNOSIS — C50212 Malignant neoplasm of upper-inner quadrant of left female breast: Secondary | ICD-10-CM

## 2020-07-22 DIAGNOSIS — R978 Other abnormal tumor markers: Secondary | ICD-10-CM | POA: Insufficient documentation

## 2020-07-22 DIAGNOSIS — R970 Elevated carcinoembryonic antigen [CEA]: Secondary | ICD-10-CM | POA: Insufficient documentation

## 2020-07-22 DIAGNOSIS — C50911 Malignant neoplasm of unspecified site of right female breast: Secondary | ICD-10-CM | POA: Diagnosis present

## 2020-07-22 LAB — CBC WITH DIFFERENTIAL/PLATELET
Abs Immature Granulocytes: 0.04 10*3/uL (ref 0.00–0.07)
Basophils Absolute: 0.1 10*3/uL (ref 0.0–0.1)
Basophils Relative: 1 %
Eosinophils Absolute: 0.5 10*3/uL (ref 0.0–0.5)
Eosinophils Relative: 5 %
HCT: 45.6 % (ref 36.0–46.0)
Hemoglobin: 15.5 g/dL — ABNORMAL HIGH (ref 12.0–15.0)
Immature Granulocytes: 0 %
Lymphocytes Relative: 29 %
Lymphs Abs: 2.8 10*3/uL (ref 0.7–4.0)
MCH: 31.8 pg (ref 26.0–34.0)
MCHC: 34 g/dL (ref 30.0–36.0)
MCV: 93.6 fL (ref 80.0–100.0)
Monocytes Absolute: 1.3 10*3/uL — ABNORMAL HIGH (ref 0.1–1.0)
Monocytes Relative: 13 %
Neutro Abs: 5 10*3/uL (ref 1.7–7.7)
Neutrophils Relative %: 52 %
Platelets: 366 10*3/uL (ref 150–400)
RBC: 4.87 MIL/uL (ref 3.87–5.11)
RDW: 14 % (ref 11.5–15.5)
WBC: 9.8 10*3/uL (ref 4.0–10.5)
nRBC: 0 % (ref 0.0–0.2)

## 2020-07-22 LAB — CEA (IN HOUSE-CHCC): CEA (CHCC-In House): 6.65 ng/mL — ABNORMAL HIGH (ref 0.00–5.00)

## 2020-07-22 LAB — COMPREHENSIVE METABOLIC PANEL
ALT: 14 U/L (ref 0–44)
AST: 24 U/L (ref 15–41)
Albumin: 3.5 g/dL (ref 3.5–5.0)
Alkaline Phosphatase: 190 U/L — ABNORMAL HIGH (ref 38–126)
Anion gap: 10 (ref 5–15)
BUN: 18 mg/dL (ref 8–23)
CO2: 26 mmol/L (ref 22–32)
Calcium: 10.5 mg/dL — ABNORMAL HIGH (ref 8.9–10.3)
Chloride: 101 mmol/L (ref 98–111)
Creatinine, Ser: 0.78 mg/dL (ref 0.44–1.00)
GFR, Estimated: >60 mL/min (ref >60)
Glucose, Bld: 109 mg/dL — ABNORMAL HIGH (ref 70–99)
Potassium: 3.4 mmol/L — ABNORMAL LOW (ref 3.5–5.1)
Sodium: 137 mmol/L (ref 135–145)
Total Bilirubin: 0.5 mg/dL (ref 0.3–1.2)
Total Protein: 7.4 g/dL (ref 6.5–8.1)

## 2020-07-22 NOTE — Patient Instructions (Signed)
Bone Marrow Aspiration and Bone Marrow Biopsy, Adult, Care After  This sheet gives you information about how to care for yourself after your procedure. Your health care provider may also give you more specific instructions. If you have problems or questions, contact your health care provider.  What can I expect after the procedure?  After the procedure, it is common to have:  · Mild pain and tenderness.  · Swelling.  · Bruising.  Follow these instructions at home:  Puncture site care    · Follow instructions from your health care provider about how to take care of the puncture site. Make sure you:  ? Wash your hands with soap and water before and after you change your bandage (dressing). If soap and water are not available, use hand sanitizer.  ? Change your dressing as told by your health care provider.  · Check your puncture site every day for signs of infection. Check for:  ? More redness, swelling, or pain.  ? Fluid or blood.  ? Warmth.  ? Pus or a bad smell.  Activity  · Return to your normal activities as told by your health care provider. Ask your health care provider what activities are safe for you.  · Do not lift anything that is heavier than 10 lb (4.5 kg), or the limit that you are told, until your health care provider says that it is safe.  · Do not drive for 24 hours if you were given a sedative during your procedure.  General instructions    · Take over-the-counter and prescription medicines only as told by your health care provider.  · Do not take baths, swim, or use a hot tub until your health care provider approves. Ask your health care provider if you may take showers. You may only be allowed to take sponge baths.  · If directed, put ice on the affected area. To do this:  ? Put ice in a plastic bag.  ? Place a towel between your skin and the bag.  ? Leave the ice on for 20 minutes, 2-3 times a day.  · Keep all follow-up visits as told by your health care provider. This is important.  Contact a  health care provider if:  · Your pain is not controlled with medicine.  · You have a fever.  · You have more redness, swelling, or pain around the puncture site.  · You have fluid or blood coming from the puncture site.  · Your puncture site feels warm to the touch.  · You have pus or a bad smell coming from the puncture site.  Summary  · After the procedure, it is common to have mild pain, tenderness, swelling, and bruising.  · Follow instructions from your health care provider about how to take care of the puncture site and what activities are safe for you.  · Take over-the-counter and prescription medicines only as told by your health care provider.  · Contact a health care provider if you have any signs of infection, such as fluid or blood coming from the puncture site.  This information is not intended to replace advice given to you by your health care provider. Make sure you discuss any questions you have with your health care provider.  Document Revised: 11/15/2018 Document Reviewed: 11/15/2018  Elsevier Patient Education © 2021 [employer]

## 2020-07-22 NOTE — Progress Notes (Signed)
Pt stable at discharge, no complaints, VSS.   BMBX site checked, dressing clean, dry, and intact.  No complaints.  Pt transported to lobby via wheelchair.

## 2020-07-22 NOTE — Progress Notes (Signed)
INDICATION: metastatic cancer, r/o marrow invovlement  Brief examination was performed. ENT: adequate airway clearance Heart: regular rate and rhythm.No Murmurs Lungs: clear to auscultation, no wheezes, normal respiratory effort  Bone Marrow Biopsy and Aspiration Procedure Note   Informed consent was obtained and potential risks including bleeding, infection and pain were reviewed with the patient.  The patient's name, date of birth, identification, consent and allergies were verified prior to the start of procedure and time out was performed.  The right posterior iliac crest was chosen as the site of biopsy.  The skin was prepped with ChloraPrep.   15 cc of 2% lidocaine was used to provide local anaesthesia.   10 cc of bone marrow aspirate was obtained followed by 1cm biopsy.  Pressure was applied to the biopsy site and bandage was placed over the biopsy site. Patient was made to lie on the back for 30 mins prior to discharge.  The procedure was tolerated well. COMPLICATIONS: None BLOOD LOSS: none The patient was discharged home in stable condition with a 1 week follow up to review results.  Patient was provided with post bone marrow biopsy instructions and instructed to call if there was any bleeding or worsening pain.  Specimens sent for flow cytometry, cytogenetics and additional studies.  Signed Scot Dock, NP

## 2020-07-23 ENCOUNTER — Telehealth: Payer: Self-pay | Admitting: Adult Health

## 2020-07-23 LAB — CANCER ANTIGEN 27.29: CA 27.29: 87 U/mL — ABNORMAL HIGH (ref 0.0–38.6)

## 2020-07-23 LAB — CA 125: Cancer Antigen (CA) 125: 23.7 U/mL (ref 0.0–38.1)

## 2020-07-23 NOTE — Telephone Encounter (Signed)
No 1/10 los. No changes made to pt's schedule.  

## 2020-07-26 ENCOUNTER — Other Ambulatory Visit: Payer: Self-pay | Admitting: Oncology

## 2020-07-26 DIAGNOSIS — C50212 Malignant neoplasm of upper-inner quadrant of left female breast: Secondary | ICD-10-CM

## 2020-07-26 DIAGNOSIS — C7951 Secondary malignant neoplasm of bone: Secondary | ICD-10-CM

## 2020-07-26 NOTE — Progress Notes (Signed)
I called Amy Kline with the results of her bone marrow biopsy which are completely normal.  She does have a slightly elevated CEA and a slightly elevated CA 27-29.  We can stop the work-up at this point.  Neck step is scans and we will obtain those in [facility] at her request.  If that does not give [place] an answer we will need a PET scan  I am moving back her appointment with me from January 19 to sometime late February

## 2020-07-29 ENCOUNTER — Encounter (HOSPITAL_COMMUNITY): Payer: Self-pay | Admitting: Oncology

## 2020-07-30 ENCOUNTER — Telehealth: Payer: Self-pay | Admitting: Oncology

## 2020-07-30 LAB — SURGICAL PATHOLOGY

## 2020-07-30 NOTE — Telephone Encounter (Signed)
Received a sch msg to r/s Amy Kline's 1/19 appt to late Feb. Pt has been rescheduled to see Dr. Jana Hakim on 2/21 at 4pm. I cld and lft the new appt date and time on her vm.

## 2020-07-31 ENCOUNTER — Inpatient Hospital Stay: Payer: Medicare Other | Admitting: Oncology

## 2020-08-20 ENCOUNTER — Other Ambulatory Visit: Payer: Self-pay | Admitting: Oncology

## 2020-08-20 ENCOUNTER — Other Ambulatory Visit: Payer: Self-pay | Admitting: *Deleted

## 2020-08-20 DIAGNOSIS — R599 Enlarged lymph nodes, unspecified: Secondary | ICD-10-CM

## 2020-08-20 DIAGNOSIS — R899 Unspecified abnormal finding in specimens from other organs, systems and tissues: Secondary | ICD-10-CM

## 2020-08-20 DIAGNOSIS — R978 Other abnormal tumor markers: Secondary | ICD-10-CM

## 2020-08-20 DIAGNOSIS — D249 Benign neoplasm of unspecified breast: Secondary | ICD-10-CM

## 2020-08-20 DIAGNOSIS — C50912 Malignant neoplasm of unspecified site of left female breast: Secondary | ICD-10-CM

## 2020-08-28 ENCOUNTER — Other Ambulatory Visit: Payer: Self-pay | Admitting: Oncology

## 2020-08-28 DIAGNOSIS — C50912 Malignant neoplasm of unspecified site of left female breast: Secondary | ICD-10-CM

## 2020-08-28 DIAGNOSIS — R978 Other abnormal tumor markers: Secondary | ICD-10-CM

## 2020-08-28 DIAGNOSIS — R599 Enlarged lymph nodes, unspecified: Secondary | ICD-10-CM

## 2020-08-28 DIAGNOSIS — D249 Benign neoplasm of unspecified breast: Secondary | ICD-10-CM

## 2020-08-28 DIAGNOSIS — R899 Unspecified abnormal finding in specimens from other organs, systems and tissues: Secondary | ICD-10-CM

## 2020-08-30 ENCOUNTER — Other Ambulatory Visit: Payer: Self-pay

## 2020-08-30 ENCOUNTER — Ambulatory Visit
Admission: RE | Admit: 2020-08-30 | Discharge: 2020-08-30 | Disposition: A | Discharge status: Home / Self Care | Payer: Medicare Other | Source: Ambulatory Visit | Attending: Oncology | Admitting: Oncology

## 2020-08-30 ENCOUNTER — Other Ambulatory Visit: Payer: Self-pay | Admitting: Oncology

## 2020-08-30 DIAGNOSIS — R599 Enlarged lymph nodes, unspecified: Secondary | ICD-10-CM

## 2020-08-30 DIAGNOSIS — R899 Unspecified abnormal finding in specimens from other organs, systems and tissues: Secondary | ICD-10-CM

## 2020-08-30 DIAGNOSIS — C50912 Malignant neoplasm of unspecified site of left female breast: Secondary | ICD-10-CM

## 2020-08-30 DIAGNOSIS — N6310 Unspecified lump in the right breast, unspecified quadrant: Secondary | ICD-10-CM

## 2020-08-30 DIAGNOSIS — D249 Benign neoplasm of unspecified breast: Secondary | ICD-10-CM

## 2020-08-30 DIAGNOSIS — R978 Other abnormal tumor markers: Secondary | ICD-10-CM

## 2020-08-30 DIAGNOSIS — R2232 Localized swelling, mass and lump, left upper limb: Secondary | ICD-10-CM

## 2020-09-01 NOTE — Progress Notes (Signed)
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 course and we have some evidence of response I have recommended that she transfer to one of my partners in [place] and she is open to that possibility  Total encounter time 65 minutes.Sarajane Jews C. Areta Terwilliger MD Oncology and Hematology [facility] [place], [place] [address] Tel. [phone]  Joylene Igo [phone]   I, Wilburn Mylar, am acting as scribe for Dr. Sarajane Jews C. Audria Takeshita.  Joylene Grapes Tayven Renteria  MD, have reviewed the above documentation for accuracy and completeness, and I agree with the above.    *Total Encounter Time as defined by the Centers for Medicare and Medicaid Services includes, in addition to the face-to-face time of a patient visit (documented in the note above) non-face-to-face time: obtaining and reviewing outside history, ordering and reviewing medications, tests or procedures, care coordination (communications with other health care professionals or caregivers) and documentation in the medical record.

## 2020-09-02 ENCOUNTER — Inpatient Hospital Stay: Payer: Medicare Other | Attending: Oncology | Admitting: Oncology

## 2020-09-02 ENCOUNTER — Other Ambulatory Visit: Payer: Self-pay | Admitting: Oncology

## 2020-09-02 ENCOUNTER — Other Ambulatory Visit: Payer: Self-pay

## 2020-09-02 VITALS — BP 153/77 | HR 87 | Temp 98.1°F | Resp 18 | Ht 65.5 in | Wt 175.1 lb

## 2020-09-02 DIAGNOSIS — M1990 Unspecified osteoarthritis, unspecified site: Secondary | ICD-10-CM | POA: Insufficient documentation

## 2020-09-02 DIAGNOSIS — Z7982 Long term (current) use of aspirin: Secondary | ICD-10-CM | POA: Insufficient documentation

## 2020-09-02 DIAGNOSIS — K219 Gastro-esophageal reflux disease without esophagitis: Secondary | ICD-10-CM | POA: Diagnosis not present

## 2020-09-02 DIAGNOSIS — Z170 Estrogen receptor positive status [ER+]: Secondary | ICD-10-CM | POA: Insufficient documentation

## 2020-09-02 DIAGNOSIS — M5136 Other intervertebral disc degeneration, lumbar region: Secondary | ICD-10-CM | POA: Diagnosis not present

## 2020-09-02 DIAGNOSIS — E559 Vitamin D deficiency, unspecified: Secondary | ICD-10-CM | POA: Insufficient documentation

## 2020-09-02 DIAGNOSIS — I429 Cardiomyopathy, unspecified: Secondary | ICD-10-CM | POA: Diagnosis not present

## 2020-09-02 DIAGNOSIS — E213 Hyperparathyroidism, unspecified: Secondary | ICD-10-CM | POA: Insufficient documentation

## 2020-09-02 DIAGNOSIS — I739 Peripheral vascular disease, unspecified: Secondary | ICD-10-CM | POA: Insufficient documentation

## 2020-09-02 DIAGNOSIS — M8580 Other specified disorders of bone density and structure, unspecified site: Secondary | ICD-10-CM | POA: Diagnosis not present

## 2020-09-02 DIAGNOSIS — Z853 Personal history of malignant neoplasm of breast: Secondary | ICD-10-CM | POA: Insufficient documentation

## 2020-09-02 DIAGNOSIS — J309 Allergic rhinitis, unspecified: Secondary | ICD-10-CM | POA: Diagnosis not present

## 2020-09-02 DIAGNOSIS — Z9012 Acquired absence of left breast and nipple: Secondary | ICD-10-CM | POA: Insufficient documentation

## 2020-09-02 DIAGNOSIS — M419 Scoliosis, unspecified: Secondary | ICD-10-CM | POA: Insufficient documentation

## 2020-09-02 DIAGNOSIS — C7951 Secondary malignant neoplasm of bone: Secondary | ICD-10-CM | POA: Insufficient documentation

## 2020-09-02 DIAGNOSIS — I10 Essential (primary) hypertension: Secondary | ICD-10-CM | POA: Diagnosis not present

## 2020-09-02 DIAGNOSIS — E782 Mixed hyperlipidemia: Secondary | ICD-10-CM | POA: Insufficient documentation

## 2020-09-02 DIAGNOSIS — E538 Deficiency of other specified B group vitamins: Secondary | ICD-10-CM | POA: Insufficient documentation

## 2020-09-02 DIAGNOSIS — Z79899 Other long term (current) drug therapy: Secondary | ICD-10-CM | POA: Insufficient documentation

## 2020-09-02 DIAGNOSIS — I499 Cardiac arrhythmia, unspecified: Secondary | ICD-10-CM | POA: Insufficient documentation

## 2020-09-02 DIAGNOSIS — C773 Secondary and unspecified malignant neoplasm of axilla and upper limb lymph nodes: Secondary | ICD-10-CM | POA: Insufficient documentation

## 2020-09-02 DIAGNOSIS — N6001 Solitary cyst of right breast: Secondary | ICD-10-CM | POA: Insufficient documentation

## 2020-09-02 DIAGNOSIS — N281 Cyst of kidney, acquired: Secondary | ICD-10-CM | POA: Insufficient documentation

## 2020-09-02 DIAGNOSIS — M797 Fibromyalgia: Secondary | ICD-10-CM | POA: Insufficient documentation

## 2020-09-02 DIAGNOSIS — C50212 Malignant neoplasm of upper-inner quadrant of left female breast: Secondary | ICD-10-CM

## 2020-09-02 NOTE — Progress Notes (Signed)
Radiology records requested from [facility]. To be faxed to [place] today.

## 2020-09-04 ENCOUNTER — Telehealth: Payer: Self-pay | Admitting: Cardiology

## 2020-09-04 ENCOUNTER — Telehealth: Payer: Self-pay | Admitting: Oncology

## 2020-09-04 NOTE — Telephone Encounter (Signed)
Noriah is calling stating Dr. Jana Hakim is requesting her most recent Echo results performed by our office. She states he advised her to tell our office he does not want the disk just the physical results. Please advise.

## 2020-09-04 NOTE — Telephone Encounter (Signed)
Scheduled appts per 2/21 los. Pt confirmed appt date/time.

## 2020-09-04 NOTE — Telephone Encounter (Signed)
Records faxed per request.

## 2020-09-06 ENCOUNTER — Other Ambulatory Visit: Payer: Self-pay | Admitting: Oncology

## 2020-09-06 DIAGNOSIS — Z9081 Acquired absence of spleen: Secondary | ICD-10-CM

## 2020-09-09 ENCOUNTER — Other Ambulatory Visit: Payer: Self-pay | Admitting: Oncology

## 2020-09-09 DIAGNOSIS — Z7189 Other specified counseling: Secondary | ICD-10-CM

## 2020-09-09 MED ORDER — ANASTROZOLE 1 MG PO TABS: 1.0000 mg | ORAL_TABLET | Freq: Every day | ORAL | 4 refills | Status: DC

## 2020-09-09 NOTE — Progress Notes (Signed)
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  We are going to treat her with subcu Herceptin and anastrzole  She will see me on 09/12/2020 and we will discuss what to do regarding her getting treated in [place]. 

## 2020-09-11 NOTE — Progress Notes (Signed)
x

## 2020-09-12 ENCOUNTER — Inpatient Hospital Stay (HOSPITAL_BASED_OUTPATIENT_CLINIC_OR_DEPARTMENT_OTHER): Payer: Medicare Other | Admitting: Oncology

## 2020-09-12 ENCOUNTER — Inpatient Hospital Stay: Payer: Medicare Other

## 2020-09-12 ENCOUNTER — Other Ambulatory Visit: Payer: Self-pay

## 2020-09-12 ENCOUNTER — Inpatient Hospital Stay: Payer: Medicare Other | Attending: Oncology

## 2020-09-12 VITALS — BP 160/84 | HR 84 | Temp 97.9°F | Resp 17 | Ht 65.5 in | Wt 175.1 lb

## 2020-09-12 DIAGNOSIS — C50212 Malignant neoplasm of upper-inner quadrant of left female breast: Secondary | ICD-10-CM

## 2020-09-12 DIAGNOSIS — R531 Weakness: Secondary | ICD-10-CM | POA: Diagnosis not present

## 2020-09-12 DIAGNOSIS — C773 Secondary and unspecified malignant neoplasm of axilla and upper limb lymph nodes: Secondary | ICD-10-CM | POA: Insufficient documentation

## 2020-09-12 DIAGNOSIS — Z79899 Other long term (current) drug therapy: Secondary | ICD-10-CM | POA: Insufficient documentation

## 2020-09-12 DIAGNOSIS — Z9081 Acquired absence of spleen: Secondary | ICD-10-CM | POA: Diagnosis not present

## 2020-09-12 DIAGNOSIS — I10 Essential (primary) hypertension: Secondary | ICD-10-CM | POA: Insufficient documentation

## 2020-09-12 DIAGNOSIS — E213 Hyperparathyroidism, unspecified: Secondary | ICD-10-CM | POA: Insufficient documentation

## 2020-09-12 DIAGNOSIS — Z8673 Personal history of transient ischemic attack (TIA), and cerebral infarction without residual deficits: Secondary | ICD-10-CM | POA: Insufficient documentation

## 2020-09-12 DIAGNOSIS — Z5111 Encounter for antineoplastic chemotherapy: Secondary | ICD-10-CM | POA: Diagnosis present

## 2020-09-12 DIAGNOSIS — C7951 Secondary malignant neoplasm of bone: Secondary | ICD-10-CM | POA: Insufficient documentation

## 2020-09-12 DIAGNOSIS — R011 Cardiac murmur, unspecified: Secondary | ICD-10-CM | POA: Diagnosis not present

## 2020-09-12 DIAGNOSIS — I341 Nonrheumatic mitral (valve) prolapse: Secondary | ICD-10-CM | POA: Insufficient documentation

## 2020-09-12 DIAGNOSIS — M5136 Other intervertebral disc degeneration, lumbar region: Secondary | ICD-10-CM | POA: Diagnosis not present

## 2020-09-12 DIAGNOSIS — I429 Cardiomyopathy, unspecified: Secondary | ICD-10-CM | POA: Diagnosis not present

## 2020-09-12 DIAGNOSIS — E538 Deficiency of other specified B group vitamins: Secondary | ICD-10-CM | POA: Insufficient documentation

## 2020-09-12 DIAGNOSIS — Z853 Personal history of malignant neoplasm of breast: Secondary | ICD-10-CM | POA: Insufficient documentation

## 2020-09-12 DIAGNOSIS — I739 Peripheral vascular disease, unspecified: Secondary | ICD-10-CM | POA: Insufficient documentation

## 2020-09-12 DIAGNOSIS — K219 Gastro-esophageal reflux disease without esophagitis: Secondary | ICD-10-CM | POA: Diagnosis not present

## 2020-09-12 DIAGNOSIS — Z79810 Long term (current) use of selective estrogen receptor modulators (SERMs): Secondary | ICD-10-CM | POA: Insufficient documentation

## 2020-09-12 DIAGNOSIS — Z170 Estrogen receptor positive status [ER+]: Secondary | ICD-10-CM | POA: Diagnosis not present

## 2020-09-12 DIAGNOSIS — Z9012 Acquired absence of left breast and nipple: Secondary | ICD-10-CM | POA: Insufficient documentation

## 2020-09-12 DIAGNOSIS — M8580 Other specified disorders of bone density and structure, unspecified site: Secondary | ICD-10-CM | POA: Insufficient documentation

## 2020-09-12 DIAGNOSIS — E559 Vitamin D deficiency, unspecified: Secondary | ICD-10-CM | POA: Insufficient documentation

## 2020-09-12 LAB — CBC WITH DIFFERENTIAL/PLATELET
Abs Immature Granulocytes: 0.03 10*3/uL (ref 0.00–0.07)
Basophils Absolute: 0.1 10*3/uL (ref 0.0–0.1)
Basophils Relative: 1 %
Eosinophils Absolute: 0.4 10*3/uL (ref 0.0–0.5)
Eosinophils Relative: 4 %
HCT: 43.6 % (ref 36.0–46.0)
Hemoglobin: 14.7 g/dL (ref 12.0–15.0)
Immature Granulocytes: 0 %
Lymphocytes Relative: 24 %
Lymphs Abs: 2.4 10*3/uL (ref 0.7–4.0)
MCH: 31.3 pg (ref 26.0–34.0)
MCHC: 33.7 g/dL (ref 30.0–36.0)
MCV: 92.8 fL (ref 80.0–100.0)
Monocytes Absolute: 1 10*3/uL (ref 0.1–1.0)
Monocytes Relative: 10 %
Neutro Abs: 6 10*3/uL (ref 1.7–7.7)
Neutrophils Relative %: 61 %
Platelets: 393 10*3/uL (ref 150–400)
RBC: 4.7 MIL/uL (ref 3.87–5.11)
RDW: 14.1 % (ref 11.5–15.5)
WBC: 9.9 10*3/uL (ref 4.0–10.5)
nRBC: 0 % (ref 0.0–0.2)

## 2020-09-12 LAB — COMPREHENSIVE METABOLIC PANEL
ALT: 27 U/L (ref 0–44)
AST: 39 U/L (ref 15–41)
Albumin: 3.6 g/dL (ref 3.5–5.0)
Alkaline Phosphatase: 244 U/L — ABNORMAL HIGH (ref 38–126)
Anion gap: 9 (ref 5–15)
BUN: 18 mg/dL (ref 8–23)
CO2: 27 mmol/L (ref 22–32)
Calcium: 10.5 mg/dL — ABNORMAL HIGH (ref 8.9–10.3)
Chloride: 100 mmol/L (ref 98–111)
Creatinine, Ser: 0.83 mg/dL (ref 0.44–1.00)
GFR, Estimated: >60 mL/min (ref >60)
Glucose, Bld: 114 mg/dL — ABNORMAL HIGH (ref 70–99)
Potassium: 3.6 mmol/L (ref 3.5–5.1)
Sodium: 136 mmol/L (ref 135–145)
Total Bilirubin: 0.5 mg/dL (ref 0.3–1.2)
Total Protein: 7.7 g/dL (ref 6.5–8.1)

## 2020-09-12 LAB — CEA (IN HOUSE-CHCC): CEA (CHCC-In House): 10.12 ng/mL — ABNORMAL HIGH (ref 0.00–5.00)

## 2020-09-12 MED ORDER — DENOSUMAB 120 MG/1.7ML ~~LOC~~ SOLN
120.0000 mg | Freq: Once | SUBCUTANEOUS | Status: AC
  Administered 2020-09-12: 120 mg via SUBCUTANEOUS

## 2020-09-12 MED ORDER — DENOSUMAB 120 MG/1.7ML ~~LOC~~ SOLN
SUBCUTANEOUS | Status: AC
  Filled 2020-09-12: qty 1.7

## 2020-09-12 NOTE — Patient Instructions (Signed)
Denosumab injection What is this medicine? DENOSUMAB (den oh sue mab) slows bone breakdown. Prolia is used to treat osteoporosis in women after menopause and in men, and in people who are taking corticosteroids for 6 months or more. Xgeva is used to treat a high calcium level due to cancer and to prevent bone fractures and other bone problems caused by multiple myeloma or cancer bone metastases. Xgeva is also used to treat giant cell tumor of the bone. This medicine may be used for other purposes; ask your health care provider or pharmacist if you have questions. COMMON BRAND NAME(S): Prolia, XGEVA What should I tell my health care provider before I take this medicine? They need to know if you have any of these conditions:  dental disease  having surgery or tooth extraction  infection  kidney disease  low levels of calcium or Vitamin D in the blood  malnutrition  on hemodialysis  skin conditions or sensitivity  thyroid or parathyroid disease  an unusual reaction to denosumab, other medicines, foods, dyes, or preservatives  pregnant or trying to get pregnant  breast-feeding How should I use this medicine? This medicine is for injection under the skin. It is given by a health care professional in a hospital or clinic setting. A special MedGuide will be given to you before each treatment. Be sure to read this information carefully each time. For Prolia, talk to your pediatrician regarding the use of this medicine in children. Special care may be needed. For Xgeva, talk to your pediatrician regarding the use of this medicine in children. While this drug may be prescribed for children as young as 13 years for selected conditions, precautions do apply. Overdosage: If you think you have taken too much of this medicine contact a poison control center or emergency room at once. NOTE: This medicine is only for you. Do not share this medicine with others. What if I miss a dose? It is  important not to miss your dose. Call your doctor or health care professional if you are unable to keep an appointment. What may interact with this medicine? Do not take this medicine with any of the following medications:  other medicines containing denosumab This medicine may also interact with the following medications:  medicines that lower your chance of fighting infection  steroid medicines like prednisone or cortisone This list may not describe all possible interactions. Give your health care provider a list of all the medicines, herbs, non-prescription drugs, or dietary supplements you use. Also tell them if you smoke, drink alcohol, or use illegal drugs. Some items may interact with your medicine. What should I watch for while using this medicine? Visit your doctor or health care professional for regular checks on your progress. Your doctor or health care professional may order blood tests and other tests to see how you are doing. Call your doctor or health care professional for advice if you get a fever, chills or sore throat, or other symptoms of a cold or flu. Do not treat yourself. This drug may decrease your body's ability to fight infection. Try to avoid being around people who are sick. You should make sure you get enough calcium and vitamin D while you are taking this medicine, unless your doctor tells you not to. Discuss the foods you eat and the vitamins you take with your health care professional. See your dentist regularly. Brush and floss your teeth as directed. Before you have any dental work done, tell your dentist you are   receiving this medicine. Do not become pregnant while taking this medicine or for 5 months after stopping it. Talk with your doctor or health care professional about your birth control options while taking this medicine. Women should inform their doctor if they wish to become pregnant or think they might be pregnant. There is a potential for serious side  effects to an unborn child. Talk to your health care professional or pharmacist for more information. What side effects may I notice from receiving this medicine? Side effects that you should report to your doctor or health care professional as soon as possible:  allergic reactions like skin rash, itching or hives, swelling of the face, lips, or tongue  bone pain  breathing problems  dizziness  jaw pain, especially after dental work  redness, blistering, peeling of the skin  signs and symptoms of infection like fever or chills; cough; sore throat; pain or trouble passing urine  signs of low calcium like fast heartbeat, muscle cramps or muscle pain; pain, tingling, numbness in the hands or feet; seizures  unusual bleeding or bruising  unusually weak or tired Side effects that usually do not require medical attention (report to your doctor or health care professional if they continue or are bothersome):  constipation  diarrhea  headache  joint pain  loss of appetite  muscle pain  runny nose  tiredness  upset stomach This list may not describe all possible side effects. Call your doctor for medical advice about side effects. You may report side effects to FDA at 1-800-FDA-1088. Where should I keep my medicine? This medicine is only given in a clinic, doctor's office, or other health care setting and will not be stored at home. NOTE: This sheet is a summary. It may not cover all possible information. If you have questions about this medicine, talk to your doctor, pharmacist, or health care provider.  2021 [employer] Standard (2017-11-05 16:10:44)

## 2020-09-12 NOTE — Progress Notes (Signed)
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   *Total Encounter Time as defined by the Centers for Medicare and Medicaid Services includes, in addition to the face-to-face time of a patient visit (documented in the note above) non-face-to-face time: obtaining and reviewing outside history, ordering and reviewing medications, tests or procedures, care coordination (communications with other health care professionals or caregivers) and documentation in the medical record.

## 2020-09-13 ENCOUNTER — Telehealth: Payer: Self-pay | Admitting: Oncology

## 2020-09-13 LAB — CANCER ANTIGEN 27.29: CA 27.29: 221 U/mL — ABNORMAL HIGH (ref 0.0–38.6)

## 2020-09-13 NOTE — Telephone Encounter (Signed)
No 3/3 LOS. No changes made to pt's schedule.

## 2020-09-16 ENCOUNTER — Telehealth: Payer: Self-pay

## 2020-09-16 NOTE — Telephone Encounter (Signed)
Returned call to pt's daughter [name]. Daughter reported pt feeling sleepy and having jaw pain and some numb/ting in her lips (post injection). Instructed daughter to call back if the discomfort and tiredness did not start getting better or got any worse. Daughter verbalized understanding.  Also spoke to [facility] and verified they are working on setting pt up with Dr. [name]

## 2020-09-19 ENCOUNTER — Telehealth: Payer: Self-pay | Admitting: Oncology

## 2020-09-19 NOTE — Telephone Encounter (Signed)
Patient referred by Dr Lurline Del for Recurrent Breast CA.  Appt made for 09/23/20 Consult 3:00 pm

## 2020-09-23 ENCOUNTER — Encounter: Payer: Self-pay | Admitting: Oncology

## 2020-09-23 ENCOUNTER — Other Ambulatory Visit: Payer: Self-pay | Admitting: Oncology

## 2020-09-23 ENCOUNTER — Inpatient Hospital Stay (INDEPENDENT_AMBULATORY_CARE_PROVIDER_SITE_OTHER): Payer: Medicare Other | Admitting: Oncology

## 2020-09-23 ENCOUNTER — Other Ambulatory Visit: Payer: Self-pay

## 2020-09-23 VITALS — BP 152/81 | HR 94 | Temp 98.5°F | Resp 18 | Ht 65.5 in | Wt 172.8 lb

## 2020-09-23 DIAGNOSIS — C50212 Malignant neoplasm of upper-inner quadrant of left female breast: Secondary | ICD-10-CM

## 2020-09-23 DIAGNOSIS — Z170 Estrogen receptor positive status [ER+]: Secondary | ICD-10-CM

## 2020-09-23 DIAGNOSIS — C7951 Secondary malignant neoplasm of bone: Secondary | ICD-10-CM

## 2020-09-23 NOTE — Progress Notes (Signed)
Amy Kline  [address] [place],  [place]  [address] [phone]  Clinic Day:  09/23/2020  Referring physician: Raina Kline., MD  This document serves as a record of services personally performed by Amy Poisson, MD. It was created on their behalf by [facility], a trained medical scribe. The creation of this record is based on the scribe's personal observations and the provider's statements to them.  CHIEF COMPLAINT:  CC: Recurrent breast cancer  Current Treatment:  Trastuzumab every 3 weeks, denosumab every 6 weeks, anastrozole 1 mg daily  HISTORY OF PRESENT ILLNESS:  Amy Kline is a 87 y.o. female who presents for a transfer of care and continued management and evaluation of recurrent breast cancer.  She was originally diagnosed with a stage IA left breast cancer in May 2013 when biopsy confirmed invasive ductal carcinoma, grade 2.  Estrogen and progesterone receptors were positive at 100% and 96%, and HER2 was negative.  She was treated with left mastectomy in June and repeat HER2 was positive by [facility].  She followed with Dr. Lurline Kline, and was started on tamoxifene 20 mg daily in July 2013, but this was discontinued in July 2017 due to concerns regarding endometrial hyperplasia.  She completed one year of therapy with trastuzumab in September 2014.  She did have some cardiotoxicity with this therapy, and so this had to be held this for 5-6 weeks.  However, in the summer of 2021 she started having trouble walking with generalized lower extremity weakness and lower back pain.  She also was having episodes of syncope.  MRI brain from August was negative, but MRI lumbar spine from December revealed STIR hyperintense foci at T12-L1 and S1.  MRI pelvis showed similar areas, and in addition, numerous enhancing marrow replacing lesions in the pelvis.  Bone marrow biopsy from January 2022 showed no evidence of carcinoma and a normocellular  marrow.  CT chest, abdomen and pelvis showed increased left axillary and subpectoral lymphadenopathy but no other evidence of metastatic disease.  She underwent left axillary lymph node biopsy on February 18th which confirmed triple positive carcinoma involving fatty tissue.  She was subsequently placed on anastrozole which she started on Febuary 28th, and denosumab was initiated on March 3rd.  Trastuzumab was recommended, but this has not been initiated yet.  ECHO from September 2021 revealed an ejection fraction between 60-65%.  Tumor markers from January revealed a CA 27-29 of 87, a CEA of 6.65 and CA 19-9 was normal.  Recent CA 27-29 from March is significantly elevated at 221, and CEA was 10.12.  INTERVAL HISTORY:  [name] still has lower extremity weakness, and comes into the clinic in a wheelchair.  At home, she uses a cane.  She has been placed on Mestinon 60 mg, 1/2 tablet TID.  She notes neuropathy of the feet, worse at night.  She is currently on Sevella 50 mg BID, with initial improvement.  She is status post splenectomy, but states that she has not received the usual vaccinations, and so I recommend that she obtain these.  We will be glad to arrange that for pneumococcus, hemophilus influenzae and meningococcus.  She received denosumab on March 3rd, and notes that this caused pain of the left jaw, worsening weakness, and fatigue, so I am hesitant to continue this medication.  She continues anastrozole daily without significant difficulty.  Lab work from March 3rd was unremarkable except for a calcium of 10.5.  She does have hyperparathyroidism.  Her  appetite is good, and she is eating well.  She denies any significant weight loss or gain.  She denies fever, chills or other signs of infection.  She denies nausea, vomiting, bowel issues, or abdominal pain.  She denies sore throat, cough, dyspnea, or chest pain.  Her daughter Amy Kline accompanies her today.    REVIEW OF SYSTEMS:  Review of Systems   Constitutional: Negative.  Negative for appetite change, chills, fatigue, fever and unexpected weight change.  HENT:  Negative.   Eyes: Negative.   Respiratory: Negative.  Negative for chest tightness, cough, hemoptysis, shortness of breath and wheezing.   Cardiovascular: Negative.  Negative for chest pain, leg swelling and palpitations.  Gastrointestinal: Negative.  Negative for abdominal distention, abdominal pain, blood in stool, constipation, diarrhea, nausea and vomiting.  Endocrine: Negative.   Genitourinary: Negative.  Negative for difficulty urinating, dysuria, frequency and hematuria.   Musculoskeletal: Positive for gait problem. Negative for arthralgias, back pain, flank pain and myalgias.  Skin: Negative.   Neurological: Positive for extremity weakness, gait problem and numbness (neuropathy, worse at night). Negative for dizziness, headaches, light-headedness, seizures and speech difficulty.  Hematological: Negative.   Psychiatric/Behavioral: Negative.  Negative for depression and sleep disturbance. The patient is not nervous/anxious.      VITALS:  Blood pressure (!) 152/81, pulse 94, temperature 98.5 F (36.9 C), temperature source Oral, resp. rate 18, height 5' 5.5" (1.664 m), weight 172 lb 12.8 oz (78.4 kg), SpO2 96 %.  Wt Readings from Last 3 Encounters:  09/23/20 172 lb 12.8 oz (78.4 kg)  09/12/20 175 lb 1.6 oz (79.4 kg)  09/02/20 175 lb 1.6 oz (79.4 kg)    Body mass index is 28.32 kg/m.  Performance status (ECOG): 2 - Symptomatic, <50% confined to bed  PHYSICAL EXAM:  Physical Exam Constitutional:      General: She is not in acute distress.    Appearance: Normal appearance. She is normal weight.  HENT:     Head: Normocephalic and atraumatic.  Eyes:     General: No scleral icterus.    Extraocular Movements: Extraocular movements intact.     Conjunctiva/sclera: Conjunctivae normal.     Pupils: Pupils are equal, round, and reactive to light.  Cardiovascular:      Rate and Rhythm: Normal rate and regular rhythm.     Pulses: Normal pulses.     Heart sounds: Murmur heard.   Systolic murmur is present with a grade of 1/6. No friction rub. No gallop.   Pulmonary:     Effort: Pulmonary effort is normal. No respiratory distress.     Breath sounds: Normal breath sounds.  Chest:     Comments: Left mastectomy is negative.  Medial and lateral aspect of the left upper chest there is some firmness.  I can feel at least two lymph nodes measuring at least 2 cm or more in size in the left axilla. Abdominal:     General: Bowel sounds are normal. There is no distension.     Palpations: Abdomen is soft. There is no hepatomegaly, splenomegaly or mass.     Tenderness: There is no abdominal tenderness.     Comments: She has both right upper quadrant and left upper quadrant scars.  Musculoskeletal:        General: Normal range of motion.     Cervical back: Normal range of motion and neck supple.     Right lower leg: No edema.     Left lower leg: No edema.  Comments: She has shiny skin consistent with peripheral vascular disease.  Lymphadenopathy:     Cervical: No cervical adenopathy.  Skin:    General: Skin is warm and dry.  Neurological:     General: No focal deficit present.     Mental Status: She is alert and oriented to person, place, and time. Mental status is at baseline.  Psychiatric:        Mood and Affect: Mood normal.        Behavior: Behavior normal.        Thought Content: Thought content normal.        Judgment: Judgment normal.     LABS:   CBC Latest Ref Rng & Units 09/12/2020 07/22/2020 12/28/2016  WBC 4.0 - 10.5 K/uL 9.9 9.8 8.8  Hemoglobin 12.0 - 15.0 g/dL 14.7 15.5(H) 16.1(H)  Hematocrit 36.0 - 46.0 % 43.6 45.6 47.4(H)  Platelets 150 - 400 K/uL 393 366 320   CMP Latest Ref Rng & Units 09/12/2020 07/22/2020 03/04/2020  Glucose 70 - 99 mg/dL 114(H) 109(H) 101(H)  BUN 8 - 23 mg/dL '18 18 21  ' Creatinine 0.44 - 1.00 mg/dL 0.83 0.78 0.69   Sodium 135 - 145 mmol/L 136 137 136  Potassium 3.5 - 5.1 mmol/L 3.6 3.4(L) 4.4  Chloride 98 - 111 mmol/L 100 101 99  CO2 22 - 32 mmol/L '27 26 23  ' Calcium 8.9 - 10.3 mg/dL 10.5(H) 10.5(H) 10.3  Total Protein 6.5 - 8.1 g/dL 7.7 7.4 -  Total Bilirubin 0.3 - 1.2 mg/dL 0.5 0.5 -  Alkaline Phos 38 - 126 U/L 244(H) 190(H) -  AST 15 - 41 U/L 39 24 -  ALT 0 - 44 U/L 27 14 -     Lab Results  Component Value Date   CEA1 10.12 (H) 09/12/2020   /  CEA ([facility])  Date Value Ref Range Status  09/12/2020 10.12 (H) 0.00 - 5.00 ng/mL Final    Comment:    (NOTE) This test was performed using [employer] Chemiluminescent Microparticle Immunoassay. Values obtained from different assay methods cannot be used interchangeably. Please note that 5-10% of patients who smoke may see CEA levels up to 6.9 ng/mL. Performed at [facility], [place] [address], [place], [place] [address]     Lab Results  Component Value Date   [phone] 23.7 07/22/2020     STUDIES:  US BREAST LTD UNI RIGHT INC AXILLA  Result Date: 08/30/2020 CLINICAL DATA:  87 year old female status post remote left mastectomy for invasive mammary carcinoma presents with suspicious left axillary lymphadenopathy on recent CT EXAM: DIGITAL DIAGNOSTIC UNILATERAL RIGHT MAMMOGRAM WITH TOMOSYNTHESIS AND CAD; [place] AXILLARY LEFT; ULTRASOUND RIGHT BREAST LIMITED TECHNIQUE: Right digital diagnostic mammography and breast tomosynthesis was performed. The images were evaluated with computer-aided detection.; Targeted ultrasound examination of the left axilla was performed.; Targeted ultrasound examination of the right breast was performed COMPARISON:  Previous exam(s). ACR Breast Density Category b: There are scattered areas of fibroglandular density. FINDINGS: The patient is status post left mastectomy. There is an oval, circumscribed equal density mass in the central medial right breast at middle depth adjacent to a coil shaped.  No additional suspicious findings in the remainder of the right breast. Targeted ultrasound is performed, showing an oval, circumscribed anechoic mass at the 2:30 position 3 cm from nipple. It measures 6 x 5 x 4 mm. This is seen adjacent to a post biopsy clip at the 2 o'clock position 2 cm from the nipple. This correlates  well with the mammographic finding and is consistent with a benign simple cyst. Evaluation of the left axilla demonstrates multiple morphologically abnormal left axillary lymph nodes demonstrating diffuse hypoechogenicity incomplete hilar effacement. An irregular mass is seen originating from 1 of the lymph nodes measuring up to 1.7 cm. IMPRESSION: 1. Highly suspicious left axillary lymphadenopathy. Recommendation is for ultrasound-guided biopsy. 2. No mammographic or sonographic evidence of malignancy on the right. RECOMMENDATION: Ultrasound-guided biopsy of the left axilla. This is scheduled for later the same day. Please see additional dictation. I have discussed the findings and recommendations with the patient. If applicable, a reminder letter will be sent to the patient regarding the next appointment. BI-RADS CATEGORY  5: Highly suggestive of malignancy. Electronically Signed   By: Kristopher Oppenheim M.D.   On: 08/30/2020 11:33   MM DIAG BREAST TOMO UNI RIGHT  Result Date: 08/30/2020 CLINICAL DATA:  87 year old female status post remote left mastectomy for invasive mammary carcinoma presents with suspicious left axillary lymphadenopathy on recent CT EXAM: DIGITAL DIAGNOSTIC UNILATERAL RIGHT MAMMOGRAM WITH TOMOSYNTHESIS AND CAD; [place] AXILLARY LEFT; ULTRASOUND RIGHT BREAST LIMITED TECHNIQUE: Right digital diagnostic mammography and breast tomosynthesis was performed. The images were evaluated with computer-aided detection.; Targeted ultrasound examination of the left axilla was performed.; Targeted ultrasound examination of the right breast was performed COMPARISON:  Previous exam(s). ACR Breast  Density Category b: There are scattered areas of fibroglandular density. FINDINGS: The patient is status post left mastectomy. There is an oval, circumscribed equal density mass in the central medial right breast at middle depth adjacent to a coil shaped. No additional suspicious findings in the remainder of the right breast. Targeted ultrasound is performed, showing an oval, circumscribed anechoic mass at the 2:30 position 3 cm from nipple. It measures 6 x 5 x 4 mm. This is seen adjacent to a post biopsy clip at the 2 o'clock position 2 cm from the nipple. This correlates well with the mammographic finding and is consistent with a benign simple cyst. Evaluation of the left axilla demonstrates multiple morphologically abnormal left axillary lymph nodes demonstrating diffuse hypoechogenicity incomplete hilar effacement. An irregular mass is seen originating from 1 of the lymph nodes measuring up to 1.7 cm. IMPRESSION: 1. Highly suspicious left axillary lymphadenopathy. Recommendation is for ultrasound-guided biopsy. 2. No mammographic or sonographic evidence of malignancy on the right. RECOMMENDATION: Ultrasound-guided biopsy of the left axilla. This is scheduled for later the same day. Please see additional dictation. I have discussed the findings and recommendations with the patient. If applicable, a reminder letter will be sent to the patient regarding the next appointment. BI-RADS CATEGORY  5: Highly suggestive of malignancy. Electronically Signed   By: Kristopher Oppenheim M.D.   On: 08/30/2020 11:33   [place] AXILLARY NODE CORE BIOPSY LEFT  Addendum Date: 09/09/2020   ADDENDUM REPORT: 09/02/2020 12:17 ADDENDUM: Pathology revealed CARCINOMA INVOLVING ADIPOSE TISSUE of the LEFT axilla. No lymph node tissue is identified. This was found to be concordant by Dr. Kristopher Oppenheim. Pathology results were discussed with the patient by telephone. The patient reported doing well after the biopsy with minimal tenderness at the  site. Post biopsy instructions and care were reviewed and questions were answered. The patient was encouraged to call [facility] for any additional concerns. My direct phone number was provided. The patient is scheduled to see Dr. Tressa Busman at [facility] on September 02, 2020. Pathology results reported by Terie Purser, RN on 09/02/2020. Electronically Signed  By: Kristopher Oppenheim M.D.   On: 09/02/2020 12:17   Result Date: 09/09/2020 CLINICAL DATA:  Year old female with suspicious left axillary lymphadenopathy EXAM: [place] AXILLARY NODE CORE BIOPSY LEFT COMPARISON:  Previous exam(s). PROCEDURE: I met with the patient and we discussed the procedure of ultrasound-guided biopsy, including benefits and alternatives. We discussed the high likelihood of a successful procedure. We discussed the risks of the procedure, including infection, bleeding, tissue injury, clip migration, and inadequate sampling. Informed written consent was given. The usual time-out protocol was performed immediately prior to the procedure. Using sterile technique and 1% Lidocaine as local anesthetic, under direct ultrasound visualization, a 14 gauge spring-loaded device was used to perform biopsy of of an enlarged left axillary lymph node using a inferior approach. At the conclusion of the procedure a tribell tissue marker clip was deployed into the biopsy cavity. Follow up 2 view mammogram was performed and dictated separately. IMPRESSION: Ultrasound guided biopsy of the left axilla. No apparent complications. Electronically Signed: By: Kristopher Oppenheim M.D. On: 08/30/2020 13:44   [place] AXILLA LEFT  Result Date: 08/30/2020 CLINICAL DATA:  87 year old female status post remote left mastectomy for invasive mammary carcinoma presents with suspicious left axillary lymphadenopathy on recent CT EXAM: DIGITAL DIAGNOSTIC UNILATERAL RIGHT MAMMOGRAM WITH TOMOSYNTHESIS AND CAD; [place] AXILLARY LEFT; ULTRASOUND RIGHT  BREAST LIMITED TECHNIQUE: Right digital diagnostic mammography and breast tomosynthesis was performed. The images were evaluated with computer-aided detection.; Targeted ultrasound examination of the left axilla was performed.; Targeted ultrasound examination of the right breast was performed COMPARISON:  Previous exam(s). ACR Breast Density Category b: There are scattered areas of fibroglandular density. FINDINGS: The patient is status post left mastectomy. There is an oval, circumscribed equal density mass in the central medial right breast at middle depth adjacent to a coil shaped. No additional suspicious findings in the remainder of the right breast. Targeted ultrasound is performed, showing an oval, circumscribed anechoic mass at the 2:30 position 3 cm from nipple. It measures 6 x 5 x 4 mm. This is seen adjacent to a post biopsy clip at the 2 o'clock position 2 cm from the nipple. This correlates well with the mammographic finding and is consistent with a benign simple cyst. Evaluation of the left axilla demonstrates multiple morphologically abnormal left axillary lymph nodes demonstrating diffuse hypoechogenicity incomplete hilar effacement. An irregular mass is seen originating from 1 of the lymph nodes measuring up to 1.7 cm. IMPRESSION: 1. Highly suspicious left axillary lymphadenopathy. Recommendation is for ultrasound-guided biopsy. 2. No mammographic or sonographic evidence of malignancy on the right. RECOMMENDATION: Ultrasound-guided biopsy of the left axilla. This is scheduled for later the same day. Please see additional dictation. I have discussed the findings and recommendations with the patient. If applicable, a reminder letter will be sent to the patient regarding the next appointment. BI-RADS CATEGORY  5: Highly suggestive of malignancy. Electronically Signed   By: Kristopher Oppenheim M.D.   On: 08/30/2020 11:33    EXAM: 06/21/2020 MRI LUMBAR SPINE WITHOUT CONTRAST  TECHNIQUE: Multiplanar,  multisequence MR imaging of the lumbar spine was performed. No intravenous contrast was administered.  COMPARISON:  03/22/2019  FINDINGS: Segmentation:  Standard.  Alignment: Grade 1 stepwise retrolisthesis at the L1-4 level. Lumbar dextrocurvature.  Vertebrae: Vertebral body heights are preserved. Scattered hemangiomata. Multilevel Modic type 2 endplate degenerative changes. 0.7 cm T1/T2 hypointensity at the L1 level demonstrating STIR hyperintense signal (4:6). Similar signal intensity lesion measuring 1.3 cm in craniocaudal dimension along the left SI joint (  4:18). Focal STIR hyperintensity measuring less insert 0.7 cm involving the right T12 pedicle (4:8).  Conus medullaris and cauda equina: Conus extends to the L2 level. Conus and cauda equina appear normal.  Disc levels: Multilevel desiccation and disc space loss.  T12-L1: Minimal disc bulge with tiny central protrusion. Ligamentum flavum thickening and bilateral facet hypertrophy. Mild spinal canal narrowing. Patent neural foramen.  L1-2: Left predominant disc bulge with superimposed left foraminal protrusion. Ligamentum flavum thickening and bilateral facet hypertrophy. Mild spinal canal, mild right and moderate left neural foraminal narrowing.  L2-3: Disc bulge and bilateral facet hypertrophy. Ligamentum flavum thickening. Mild spinal canal and bilateral neural foraminal narrowing.  L3-4: Disc bulge with right paracentral and bilateral foraminal protrusions. Ligamentum flavum thickening and bilateral facet hypertrophy. Mild spinal canal, severe right and moderate left neural foraminal narrowing.  L4-5: Ligamentum flavum thickening and bilateral facet hypertrophy. Patent spinal canal. Mild bilateral neural foraminal narrowing.  L5-S1: Disc bulge, ligamentum flavum thickening and bilateral facet hypertrophy. Patent spinal canal. Mild right and moderate left neural foraminal narrowing.  Paraspinal and other soft  tissues: Bilateral renal cysts.  IMPRESSION: Multilevel spondylosis, grossly unchanged since prior exam.  Moderate to severe left L1-2, bilateral L3-4 and left L5-S1 neural foraminal narrowing.  Nonspecific STIR hyperintense foci involving the T12, L1 vertebral bodies and left SI joint are new since prior exam. Consider further evaluation with postcontrast lumbar spine MRI and MRI pelvis ([facility]) with and without contrast.   EXAM: 07/01/2020 MRI PELVIS WITHOUT AND WITH CONTRAST  TECHNIQUE: Multiplanar multisequence MR imaging of the pelvis was performed both before and after administration of intravenous contrast.  CONTRAST:  7 mL Gadavist IV contrast  COMPARISON:  CT 10/14/2015.  FINDINGS: Bones/Joint/Cartilage  There are numerous T1 hypointense, T2 hyperintense marrow replacing lesions scattered throughout the pelvis (see series 4, images 16-31). Lesions demonstrate enhancement on postcontrast imaging. Reference lesions include 1.6 cm lesion in the mid right iliac wing (series 4, image 24) and medial left iliac 1.6 cm lesion adjacent to the left SI joint (series 4, image 31). Rounded lesions within the supra-acetabular aspect of the bilateral iliac bones measuring 1.4 cm on the right and 1.2 cm on the left (series 4, image 22). Periosteal edema associated with mid right iliac wing lesion (series 8, image 8) without definite cortical breakthrough or soft tissue mass. No pathologic fractures. Pelvic bony ring intact without fracture or diastasis. No lesion identified within the visualized bilateral femora. Mild degenerative changes of the bilateral hips. No hip joint effusion.  Ligaments  Intact.  Muscles and Tendons  Mild tendinosis of the bilateral hamstring tendon origins. The gluteal, iliopsoas, rectus femoris, and adductor tendons appear intact without tear or significant tendinosis. No bursal fluid collections about the hips. Normal muscle bulk and signal  intensity without edema, atrophy, or fatty infiltration.  Soft tissues  No soft tissue edema or fluid collection. No enhancing soft tissue mass. No inguinal lymphadenopathy. Diverticular change within the visualized colon.  IMPRESSION: 1. Numerous enhancing marrow replacing lesions within the pelvis as described, highly suggestive of metastatic disease. No pathologic fractures are identified. 2. Periosteal edema associated with mid right iliac wing lesion without definite cortical breakthrough or soft tissue mass. 3. Mild tendinosis of the bilateral hamstring tendon origins.   EXAM: 07/01/2020 MRI LUMBAR SPINE WITHOUT AND WITH CONTRAST  TECHNIQUE: Multiplanar and multiecho pulse sequences of the lumbar spine were obtained without and with intravenous contrast.  CONTRAST:  7 mL Gadavist  COMPARISON:  Noncontrast lumbar spine  MRIs 06/21/2020 and 03/22/2019  FINDINGS: Segmentation:  Standard.  Alignment: Mild lumbar dextroscoliosis. Unchanged minimal retrolisthesis of L1 on L2, L2 on L3, L3 on L4, and L4 on L5.  Vertebrae: Subcentimeter T1 hypointense, STIR hyperintense foci in the T12 and L1 vertebral bodies enhance and are new from 2020. An enhancing lesion in the posterior left ilium is more fully evaluated on today's separate pelvic MRI. No fracture is identified. 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  Conus medullaris and cauda equina: Conus extends to the L2 level. Conus and cauda equina appear normal.  Paraspinal and other soft tissues: Small bilateral renal cysts as well as a larger, incompletely imaged cyst in the upper pole of the left kidney measuring at least 5 cm.  Disc levels:  Unchanged advanced disc degeneration from L1-2 to L5-S1 with detailed level by level description of degenerative changes and resultant stenoses deferred to the recent prior study.  IMPRESSION: Two small enhancing lesions in the T12 and L1 vertebral bodies with additional lesions in the pelvis  (more fully evaluated on today's pelvic MRI). Leading considerations are metastatic disease and multiple myeloma.   SURGICAL PATHOLOGY: 07/22/2020 BONE MARROW, ASPIRATE, CLOT, CORE:  - Normocellular marrow with mild erythroid hyperplasia.  - No evidence of carcinoma.   PERIPHERAL BLOOD:  - Essentially unremarkable.  EXAM: 08/08/2020 CT CHEST, ABDOMEN, AND PELVIS WITH CONTRAST  TECHNIQUE: Multidetector CT imaging of the chest, abdomen and pelvis was performed following the standard protocol during bolus administration of intravenous contrast.  CONTRAST:  100 mL Isovue 370  COMPARISON:  Chest CTA on 03/05/2020 and abdomen MRI on 01/18/2019  FINDINGS: CT CHEST FINDINGS  Cardiovascular: No acute findings. Aortic and coronary atherosclerotic calcification noted.  Mediastinum/Lymph Nodes: No evidence of mediastinal or hilar lymphadenopathy. Mild left axillary and subpectoral lymphadenopathy has increased since previous study. Index lymph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

## 2020-09-24 ENCOUNTER — Other Ambulatory Visit: Payer: Self-pay | Admitting: Oncology

## 2020-09-24 DIAGNOSIS — Z170 Estrogen receptor positive status [ER+]: Secondary | ICD-10-CM

## 2020-09-24 DIAGNOSIS — M8589 Other specified disorders of bone density and structure, multiple sites: Secondary | ICD-10-CM

## 2020-09-24 DIAGNOSIS — M8580 Other specified disorders of bone density and structure, unspecified site: Secondary | ICD-10-CM

## 2020-09-24 DIAGNOSIS — C7951 Secondary malignant neoplasm of bone: Secondary | ICD-10-CM

## 2020-09-24 DIAGNOSIS — D518 Other vitamin B12 deficiency anemias: Secondary | ICD-10-CM

## 2020-09-24 DIAGNOSIS — M8588 Other specified disorders of bone density and structure, other site: Secondary | ICD-10-CM

## 2020-09-24 DIAGNOSIS — C50212 Malignant neoplasm of upper-inner quadrant of left female breast: Secondary | ICD-10-CM

## 2020-09-26 ENCOUNTER — Telehealth: Payer: Self-pay | Admitting: Oncology

## 2020-09-26 NOTE — Telephone Encounter (Signed)
Per 3/15 LOS, patient scheduled for 4/6 Labs, Follow Up.  Requested patient be given Updated Appt Summary at next visit

## 2020-09-27 ENCOUNTER — Other Ambulatory Visit: Payer: Self-pay

## 2020-09-27 ENCOUNTER — Telehealth: Payer: Self-pay

## 2020-09-27 ENCOUNTER — Inpatient Hospital Stay: Payer: Medicare Other

## 2020-09-27 VITALS — BP 124/55 | HR 75 | Temp 98.2°F | Resp 16 | Ht 65.5 in | Wt 170.0 lb

## 2020-09-27 DIAGNOSIS — C7951 Secondary malignant neoplasm of bone: Secondary | ICD-10-CM

## 2020-09-27 DIAGNOSIS — C50212 Malignant neoplasm of upper-inner quadrant of left female breast: Secondary | ICD-10-CM

## 2020-09-27 DIAGNOSIS — Z5111 Encounter for antineoplastic chemotherapy: Secondary | ICD-10-CM | POA: Diagnosis not present

## 2020-09-27 MED ORDER — TRASTUZUMAB-HYALURONIDASE-OYSK 600-10000 MG-UNT/5ML ~~LOC~~ SOLN
600.0000 mg | Freq: Once | SUBCUTANEOUS | Status: AC
  Administered 2020-09-27: 600 mg via SUBCUTANEOUS
  Filled 2020-09-27: qty 5

## 2020-09-27 MED ORDER — ACETAMINOPHEN 325 MG PO TABS
650.0000 mg | ORAL_TABLET | Freq: Once | ORAL | Status: AC
  Administered 2020-09-27: 650 mg via ORAL

## 2020-09-27 MED ORDER — DIPHENHYDRAMINE HCL 25 MG PO CAPS
25.0000 mg | ORAL_CAPSULE | Freq: Once | ORAL | Status: AC
  Administered 2020-09-27: 25 mg via ORAL

## 2020-09-27 MED ORDER — ACETAMINOPHEN 325 MG PO TABS
ORAL_TABLET | ORAL | Status: AC
  Filled 2020-09-27: qty 2

## 2020-09-27 MED ORDER — DIPHENHYDRAMINE HCL 25 MG PO CAPS
ORAL_CAPSULE | ORAL | Status: AC
  Filled 2020-09-27: qty 1

## 2020-09-27 NOTE — Patient Instructions (Signed)
[facility] Discharge Instructions for Patients Receiving Chemotherapy  Today you received the following chemotherapy agents Trastuzumab.  To help prevent nausea and vomiting after your treatment, we encourage you to take your nausea medication.   Trastuzumab; Hyaluronidase injection What is this medicine? TRASTUZUMAB; HYALURONIDASE (tras TOO zoo mab / hye al ur ON i dase) is used to treat breast cancer and stomach cancer. Trastuzumab is a monoclonal antibody. Hyaluronidase is used to improve the effects of trastuzumab. This medicine may be used for other purposes; ask your health care provider or pharmacist if you have questions. COMMON BRAND NAME(S): HERCEPTIN HYLECTA What should I tell my health care provider before I take this medicine? They need to know if you have any of these conditions:  heart disease  heart failure  lung or breathing disease, like asthma  an unusual or allergic reaction to trastuzumab, or other medications, foods, dyes, or preservatives  pregnant or trying to get pregnant  breast-feeding How should I use this medicine? This medicine is for injection under the skin. It is given by a health care professional in a hospital or clinic setting. Talk to your pediatrician regarding the use of this medicine in children. This medicine is not approved for use in children. Overdosage: If you think you have taken too much of this medicine contact a poison control center or emergency room at once. NOTE: This medicine is only for you. Do not share this medicine with others. What if I miss a dose? It is important not to miss a dose. Call your doctor or health care professional if you are unable to keep an appointment. What may interact with this medicine? This medicine may interact with the following medications:  certain types of chemotherapy, such as daunorubicin, doxorubicin, epirubicin, and idarubicin This list may not describe all possible  interactions. Give your health care provider a list of all the medicines, herbs, non-prescription drugs, or dietary supplements you use. Also tell them if you smoke, drink alcohol, or use illegal drugs. Some items may interact with your medicine. What should I watch for while using this medicine? Visit your doctor for checks on your progress. Report any side effects. Continue your course of treatment even though you feel ill unless your doctor tells you to stop. Call your doctor or health care professional for advice if you get a fever, chills or sore throat, or other symptoms of a cold or flu. Do not treat yourself. Try to avoid being around people who are sick. You may experience fever, chills and shaking during your first infusion. These effects are usually mild and can be treated with other medicines. Report any side effects during the infusion to your health care professional. Fever and chills usually do not happen with later infusions. Do not become pregnant while taking this medicine or for 7 months after stopping it. Women should inform their doctor if they wish to become pregnant or think they might be pregnant. Women of child-bearing potential will need to have a negative pregnancy test before starting this medicine. There is a potential for serious side effects to an unborn child. Talk to your health care professional or pharmacist for more information. Do not breast-feed an infant while taking this medicine or for 7 months after stopping it. What side effects may I notice from receiving this medicine? Side effects that you should report to your doctor or health care professional as soon as possible:  allergic reactions like skin rash, itching or  hives, swelling of the face, lips, or tongue  breathing problems  chest pain or palpitations  cough  fever  general ill feeling or flu-like symptoms  signs of worsening heart failure like breathing problems; swelling in your legs and  feet Side effects that usually do not require medical attention (report these to your doctor or health care professional if they continue or are bothersome):  bone pain  changes in taste  diarrhea  joint pain  nausea/vomiting  unusually weak or tired  weight loss This list may not describe all possible side effects. Call your doctor for medical advice about side effects. You may report side effects to FDA at 1-800-FDA-1088. Where should I keep my medicine? This drug is given in a hospital or clinic and will not be stored at home. NOTE: This sheet is a summary. It may not cover all possible information. If you have questions about this medicine, talk to your doctor, pharmacist, or health care provider.  2021 Elsevier/Gold Standard (2017-09-17 21:54:17)    If you develop nausea and vomiting that is not controlled by your nausea medication, call the clinic.   BELOW ARE SYMPTOMS THAT SHOULD BE REPORTED IMMEDIATELY:  *FEVER GREATER THAN 100.5 F  *CHILLS WITH OR WITHOUT FEVER  NAUSEA AND VOMITING THAT IS NOT CONTROLLED WITH YOUR NAUSEA MEDICATION  *UNUSUAL SHORTNESS OF BREATH  *UNUSUAL BRUISING OR BLEEDING  TENDERNESS IN MOUTH AND THROAT WITH OR WITHOUT PRESENCE OF ULCERS  *URINARY PROBLEMS  *BOWEL PROBLEMS  UNUSUAL RASH Items with * indicate a potential emergency and should be followed up as soon as possible.  Feel free to call the clinic should you have any questions or concerns at The clinic phone number is [phone].  Please show the [place] at check-in to the Emergency Department and triage nurse.

## 2020-09-27 NOTE — Telephone Encounter (Signed)
I called & spoke with pt's daughter, [name], and told her of below.     Per Ulice Dash, pharmacist No it is for splenectomy vaccines    Pt's dtr called to ask about the injection appt on the 28th? She wanted to make sure it wasn't the bone injection, as she has changed to the oral drug.  I sent the above message to Estill Dooms, pharmacist.

## 2020-09-30 ENCOUNTER — Encounter: Payer: Self-pay | Admitting: Oncology

## 2020-09-30 ENCOUNTER — Telehealth: Payer: Self-pay

## 2020-09-30 NOTE — Telephone Encounter (Signed)
I spoke with pt. She had fatigue over the weekend. "I couldn't do anything. I guess it's my age. I had a headache the night after treatment too. My husband gave me some ibuprofen and it got better".   Pt denies N/V, diarrhea, fevers and skin rashes. I reminded pt to call us DAY OR NIGHT if temp 100.4 or over. Pt verbalized understanding. I confirmed next appt.

## 2020-10-01 ENCOUNTER — Telehealth: Payer: Self-pay

## 2020-10-01 NOTE — Telephone Encounter (Signed)
I called pt back, gave her the appt for 10/28/20@ 1020am.    Per Virgel Paling, scheduler :  I put her at 1030 on 4/18    Per Ulice Dash, pharmacist:  We can reschedule the injections. They are not urgent.  How about 4/18? Dr. [name] wanted to do them in between doses of trastuzumab   Pt called about 2 appts on the same day. She has to be here for PET scan @ 725am on 10/07/20. She is also scheduled for an injection @ 1030. She wants to know if she should have both the same day? Also, she doesn't know how long PET will take. The appt's were not made @ same time.

## 2020-10-02 ENCOUNTER — Telehealth: Payer: Self-pay | Admitting: Oncology

## 2020-10-02 ENCOUNTER — Other Ambulatory Visit: Payer: Self-pay | Admitting: Cardiology

## 2020-10-02 NOTE — Telephone Encounter (Signed)
10/02/20 Spoke with patient about all appts -Dexa,Pet and infusion.

## 2020-10-03 ENCOUNTER — Encounter: Payer: Self-pay | Admitting: Oncology

## 2020-10-07 ENCOUNTER — Inpatient Hospital Stay: Payer: Medicare Other

## 2020-10-09 ENCOUNTER — Encounter: Payer: Self-pay | Admitting: Neurology

## 2020-10-09 ENCOUNTER — Ambulatory Visit (INDEPENDENT_AMBULATORY_CARE_PROVIDER_SITE_OTHER): Payer: Medicare Other | Admitting: Neurology

## 2020-10-09 VITALS — BP 136/78 | HR 76 | Ht 65.5 in | Wt 172.5 lb

## 2020-10-09 DIAGNOSIS — C7951 Secondary malignant neoplasm of bone: Secondary | ICD-10-CM | POA: Diagnosis not present

## 2020-10-09 DIAGNOSIS — C50919 Malignant neoplasm of unspecified site of unspecified female breast: Secondary | ICD-10-CM | POA: Diagnosis not present

## 2020-10-09 DIAGNOSIS — R269 Unspecified abnormalities of gait and mobility: Secondary | ICD-10-CM | POA: Diagnosis not present

## 2020-10-09 DIAGNOSIS — G629 Polyneuropathy, unspecified: Secondary | ICD-10-CM

## 2020-10-09 DIAGNOSIS — R55 Syncope and collapse: Secondary | ICD-10-CM

## 2020-10-09 DIAGNOSIS — M5416 Radiculopathy, lumbar region: Secondary | ICD-10-CM | POA: Insufficient documentation
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
## 2020-10-14 NOTE — Progress Notes (Signed)
[place]  [address] [place],  [place]  [address] [phone]  Clinic Day:  10/16/2020  Referring physician: Raina Mina., MD  This document serves as a record of services personally performed by Hosie Poisson, MD. It was created on their behalf by [facility], a trained medical scribe. The creation of this record is based on the scribe's personal observations and the provider's statements to them.  CHIEF COMPLAINT:  CC: Recurrent breast cancer  Current Treatment:  Trastuzumab every 3 weeks, denosumab every 6 weeks, anastrozole 1 mg daily  HISTORY OF PRESENT ILLNESS:  Amy Kline is a 87 y.o. female who presents for a transfer of care and continued management and evaluation of recurrent breast cancer.  She was originally diagnosed with a stage IA left breast cancer in May 2013 when biopsy confirmed invasive ductal carcinoma, grade 2.  Estrogen and progesterone receptors were positive at 100% and 96%, and HER2 was negative.  She was treated with left mastectomy in June and repeat HER2 was positive by [facility].  She followed with Dr. Lurline Del, and was started on tamoxifene 20 mg daily in July 2013, but this was discontinued in July 2017 due to concerns regarding endometrial hyperplasia.  She completed one year of therapy with trastuzumab in September 2014.  She did have some cardiotoxicity with this therapy, and so this had to be held this for 5-6 weeks.  However, in the summer of 2021 she started having trouble walking with generalized lower extremity weakness and lower back pain.  She also was having episodes of syncope.  MRI brain from August was negative, but MRI lumbar spine from December revealed STIR hyperintense foci at T12-L1 and S1.  MRI pelvis showed similar areas, and in addition, numerous enhancing marrow replacing lesions in the pelvis.  Bone marrow biopsy from January 2022 showed no evidence of carcinoma and a normocellular  marrow.  CT chest, abdomen and pelvis showed increased left axillary and subpectoral lymphadenopathy but no other evidence of metastatic disease.  She underwent left axillary lymph node biopsy on February 18th which confirmed triple positive carcinoma involving fatty tissue.  She was subsequently placed on anastrozole which she started on Febuary 28th, and denosumab was initiated on March 3rd.  Trastuzumab was recommended, and initiated on March 18th.  ECHO from September 2021 revealed an ejection fraction between 60-65%.  Tumor markers from January revealed a CA 27-29 of 87, a CEA of 6.65 and CA 19-9 was normal.  Recent CA 27-29 from March is significantly elevated at 221, and CEA was 10.12.  Staging PET imaging from March confirmed metastatic breast cancer as evidenced by hypermetabolic left subpectoral/left axillary lymph nodes and widespread hypermetabolic osseous lesions.  There is mild hypermetabolism within an nonenlarged subcarinal lymph node and both hilar regions.  She did receive Xgeva on March 3rd, but did not tolerate this and so it has been discontinued.  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  She is status post splenectomy, and has had both Prevnar 13 and pneumovax within the last few years.  She will be due for her next pneumovax in 2024.  She will need to get her meningococcal vaccines and hemophilus influenzae  INTERVAL HISTORY:  Amy Kline is here for follow up prior to a 2nd cycle of trastuzumab.  She received her 1st cycle on March 18th and states that it did "mess her up for a few days",  but is tolerable.  She is currently on Cipro for a UTI and has had some hematuria.  Bone density scan from March revealed osteoporosis with a T-score of -3.3 of the forearm, and right femur neck measures -2.0, considered osteopenic.  Blood counts and chemistries are unremarkable except for a sodium of 131, a potassium of 3.2, and mildly elevated liver transaminases.   She is on Lasix 20 mg daily, which was started recently for edema, and she is also on telmisartan-HCTZ.  I advised that she only use Lasix as needed, and gave her a list of foods high in potassium to increase through level through diet.  Her  appetite is good, and her weight is stable since her last visit.  She denies fever, chills or other signs of infection.  She denies nausea, vomiting, bowel issues, or abdominal pain.  She denies sore throat, cough, dyspnea, or chest pain.  REVIEW OF SYSTEMS:  Review of Systems  Constitutional: Negative.  Negative for appetite change, chills, fatigue, fever and unexpected weight change.  HENT:  Negative.   Eyes: Negative.   Respiratory: Negative.  Negative for chest tightness, cough, hemoptysis, shortness of breath and wheezing.   Cardiovascular: Negative.  Negative for chest pain, leg swelling and palpitations.  Gastrointestinal: Negative.  Negative for abdominal distention, abdominal pain, blood in stool, constipation, diarrhea, nausea and vomiting.  Endocrine: Negative.   Genitourinary: Positive for hematuria. Negative for difficulty urinating, dysuria and frequency.   Musculoskeletal: Positive for gait problem (in a wheelchair). Negative for arthralgias, back pain, flank pain and myalgias.  Skin: Negative.   Neurological: Positive for extremity weakness, gait problem (in a wheelchair) and numbness (of the lower face which was present even prior to treatment). Negative for dizziness, headaches, light-headedness, seizures and speech difficulty.  Hematological: Negative.   Psychiatric/Behavioral: Negative.  Negative for depression and sleep disturbance. The patient is not nervous/anxious.      VITALS:  Blood pressure (!) 144/66, pulse 74, temperature 98.4 F (36.9 C), temperature source Oral, resp. rate 18, height 5' 5.5" (1.664 m), weight 172 lb 12.8 oz (78.4 kg), SpO2 96 %.  Wt Readings from Last 3 Encounters:  10/16/20 172 lb 12.8 oz (78.4 kg)   10/09/20 172 lb 8 oz (78.2 kg)  09/27/20 170 lb (77.1 kg)    Body mass index is 28.32 kg/m.  Performance status (ECOG): 1 - Symptomatic but completely ambulatory  PHYSICAL EXAM:  Physical Exam Constitutional:      General: She is not in acute distress.    Appearance: Normal appearance. She is normal weight.  HENT:     Head: Normocephalic and atraumatic.  Eyes:     General: No scleral icterus.    Extraocular Movements: Extraocular movements intact.     Conjunctiva/sclera: Conjunctivae normal.     Pupils: Pupils are equal, round, and reactive to light.  Cardiovascular:     Rate and Rhythm: Normal rate and regular rhythm.     Pulses: Normal pulses.     Heart sounds: Normal heart sounds. No murmur heard. No friction rub. No gallop.   Pulmonary:     Effort: Pulmonary effort is normal. No respiratory distress.     Breath sounds: Normal breath sounds.  Abdominal:     General: Bowel sounds are normal. There is no distension.     Palpations: Abdomen is soft. There is no hepatomegaly, splenomegaly or mass.     Tenderness: There is no abdominal tenderness.  Musculoskeletal:  General: Normal range of motion.     Cervical back: Normal range of motion and neck supple.     Right lower leg: No edema.     Left lower leg: No edema.  Lymphadenopathy:     Cervical: No cervical adenopathy.  Skin:    General: Skin is warm and dry.  Neurological:     General: No focal deficit present.     Mental Status: She is alert and oriented to person, place, and time. Mental status is at baseline.  Psychiatric:        Mood and Affect: Mood normal.        Behavior: Behavior normal.        Thought Content: Thought content normal.        Judgment: Judgment normal.     LABS:   CBC Latest Ref Rng & Units 10/16/2020 09/12/2020 07/22/2020  WBC - 7.3 9.9 9.8  Hemoglobin 12.0 - 16.0 13.9 14.7 15.5(H)  Hematocrit 36 - 46 42 43.6 45.6  Platelets 150 - 399 375 393 366   CMP Latest Ref Rng & Units  10/16/2020 09/12/2020 07/22/2020  Glucose 70 - 99 mg/dL - 114(H) 109(H)  BUN 4 - '21 19 18 18  ' Creatinine 0.5 - 1.1 0.9 0.83 0.78  Sodium 137 - 147 131(A) 136 137  Potassium 3.4 - 5.3 3.2(A) 3.6 3.4(L)  Chloride 99 - 108 97(A) 100 101  CO2 13 - 22 27(A) 27 26  Calcium 8.7 - 10.7 9.0 10.5(H) 10.5(H)  Total Protein 6.5 - 8.1 g/dL - 7.7 7.4  Total Bilirubin 0.3 - 1.2 mg/dL - 0.5 0.5  Alkaline Phos 25 - 125 244(A) 244(H) 190(H)  AST 13 - 35 42(A) 39 24  ALT 7 - 35 40(A) 27 14     Lab Results  Component Value Date   CEA1 10.12 (H) 09/12/2020   /  CEA ([facility])  Date Value Ref Range Status  09/12/2020 10.12 (H) 0.00 - 5.00 ng/mL Final    Comment:    (NOTE) This test was performed using [employer] Chemiluminescent Microparticle Immunoassay. Values obtained from different assay methods cannot be used interchangeably. Please note that 5-10% of patients who smoke may see CEA levels up to 6.9 ng/mL. Performed at [facility], [place] [address] [place], [place] [address]     Lab Results  Component Value Date   YYQ825 23.7 07/22/2020     STUDIES:   EXAM: 10/04/2020 DUAL X-RAY ABSORPTIOMETRY (DXA) FOR BONE MINERAL DENSITY  IMPRESSION: Nyema Drzewiecki completed a BMD test on 10/04/2020 using the [place] (analysis version: 13.60) manufactured by [employer]. The following summarizes the results of our evaluation.  PATIENT BIOGRAPHICAL: Name: Amy Kline, Amy Kline Patient ID:  [id number] [place] Birth Date: December 28, 1929  Height:     66.0 in. Gender: Female Exam Date: 10/04/2020 Weight: 171.0 lbs. Indications:  M85.89      Fractures:            Treatments:  ASSESSMENT: The BMD measured at Forearm Radius 33% is 0.588 g/cm2 with a T-score of -3.3. This patient is considered osteoporotic according to [facility]) criteria. The scan quality is good. Lumbar spine was not utilized due to degenerative changes. Site Region Measured  Measured WHO Young Adult BMD Date      Age      Classification T-score DualFemur Neck Right 10/04/2020 88.6 Osteopenia -2.0 0.764 g/cm2  Left Forearm Radius 33% 10/04/2020 88.6 Osteoporosis -3.3 0.588 g/cm2  World  Health Organization [facility]) criteria for post-menopausal, Caucasian Women: Normal:       T-score at or above -1 SD Osteopenia:   T-score between -1 and -2.5 SD Osteoporosis: T-score at or below -2.5 SD   EXAM: 10/07/2020 NUCLEAR MEDICINE PET SKULL BASE TO THIGH  TECHNIQUE: 13.5 mCi F-18 FDG was injected intravenously. Full-ring PET imaging was performed from the skull base to thigh after the radiotracer. CT data was obtained and used for attenuation correction and anatomic localization.  Fasting blood glucose: 125 mg/dl  COMPARISON:  CT chest abdomen pelvis 08/08/2020.  FINDINGS: Mediastinal blood pool activity: SUV max 3.1  Liver activity: SUV max NA  NECK:  No abnormal hypermetabolism.  Incidental CT findings:  None.  CHEST:  Hypermetabolic left subpectoral and left axillary lymph nodes measure up to 1.5 cm (3/95) with an SUV max of 15.2. 6 mm subcarinal lymph node has an SUV max of 3.7. Mild focal hypermetabolism in both hilar regions, difficult to correlate with CT without IV contrast. No hypermetabolic pulmonary nodules or right axillary lymph nodes.  Incidental CT findings:  Atherosclerotic calcification of the aorta, aortic valve and coronary arteries. Heart is enlarged. No pericardial or pleural effusion. Subpleural reticular densities are better seen on diagnostic CT chest performed 08/08/2020.  ABDOMEN/PELVIS:  No abnormal hypermetabolism in the liver, adrenal glands, spleen or pancreas. No hypermetabolic lymph nodes.  Incidental CT findings:  Liver is unremarkable. Cholecystectomy. Normal adrenal glands. Calcification and scarring in the right kidney. 5.6 cm septated low-attenuation lesion in the left kidney, as on 08/08/2020,  poorly evaluated today without IV contrast. Splenectomy. Stomach and bowel are grossly unremarkable. Atherosclerotic calcification of the aorta.  SKELETON:  Hypermetabolic lesions are seen throughout the visualized osseous structures. Index lesion in the left acetabular roof and an SUV max 8.1.  Incidental CT findings:  Degenerative changes in the spine.  IMPRESSION: 1. Metastatic breast cancer as evidenced by hypermetabolic left subpectoral/left axillary lymph nodes and widespread hypermetabolic osseous lesions. 2. Mild hypermetabolism within an nonenlarged subcarinal lymph node and both hilar regions. Continued attention on follow-up is recommended. 3. Subpleural pulmonary fibrosis, better seen on 08/08/2020. 4. Complex left renal cystic lesion, better characterized on 08/08/2020. 5. Aortic atherosclerosis (ICD10-I70.0). Coronary artery calcification.  HISTORY:   Allergies:  Allergies  Allergen Reactions  . Neosporin [Neomycin-Polymyxin-Gramicidin] Rash  . Sulfa Antibiotics Hives    itching  . Bacitracin-Polymyxin B   . Cefdinir Nausea And Vomiting  . Denosumab     Pain in jaw/head/mouth numb/weak  . Other Other (See Comments)    Polysporin causes a rash and swelling Unknown  . Statins     Current Medications: Current Outpatient Medications  Medication Sig Dispense Refill  . anastrozole (ARIMIDEX) 1 MG tablet Take 1 tablet (1 mg total) by mouth daily. 90 tablet 4  . aspirin 81 MG tablet Take 81 mg by mouth daily.    . carvedilol (COREG) 12.5 MG tablet Take 0.5 tablets (6.25 mg total) by mouth 2 (two) times daily with a meal. 90 tablet 3  . ciprofloxacin (CIPRO) 500 MG tablet Take 500 mg by mouth 2 (two) times daily.    . cyanocobalamin 1000 MCG tablet Take 1,000 mcg by mouth daily.    . ergocalciferol (VITAMIN D2) 1.25 MG (50000 UT) capsule Vitamin D2 1,250 mcg (50,000 unit) capsule  TAKE 1 CAPSULE BY MOUTH ONCE A WEEK SAME DAY EACH WEEK    . furosemide  (LASIX) 20 MG tablet Take 20 mg by mouth daily.    Marland Kitchen  lactose free nutrition (BOOST) LIQD Take 237 mLs by mouth as needed. Drinks as needed for no appetite    . Multiple Vitamins-Minerals (PRESERVISION AREDS 2 PO) Take 1 tablet by mouth in the morning and at bedtime.    Marland Kitchen omeprazole (PRILOSEC) 20 MG capsule Take 20 mg by mouth at bedtime.     . Polyethylene Glycol 3350 (MIRALAX PO) Take by mouth daily.    Marland Kitchen pyridostigmine (MESTINON) 60 MG tablet Take 0.5 tablets (30 mg total) by mouth 3 (three) times daily. 45 tablet 5  . SAVELLA 50 MG TABS tablet Take 50 mg by mouth 2 (two) times daily.    Marland Kitchen spironolactone (ALDACTONE) 25 MG tablet spironolactone 25 mg tablet    . telmisartan-hydrochlorothiazide (MICARDIS HCT) 40-12.5 MG tablet TAKE ONE-HALF TABLET BY  MOUTH DAILY . DISCARD  UNUSED 1/2 TABLET 90 tablet 3   No current facility-administered medications for this visit.     ASSESSMENT & PLAN:   Assessment:   1.  Stage IA triple positive left breast cancer, diagnosed in May 2013.  She was treated with left mastectomy in June.  She was started on tamoxifene 20 mg daily in July 2013, but this was discontinued in July 2017 due to concerns regarding endometrial hyperplasia.  She completed one year of therapy with trastuzumab in September 2014.    2.  Recurrent breast cancer, February 2022, with evidence of left axillary and regional lymph nodes as well as bone lesions.  She was subsequently placed on anastrozole which she started on Febuary 28th.  Trastuzumab every 3 weeks was recommended and started on March 18th.     3.  Significant elevation of the CA 27-29 most recent baseline measuring 221 in March.  4.  Lower extremity weakness, being evaluated by neurology.  She is currently on Mestinon 0.5 mg TID.  5.  Osteoporosis of the forearm.  She received denosumab on March 3rd, but did not tolerate this therapy due to jaw pain, weakness and fatigue, and so it was discontinued.  We will place her on  alendronate 70 mg weekly.  She will be due for repeat bone density scan in March 2024.  6.  Hypercalcemia, currently resolved.  She does have hyperparathyroidism.     7.  Status post splenectomy.  We will plan to administer the appropriate vaccinations.  8.  Hypokalemia.  I advised that she use Lasix only as needed, and gave her a list of foods to include in her diet.  9.  Hyponatremia, mild.  I advised to increase her dietary salt as well.  10.  Bone metastases.  PET scan shows widespread scattered lesions, but they are all relatively small.  Plan: She will proceed with a 2nd cycle of trastuzumab on April 8th.  We will also plan to administer the appropriate vaccinations for splenectomy within the coming months.  She knows to continue anastrozole daily.  She knows to increase potassium rich foods in her diet, as well as sodium.  If her potassium does not improve, she will need to get on an oral supplement.  We will plan to see her back in 3 weeks with CBC and CMP prior to her 3rd trastuzumab.  I reviewed her PET images with her and her family today and reviewed all of her test results. She and her daughters understand and agree with this plan of care.  I have answered their questions and she knows to call with any concerns.   I provided 45 minutes of face-to-face  time during this this encounter and > 50% was spent counseling as documented under my assessment and plan.    [name], MD [facility] [address] [place] [address] Dept: [phone] Dept Fax: [phone]   I, Rita Ohara, am acting as scribe for [name], MD  I have reviewed this report as typed by the medical scribe, and it is complete and accurate.  Hermina Barters

## 2020-10-15 ENCOUNTER — Telehealth: Payer: Medicare Other | Admitting: Emergency Medicine

## 2020-10-15 DIAGNOSIS — R399 Unspecified symptoms and signs involving the genitourinary system: Secondary | ICD-10-CM

## 2020-10-15 DIAGNOSIS — R531 Weakness: Secondary | ICD-10-CM

## 2020-10-15 NOTE — Progress Notes (Signed)
  Based on what you shared with me, feeling lightheaded and weak with the dark red blood, there is concern you could be dehydrated from a urinary infection.  Because of your age, the infection can become severe very quickly. I feel your condition warrants further evaluation and I recommend that you be seen for a face to face office visit to have your vital signs checked as well as get a urine sample to ensure the most appropriate treatment is given today.     NOTE: If you entered your credit card information for this eVisit, you will not be charged. You may see a "hold" on your card for the $35 but that hold will drop off and you will not have a charge processed.   If you are having a true medical emergency please call 911.      For an urgent face to face visit, [facility] has five urgent care centers for your convenience:     [facility] [place] Get Driving Directions [phone] [address], [place] [address] . 10 am - 6pm Monday - Friday    [facility]) Get Driving Directions [phone] [address] [place], [place] [address] . 10 am to 8 pm Monday-Friday . 12 pm to 8 pm [facility] Get Driving Directions [phone] [place], [place], [place] [address] . 8 am to 8 pm Monday-Friday . 9 am to 6 pm Saturday . 11 am to 6 pm Sunday     [facility] Get Driving Directions  [phone] [address], [place] [address] . 8 am to 8 pm Monday-Friday . 8 am to 4 pm [facility] [place] Get Driving Directions [phone] [address] [place], [place] [address] . 12 pm to 6 pm Monday-Friday      Your e-visit answers were reviewed by a board certified advanced clinical practitioner to complete your personal care plan.  Thank  you for using e-Visits.    Approximately 5 minutes was spent documenting and reviewing patient's chart.

## 2020-10-16 ENCOUNTER — Encounter: Payer: Self-pay | Admitting: Oncology

## 2020-10-16 ENCOUNTER — Inpatient Hospital Stay: Payer: Medicare Other | Attending: Oncology

## 2020-10-16 ENCOUNTER — Other Ambulatory Visit: Payer: Self-pay | Admitting: Oncology

## 2020-10-16 ENCOUNTER — Other Ambulatory Visit: Payer: Self-pay

## 2020-10-16 ENCOUNTER — Inpatient Hospital Stay (INDEPENDENT_AMBULATORY_CARE_PROVIDER_SITE_OTHER): Payer: Medicare Other | Admitting: Oncology

## 2020-10-16 ENCOUNTER — Other Ambulatory Visit: Payer: Self-pay | Admitting: Hematology and Oncology

## 2020-10-16 VITALS — BP 144/66 | HR 74 | Temp 98.4°F | Resp 18 | Ht 65.5 in | Wt 172.8 lb

## 2020-10-16 DIAGNOSIS — R531 Weakness: Secondary | ICD-10-CM | POA: Insufficient documentation

## 2020-10-16 DIAGNOSIS — C7951 Secondary malignant neoplasm of bone: Secondary | ICD-10-CM | POA: Insufficient documentation

## 2020-10-16 DIAGNOSIS — M810 Age-related osteoporosis without current pathological fracture: Secondary | ICD-10-CM | POA: Insufficient documentation

## 2020-10-16 DIAGNOSIS — E876 Hypokalemia: Secondary | ICD-10-CM | POA: Diagnosis not present

## 2020-10-16 DIAGNOSIS — Z79811 Long term (current) use of aromatase inhibitors: Secondary | ICD-10-CM | POA: Insufficient documentation

## 2020-10-16 DIAGNOSIS — Z5111 Encounter for antineoplastic chemotherapy: Secondary | ICD-10-CM | POA: Diagnosis present

## 2020-10-16 DIAGNOSIS — D518 Other vitamin B12 deficiency anemias: Secondary | ICD-10-CM

## 2020-10-16 DIAGNOSIS — N390 Urinary tract infection, site not specified: Secondary | ICD-10-CM | POA: Insufficient documentation

## 2020-10-16 DIAGNOSIS — C773 Secondary and unspecified malignant neoplasm of axilla and upper limb lymph nodes: Secondary | ICD-10-CM | POA: Diagnosis not present

## 2020-10-16 DIAGNOSIS — Z9012 Acquired absence of left breast and nipple: Secondary | ICD-10-CM | POA: Insufficient documentation

## 2020-10-16 DIAGNOSIS — C50212 Malignant neoplasm of upper-inner quadrant of left female breast: Secondary | ICD-10-CM

## 2020-10-16 DIAGNOSIS — C50912 Malignant neoplasm of unspecified site of left female breast: Secondary | ICD-10-CM | POA: Insufficient documentation

## 2020-10-16 DIAGNOSIS — Z79899 Other long term (current) drug therapy: Secondary | ICD-10-CM | POA: Insufficient documentation

## 2020-10-16 DIAGNOSIS — Z7982 Long term (current) use of aspirin: Secondary | ICD-10-CM | POA: Insufficient documentation

## 2020-10-16 DIAGNOSIS — E213 Hyperparathyroidism, unspecified: Secondary | ICD-10-CM | POA: Insufficient documentation

## 2020-10-16 DIAGNOSIS — Z23 Encounter for immunization: Secondary | ICD-10-CM | POA: Insufficient documentation

## 2020-10-16 DIAGNOSIS — Z170 Estrogen receptor positive status [ER+]: Secondary | ICD-10-CM

## 2020-10-16 DIAGNOSIS — M8588 Other specified disorders of bone density and structure, other site: Secondary | ICD-10-CM

## 2020-10-16 DIAGNOSIS — Z9081 Acquired absence of spleen: Secondary | ICD-10-CM | POA: Insufficient documentation

## 2020-10-16 DIAGNOSIS — E871 Hypo-osmolality and hyponatremia: Secondary | ICD-10-CM | POA: Insufficient documentation

## 2020-10-16 LAB — CBC AND DIFFERENTIAL
HCT: 42 (ref 36–46)
Hemoglobin: 13.9 (ref 12.0–16.0)
Neutrophils Absolute: 2.48
Platelets: 375 (ref 150–399)
WBC: 7.3

## 2020-10-16 LAB — BASIC METABOLIC PANEL
BUN: 19 (ref 4–21)
CO2: 27 — AB (ref 13–22)
Chloride: 97 — AB (ref 99–108)
Creatinine: 0.9 (ref 0.5–1.1)
Glucose: 88
Potassium: 3.2 — AB (ref 3.4–5.3)
Sodium: 131 — AB (ref 137–147)

## 2020-10-16 LAB — HEPATIC FUNCTION PANEL
ALT: 40 — AB (ref 7–35)
AST: 42 — AB (ref 13–35)
Alkaline Phosphatase: 244 — AB (ref 25–125)
Bilirubin, Total: 0.4

## 2020-10-16 LAB — COMPREHENSIVE METABOLIC PANEL
Albumin: 4 (ref 3.5–5.0)
Calcium: 9 (ref 8.7–10.7)

## 2020-10-16 LAB — CBC: RBC: 4.51 (ref 3.87–5.11)

## 2020-10-16 LAB — VITAMIN D 25 HYDROXY (VIT D DEFICIENCY, FRACTURES): Vit D, 25-Hydroxy: 23.92 ng/mL — ABNORMAL LOW (ref 30–100)

## 2020-10-16 LAB — VITAMIN B12: Vitamin B-12: 3696 pg/mL — ABNORMAL HIGH (ref 180–914)

## 2020-10-16 MED ORDER — ALENDRONATE SODIUM 70 MG PO TABS: 70.0000 mg | ORAL_TABLET | ORAL | 3 refills | Status: DC

## 2020-10-17 LAB — CEA (IN HOUSE-CHCC): CEA (CHCC-In House): 11.55 ng/mL — ABNORMAL HIGH (ref 0.00–5.00)

## 2020-10-17 LAB — CANCER ANTIGEN 27.29: CA 27.29: 288.3 U/mL — ABNORMAL HIGH (ref 0.0–38.6)

## 2020-10-17 NOTE — Addendum Note (Signed)
Addended by: Juanetta Beets on: 10/17/2020 09:52 AM   Modules accepted: Orders

## 2020-10-18 ENCOUNTER — Inpatient Hospital Stay: Payer: Medicare Other

## 2020-10-18 ENCOUNTER — Other Ambulatory Visit: Payer: Self-pay

## 2020-10-18 VITALS — BP 148/74 | HR 78 | Temp 98.3°F | Resp 18 | Ht 65.5 in | Wt 171.2 lb

## 2020-10-18 DIAGNOSIS — C50212 Malignant neoplasm of upper-inner quadrant of left female breast: Secondary | ICD-10-CM

## 2020-10-18 DIAGNOSIS — C7951 Secondary malignant neoplasm of bone: Secondary | ICD-10-CM

## 2020-10-18 DIAGNOSIS — Z5111 Encounter for antineoplastic chemotherapy: Secondary | ICD-10-CM | POA: Diagnosis not present

## 2020-10-18 MED ORDER — ACETAMINOPHEN 325 MG PO TABS: 650.0000 mg | ORAL_TABLET | Freq: Once | ORAL | Status: DC

## 2020-10-18 MED ORDER — TRASTUZUMAB-HYALURONIDASE-OYSK 600-10000 MG-UNT/5ML ~~LOC~~ SOLN
600.0000 mg | Freq: Once | SUBCUTANEOUS | Status: AC
  Administered 2020-10-18: 600 mg via SUBCUTANEOUS
  Filled 2020-10-18: qty 5

## 2020-10-18 MED ORDER — DIPHENHYDRAMINE HCL 25 MG PO CAPS: 25.0000 mg | ORAL_CAPSULE | Freq: Once | ORAL | Status: DC

## 2020-10-18 NOTE — Patient Instructions (Signed)
Trastuzumab injection for infusion What is this medicine? TRASTUZUMAB (tras TOO zoo mab) is a monoclonal antibody. It is used to treat breast cancer and stomach cancer. This medicine may be used for other purposes; ask your health care provider or pharmacist if you have questions. COMMON BRAND NAME(S): Herceptin, Herzuma, KANJINTI, Ogivri, Ontruzant, Trazimera What should I tell my health care provider before I take this medicine? They need to know if you have any of these conditions:  heart disease  heart failure  lung or breathing disease, like asthma  an unusual or allergic reaction to trastuzumab, benzyl alcohol, or other medications, foods, dyes, or preservatives  pregnant or trying to get pregnant  breast-feeding How should I use this medicine? This drug is given as an infusion into a vein. It is administered in a hospital or clinic by a specially trained health care professional. Talk to your pediatrician regarding the use of this medicine in children. This medicine is not approved for use in children. Overdosage: If you think you have taken too much of this medicine contact a poison control center or emergency room at once. NOTE: This medicine is only for you. Do not share this medicine with others. What if I miss a dose? It is important not to miss a dose. Call your doctor or health care professional if you are unable to keep an appointment. What may interact with this medicine? This medicine may interact with the following medications:  certain types of chemotherapy, such as daunorubicin, doxorubicin, epirubicin, and idarubicin This list may not describe all possible interactions. Give your health care provider a list of all the medicines, herbs, non-prescription drugs, or dietary supplements you use. Also tell them if you smoke, drink alcohol, or use illegal drugs. Some items may interact with your medicine. What should I watch for while using this medicine? Visit your  doctor for checks on your progress. Report any side effects. Continue your course of treatment even though you feel ill unless your doctor tells you to stop. Call your doctor or health care professional for advice if you get a fever, chills or sore throat, or other symptoms of a cold or flu. Do not treat yourself. Try to avoid being around people who are sick. You may experience fever, chills and shaking during your first infusion. These effects are usually mild and can be treated with other medicines. Report any side effects during the infusion to your health care professional. Fever and chills usually do not happen with later infusions. Do not become pregnant while taking this medicine or for 7 months after stopping it. Women should inform their doctor if they wish to become pregnant or think they might be pregnant. Women of child-bearing potential will need to have a negative pregnancy test before starting this medicine. There is a potential for serious side effects to an unborn child. Talk to your health care professional or pharmacist for more information. Do not breast-feed an infant while taking this medicine or for 7 months after stopping it. Women must use effective birth control with this medicine. What side effects may I notice from receiving this medicine? Side effects that you should report to your doctor or health care professional as soon as possible:  allergic reactions like skin rash, itching or hives, swelling of the face, lips, or tongue  chest pain or palpitations  cough  dizziness  feeling faint or lightheaded, falls  fever  general ill feeling or flu-like symptoms  signs of worsening heart failure like   breathing problems; swelling in your legs and feet  unusually weak or tired Side effects that usually do not require medical attention (report to your doctor or health care professional if they continue or are bothersome):  bone pain  changes in  taste  diarrhea  joint pain  nausea/vomiting  weight loss This list may not describe all possible side effects. Call your doctor for medical advice about side effects. You may report side effects to FDA at 1-800-FDA-1088. Where should I keep my medicine? This drug is given in a hospital or clinic and will not be stored at home. NOTE: This sheet is a summary. It may not cover all possible information. If you have questions about this medicine, talk to your doctor, pharmacist, or health care provider.  2021 Elsevier/Gold Standard (2016-06-23 14:37:52)  

## 2020-10-28 ENCOUNTER — Other Ambulatory Visit: Payer: Self-pay

## 2020-10-28 ENCOUNTER — Inpatient Hospital Stay: Payer: Medicare Other

## 2020-10-28 VITALS — BP 157/79 | HR 73 | Temp 97.8°F | Resp 20 | Ht 65.5 in | Wt 172.0 lb

## 2020-10-28 DIAGNOSIS — C50212 Malignant neoplasm of upper-inner quadrant of left female breast: Secondary | ICD-10-CM

## 2020-10-28 DIAGNOSIS — C7951 Secondary malignant neoplasm of bone: Secondary | ICD-10-CM

## 2020-10-28 DIAGNOSIS — Z9081 Acquired absence of spleen: Secondary | ICD-10-CM

## 2020-10-28 DIAGNOSIS — Z5111 Encounter for antineoplastic chemotherapy: Secondary | ICD-10-CM | POA: Diagnosis not present

## 2020-10-28 MED ORDER — MENINGOCOCCAL A C Y&W-135 OLIG IM SOLR
0.5000 mL | Freq: Once | INTRAMUSCULAR | Status: AC
Start: 2020-10-28 — End: 2020-10-28
  Administered 2020-10-28: 0.5 mL via INTRAMUSCULAR
  Filled 2020-10-28: qty 0.5

## 2020-10-28 MED ORDER — HAEMOPHILUS B POLYSAC CONJ VAC IM SOLR
0.5000 mL | Freq: Once | INTRAMUSCULAR | Status: AC
Start: 2020-10-28 — End: 2020-10-28
  Administered 2020-10-28: 0.5 mL via INTRAMUSCULAR
  Filled 2020-10-28: qty 0.5

## 2020-10-28 NOTE — Patient Instructions (Signed)
Haemophilus influenzae type b Conjugate Vaccine injection What is this medicine? HAEMOPHILUS INFLUENZAE TYPE B CONJUGATE VACCINE (hem OFF fil [place] in floo En zuh type B KAN ji get VAK seen) is used to prevent infections of a Haemophilus bacteria. This medicine may be used for other purposes; ask your health care provider or pharmacist if you have questions. COMMON BRAND NAME(S): ActHIB, Hiberix, HibTITER, PedvaxHIB What should I tell my health care provider before I take this medicine? They need to know if you have any of these conditions:  bleeding disorder  Guillain-Barre syndrome  immune system problems  infection with fever  low levels of platelets in the blood  take medicines that treat or prevent blood clots  an unusual or allergic reaction to vaccines, other medicines, foods, dyes, or preservatives  pregnant or trying to get pregnant  breast-feeding How should I use this medicine? This vaccine is for injection into a muscle. It is given by a health care professional. A copy of Vaccine Information Statements will be given before each vaccination. Read this sheet carefully each time. The sheet may change frequently. Talk to your pediatrician regarding the use of this medicine in children. While this drug may be prescribed for children as young as 23 months old for selected conditions, precautions do apply. Overdosage: If you think you have taken too much of this medicine contact a poison control center or emergency room at once. NOTE: This medicine is only for you. Do not share this medicine with others. What if I miss a dose? Keep appointments for follow-up (booster) doses as directed. It is important not to miss your dose. Call your doctor or health care professional if you are unable to keep an appointment. What may interact with this medicine?  adalimumab  anakinra  infliximab  medicines that suppress your immune system  medicines that treat or prevent blood clots  like warfarin, enoxaparin, and dalteparin  medicines to treat cancer This list may not describe all possible interactions. Give your health care provider a list of all the medicines, herbs, non-prescription drugs, or dietary supplements you use. Also tell them if you smoke, drink alcohol, or use illegal drugs. Some items may interact with your medicine. What should I watch for while using this medicine? Visit your doctor for regular check-ups as directed. This vaccine, like all vaccines, may not fully protect everyone. What side effects may I notice from receiving this medicine? Side effects that you should report to your doctor or health care professional as soon as possible:  allergic reactions like skin rash, itching or hives, swelling of the face, lips, or tongue  breathing problems  extreme changes in behavior  fever over 100 degrees F  pain, tingling, numbness in the hands or feet  seizures  unusually weak or tired Side effects that usually do not require medical attention (report to your doctor or health care professional if they continue or are bothersome):  aches or pains  bruising, pain, swelling at site where injected  diarrhea  headache  loss of appetite  low-grade fever of 100 degrees F or less  nausea, vomiting  sleepy This list may not describe all possible side effects. Call your doctor for medical advice about side effects. You may report side effects to FDA at 1-800-FDA-1088. Where should I keep my medicine? This drug is given in a hospital or clinic and will not be stored at home. NOTE: This sheet is a summary. It may not cover all possible information. If you  have questions about this medicine, talk to your doctor, pharmacist, or health care provider.  2021 Elsevier/Gold Standard (2013-10-30 13:43:01) Meningococcal Diphtheria Toxoid Conjugate Vaccine What is this medicine? MENINGOCOCCAL DIPHTHERIA TOXOID CONJUGATE VACCINE (muh ning goh KOK kal dif  THEER ee uh TOK soid KON juh geyt vak SEEN) is a vaccine to protect from bacterial meningitis. This vaccine does not contain live bacteria. It will not cause a meningitis. This medicine may be used for other purposes; ask your health care provider or pharmacist if you have questions. COMMON BRAND NAME(S): Menactra, Menveo What should I tell my health care provider before I take this medicine? They need to know if you have any of these conditions:  bleeding disorder  fever or infection  history of Guillain-Barre syndrome  immune system problems  an unusual or allergic reaction to diphtheria toxoid, meningococcal vaccine, latex, other medicines, foods, dyes, or preservatives  pregnant or trying to get pregnant  breast-feeding How should I use this medicine? This medicine is for injection into a muscle. It is given by a health care professional in a hospital or clinic setting. A copy of Vaccine Information Statements will be given before each vaccination. Read this sheet carefully each time. The sheet may change frequently. Talk to your pediatrician regarding the use of this medicine in children. While some brands of this drug may be prescribed for children as young as 58 months of age for selected conditions, precautions do apply. Overdosage: If you think you have taken too much of this medicine contact a poison control center or emergency room at once. NOTE: This medicine is only for you. Do not share this medicine with others. What if I miss a dose? This does not apply. What may interact with this medicine?  adalimumab  anakinra  infliximab  medicines for organ transplant  medicines to treat cancer  medicines used during some procedures to diagnose a medical condition  other vaccines  some medicines for arthritis  steroid medicines like prednisone or cortisone This list may not describe all possible interactions. Give your health care provider a list of all the medicines,  herbs, non-prescription drugs, or dietary supplements you use. Also tell them if you smoke, drink alcohol, or use illegal drugs. Some items may interact with your medicine. What should I watch for while using this medicine? Report any side effects that are worrisome to your doctor right away. Call your doctor if you have any unusual symptoms within 6 weeks of getting this vaccine. This vaccine may not protect from all meningitis infections. Women should inform their doctor if they wish to become pregnant or think they might be pregnant. Talk to your health care professional or pharmacist for more information. What side effects may I notice from receiving this medicine? Side effects that you should report to your doctor or health care professional as soon as possible:  allergic reactions like skin rash, itching or hives, swelling of the face, lips, or tongue  breathing problems  feeling faint or lightheaded, falls  fever over 102 degrees F  muscle weakness  unusual drooping or paralysis of face Side effects that usually do not require medical attention (report to your doctor or health care professional if they continue or are bothersome):  chills  diarrhea  headache  loss of appetite  muscle aches and pains  pain at site where injected  tired This list may not describe all possible side effects. Call your doctor for medical advice about side effects. You may report  side effects to FDA at 1-800-FDA-1088. Where should I keep my medicine? This drug is given in a hospital or clinic and will not be stored at home. NOTE: This sheet is a summary. It may not cover all possible information. If you have questions about this medicine, talk to your doctor, pharmacist, or health care provider.  2021 Elsevier/Gold Standard (2009-11-19 21:41:10)

## 2020-10-30 ENCOUNTER — Other Ambulatory Visit: Payer: Self-pay | Admitting: Pharmacist

## 2020-11-06 ENCOUNTER — Other Ambulatory Visit: Payer: Self-pay | Admitting: Hematology and Oncology

## 2020-11-06 ENCOUNTER — Inpatient Hospital Stay (INDEPENDENT_AMBULATORY_CARE_PROVIDER_SITE_OTHER): Payer: Medicare Other | Admitting: Hematology and Oncology

## 2020-11-06 ENCOUNTER — Inpatient Hospital Stay: Payer: Medicare Other

## 2020-11-06 ENCOUNTER — Other Ambulatory Visit: Payer: Self-pay

## 2020-11-06 ENCOUNTER — Telehealth: Payer: Self-pay | Admitting: Hematology and Oncology

## 2020-11-06 VITALS — BP 185/85 | HR 67 | Temp 97.6°F | Resp 18 | Ht 65.5 in | Wt 174.6 lb

## 2020-11-06 DIAGNOSIS — C7951 Secondary malignant neoplasm of bone: Secondary | ICD-10-CM

## 2020-11-06 DIAGNOSIS — C50212 Malignant neoplasm of upper-inner quadrant of left female breast: Secondary | ICD-10-CM

## 2020-11-06 LAB — HEPATIC FUNCTION PANEL
ALT: 21 (ref 7–35)
AST: 31 (ref 13–35)
Alkaline Phosphatase: 177 — AB (ref 25–125)
Bilirubin, Total: 0.4

## 2020-11-06 LAB — BASIC METABOLIC PANEL
BUN: 21 (ref 4–21)
CO2: 27 — AB (ref 13–22)
Chloride: 101 (ref 99–108)
Creatinine: 0.7 (ref 0.5–1.1)
Glucose: 105
Potassium: 4 (ref 3.4–5.3)
Sodium: 133 — AB (ref 137–147)

## 2020-11-06 LAB — CBC AND DIFFERENTIAL
HCT: 40 (ref 36–46)
Hemoglobin: 13.4 (ref 12.0–16.0)
Neutrophils Absolute: 3.27
Platelets: 304 (ref 150–399)
WBC: 8.6

## 2020-11-06 LAB — COMPREHENSIVE METABOLIC PANEL
Albumin: 3.9 (ref 3.5–5.0)
Calcium: 9.9 (ref 8.7–10.7)

## 2020-11-06 LAB — CBC: RBC: 4.25 (ref 3.87–5.11)

## 2020-11-06 NOTE — Progress Notes (Signed)
[facility]  [address] [place],  [place]  [address] [phone]  Clinic Day:  11/11/2020  Referring physician: Raina Mina., MD   CHIEF COMPLAINT:  CC: An 87 year old female with history of recurrent breast cancer here for 3 week evaluation.  Current Treatment:  Trastuzumab every 3 weeks, Aldontrate , anastrozole 1 mg daily  HISTORY OF PRESENT ILLNESS:  Amy Kline is a 87 y.o. female who presents for a transfer of care and continued management and evaluation of recurrent breast cancer.  She was originally diagnosed with a stage IA left breast cancer in May 2013 when biopsy confirmed invasive ductal carcinoma, grade 2.  Estrogen and progesterone receptors were positive at 100% and 96%, and HER2 was negative.  She was treated with left mastectomy in June and repeat HER2 was positive by [facility].  She followed with Dr. Lurline Del, and was started on tamoxifene 20 mg daily in July 2013, but this was discontinued in July 2017 due to concerns regarding endometrial hyperplasia.  She completed one year of therapy with trastuzumab in September 2014.  She did have some cardiotoxicity with this therapy, and so this had to be held this for 5-6 weeks.  However, in the summer of 2021 she started having trouble walking with generalized lower extremity weakness and lower back pain.  She also was having episodes of syncope.  MRI brain from August was negative, but MRI lumbar spine from December revealed STIR hyperintense foci at T12-L1 and S1.  MRI pelvis showed similar areas, and in addition, numerous enhancing marrow replacing lesions in the pelvis.  Bone marrow biopsy from January 2022 showed no evidence of carcinoma and a normocellular marrow.  CT chest, abdomen and pelvis showed increased left axillary and subpectoral lymphadenopathy but no other evidence of metastatic disease.  She underwent left axillary lymph node biopsy on February 18th which confirmed  triple positive carcinoma involving fatty tissue.  She was subsequently placed on anastrozole which she started on Febuary 28th, and denosumab was initiated on March 3rd.  Trastuzumab was recommended, and initiated on March 18th.  ECHO from September 2021 revealed an ejection fraction between 60-65%.  Tumor markers from January revealed a CA 27-29 of 87, a CEA of 6.65 and CA 19-9 was normal.  Recent CA 27-29 from March is significantly elevated at 221, and CEA was 10.12.  Staging PET imaging from March confirmed metastatic breast cancer as evidenced by hypermetabolic left subpectoral/left axillary lymph nodes and widespread hypermetabolic osseous lesions.  There is mild hypermetabolism within an nonenlarged subcarinal lymph node and both hilar regions.  She did receive Xgeva on March 3rd, but did not tolerate this and so it has been discontinued.  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  She is status post splenectomy, and has had both Prevnar 13 and pneumovax within the last few years.  She will be due for her next pneumovax in 2024.  She will need to get her meningococcal vaccines and hemophilus influenzae.   Bone density scan from March revealed osteoporosis with a T-score of -3.3 of the forearm, and right femur neck measures -2.0, considered osteopenic.  INTERVAL HISTORY:  Amy Kline is here for evaluation prior to a 3rd cycle of trastuzumab. She tolerated cycle 2 better than the first cycle. She has been well since her last visit with occasional generalized achiness. She was due to start alendronate daily as she did not  tolerate denosumab; however, she is hesitant to start this as she is afraid she will have similar side effects from the denosumab. She has also not started her Vitamin D/ calcium supplement as her PCP asked her to stop taking these previously due to hypercalcemia. She has started her splenectomy vaccines and tolerated those well other than some  localized irritation at the injection site to her right arm. Her next scheduled dose is in June. She denies fever, chills, nausea or vomiting. She denies issue with bowel or bladder. She takes daily Miralax. She denies shortness of breath, chest pain or cough. CBC and CMP are unremarkable today.  REVIEW OF SYSTEMS:  Review of Systems  Constitutional: Negative for appetite change, chills, diaphoresis, fatigue, fever and unexpected weight change.  HENT:   Negative for hearing loss, lump/mass, mouth sores, nosebleeds, sore throat, tinnitus, trouble swallowing and voice change.   Eyes: Negative for eye problems and icterus.  Respiratory: Negative for chest tightness, cough, hemoptysis, shortness of breath and wheezing.   Cardiovascular: Negative for chest pain, leg swelling and palpitations.  Gastrointestinal: Negative for abdominal distention, abdominal pain, blood in stool, constipation, diarrhea, nausea, rectal pain and vomiting.  Endocrine: Negative for hot flashes.  Genitourinary: Negative for bladder incontinence, difficulty urinating, dyspareunia, dysuria, frequency, hematuria and nocturia.   Musculoskeletal: Positive for arthralgias and myalgias. Negative for back pain, flank pain, gait problem, neck pain and neck stiffness.  Skin: Negative for itching, rash and wound.  Neurological: Negative for dizziness, extremity weakness, gait problem, headaches, light-headedness, numbness, seizures and speech difficulty.  Hematological: Negative for adenopathy. Does not bruise/bleed easily.  Psychiatric/Behavioral: Negative for confusion, decreased concentration, depression, sleep disturbance and suicidal ideas. The patient is nervous/anxious.      VITALS:  Blood pressure (!) 185/85, pulse 67, temperature 97.6 F (36.4 C), temperature source Oral, resp. rate 18, height 5' 5.5" (1.664 m), weight 174 lb 9.6 oz (79.2 kg), SpO2 96 %.  Wt Readings from Last 3 Encounters:  11/08/20 173 lb 12 oz (78.8 kg)   11/06/20 174 lb 9.6 oz (79.2 kg)  10/28/20 172 lb (78 kg)    Body mass index is 28.61 kg/m.  Performance status (ECOG): 1 - Symptomatic but completely ambulatory  PHYSICAL EXAM:  Physical Exam Constitutional:      General: She is not in acute distress.    Appearance: Normal appearance. She is normal weight. She is not ill-appearing, toxic-appearing or diaphoretic.  HENT:     Head: Normocephalic and atraumatic.     Nose: Nose normal. No congestion or rhinorrhea.     Mouth/Throat:     Mouth: Mucous membranes are moist.     Pharynx: Oropharynx is clear. No oropharyngeal exudate or posterior oropharyngeal erythema.  Eyes:     General: No scleral icterus.       Right eye: No discharge.        Left eye: No discharge.     Extraocular Movements: Extraocular movements intact.     Conjunctiva/sclera: Conjunctivae normal.     Pupils: Pupils are equal, round, and reactive to light.  Neck:     Vascular: No carotid bruit.  Cardiovascular:     Rate and Rhythm: Normal rate and regular rhythm.     Heart sounds: No murmur heard. No friction rub. No gallop.   Pulmonary:     Effort: Pulmonary effort is normal. No respiratory distress.     Breath sounds: Normal breath sounds. No stridor. No wheezing, rhonchi or rales.  Chest:  Chest wall: No tenderness.  Abdominal:     General: Abdomen is flat. Bowel sounds are normal. There is no distension.     Palpations: There is no mass.     Tenderness: There is no abdominal tenderness. There is no right CVA tenderness, left CVA tenderness, guarding or rebound.     Hernia: No hernia is present.  Musculoskeletal:        General: No swelling, tenderness, deformity or signs of injury. Normal range of motion.     Cervical back: Normal range of motion and neck supple. No rigidity or tenderness.     Right lower leg: No edema.     Left lower leg: No edema.  Lymphadenopathy:     Cervical: No cervical adenopathy.  Skin:    General: Skin is warm and dry.      Capillary Refill: Capillary refill takes less than 2 seconds.     Coloration: Skin is not jaundiced or pale.     Findings: No bruising, erythema, lesion or rash.  Neurological:     General: No focal deficit present.     Mental Status: She is alert and oriented to person, place, and time. Mental status is at baseline.     Cranial Nerves: No cranial nerve deficit.     Sensory: No sensory deficit.     Motor: No weakness.     Coordination: Coordination normal.     Gait: Gait normal.     Deep Tendon Reflexes: Reflexes normal.  Psychiatric:        Mood and Affect: Mood normal.        Behavior: Behavior normal.        Thought Content: Thought content normal.        Judgment: Judgment normal.     LABS:   CBC Latest Ref Rng & Units 11/06/2020 10/16/2020 09/12/2020  WBC - 8.6 7.3 9.9  Hemoglobin 12.0 - 16.0 13.4 13.9 14.7  Hematocrit 36 - 46 40 42 43.6  Platelets 150 - 399 304 375 393   CMP Latest Ref Rng & Units 11/06/2020 10/16/2020 09/12/2020  Glucose 70 - 99 mg/dL - - 114(H)  BUN 4 - _0 Creatinine 0.5 - 1.1 0.7 0.9 0.83  Sodium 137 - 147 133(A) 131(A) 136  Potassium 3.4 - 5.3 4.0 3.2(A) 3.6  Chloride 99 - 108 101 97(A) 100  CO2 13 - 22 27(A) 27(A) 27  Calcium 8.7 - 10.7 9.9 9.0 10.5(H)  Total Protein 6.5 - 8.1 g/dL - - 7.7  Total Bilirubin 0.3 - 1.2 mg/dL - - 0.5  Alkaline Phos 25 - 125 177(A) 244(A) 244(H)  AST 13 - 35 31 42(A) 39  ALT 7 - 35 21 40(A) 27     Lab Results  Component Value Date   CEA1 11.55 (H) 10/16/2020   /  CEA ([facility])  Date Value Ref Range Status  10/16/2020 11.55 (H) 0.00 - 5.00 ng/mL Final    Comment:    (NOTE) This test was performed using [employer]'s Chemiluminescent Microparticle Immunoassay. Values obtained from different assay methods cannot be used interchangeably. Please note that 5-10% of patients who smoke may see CEA levels up to 6.9 ng/mL. Performed at [facility], [place] [address]  [place], [place] [address]     Lab Results  Component Value Date   GUR427 23.7 07/22/2020     STUDIES:    HISTORY:   Allergies:  Allergies  Allergen Reactions  . Neosporin [  Neomycin-Polymyxin-Gramicidin] Rash  . Sulfa Antibiotics Hives    itching  . Bacitracin-Polymyxin B   . Cefdinir Nausea And Vomiting  . Denosumab     Pain in jaw/head/mouth numb/weak  . Other Other (See Comments)    Polysporin causes a rash and swelling Unknown  . Statins     Current Medications: Current Outpatient Medications  Medication Sig Dispense Refill  . alendronate (FOSAMAX) 70 MG tablet Take 1 tablet (70 mg total) by mouth once a week. Take with a full glass of water on an empty stomach. 12 tablet 3  . anastrozole (ARIMIDEX) 1 MG tablet Take 1 tablet (1 mg total) by mouth daily. 90 tablet 4  . aspirin 81 MG tablet Take 81 mg by mouth daily.    . carvedilol (COREG) 12.5 MG tablet Take 0.5 tablets (6.25 mg total) by mouth 2 (two) times daily with a meal. 90 tablet 3  . cyanocobalamin 1000 MCG tablet Take 1,000 mcg by mouth daily.    . ergocalciferol (VITAMIN D2) 1.25 MG (50000 UT) capsule Vitamin D2 1,250 mcg (50,000 unit) capsule  TAKE 1 CAPSULE BY MOUTH ONCE A WEEK SAME DAY EACH WEEK    . lactose free nutrition (BOOST) LIQD Take 237 mLs by mouth as needed. Drinks as needed for no appetite    . Multiple Vitamins-Minerals (PRESERVISION AREDS 2 PO) Take 1 tablet by mouth in the morning and at bedtime.    Marland Kitchen omeprazole (PRILOSEC) 20 MG capsule Take 20 mg by mouth at bedtime.     . Polyethylene Glycol 3350 (MIRALAX PO) Take by mouth daily.    Marland Kitchen pyridostigmine (MESTINON) 60 MG tablet Take 0.5 tablets (30 mg total) by mouth 3 (three) times daily. 45 tablet 5  . SAVELLA 50 MG TABS tablet Take 50 mg by mouth 2 (two) times daily.    Marland Kitchen spironolactone (ALDACTONE) 25 MG tablet spironolactone 25 mg tablet    . telmisartan-hydrochlorothiazide (MICARDIS HCT) 40-12.5 MG tablet TAKE ONE-HALF TABLET BY  MOUTH DAILY .  DISCARD  UNUSED 1/2 TABLET 90 tablet 3   No current facility-administered medications for this visit.     ASSESSMENT & PLAN:   Assessment:   1.  Stage IA triple positive left breast cancer, diagnosed in May 2013.  She was treated with left mastectomy in June.  She was started on tamoxifene 20 mg daily in July 2013, but this was discontinued in July 2017 due to concerns regarding endometrial hyperplasia.  She completed one year of therapy with trastuzumab in September 2014.    2.  Recurrent breast cancer, February 2022, with evidence of left axillary and regional lymph nodes as well as bone lesions.  She was subsequently placed on anastrozole which she started on Febuary 28th.  Trastuzumab every 3 weeks was recommended and started on March 18th.  She is tolerating fairly well.    3.  Significant elevation of the CA 27-29 most recent baseline measuring 221 in March and 288 in early April.  4.  Lower extremity weakness, being evaluated by neurology.  She is currently on Mestinon 0.5 mg TID.  5.  Osteoporosis of the forearm.  She received denosumab on March 3rd, but did not tolerate this therapy due to jaw pain, weakness and fatigue, and so it was discontinued.  We  placed her on alendronate 70 mg weekly; however, she was hesitant to start due to fear of similar side effects. After discussion with her and her daughter, she will start her first dose next week.  She is due for her next bone scan in 2024.  6.  Status post splenectomy.  She has started vaccines and tolerating well. Her next scheduled vaccines are due in June.  7.  Hyponatremia, mild.  I advised to increase her dietary salt as well.   Plan: She will proceed with a 3rd cycle of trastuzumab. She knows to continue anastrozole daily. She will start alendronate next week.  We will plan to see her back in 3 weeks with CBC and CMP prior to her 4th trastuzumab.  She will hold off on calcium until we check her again in 3 weeks as she is  nervous to start since PCP asked her to stop. We did have a long discussion about the alendronate and potential side effects.   Both she and her daughter verbalize understanding of and agreement to the plans discussed today. They know to call the office should any new questions or concerns arise.     Melodye Ped, NP [facility] [address] [place] [address] Dept: [phone] Dept Fax: [phone]

## 2020-11-06 NOTE — Telephone Encounter (Signed)
Per 4/27 LOS, patient scheduled for May Appt's.  Gave patient Appt Summary 

## 2020-11-08 ENCOUNTER — Other Ambulatory Visit: Payer: Self-pay | Admitting: Hematology and Oncology

## 2020-11-08 ENCOUNTER — Inpatient Hospital Stay: Payer: Medicare Other

## 2020-11-08 ENCOUNTER — Other Ambulatory Visit: Payer: Self-pay

## 2020-11-08 VITALS — BP 152/70 | HR 64 | Temp 98.1°F | Resp 18 | Ht 65.5 in | Wt 173.8 lb

## 2020-11-08 DIAGNOSIS — C50212 Malignant neoplasm of upper-inner quadrant of left female breast: Secondary | ICD-10-CM

## 2020-11-08 DIAGNOSIS — C7951 Secondary malignant neoplasm of bone: Secondary | ICD-10-CM

## 2020-11-08 DIAGNOSIS — Z5111 Encounter for antineoplastic chemotherapy: Secondary | ICD-10-CM | POA: Diagnosis not present

## 2020-11-08 MED ORDER — TRASTUZUMAB-HYALURONIDASE-OYSK 600-10000 MG-UNT/5ML ~~LOC~~ SOLN
600.0000 mg | Freq: Once | SUBCUTANEOUS | Status: AC
  Administered 2020-11-08: 600 mg via SUBCUTANEOUS
  Filled 2020-11-08: qty 5

## 2020-11-08 MED ORDER — ACETAMINOPHEN 325 MG PO TABS: 650.0000 mg | ORAL_TABLET | Freq: Once | ORAL | Status: DC

## 2020-11-08 MED ORDER — DIPHENHYDRAMINE HCL 25 MG PO CAPS: 25.0000 mg | ORAL_CAPSULE | Freq: Once | ORAL | Status: DC

## 2020-11-08 NOTE — Patient Instructions (Addendum)
Calcium; Vitamin D oral tablets What is this medicine? CALCIUM; VITAMIN D (KAL see um; VYE ta min D) is a vitamin supplement. It is used to prevent conditions of low calcium and vitamin D. This medicine may be used for other purposes; ask your health care provider or pharmacist if you have questions. COMMON BRAND NAME(S): Calcarb 600 with Vitamin D, Calcet Plus Vitamin D, Calcitrate + D, Calcium Citrate + D3 Maximum, Calcium Citrate Maximum with D, Caltrate, Caltrate 600+D, Citracal + D, Citracal MAXIMUM + D, Citracal Petites with Vitamin D, Citrus Calcium Plus D, OSCAL 500 + D, OSCAL Calcium + D3, OSCAL Extra D3, Osteo-Poretical, Oysco 500 + D, Oysco D, Oystercal-D Calcium What should I tell my health care provider before I take this medicine? They need to know if you have any of these conditions:  constipation  dehydration  heart disease  high level of calcium or vitamin D in the blood  high level of phosphate in the blood  kidney disease  kidney stones  liver disease  parathyroid disease  sarcoidosis  stomach ulcer or obstruction  an unusual or allergic reaction to calcium, vitamin D, tartrazine dye, other medicines, foods, dyes, or preservatives  pregnant or trying to get pregnant  breast-feeding How should I use this medicine? Take this medicine by mouth with a glass of water. Follow the directions on the label. Take with food or within 1 hour after a meal. Take your medicine at regular intervals. Do not take your medicine more often than directed. Talk to your pediatrician regarding the use of this medicine in children. While this medicine may be used in children for selected conditions, precautions do apply. Overdosage: If you think you have taken too much of this medicine contact a poison control center or emergency room at once. NOTE: This medicine is only for you. Do not share this medicine with others. What if I miss a dose? If you miss a dose, take it as soon as  you can. If it is almost time for your next dose, take only that dose. Do not take double or extra doses. What may interact with this medicine? Do not take this medicine with any of the following medications:  ammonium chloride  methenamine This medicine may also interact with the following medications:  antibiotics like ciprofloxacin, gatifloxacin, tetracycline  captopril  delavirdine  diuretics  gabapentin  iron supplements  medicines for fungal infections like ketoconazole and itraconazole  medicines for seizures like ethotoin and phenytoin  mineral oil  mycophenolate  other vitamins with calcium, vitamin D, or minerals  quinidine  rosuvastatin  sucralfate  thyroid medicine This list may not describe all possible interactions. Give your health care provider a list of all the medicines, herbs, non-prescription drugs, or dietary supplements you use. Also tell them if you smoke, drink alcohol, or use illegal drugs. Some items may interact with your medicine. What should I watch for while using this medicine? Taking this medicine is not a substitute for a well-balanced diet and exercise. Talk with your doctor or health care provider and follow a healthy lifestyle. Do not take this medicine with high-fiber foods, large amounts of alcohol, or drinks containing caffeine. Do not take this medicine within 2 hours of any other medicines. What side effects may I notice from receiving this medicine? Side effects that you should report to your doctor or health care professional as soon as possible:  allergic reactions like skin rash, itching or hives, swelling of the face, lips,  or tongue  confusion  dry mouth  high blood pressure  increased hunger or thirst  increased urination  irregular heartbeat  metallic taste  muscle or bone pain  pain when urinating  seizure  unusually weak or tired  weight loss Side effects that usually do not require medical  attention (report to your doctor or health care professional if they continue or are bothersome):  constipation  diarrhea  headache  loss of appetite  nausea, vomiting  stomach upset This list may not describe all possible side effects. Call your doctor for medical advice about side effects. You may report side effects to FDA at 1-800-FDA-1088. Where should I keep my medicine? Keep out of the reach of children. Store at room temperature between 15 and 30 degrees C (59 and 86 degrees F). Protect from light. Keep container tightly closed. Throw away any unused medicine after the expiration date. NOTE: This sheet is a summary. It may not cover all possible information. If you have questions about this medicine, talk to your doctor, pharmacist, or health care provider.  2021 Elsevier/Gold Standard (2007-10-12 17:56:23) [place]  Discharge Instructions: Thank you for choosing [facility] to provide your oncology and hematology care.  If you have a lab appointment with the [facility], please go directly to the [facility] and check in at the registration area.   Wear comfortable clothing and clothing appropriate for easy access to any Portacath or PICC line.   We strive to give you quality time with your provider. You may need to reschedule your appointment if you arrive late (15 or more minutes).  Arriving late affects you and other patients whose appointments are after yours.  Also, if you miss three or more appointments without notifying the office, you may be dismissed from the clinic at the provider's discretion.      For prescription refill requests, have your pharmacy contact our office and allow 72 hours for refills to be completed.    Today you received the following chemotherapy and/or immunotherapy agents trastuzumab injection     To help prevent nausea and vomiting after your treatment, we encourage you to take your nausea  medication as directed.  BELOW ARE SYMPTOMS THAT SHOULD BE REPORTED IMMEDIATELY: . *FEVER GREATER THAN 100.4 F (38 C) OR HIGHER . *CHILLS OR SWEATING . *NAUSEA AND VOMITING THAT IS NOT CONTROLLED WITH YOUR NAUSEA MEDICATION . *UNUSUAL SHORTNESS OF BREATH . *UNUSUAL BRUISING OR BLEEDING . *URINARY PROBLEMS (pain or burning when urinating, or frequent urination) . *BOWEL PROBLEMS (unusual diarrhea, constipation, pain near the anus) . TENDERNESS IN MOUTH AND THROAT WITH OR WITHOUT PRESENCE OF ULCERS (sore throat, sores in mouth, or a toothache) . UNUSUAL RASH, SWELLING OR PAIN  . UNUSUAL VAGINAL DISCHARGE OR ITCHING   Items with * indicate a potential emergency and should be followed up as soon as possible or go to the Emergency Department if any problems should occur.  Please show the CHEMOTHERAPY ALERT CARD or IMMUNOTHERAPY ALERT CARD at check-in to the Emergency Department and triage nurse.  Should you have questions after your visit or need to cancel or reschedule your appointment, please contact [facility]  Dept: [phone]  and follow the prompts.  Office hours are 8:00 a.m. to 4:30 p.m. Monday - Friday. Please note that voicemails left after 4:00 p.m. may not be returned until the following business day.  We are closed weekends and major holidays. You  have access to a nurse at all times for urgent questions. Please call the main number to the clinic Dept: [phone] and follow the prompts.  For any non-urgent questions, you may also contact your provider using MyChart. We now offer e-Visits for anyone 42 and older to request care online for non-urgent symptoms. For details visit [email or web address].   Also download the MyChart app! Go to the app store, search "MyChart", open the app, select [facility], and log in with your MyChart username and password.  Due to Covid, a mask is required upon entering the hospital/clinic. If you do not have a mask, one  will be given to you upon arrival. For doctor visits, patients may have 1 support person aged 69 or older with them. For treatment visits, patients cannot have anyone with them due to current Covid guidelines and our immunocompromised population.

## 2020-11-11 ENCOUNTER — Encounter: Payer: Self-pay | Admitting: Hematology and Oncology

## 2020-11-12 ENCOUNTER — Other Ambulatory Visit: Payer: Self-pay | Admitting: Hematology and Oncology

## 2020-11-12 NOTE — Progress Notes (Signed)
I spoke with Dr. Kenton Kingfisher, patient's dentist, who examined patient today. He identified a third molar with a deep cavity that requires attention. As this tooth is actually an unopposed wisdom tooth, his recommendation is to refer to oral surgery to remove the tooth rather than filling it and risking complications down the road. I spoke with the patient via telephone and verified that she had not started her alendronate yet. She confirmed she had not. I advised her not to start it until after she has surgery and is completely healed. She verbalizes understanding. I then, again spoke with Dr. Kenton Kingfisher and relayed the information that Ms. Budnick was clear to proceed with procedure from our standpoint. The patient receiving trastuzumab injections every 3 weeks should not cause a concern. Her next treatment is scheduled for 05/20. Dr. Kenton Kingfisher believes they can get this scheduled and completed before then.

## 2020-11-19 ENCOUNTER — Encounter: Payer: Self-pay | Admitting: Hematology and Oncology

## 2020-11-27 ENCOUNTER — Other Ambulatory Visit: Payer: Self-pay | Admitting: Hematology and Oncology

## 2020-11-27 ENCOUNTER — Inpatient Hospital Stay: Payer: Medicare Other | Attending: Oncology

## 2020-11-27 ENCOUNTER — Other Ambulatory Visit: Payer: Self-pay

## 2020-11-27 ENCOUNTER — Inpatient Hospital Stay (INDEPENDENT_AMBULATORY_CARE_PROVIDER_SITE_OTHER): Payer: Medicare Other | Admitting: Hematology and Oncology

## 2020-11-27 VITALS — BP 181/84 | HR 69 | Temp 98.4°F | Resp 18 | Ht 65.5 in | Wt 173.8 lb

## 2020-11-27 DIAGNOSIS — C50912 Malignant neoplasm of unspecified site of left female breast: Secondary | ICD-10-CM | POA: Insufficient documentation

## 2020-11-27 DIAGNOSIS — C50212 Malignant neoplasm of upper-inner quadrant of left female breast: Secondary | ICD-10-CM

## 2020-11-27 DIAGNOSIS — Z5111 Encounter for antineoplastic chemotherapy: Secondary | ICD-10-CM | POA: Insufficient documentation

## 2020-11-27 LAB — HEPATIC FUNCTION PANEL
ALT: 20 (ref 7–35)
AST: 35 (ref 13–35)
Alkaline Phosphatase: 164 — AB (ref 25–125)
Bilirubin, Total: 0.5

## 2020-11-27 LAB — CBC AND DIFFERENTIAL
HCT: 40 (ref 36–46)
Hemoglobin: 13.1 (ref 12.0–16.0)
Neutrophils Absolute: 2.96
Platelets: 290 (ref 150–399)
WBC: 7.4

## 2020-11-27 LAB — COMPREHENSIVE METABOLIC PANEL
Albumin: 3.8 (ref 3.5–5.0)
Calcium: 9.2 (ref 8.7–10.7)

## 2020-11-27 LAB — BASIC METABOLIC PANEL
BUN: 17 (ref 4–21)
CO2: 25 — AB (ref 13–22)
Chloride: 100 (ref 99–108)
Creatinine: 0.7 (ref 0.5–1.1)
Glucose: 107
Potassium: 3.6 (ref 3.4–5.3)
Sodium: 130 — AB (ref 137–147)

## 2020-11-27 LAB — CBC: RBC: 4.25 (ref 3.87–5.11)

## 2020-11-27 MED ORDER — IBUPROFEN 600 MG PO TABS: 600.0000 mg | ORAL_TABLET | Freq: Four times a day (QID) | ORAL | 0 refills | Status: DC | PRN

## 2020-11-27 NOTE — Progress Notes (Signed)
[facility]  [address] [place],  [place]  [address] [phone]  Clinic Day:  11/27/2020  Referring physician: Raina Mina., MD   CHIEF COMPLAINT:  CC: An 87 year old female with history of recurrent breast cancer here for 3 week evaluation.  Current Treatment:  Trastuzumab every 3 weeks, Aldontrate , anastrozole 1 mg daily  HISTORY OF PRESENT ILLNESS:  Amy Kline is a 87 y.o. female who presents for a transfer of care and continued management and evaluation of recurrent breast cancer.  She was originally diagnosed with a stage IA left breast cancer in May 2013 when biopsy confirmed invasive ductal carcinoma, grade 2.  Estrogen and progesterone receptors were positive at 100% and 96%, and HER2 was negative.  She was treated with left mastectomy in June and repeat HER2 was positive by [facility].  She followed with Dr. Lurline Del, and was started on tamoxifene 20 mg daily in July 2013, but this was discontinued in July 2017 due to concerns regarding endometrial hyperplasia.  She completed one year of therapy with trastuzumab in September 2014.  She did have some cardiotoxicity with this therapy, and so this had to be held this for 5-6 weeks.  However, in the summer of 2021 she started having trouble walking with generalized lower extremity weakness and lower back pain.  She also was having episodes of syncope.  MRI brain from August was negative, but MRI lumbar spine from December revealed STIR hyperintense foci at T12-L1 and S1.  MRI pelvis showed similar areas, and in addition, numerous enhancing marrow replacing lesions in the pelvis.  Bone marrow biopsy from January 2022 showed no evidence of carcinoma and a normocellular marrow.  CT chest, abdomen and pelvis showed increased left axillary and subpectoral lymphadenopathy but no other evidence of metastatic disease.  She underwent left axillary lymph node biopsy on February 18th which confirmed  triple positive carcinoma involving fatty tissue.  She was subsequently placed on anastrozole which she started on Febuary 28th, and denosumab was initiated on March 3rd.  Trastuzumab was recommended, and initiated on March 18th.  ECHO from September 2021 revealed an ejection fraction between 60-65%.  Tumor markers from January revealed a CA 27-29 of 87, a CEA of 6.65 and CA 19-9 was normal.  Recent CA 27-29 from March is significantly elevated at 221, and CEA was 10.12.  Staging PET imaging from March confirmed metastatic breast cancer as evidenced by hypermetabolic left subpectoral/left axillary lymph nodes and widespread hypermetabolic osseous lesions.  There is mild hypermetabolism within an nonenlarged subcarinal lymph node and both hilar regions.  She did receive Xgeva on March 3rd, but did not tolerate this and so it has been discontinued.  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  She is status post splenectomy, and has had both Prevnar 13 and pneumovax within the last few years.  She will be due for her next pneumovax in 2024.  She will need to get her meningococcal vaccines and hemophilus influenzae.   Bone density scan from March revealed osteoporosis with a T-score of -3.3 of the forearm, and right femur neck measures -2.0, considered osteopenic.  INTERVAL HISTORY:  Amy Kline is here for evaluation prior to a 4th cycle of trastuzumab. She is tolerating treatment fairly well. She has been well since her last visit with occasional generalized achiness. She was due to start alendronate daily as she did not tolerate denosumab; however,  she is hesitant to start this as she is afraid she will have similar side effects from the denosumab. She is also due to have a dental procedure and is concerned to start before that.  She has also not started her Vitamin D/ calcium supplement as her PCP asked her to stop taking these previously due to hypercalcemia. She has  started her splenectomy vaccines and tolerated those well other than some localized irritation at the injection site to her right arm. Her next scheduled dose is in June. She denies fever, chills, nausea or vomiting. She denies issue with bowel or bladder. She takes daily Miralax. She denies shortness of breath, chest pain or cough. CBC and CMP are unremarkable today.  REVIEW OF SYSTEMS:  Review of Systems  Constitutional: Negative for appetite change, chills, diaphoresis, fatigue, fever and unexpected weight change.  HENT:   Negative for hearing loss, lump/mass, mouth sores, nosebleeds, sore throat, tinnitus, trouble swallowing and voice change.   Eyes: Negative for eye problems and icterus.  Respiratory: Negative for chest tightness, cough, hemoptysis, shortness of breath and wheezing.   Cardiovascular: Negative for chest pain, leg swelling and palpitations.  Gastrointestinal: Negative for abdominal distention, abdominal pain, blood in stool, constipation, diarrhea, nausea, rectal pain and vomiting.  Endocrine: Negative for hot flashes.  Genitourinary: Negative for bladder incontinence, difficulty urinating, dyspareunia, dysuria, frequency, hematuria and nocturia.   Musculoskeletal: Positive for arthralgias and myalgias. Negative for back pain, flank pain, gait problem, neck pain and neck stiffness.  Skin: Negative for itching, rash and wound.  Neurological: Negative for dizziness, extremity weakness, gait problem, headaches, light-headedness, numbness, seizures and speech difficulty.  Hematological: Negative for adenopathy. Does not bruise/bleed easily.  Psychiatric/Behavioral: Negative for confusion, decreased concentration, depression, sleep disturbance and suicidal ideas. The patient is nervous/anxious.      VITALS:  There were no vitals taken for this visit.  Wt Readings from Last 3 Encounters:  11/08/20 173 lb 12 oz (78.8 kg)  11/06/20 174 lb 9.6 oz (79.2 kg)  10/28/20 172 lb (78 kg)     There is no height or weight on file to calculate BMI.  Performance status (ECOG): 1 - Symptomatic but completely ambulatory  PHYSICAL EXAM:  Physical Exam Constitutional:      General: She is not in acute distress.    Appearance: Normal appearance. She is normal weight. She is not ill-appearing, toxic-appearing or diaphoretic.  HENT:     Head: Normocephalic and atraumatic.     Nose: Nose normal. No congestion or rhinorrhea.     Mouth/Throat:     Mouth: Mucous membranes are moist.     Pharynx: Oropharynx is clear. No oropharyngeal exudate or posterior oropharyngeal erythema.  Eyes:     General: No scleral icterus.       Right eye: No discharge.        Left eye: No discharge.     Extraocular Movements: Extraocular movements intact.     Conjunctiva/sclera: Conjunctivae normal.     Pupils: Pupils are equal, round, and reactive to light.  Neck:     Vascular: No carotid bruit.  Cardiovascular:     Rate and Rhythm: Normal rate and regular rhythm.     Heart sounds: No murmur heard. No friction rub. No gallop.   Pulmonary:     Effort: Pulmonary effort is normal. No respiratory distress.     Breath sounds: Normal breath sounds. No stridor. No wheezing, rhonchi or rales.  Chest:     Chest wall: No   tenderness.  Abdominal:     General: Abdomen is flat. Bowel sounds are normal. There is no distension.     Palpations: There is no mass.     Tenderness: There is no abdominal tenderness. There is no right CVA tenderness, left CVA tenderness, guarding or rebound.     Hernia: No hernia is present.  Musculoskeletal:        General: No swelling, tenderness, deformity or signs of injury. Normal range of motion.     Cervical back: Normal range of motion and neck supple. No rigidity or tenderness.     Right lower leg: No edema.     Left lower leg: No edema.  Lymphadenopathy:     Cervical: No cervical adenopathy.  Skin:    General: Skin is warm and dry.     Capillary Refill: Capillary  refill takes less than 2 seconds.     Coloration: Skin is not jaundiced or pale.     Findings: No bruising, erythema, lesion or rash.  Neurological:     General: No focal deficit present.     Mental Status: She is alert and oriented to person, place, and time. Mental status is at baseline.     Cranial Nerves: No cranial nerve deficit.     Sensory: No sensory deficit.     Motor: No weakness.     Coordination: Coordination normal.     Gait: Gait normal.     Deep Tendon Reflexes: Reflexes normal.  Psychiatric:        Mood and Affect: Mood normal.        Behavior: Behavior normal.        Thought Content: Thought content normal.        Judgment: Judgment normal.     LABS:   CBC Latest Ref Rng & Units 11/06/2020 10/16/2020 09/12/2020  WBC - 8.6 7.3 9.9  Hemoglobin 12.0 - 16.0 13.4 13.9 14.7  Hematocrit 36 - 46 40 42 43.6  Platelets 150 - 399 304 375 393   CMP Latest Ref Rng & Units 11/06/2020 10/16/2020 09/12/2020  Glucose 70 - 99 mg/dL - - 114(H)  BUN 4 - _0 Creatinine 0.5 - 1.1 0.7 0.9 0.83  Sodium 137 - 147 133(A) 131(A) 136  Potassium 3.4 - 5.3 4.0 3.2(A) 3.6  Chloride 99 - 108 101 97(A) 100  CO2 13 - 22 27(A) 27(A) 27  Calcium 8.7 - 10.7 9.9 9.0 10.5(H)  Total Protein 6.5 - 8.1 g/dL - - 7.7  Total Bilirubin 0.3 - 1.2 mg/dL - - 0.5  Alkaline Phos 25 - 125 177(A) 244(A) 244(H)  AST 13 - 35 31 42(A) 39  ALT 7 - 35 21 40(A) 27     Lab Results  Component Value Date   CEA1 11.55 (H) 10/16/2020   /  CEA ([facility])  Date Value Ref Range Status  10/16/2020 11.55 (H) 0.00 - 5.00 ng/mL Final    Comment:    (NOTE) This test was performed using [employer]'s Chemiluminescent Microparticle Immunoassay. Values obtained from different assay methods cannot be used interchangeably. Please note that 5-10% of patients who smoke may see CEA levels up to 6.9 ng/mL. Performed at [facility], [place] [address] [place], [place] [address]     Lab Results   Component Value Date   FXT024 23.7 07/22/2020     STUDIES:    HISTORY:   Allergies:  Allergies  Allergen Reactions  . Neosporin [Neomycin-Polymyxin-Gramicidin] Rash  .  Sulfa Antibiotics Hives    itching  . Bacitracin-Polymyxin B   . Cefdinir Nausea And Vomiting  . Denosumab     Pain in jaw/head/mouth numb/weak  . Other Other (See Comments)    Polysporin causes a rash and swelling Unknown  . Statins     Current Medications: Current Outpatient Medications  Medication Sig Dispense Refill  . alendronate (FOSAMAX) 70 MG tablet Take 1 tablet (70 mg total) by mouth once a week. Take with a full glass of water on an empty stomach. 12 tablet 3  . anastrozole (ARIMIDEX) 1 MG tablet Take 1 tablet (1 mg total) by mouth daily. 90 tablet 4  . aspirin 81 MG tablet Take 81 mg by mouth daily.    . carvedilol (COREG) 12.5 MG tablet Take 0.5 tablets (6.25 mg total) by mouth 2 (two) times daily with a meal. 90 tablet 3  . cyanocobalamin 1000 MCG tablet Take 1,000 mcg by mouth daily.    . ergocalciferol (VITAMIN D2) 1.25 MG (50000 UT) capsule Vitamin D2 1,250 mcg (50,000 unit) capsule  TAKE 1 CAPSULE BY MOUTH ONCE A WEEK SAME DAY EACH WEEK    . lactose free nutrition (BOOST) LIQD Take 237 mLs by mouth as needed. Drinks as needed for no appetite    . Multiple Vitamins-Minerals (PRESERVISION AREDS 2 PO) Take 1 tablet by mouth in the morning and at bedtime.    Marland Kitchen omeprazole (PRILOSEC) 20 MG capsule Take 20 mg by mouth at bedtime.     . Polyethylene Glycol 3350 (MIRALAX PO) Take by mouth daily.    Marland Kitchen pyridostigmine (MESTINON) 60 MG tablet Take 0.5 tablets (30 mg total) by mouth 3 (three) times daily. 45 tablet 5  . SAVELLA 50 MG TABS tablet Take 50 mg by mouth 2 (two) times daily.    Marland Kitchen spironolactone (ALDACTONE) 25 MG tablet spironolactone 25 mg tablet    . telmisartan-hydrochlorothiazide (MICARDIS HCT) 40-12.5 MG tablet TAKE ONE-HALF TABLET BY  MOUTH DAILY . DISCARD  UNUSED 1/2 TABLET 90 tablet 3    No current facility-administered medications for this visit.     ASSESSMENT & PLAN:   Assessment:   1.  Stage IA triple positive left breast cancer, diagnosed in May 2013.  She was treated with left mastectomy in June.  She was started on tamoxifene 20 mg daily in July 2013, but this was discontinued in July 2017 due to concerns regarding endometrial hyperplasia.  She completed one year of therapy with trastuzumab in September 2014.    2.  Recurrent breast cancer, February 2022, with evidence of left axillary and regional lymph nodes as well as bone lesions.  She was subsequently placed on anastrozole which she started on Febuary 28th.  Trastuzumab every 3 weeks was recommended and started on March 18th.  She is tolerating fairly well.    3.  Significant elevation of the CA 27-29 most recent baseline measuring 221 in March and 288 in early April.  4.  Lower extremity weakness, being evaluated by neurology.  She is currently on Mestinon 0.5 mg TID.  5.  Osteoporosis of the forearm.  She received denosumab on March 3rd, but did not tolerate this therapy due to jaw pain, weakness and fatigue, and so it was discontinued.  We  placed her on alendronate 70 mg weekly; however, she was hesitant to start due to fear of similar side effects. She will hold this until after dental procedure. She is due for her next bone scan in 2024.  6.  Status post splenectomy.  She has started vaccines and tolerating well. Her next scheduled vaccines are due in June.  7.  Hyponatremia, mild.  I advised to increase her dietary salt as well.   Plan: She will proceed with a 4th cycle of trastuzumab. She knows to continue anastrozole daily. We will plan to see her back in 3 weeks with CBC and CMP prior to her 5th trastuzumab.  She will hold off on calcium until we check her again in 3 weeks as she is nervous to start since PCP asked her to stop.   Both she and her daughter verbalize understanding of and agreement to  the plans discussed today. They know to call the office should any new questions or concerns arise.      A , NP [facility] [address] [place] [address] Dept: [phone] Dept Fax: [phone]        

## 2020-11-29 ENCOUNTER — Other Ambulatory Visit: Payer: Self-pay

## 2020-11-29 ENCOUNTER — Encounter: Payer: Self-pay | Admitting: Hematology and Oncology

## 2020-11-29 ENCOUNTER — Inpatient Hospital Stay: Payer: Medicare Other

## 2020-11-29 VITALS — BP 162/75 | HR 73 | Temp 97.8°F | Resp 16 | Wt 173.1 lb

## 2020-11-29 DIAGNOSIS — Z5111 Encounter for antineoplastic chemotherapy: Secondary | ICD-10-CM | POA: Diagnosis present

## 2020-11-29 DIAGNOSIS — C50912 Malignant neoplasm of unspecified site of left female breast: Secondary | ICD-10-CM | POA: Diagnosis present

## 2020-11-29 DIAGNOSIS — C7951 Secondary malignant neoplasm of bone: Secondary | ICD-10-CM

## 2020-11-29 DIAGNOSIS — C50212 Malignant neoplasm of upper-inner quadrant of left female breast: Secondary | ICD-10-CM

## 2020-11-29 MED ORDER — TRASTUZUMAB-HYALURONIDASE-OYSK 600-10000 MG-UNT/5ML ~~LOC~~ SOLN
600.0000 mg | Freq: Once | SUBCUTANEOUS | Status: AC
  Administered 2020-11-29: 600 mg via SUBCUTANEOUS
  Filled 2020-11-29: qty 5

## 2020-11-29 MED ORDER — DIPHENHYDRAMINE HCL 25 MG PO CAPS: 25.0000 mg | ORAL_CAPSULE | Freq: Once | ORAL | Status: DC

## 2020-11-29 MED ORDER — ACETAMINOPHEN 325 MG PO TABS: 650.0000 mg | ORAL_TABLET | Freq: Once | ORAL | Status: DC

## 2020-11-29 NOTE — Progress Notes (Signed)
Pt d/c stable at 1420

## 2020-11-29 NOTE — Patient Instructions (Signed)
Trastuzumab; Hyaluronidase injection What is this medicine? TRASTUZUMAB; HYALURONIDASE (tras TOO zoo mab / hye al ur ON i dase) is used to treat breast cancer and stomach cancer. Trastuzumab is a monoclonal antibody. Hyaluronidase is used to improve the effects of trastuzumab. This medicine may be used for other purposes; ask your health care provider or pharmacist if you have questions. COMMON BRAND NAME(S): HERCEPTIN HYLECTA What should I tell my health care provider before I take this medicine? They need to know if you have any of these conditions:  heart disease  heart failure  lung or breathing disease, like asthma  an unusual or allergic reaction to trastuzumab, or other medications, foods, dyes, or preservatives  pregnant or trying to get pregnant  breast-feeding How should I use this medicine? This medicine is for injection under the skin. It is given by a health care professional in a hospital or clinic setting. Talk to your pediatrician regarding the use of this medicine in children. This medicine is not approved for use in children. Overdosage: If you think you have taken too much of this medicine contact a poison control center or emergency room at once. NOTE: This medicine is only for you. Do not share this medicine with others. What if I miss a dose? It is important not to miss a dose. Call your doctor or health care professional if you are unable to keep an appointment. What may interact with this medicine? This medicine may interact with the following medications:  certain types of chemotherapy, such as daunorubicin, doxorubicin, epirubicin, and idarubicin This list may not describe all possible interactions. Give your health care provider a list of all the medicines, herbs, non-prescription drugs, or dietary supplements you use. Also tell them if you smoke, drink alcohol, or use illegal drugs. Some items may interact with your medicine. What should I watch for while  using this medicine? Visit your doctor for checks on your progress. Report any side effects. Continue your course of treatment even though you feel ill unless your doctor tells you to stop. Call your doctor or health care professional for advice if you get a fever, chills or sore throat, or other symptoms of a cold or flu. Do not treat yourself. Try to avoid being around people who are sick. You may experience fever, chills and shaking during your first infusion. These effects are usually mild and can be treated with other medicines. Report any side effects during the infusion to your health care professional. Fever and chills usually do not happen with later infusions. Do not become pregnant while taking this medicine or for 7 months after stopping it. Women should inform their doctor if they wish to become pregnant or think they might be pregnant. Women of child-bearing potential will need to have a negative pregnancy test before starting this medicine. There is a potential for serious side effects to an unborn child. Talk to your health care professional or pharmacist for more information. Do not breast-feed an infant while taking this medicine or for 7 months after stopping it. What side effects may I notice from receiving this medicine? Side effects that you should report to your doctor or health care professional as soon as possible:  allergic reactions like skin rash, itching or hives, swelling of the face, lips, or tongue  breathing problems  chest pain or palpitations  cough  fever  general ill feeling or flu-like symptoms  signs of worsening heart failure like breathing problems; swelling in your legs and  feet Side effects that usually do not require medical attention (report these to your doctor or health care professional if they continue or are bothersome):  bone pain  changes in taste  diarrhea  joint pain  nausea/vomiting  unusually weak or tired  weight loss This  list may not describe all possible side effects. Call your doctor for medical advice about side effects. You may report side effects to FDA at 1-800-FDA-1088. Where should I keep my medicine? This drug is given in a hospital or clinic and will not be stored at home. NOTE: This sheet is a summary. It may not cover all possible information. If you have questions about this medicine, talk to your doctor, pharmacist, or health care provider.  2021 Elsevier/Gold Standard (2017-09-17 21:54:17)  

## 2020-12-02 ENCOUNTER — Other Ambulatory Visit: Payer: Self-pay | Admitting: Hematology and Oncology

## 2020-12-02 DIAGNOSIS — M2550 Pain in unspecified joint: Secondary | ICD-10-CM

## 2020-12-02 DIAGNOSIS — M791 Myalgia, unspecified site: Secondary | ICD-10-CM

## 2020-12-03 ENCOUNTER — Encounter: Payer: Self-pay | Admitting: Oncology

## 2020-12-17 ENCOUNTER — Other Ambulatory Visit: Payer: Medicare Other

## 2020-12-18 ENCOUNTER — Other Ambulatory Visit: Payer: Self-pay | Admitting: Hematology and Oncology

## 2020-12-18 ENCOUNTER — Ambulatory Visit: Payer: Medicare Other | Admitting: Hematology and Oncology

## 2020-12-18 ENCOUNTER — Inpatient Hospital Stay: Payer: Medicare Other | Attending: Oncology

## 2020-12-18 ENCOUNTER — Other Ambulatory Visit: Payer: Self-pay

## 2020-12-18 ENCOUNTER — Inpatient Hospital Stay (INDEPENDENT_AMBULATORY_CARE_PROVIDER_SITE_OTHER): Payer: Medicare Other | Admitting: Hematology and Oncology

## 2020-12-18 ENCOUNTER — Encounter: Payer: Self-pay | Admitting: Oncology

## 2020-12-18 ENCOUNTER — Encounter: Payer: Self-pay | Admitting: Hematology and Oncology

## 2020-12-18 VITALS — BP 191/86 | HR 73 | Temp 98.2°F | Resp 18 | Ht 65.5 in | Wt 174.6 lb

## 2020-12-18 DIAGNOSIS — Z79811 Long term (current) use of aromatase inhibitors: Secondary | ICD-10-CM | POA: Diagnosis not present

## 2020-12-18 DIAGNOSIS — Z7982 Long term (current) use of aspirin: Secondary | ICD-10-CM | POA: Insufficient documentation

## 2020-12-18 DIAGNOSIS — Z170 Estrogen receptor positive status [ER+]: Secondary | ICD-10-CM | POA: Diagnosis not present

## 2020-12-18 DIAGNOSIS — Z5112 Encounter for antineoplastic immunotherapy: Secondary | ICD-10-CM | POA: Diagnosis present

## 2020-12-18 DIAGNOSIS — C50912 Malignant neoplasm of unspecified site of left female breast: Secondary | ICD-10-CM | POA: Diagnosis present

## 2020-12-18 DIAGNOSIS — C50212 Malignant neoplasm of upper-inner quadrant of left female breast: Secondary | ICD-10-CM | POA: Diagnosis not present

## 2020-12-18 DIAGNOSIS — Z79899 Other long term (current) drug therapy: Secondary | ICD-10-CM | POA: Insufficient documentation

## 2020-12-18 DIAGNOSIS — Z23 Encounter for immunization: Secondary | ICD-10-CM | POA: Diagnosis not present

## 2020-12-18 LAB — HEPATIC FUNCTION PANEL
ALT: 24 (ref 7–35)
AST: 35 (ref 13–35)
Alkaline Phosphatase: 161 — AB (ref 25–125)
Bilirubin, Total: 0.4

## 2020-12-18 LAB — CEA
CA 27-29: 246.6
CEA: 8.5

## 2020-12-18 LAB — CBC AND DIFFERENTIAL
HCT: 40 (ref 36–46)
Hemoglobin: 13.1 (ref 12.0–16.0)
Neutrophils Absolute: 3.44
Platelets: 320 (ref 150–399)
WBC: 8.4

## 2020-12-18 LAB — BASIC METABOLIC PANEL
BUN: 19 (ref 4–21)
CO2: 29 — AB (ref 13–22)
Chloride: 98 — AB (ref 99–108)
Creatinine: 0.7 (ref 0.5–1.1)
Glucose: 101
Potassium: 3.9 (ref 3.4–5.3)
Sodium: 130 — AB (ref 137–147)

## 2020-12-18 LAB — COMPREHENSIVE METABOLIC PANEL
Albumin: 3.9 (ref 3.5–5.0)
Calcium: 10 (ref 8.7–10.7)

## 2020-12-18 LAB — CBC: RBC: 4.31 (ref 3.87–5.11)

## 2020-12-18 MED ORDER — TRAMADOL HCL 50 MG PO TABS: 50.0000 mg | ORAL_TABLET | Freq: Four times a day (QID) | ORAL | 0 refills | Status: DC | PRN

## 2020-12-18 NOTE — Progress Notes (Signed)
[facility]  [address] [place],  [place]  [address] [phone]  Clinic Day:  12/19/2020  Referring physician: Raina Mina., MD   CHIEF COMPLAINT:  CC: An 87 year old female with history of recurrent breast cancer here for 3 week evaluation.  Current Treatment:  Trastuzumab every 3 weeks, Aldontrate , anastrozole 1 mg daily  HISTORY OF PRESENT ILLNESS:  Amy Kline is a 87 y.o. female who presents for a transfer of care and continued management and evaluation of recurrent breast cancer.  She was originally diagnosed with a stage IA left breast cancer in May 2013 when biopsy confirmed invasive ductal carcinoma, grade 2.  Estrogen and progesterone receptors were positive at 100% and 96%, and HER2 was negative.  She was treated with left mastectomy in June and repeat HER2 was positive by [facility].  She followed with Dr. Lurline Del, and was started on tamoxifene 20 mg daily in July 2013, but this was discontinued in July 2017 due to concerns regarding endometrial hyperplasia.  She completed one year of therapy with trastuzumab in September 2014.  She did have some cardiotoxicity with this therapy, and so this had to be held this for 5-6 weeks.  However, in the summer of 2021 she started having trouble walking with generalized lower extremity weakness and lower back pain.  She also was having episodes of syncope.  MRI brain from August was negative, but MRI lumbar spine from December revealed STIR hyperintense foci at T12-L1 and S1.  MRI pelvis showed similar areas, and in addition, numerous enhancing marrow replacing lesions in the pelvis.  Bone marrow biopsy from January 2022 showed no evidence of carcinoma and a normocellular marrow.  CT chest, abdomen and pelvis showed increased left axillary and subpectoral lymphadenopathy but no other evidence of metastatic disease.  She underwent left axillary lymph node biopsy on February 18th which confirmed  triple positive carcinoma involving fatty tissue.  She was subsequently placed on anastrozole which she started on Febuary 28th, and denosumab was initiated on March 3rd.  Trastuzumab was recommended, and initiated on March 18th.  ECHO from September 2021 revealed an ejection fraction between 60-65%.  Tumor markers from January revealed a CA 27-29 of 87, a CEA of 6.65 and CA 19-9 was normal.  Recent CA 27-29 from March is significantly elevated at 221, and CEA was 10.12.  Staging PET imaging from March confirmed metastatic breast cancer as evidenced by hypermetabolic left subpectoral/left axillary lymph nodes and widespread hypermetabolic osseous lesions.  There is mild hypermetabolism within an nonenlarged subcarinal lymph node and both hilar regions.  She did receive Xgeva on March 3rd, but did not tolerate this and so it has been discontinued.  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  She is status post splenectomy, and has had both Prevnar 13 and pneumovax within the last few years.  She will be due for her next pneumovax in 2024.  She will need to get her meningococcal vaccines and hemophilus influenzae.   Bone density scan from March revealed osteoporosis with a T-score of -3.3 of the forearm, and right femur neck measures -2.0, considered osteopenic.  INTERVAL HISTORY:  Amy Kline is here for evaluation prior to a 4th cycle of trastuzumab. She is tolerating these fairly well. She has been well since her last visit with occasional generalized achiness and weakness. She has been holding her alendronate due to a dental procedure. She  denies fever, chills, nausea or vomiting. She denies shortness of breath, chest pain or cough. She denies issue with bowel or bladder. Her tumor markers are starting to decrease and CBC and CMP are unremarkable today. REVIEW OF SYSTEMS:  Review of Systems  Constitutional:  Negative for appetite change, chills, diaphoresis, fatigue,  fever and unexpected weight change.  HENT:   Negative for hearing loss, lump/mass, mouth sores, nosebleeds, sore throat, tinnitus, trouble swallowing and voice change.   Eyes:  Negative for eye problems and icterus.  Respiratory:  Negative for chest tightness, cough, hemoptysis, shortness of breath and wheezing.   Cardiovascular:  Negative for chest pain, leg swelling and palpitations.  Gastrointestinal:  Negative for abdominal distention, abdominal pain, blood in stool, constipation, diarrhea, nausea, rectal pain and vomiting.  Endocrine: Negative for hot flashes.  Genitourinary:  Negative for bladder incontinence, difficulty urinating, dyspareunia, dysuria, frequency, hematuria and nocturia.   Musculoskeletal:  Positive for arthralgias and myalgias. Negative for back pain, flank pain, gait problem, neck pain and neck stiffness.  Skin:  Negative for itching, rash and wound.  Neurological:  Negative for dizziness, extremity weakness, gait problem, headaches, light-headedness, numbness, seizures and speech difficulty.  Hematological:  Negative for adenopathy. Does not bruise/bleed easily.  Psychiatric/Behavioral:  Negative for confusion, decreased concentration, depression, sleep disturbance and suicidal ideas. The patient is nervous/anxious.     VITALS:  Blood pressure (!) 191/86, pulse 73, temperature 98.2 F (36.8 C), temperature source Oral, resp. rate 18, height 5' 5.5" (1.664 m), weight 174 lb 9.6 oz (79.2 kg), SpO2 96 %.  Wt Readings from Last 3 Encounters:  12/18/20 174 lb 9.6 oz (79.2 kg)  11/29/20 173 lb 1.3 oz (78.5 kg)  11/27/20 173 lb 12.8 oz (78.8 kg)    Body mass index is 28.61 kg/m.  Performance status (ECOG): 1 - Symptomatic but completely ambulatory  PHYSICAL EXAM:  Physical Exam Constitutional:      General: She is not in acute distress.    Appearance: Normal appearance. She is normal weight. She is not ill-appearing, toxic-appearing or diaphoretic.  HENT:     Head:  Normocephalic and atraumatic.     Nose: Nose normal. No congestion or rhinorrhea.     Mouth/Throat:     Mouth: Mucous membranes are moist.     Pharynx: Oropharynx is clear. No oropharyngeal exudate or posterior oropharyngeal erythema.  Eyes:     General: No scleral icterus.       Right eye: No discharge.        Left eye: No discharge.     Extraocular Movements: Extraocular movements intact.     Conjunctiva/sclera: Conjunctivae normal.     Pupils: Pupils are equal, round, and reactive to light.  Neck:     Vascular: No carotid bruit.  Cardiovascular:     Rate and Rhythm: Normal rate and regular rhythm.     Heart sounds: No murmur heard.   No friction rub. No gallop.  Pulmonary:     Effort: Pulmonary effort is normal. No respiratory distress.     Breath sounds: Normal breath sounds. No stridor. No wheezing, rhonchi or rales.  Chest:     Chest wall: No tenderness.  Abdominal:     General: Abdomen is flat. Bowel sounds are normal. There is no distension.     Palpations: There is no mass.     Tenderness: There is no abdominal tenderness. There is no right CVA tenderness, left CVA tenderness, guarding or rebound.     Hernia:  No hernia is present.  Musculoskeletal:        General: No swelling, tenderness, deformity or signs of injury. Normal range of motion.     Cervical back: Normal range of motion and neck supple. No rigidity or tenderness.     Right lower leg: No edema.     Left lower leg: No edema.  Lymphadenopathy:     Cervical: No cervical adenopathy.  Skin:    General: Skin is warm and dry.     Capillary Refill: Capillary refill takes less than 2 seconds.     Coloration: Skin is not jaundiced or pale.     Findings: No bruising, erythema, lesion or rash.  Neurological:     General: No focal deficit present.     Mental Status: She is alert and oriented to person, place, and time. Mental status is at baseline.     Cranial Nerves: No cranial nerve deficit.     Sensory: No  sensory deficit.     Motor: No weakness.     Coordination: Coordination normal.     Gait: Gait normal.     Deep Tendon Reflexes: Reflexes normal.  Psychiatric:        Mood and Affect: Mood normal.        Behavior: Behavior normal.        Thought Content: Thought content normal.        Judgment: Judgment normal.    LABS:   CBC Latest Ref Rng & Units 12/18/2020 11/27/2020 11/06/2020  WBC - 8.4 7.4 8.6  Hemoglobin 12.0 - 16.0 13.1 13.1 13.4  Hematocrit 36 - 46 40 40 40  Platelets 150 - 399 320 290 304   CMP Latest Ref Rng & Units 12/18/2020 11/27/2020 11/06/2020  Glucose 70 - 99 mg/dL - - -  BUN 4 - '21 19 17 21  ' Creatinine 0.5 - 1.1 0.7 0.7 0.7  Sodium 137 - 147 130(A) 130(A) 133(A)  Potassium 3.4 - 5.3 3.9 3.6 4.0  Chloride 99 - 108 98(A) 100 101  CO2 13 - 22 29(A) 25(A) 27(A)  Calcium 8.7 - 10.7 10.0 9.2 9.9  Total Protein 6.5 - 8.1 g/dL - - -  Total Bilirubin 0.3 - 1.2 mg/dL - - -  Alkaline Phos 25 - 125 161(A) 164(A) 177(A)  AST 13 - 35 35 35 31  ALT 7 - 35 '24 20 21     ' Lab Results  Component Value Date   CEA1 8.5 11/27/2020   /  CEA  Date Value Ref Range Status  11/27/2020 8.5  Final    Lab Results  Component Value Date   CAN125 23.7 07/22/2020     STUDIES:    HISTORY:   Allergies:  Allergies  Allergen Reactions   Neosporin [Neomycin-Polymyxin-Gramicidin] Rash   Sulfa Antibiotics Hives    itching   Bacitracin-Polymyxin B    Cefdinir Nausea And Vomiting   Denosumab     Pain in jaw/head/mouth numb/weak   Other Other (See Comments)    Polysporin causes a rash and swelling Unknown   Statins     Current Medications: Current Outpatient Medications  Medication Sig Dispense Refill   traMADol (ULTRAM) 50 MG tablet Take 1 tablet (50 mg total) by mouth every 6 (six) hours as needed. 60 tablet 0   alendronate (FOSAMAX) 70 MG tablet Take 1 tablet (70 mg total) by mouth once a week. Take with a full glass of water on an empty stomach. 12 tablet 3   anastrozole  (  ARIMIDEX) 1 MG tablet Take 1 tablet (1 mg total) by mouth daily. 90 tablet 4   aspirin 81 MG tablet Take 81 mg by mouth daily.     carvedilol (COREG) 12.5 MG tablet Take 0.5 tablets (6.25 mg total) by mouth 2 (two) times daily with a meal. 90 tablet 3   cyanocobalamin 1000 MCG tablet Take 1,000 mcg by mouth daily.     ergocalciferol (VITAMIN D2) 1.25 MG (50000 UT) capsule Vitamin D2 1,250 mcg (50,000 unit) capsule  TAKE 1 CAPSULE BY MOUTH ONCE A WEEK SAME DAY EACH WEEK     ibuprofen (ADVIL) 600 MG tablet TAKE 1 TABLET(600 MG) BY MOUTH EVERY 6 HOURS AS NEEDED 90 tablet 1   lactose free nutrition (BOOST) LIQD Take 237 mLs by mouth as needed. Drinks as needed for no appetite     Multiple Vitamins-Minerals (PRESERVISION AREDS 2 PO) Take 1 tablet by mouth in the morning and at bedtime.     omeprazole (PRILOSEC) 20 MG capsule Take 20 mg by mouth at bedtime.      Polyethylene Glycol 3350 (MIRALAX PO) Take by mouth daily.     pyridostigmine (MESTINON) 60 MG tablet Take 0.5 tablets (30 mg total) by mouth 3 (three) times daily. 45 tablet 5   SAVELLA 50 MG TABS tablet Take 50 mg by mouth 2 (two) times daily.     spironolactone (ALDACTONE) 25 MG tablet spironolactone 25 mg tablet     telmisartan-hydrochlorothiazide (MICARDIS HCT) 40-12.5 MG tablet TAKE ONE-HALF TABLET BY  MOUTH DAILY . DISCARD  UNUSED 1/2 TABLET 90 tablet 3   No current facility-administered medications for this visit.     ASSESSMENT & PLAN:   Assessment:   1.  Stage IA triple positive left breast cancer, diagnosed in May 2013.  She was treated with left mastectomy in June.  She was started on tamoxifene 20 mg daily in July 2013, but this was discontinued in July 2017 due to concerns regarding endometrial hyperplasia.  She completed one year of therapy with trastuzumab in September 2014.    2.  Recurrent breast cancer, February 2022, with evidence of left axillary and regional lymph nodes as well as bone lesions.  She was subsequently  placed on anastrozole which she started on Febuary 28th.  Trastuzumab every 3 weeks was recommended and started on March 18th.  She is tolerating fairly well.    3.  Significant elevation of the CA 27-29 most recent baseline measuring 221 in March and 288 in early April. She is starting to have a decrease with most recent 246 this month.  4.  Lower extremity weakness, being evaluated by neurology.  She is currently on Mestinon 0.5 mg TID.  5.  Osteoporosis of the forearm.  She received denosumab on March 3rd, but did not tolerate this therapy due to jaw pain, weakness and fatigue, and so it was discontinued.  We decided to place her on alendronate 70 mg weekly; however, this is on hold until she has some dental work later this month. Dental work is completed and we will plan to start with next cycle.  6.  Status post splenectomy.  She has started vaccines and tolerating well.   7.  Hyponatremia, mild.  I advised to increase her dietary salt as well.   Plan: She will proceed with a 5th cycle of trastuzumab. She knows to continue anastrozole daily.  We will plan to see her back in 3 weeks with CBC and CMP prior to her 6th  trastuzumab.  She will hold off on calcium until we check her again in 3 weeks as she is nervous to start since PCP asked her to stop.   Both she and her daughter verbalize understanding of and agreement to the plans discussed today. They know to call the office should any new questions or concerns arise.     Melodye Ped, NP [facility] AT [facility] [address] [place] [address] Dept: [phone] Dept Fax: [phone]

## 2020-12-19 ENCOUNTER — Encounter: Payer: Self-pay | Admitting: Hematology and Oncology

## 2020-12-19 ENCOUNTER — Encounter: Payer: Self-pay | Admitting: Oncology

## 2020-12-19 ENCOUNTER — Other Ambulatory Visit: Payer: Self-pay | Admitting: Pharmacist

## 2020-12-19 DIAGNOSIS — T451X5S Adverse effect of antineoplastic and immunosuppressive drugs, sequela: Secondary | ICD-10-CM

## 2020-12-19 DIAGNOSIS — C50212 Malignant neoplasm of upper-inner quadrant of left female breast: Secondary | ICD-10-CM

## 2020-12-19 NOTE — Progress Notes (Unsigned)
Pt has traditional MCR primary/AARP MCR supplement secondary; no PA required for CPT 93306 ECHO

## 2020-12-20 ENCOUNTER — Other Ambulatory Visit: Payer: Self-pay

## 2020-12-20 ENCOUNTER — Inpatient Hospital Stay: Payer: Medicare Other

## 2020-12-20 VITALS — BP 165/78 | HR 73 | Temp 99.1°F | Resp 18 | Ht 65.5 in | Wt 172.8 lb

## 2020-12-20 DIAGNOSIS — C7951 Secondary malignant neoplasm of bone: Secondary | ICD-10-CM

## 2020-12-20 DIAGNOSIS — Z5112 Encounter for antineoplastic immunotherapy: Secondary | ICD-10-CM | POA: Diagnosis not present

## 2020-12-20 DIAGNOSIS — C50212 Malignant neoplasm of upper-inner quadrant of left female breast: Secondary | ICD-10-CM

## 2020-12-20 LAB — CANCER ANTIGEN 27.29: CA 27.29: 238.3 U/mL — ABNORMAL HIGH (ref 0.0–38.6)

## 2020-12-20 LAB — CEA: CEA: 9 ng/mL — ABNORMAL HIGH (ref 0.0–4.7)

## 2020-12-20 MED ORDER — ACETAMINOPHEN 325 MG PO TABS: 650.0000 mg | ORAL_TABLET | Freq: Once | ORAL | Status: DC

## 2020-12-20 MED ORDER — DIPHENHYDRAMINE HCL 25 MG PO CAPS: 25.0000 mg | ORAL_CAPSULE | Freq: Once | ORAL | Status: DC

## 2020-12-20 MED ORDER — TRASTUZUMAB-HYALURONIDASE-OYSK 600-10000 MG-UNT/5ML ~~LOC~~ SOLN
600.0000 mg | Freq: Once | SUBCUTANEOUS | Status: AC
  Administered 2020-12-20: 600 mg via SUBCUTANEOUS
  Filled 2020-12-20: qty 5

## 2020-12-20 NOTE — Patient Instructions (Signed)
[facility]  Discharge Instructions: Thank you for choosing [facility] to provide your oncology and hematology care.  If you have a lab appointment with the Cancer Center, please go directly to the Cancer Center and check in at the registration area.   Wear comfortable clothing and clothing appropriate for easy access to any Portacath or PICC line.   We strive to give you quality time with your provider. You may need to reschedule your appointment if you arrive late (15 or more minutes).  Arriving late affects you and other patients whose appointments are after yours.  Also, if you miss three or more appointments without notifying the office, you may be dismissed from the clinic at the provider's discretion.      For prescription refill requests, have your pharmacy contact our office and allow 72 hours for refills to be completed.    Today you received the following chemotherapy and/or immunotherapy agents Trastuzumab     To help prevent nausea and vomiting after your treatment, we encourage you to take your nausea medication as directed.  BELOW ARE SYMPTOMS THAT SHOULD BE REPORTED IMMEDIATELY: *FEVER GREATER THAN 100.4 F (38 C) OR HIGHER *CHILLS OR SWEATING *NAUSEA AND VOMITING THAT IS NOT CONTROLLED WITH YOUR NAUSEA MEDICATION *UNUSUAL SHORTNESS OF BREATH *UNUSUAL BRUISING OR BLEEDING *URINARY PROBLEMS (pain or burning when urinating, or frequent urination) *BOWEL PROBLEMS (unusual diarrhea, constipation, pain near the anus) TENDERNESS IN MOUTH AND THROAT WITH OR WITHOUT PRESENCE OF ULCERS (sore throat, sores in mouth, or a toothache) UNUSUAL RASH, SWELLING OR PAIN  UNUSUAL VAGINAL DISCHARGE OR ITCHING   Items with * indicate a potential emergency and should be followed up as soon as possible or go to the Emergency Department if any problems should occur.  Please show the CHEMOTHERAPY ALERT CARD or IMMUNOTHERAPY ALERT CARD at check-in to the  Emergency Department and triage nurse.  Should you have questions after your visit or need to cancel or reschedule your appointment, please contact [facility] AT   Dept: [phone]  and follow the prompts.  Office hours are 8:00 a.m. to 4:30 p.m. Monday - Friday. Please note that voicemails left after 4:00 p.m. may not be returned until the following business day.  We are closed weekends and major holidays. You have access to a nurse at all times for urgent questions. Please call the main number to the clinic Dept: [phone] and follow the prompts.  For any non-urgent questions, you may also contact your provider using MyChart. We now offer e-Visits for anyone 18 and older to request care online for non-urgent symptoms. For details visit [email or web address].   Also download the MyChart app! Go to the app store, search "MyChart", open the app, select [facility], and log in with your MyChart username and password.  Due to Covid, a mask is required upon entering the hospital/clinic. If you do not have a mask, one will be given to you upon arrival. For doctor visits, patients may have 1 support person aged 18 or older with them. For treatment visits, patients cannot have anyone with them due to current Covid guidelines and our immunocompromised population.    

## 2020-12-20 NOTE — Progress Notes (Signed)
1334: PT STABLE AT TIME OF DISCHARGE  

## 2020-12-24 ENCOUNTER — Other Ambulatory Visit: Payer: Self-pay | Admitting: Pharmacist

## 2020-12-30 ENCOUNTER — Inpatient Hospital Stay: Payer: Medicare Other

## 2020-12-30 ENCOUNTER — Other Ambulatory Visit: Payer: Self-pay

## 2020-12-30 ENCOUNTER — Encounter: Payer: Self-pay | Admitting: Oncology

## 2020-12-30 VITALS — BP 160/71 | HR 72 | Temp 98.0°F | Resp 18 | Ht 65.5 in | Wt 175.2 lb

## 2020-12-30 DIAGNOSIS — Z9081 Acquired absence of spleen: Secondary | ICD-10-CM

## 2020-12-30 DIAGNOSIS — Z5112 Encounter for antineoplastic immunotherapy: Secondary | ICD-10-CM | POA: Diagnosis not present

## 2020-12-30 DIAGNOSIS — C50212 Malignant neoplasm of upper-inner quadrant of left female breast: Secondary | ICD-10-CM

## 2020-12-30 MED ORDER — MENINGOCOCCAL VAC B (OMV) IM SUSY
0.5000 mL | PREFILLED_SYRINGE | Freq: Once | INTRAMUSCULAR | Status: AC
  Administered 2020-12-30: 0.5 mL via INTRAMUSCULAR
  Filled 2020-12-30: qty 0.5

## 2020-12-30 MED ORDER — MENINGOCOCCAL A C Y&W-135 OLIG IM SOLR
0.5000 mL | Freq: Once | INTRAMUSCULAR | Status: AC
  Administered 2020-12-30: 0.5 mL via INTRAMUSCULAR
  Filled 2020-12-30: qty 0.5

## 2020-12-30 NOTE — Patient Instructions (Signed)
Meningococcal B Vaccine: What You Need to Know 1. Why get vaccinated? Meningococcal B vaccine can help protect against meningococcal disease caused by serogroup B. A different meningococcal vaccine is available that canhelp protect against serogroups A, C, W, and Y. Meningococcal disease can cause meningitis (infection of the lining of the brain and spinal cord) and infections of the blood. Even when it is treated, meningococcal disease kills 10 to 15 infected people out of 100. And of those who survive, about 10 to 20 out of every 100 will suffer disabilities such as hearing loss, brain damage, kidney damage, loss of limbs, nervous system problems, or severe scarsfrom skin grafts. Meningococcal disease is rare and has declined in the [place] since the 1990s. However, it is a severe disease with a significant risk of death orlasting disabilities in people who get it. Anyone can get meningococcal disease. Certain people are at increased risk, including: Infants younger than one year old Adolescents and young adults 67 through 85 years old People with certain medical conditions that affect the immune system Microbiologists who routinely work with isolates of N. meningitidis, the bacteria that cause meningococcal disease People at risk because of an outbreak in their community 2. Meningococcal B vaccine For best protection, more than 1 dose of a meningococcal B vaccine is needed. There are two meningococcal B vaccines available. The same vaccine must be usedfor all doses. Meningococcal B vaccines are recommended for people 10 years or older who are at increased risk for serogroup B meningococcal disease, including: People at risk because of a serogroup B meningococcal disease outbreak Anyone whose spleen is damaged or has been removed, including people with sickle cell disease Anyone with a rare immune system condition called "complement component deficiency" Anyone taking a type of drug called a  "complement inhibitor," such as eculizumab (also called "Soliris") or ravulizumab (also called "Ultomiris") Microbiologists who routinely work with isolates of N. meningitidis These vaccines may also be given to anyone 70 through 85 years old to provide short-term protection against most strains of serogroup B meningococcal disease, based on discussions between the patient and health care provider. Thepreferred age for vaccination is 64 through 18 years. 3. Talk with your health care provider Tell your vaccination provider if the person getting the vaccine: Has had an allergic reaction after a previous dose of meningococcal B vaccine, or has any severe, life-threatening allergies Is pregnant or breastfeeding In some cases, your health care provider may decide to postpone meningococcal Bvaccination until a future visit. Meningococcal B vaccination should be postponed for pregnant people unless the person is at increased risk and, after consultation with their health care provider, the benefits of vaccination are considered to outweigh the potentialrisks. People with minor illnesses, such as a cold, may be vaccinated. People who are moderately or severely ill should usually wait until they recover beforegetting meningococcal B vaccine. Your health care provider can give you more information. 4. Risks of a vaccine reaction Soreness, redness, or swelling where the shot is given, tiredness, headache, muscle or joint pain, fever, or nausea can happen after meningococcal B vaccination. Some of these reactions occur in more than half of the people who receive the vaccine. People sometimes faint after medical procedures, including vaccination. Tellyour provider if you feel dizzy or have vision changes or ringing in the ears. As with any medicine, there is a very remote chance of a vaccine causing asevere allergic reaction, other serious injury, or death. 5. What if there is a serious  problem? An allergic  reaction could occur after the vaccinated person leaves the clinic. If you see signs of a severe allergic reaction (hives, swelling of the face and throat, difficulty breathing, a fast heartbeat, dizziness, or weakness), call 9-1-1 and get the person to the nearest hospital. For other signs that concern you, call your health care provider. Adverse reactions should be reported to the Vaccine Adverse Event Reporting System (VAERS). Your health care provider will usually file this report, or you can do it yourself. Visit the VAERS website at www.vaers.SamedayNews.es or call 269-004-0908. VAERS is only for reporting reactions, and VAERS staff members do not give medical advice. 6. The National Vaccine Injury Compensation Program The [employer] Vaccine Injury Compensation Program (VICP) is a federal program that was created to compensate people who may have been injured by certain vaccines. Claims regarding alleged injury or death due to vaccination have a time limit for filing, which may be as short as two years. Visit the VICP website at GoldCloset.com.ee or call 920-572-8065 to learn about the program and about filing a claim. 7. How can I learn more? Ask your health care provider. Call your local or state health department. Visit the website of the Food and Drug Administration (FDA) for vaccine package inserts and additional information at TraderRating.uy. Contact the Centers for Disease Control and Prevention (CDC): Call 321-738-1986 (1-800-CDC-INFO) or Visit CDC's website http://hunter.com/. Vaccine Information Statement Meningococcal B Vaccine (02/16/2020) This information is not intended to replace advice given to you by your health care provider. Make sure you discuss any questions you have with your healthcare provider. Document Revised: 03/25/2020 Document Reviewed: 03/25/2020 Elsevier Patient Education  [place]. Meningococcal ACWY Vaccine:  What You Need to Know 1. Why get vaccinated? Meningococcal ACWY vaccine can help protect against meningococcal disease caused by serogroups A, C, W, and Y. A different meningococcal vaccine isavailable that can help protect against serogroup B. Meningococcal disease can cause meningitis (infection of the lining of the brain and spinal cord) and infections of the blood. Even when it is treated, meningococcal disease kills 10 to 15 infected people out of 100. And of those who survive, about 10 to 20 out of every 100 will suffer disabilities such as hearing loss, brain damage, kidney damage, loss of limbs, nervous system problems, or severe scarsfrom skin grafts. Meningococcal disease is rare and has declined in the [place] since the 1990s. However, it is a severe disease with a significant risk of death orlasting disabilities in people who get it. Anyone can get meningococcal disease. Certain people are at increased risk, including: Infants younger than one year old Adolescents and young adults 60 through 85 years old People with certain medical conditions that affect the immune system Microbiologists who routinely work with isolates of N. meningitidis, the bacteria that cause meningococcal disease People at risk because of an outbreak in their community 2. Meningococcal ACWY vaccine Adolescents need 2 doses of a meningococcal ACWY vaccine: First dose: 70 or 85 year of age Second (booster) dose: 85 years of age In addition to routine vaccination for adolescents, meningococcal ACWY vaccine is also recommended for certain groups of people: People at risk because of a serogroup A, C, W, or Y meningococcal disease outbreak People with HIV Anyone whose spleen is damaged or has been removed, including people with sickle cell disease Anyone with a rare immune system condition called "complement component deficiency" Anyone taking a type of drug called a "complement inhibitor," such as eculizumab  (also  called "Soliris") or ravulizumab (also called "Ultomiris") Microbiologists who routinely work with isolates of N. meningitidis Anyone traveling to or living in a part of the world where meningococcal disease is common, such as parts of [place] freshmen living in residence halls who have not been completely vaccinated with meningococcal ACWY vaccine [employer] 3. Talk with your health care provider Tell your vaccination provider if the person getting the vaccine: Has had an allergic reaction after a previous dose of meningococcal ACWY vaccine, or has any severe, life-threatening allergies In some cases, your health care provider may decide to postpone meningococcalACWY vaccination until a future visit. There is limited information on the risks of this vaccine for pregnant or breastfeeding people, but no safety concerns have been identified. A pregnantor breastfeeding person should be vaccinated if indicated. People with minor illnesses, such as a cold, may be vaccinated. People who are moderately or severely ill should usually wait until they recover beforegetting meningococcal ACWY vaccine. Your health care provider can give you more information. 4. Risks of a vaccine reaction Redness or soreness where the shot is given can happen after meningococcal ACWY vaccination. A small percentage of people who receive meningococcal ACWY vaccine experience muscle pain, headache, or tiredness. People sometimes faint after medical procedures, including vaccination. Tellyour provider if you feel dizzy or have vision changes or ringing in the ears. As with any medicine, there is a very remote chance of a vaccine causing asevere allergic reaction, other serious injury, or death. 5. What if there is a serious problem? An allergic reaction could occur after the vaccinated person leaves the clinic. If you see signs of a severe allergic reaction (hives, swelling of the face and throat,  difficulty breathing, a fast heartbeat, dizziness, or weakness), call 9-1-1 and get the person to the nearest hospital. For other signs that concern you, call your health care provider. Adverse reactions should be reported to the Vaccine Adverse Event Reporting System (VAERS). Your health care provider will usually file this report, or you can do it yourself. Visit the VAERS website at www.vaers.SamedayNews.es or call 434-459-5376.VAERS is only for reporting reactions, and VAERS staff members do not give medical advice. 6. The National Vaccine Injury Compensation Program The [employer] Vaccine Injury Compensation Program (VICP) is a federal program that was created to compensate people who may have been injured by certain vaccines. Claims regarding alleged injury or death due to vaccination have a time limit for filing, which may be as short as two years. Visit the VICP website at GoldCloset.com.ee or call (717)195-1309 to learn about the program and about filing a claim. 7. How can I learn more? Ask your health care provider. Call your local or state health department. Visit the website of the Food and Drug Administration (FDA) for vaccine package inserts and additional information at TraderRating.uy. Contact the Centers for Disease Control and Prevention (CDC): Call 249-045-5439 (1-800-CDC-INFO) or Visit CDC's website at http://hunter.com/. Vaccine Information Statement Meningococcal ACWY Vaccine (02/16/2020) This information is not intended to replace advice given to you by your health care provider. Make sure you discuss any questions you have with your healthcare provider. Document Revised: 04/01/2020 Document Reviewed: 04/01/2020 Elsevier Patient Education  [place]. Meningococcal Group B Vaccine (4 strain) suspension for injection What is this medication? MENINGOCOCCAL GROUP B VACCINE, RECOMBINANT (muh ning goh KOK kal vak SEEN) is a vaccine  to protect from bacterial meningitis. This vaccine does not containlive bacteria. It will not cause a meningitis. This medicine  may be used for other purposes; ask your health care provider orpharmacist if you have questions. COMMON BRAND NAME(S): TRUMENBA What should I tell my care team before I take this medication? They need to know if you have any of these conditions: bleeding disorder fever or infection immune system problems an unusual or allergic reaction to meningococcal vaccine, other medicines, foods, dyes, or preservatives pregnant or trying to get pregnant breast-feeding How should I use this medication? This medicine is for injection into a muscle. It is given by a health careprofessional in a hospital or clinic setting. A copy of Vaccine Information Statements will be given before each vaccination.Read this sheet carefully each time. The sheet may change frequently. Talk to your pediatrician regarding the use of this medicine in children. While this drug may be prescribed for children as young as 3 years of age forselected conditions, precautions do apply. Overdosage: If you think you have taken too much of this medicine contact apoison control center or emergency room at once. NOTE: This medicine is only for you. Do not share this medicine with others. What if I miss a dose? It is important not to miss your dose. Call your doctor or health careprofessional if you are unable to keep an appointment. What may interact with this medication? certain medicines that treat or prevent blood clots medicines that lower your chance of fighting infection other vaccines This list may not describe all possible interactions. Give your health care provider a list of all the medicines, herbs, non-prescription drugs, or dietary supplements you use. Also tell them if you smoke, drink alcohol, or use illegaldrugs. Some items may interact with your medicine. What should I watch for while using this  medication? Report any side effects that are worrisome to your doctor right away. This vaccine may not protect from all meningitis infections. What side effects may I notice from receiving this medication? Side effects that you should report to your doctor or health care professionalas soon as possible: allergic reactions like skin rash, itching or hives, swelling of the face, lips, or tongue breathing problems Side effects that usually do not require medical attention (report to yourdoctor or health care professional if they continue or are bothersome): chills diarrhea fever headache joint pain muscle pain pain, redness, or irritation at site where injected This list may not describe all possible side effects. Call your doctor for medical advice about side effects. You may report side effects to FDA at1-800-FDA-1088. Where should I keep my medication? This vaccine is given in a hospital or clinic and will not be stored at home. NOTE: This sheet is a summary. It may not cover all possible information. If you have questions about this medicine, talk to your doctor, pharmacist, orhealth care provider.  2022 Elsevier/Gold Standard (2018-05-16 10:25:45)

## 2021-01-02 ENCOUNTER — Telehealth: Payer: Self-pay

## 2021-01-02 NOTE — Telephone Encounter (Addendum)
01/07/21 @ 1511- I spoke with pt. She states that the bone pain & achiness has lasted until present. It is better than it was at first per pt. I told pt that the pharmacist and NP don't think she will experience this pain with each bone injection weekly. They feel it was from the meningitis injections.    @1719 - I called pt's dtr, [name], and gave her the same information below. She was appreciative of return call as well.   I spoke with pt and notified of her below. Pt states she took some ultram and it helped. She asked if she was going to feel like this every time she took the weekly bone shot? I told her I would ask the pharmacist and get back in touch with her tomorrow. Pt thanked me for calling her.  ---- Message from Melodye Ped, NP sent at 01/02/2021  4:50 PM EDT ----- Regarding: RE: Aching all over, in bed all day Contact: [phone] Definitely fatigue, muscle/joint pain and even fever or chills are all side effects of the vaccine.   ----- Message ----- From: [name], RN Sent: 01/02/2021   4:47 PM EDT To: Juanetta Beets, RPH, Melodye Ped, NP Subject: Aching all over, in bed all day                Pt's dtr, [name], called req to speak with pharmacist. She states her mom got 2 meninginitis shots this week and she has been achy all over, and today hasn't even gotten out of bed. She wants to know if this is a side effect of the injections?  She also mentioned that she started her bone medication this week too.

## 2021-01-06 ENCOUNTER — Telehealth: Payer: Self-pay | Admitting: Cardiology

## 2021-01-06 MED ORDER — TELMISARTAN-HCTZ 40-12.5 MG PO TABS: ORAL_TABLET | ORAL | 0 refills | Status: DC

## 2021-01-06 NOTE — Telephone Encounter (Signed)
Spoke to the patient just now and sent in a 2 week supply to a local pharmacy for her. I advised her that I could send in these prescriptions separately so that she could be taking one full tablet of each one since she does not like cutting them in half. She states that she does not want to do that and will continue to take the one that she currently is on.    Encouraged patient to call back with any questions or concerns.

## 2021-01-06 NOTE — Telephone Encounter (Signed)
  Pt c/o medication issue:  1. Name of Medication: telmisartan-hydrochlorothiazide (MICARDIS HCT) 40-12.5 MG tablet  2. How are you currently taking this medication (dosage and times per day)? As directed  3. Are you having a reaction (difficulty breathing--STAT)?  NA  4. What is your medication issue? Patient will not get her refill from mail order until 7/2-7/5 and she only has 3 pills left. Husband is calling to ask if she can get an emergency supply. He also has a question about possibly getting a different dosage to save money.

## 2021-01-07 ENCOUNTER — Other Ambulatory Visit: Payer: Self-pay | Admitting: Hematology and Oncology

## 2021-01-07 ENCOUNTER — Telehealth: Payer: Self-pay

## 2021-01-07 ENCOUNTER — Encounter: Payer: Self-pay | Admitting: Oncology

## 2021-01-07 DIAGNOSIS — C50212 Malignant neoplasm of upper-inner quadrant of left female breast: Secondary | ICD-10-CM

## 2021-01-07 NOTE — Telephone Encounter (Signed)
@  1511- I spoke with pt. She reports foul smelling urine, & aching in her lower abdomen. I asked her what her urine looked like. She replied, "It's dark". Her family reports she had some confusion over the weekend. Afebrile. Pt has appt here tomorrow with the NP. I will send this message to her, as she will most likely order a urine specimen. Pt encouraged to increase her fluid intake, as her urine is dark. Also, told her to drink plenty of fluids before she comes in for labs, & f/u tomorrow. Pt verbalized understanding.    @ 1228- Pt's daughter,[name], states she thinks her "mom may have a UTI. She is really sick. Will you call her"?

## 2021-01-08 ENCOUNTER — Inpatient Hospital Stay (INDEPENDENT_AMBULATORY_CARE_PROVIDER_SITE_OTHER): Payer: Medicare Other | Admitting: Hematology and Oncology

## 2021-01-08 ENCOUNTER — Other Ambulatory Visit: Payer: Self-pay | Admitting: Hematology and Oncology

## 2021-01-08 ENCOUNTER — Telehealth: Payer: Self-pay | Admitting: Hematology and Oncology

## 2021-01-08 ENCOUNTER — Inpatient Hospital Stay: Payer: Medicare Other

## 2021-01-08 ENCOUNTER — Ambulatory Visit: Payer: Medicare Other

## 2021-01-08 VITALS — BP 175/80 | HR 69 | Temp 98.2°F | Resp 16 | Ht 65.5 in

## 2021-01-08 DIAGNOSIS — Z5112 Encounter for antineoplastic immunotherapy: Secondary | ICD-10-CM | POA: Diagnosis not present

## 2021-01-08 DIAGNOSIS — C50212 Malignant neoplasm of upper-inner quadrant of left female breast: Secondary | ICD-10-CM | POA: Diagnosis not present

## 2021-01-08 LAB — HEPATIC FUNCTION PANEL
ALT: 24 (ref 7–35)
AST: 34 (ref 13–35)
Alkaline Phosphatase: 171 — AB (ref 25–125)
Bilirubin, Total: 0.3

## 2021-01-08 LAB — BASIC METABOLIC PANEL
BUN: 19 (ref 4–21)
CO2: 29 — AB (ref 13–22)
Chloride: 99 (ref 99–108)
Creatinine: 0.7 (ref 0.5–1.1)
Glucose: 100
Potassium: 3.8 (ref 3.4–5.3)
Sodium: 135 — AB (ref 137–147)

## 2021-01-08 LAB — CBC AND DIFFERENTIAL
HCT: 40 (ref 36–46)
Hemoglobin: 13.9 (ref 12.0–16.0)
Neutrophils Absolute: 4.05
Platelets: 318 (ref 150–399)
WBC: 8.8

## 2021-01-08 LAB — CBC: RBC: 4.32 (ref 3.87–5.11)

## 2021-01-08 LAB — COMPREHENSIVE METABOLIC PANEL
Albumin: 3.9 (ref 3.5–5.0)
Calcium: 10.4 (ref 8.7–10.7)

## 2021-01-08 MED ORDER — CYANOCOBALAMIN 1000 MCG PO TABS: 1000.0000 ug | ORAL_TABLET | Freq: Every day | ORAL | 3 refills | Status: DC

## 2021-01-08 MED ORDER — ERGOCALCIFEROL 1.25 MG (50000 UT) PO CAPS: ORAL_CAPSULE | ORAL | 3 refills | Status: DC

## 2021-01-08 NOTE — Progress Notes (Signed)
[facility]  [address] [place],  [place]  [address] [phone]  Clinic Day:  01/15/2021  Referring physician: Raina Mina., MD   CHIEF COMPLAINT:  CC: An 87 year old female with history of recurrent breast cancer here for 3 week evaluation.  Current Treatment:  Trastuzumab every 3 weeks, Aldontrate , anastrozole 1 mg daily  HISTORY OF PRESENT ILLNESS:  Amy Kline is a 87 y.o. female who presents for a transfer of care and continued management and evaluation of recurrent breast cancer.  She was originally diagnosed with a stage IA left breast cancer in May 2013 when biopsy confirmed invasive ductal carcinoma, grade 2.  Estrogen and progesterone receptors were positive at 100% and 96%, and HER2 was negative.  She was treated with left mastectomy in June and repeat HER2 was positive by [facility].  She followed with Dr. Lurline Del, and was started on tamoxifene 20 mg daily in July 2013, but this was discontinued in July 2017 due to concerns regarding endometrial hyperplasia.  She completed one year of therapy with trastuzumab in September 2014.  She did have some cardiotoxicity with this therapy, and so this had to be held this for 5-6 weeks.  However, in the summer of 2021 she started having trouble walking with generalized lower extremity weakness and lower back pain.  She also was having episodes of syncope.  MRI brain from August was negative, but MRI lumbar spine from December revealed STIR hyperintense foci at T12-L1 and S1.  MRI pelvis showed similar areas, and in addition, numerous enhancing marrow replacing lesions in the pelvis.  Bone marrow biopsy from January 2022 showed no evidence of carcinoma and a normocellular marrow.  CT chest, abdomen and pelvis showed increased left axillary and subpectoral lymphadenopathy but no other evidence of metastatic disease.  She underwent left axillary lymph node biopsy on February 18th which confirmed  triple positive carcinoma involving fatty tissue.  She was subsequently placed on anastrozole which she started on Febuary 28th, and denosumab was initiated on March 3rd.  Trastuzumab was recommended, and initiated on March 18th.  ECHO from September 2021 revealed an ejection fraction between 60-65%.  Tumor markers from January revealed a CA 27-29 of 87, a CEA of 6.65 and CA 19-9 was normal.  Recent CA 27-29 from March is significantly elevated at 221, and CEA was 10.12.  Staging PET imaging from March confirmed metastatic breast cancer as evidenced by hypermetabolic left subpectoral/left axillary lymph nodes and widespread hypermetabolic osseous lesions.  There is mild hypermetabolism within an nonenlarged subcarinal lymph node and both hilar regions.  She did receive Xgeva on March 3rd, but did not tolerate this and so it has been discontinued.  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  She is status post splenectomy, and has had both Prevnar 13 and pneumovax within the last few years.  She will be due for her next pneumovax in 2024.  She will need to get her meningococcal vaccines and hemophilus influenzae.   Bone density scan from March revealed osteoporosis with a T-score of -3.3 of the forearm, and right femur neck measures -2.0, considered osteopenic.  INTERVAL HISTORY:  Amy Kline is here for evaluation prior to a 6th cycle of trastuzumab. She is tolerating these fairly well. Her daughter is with her today and states that over this past weekend, the patient had an episode of memory loss, confusion, possible dehydration. She states  her mother has not been wearing her CPAP as normal and had not been drinking as much that day and the family was concerned that she may have been dehydrated and that's what was causing the dehydration. They did try to encourage fluid intake that night and were close to calling an ambulance to transport to ED. Today the patient appears to  be close to herself. She reports fatigue and generalized achiness for which she does not like to take pain medicine. She did have her meningitis vaccine one week ago and experienced severe soreness which is resolving. She denies fever, chills, nausea or vomiting. She denies shortness of breath, chest pain or cough. She is not having symptoms of UTI, but this was a concern due to her confusion, so we did check a urine that was negative. She is having regular bowel movements. She does continue to have anxiety which is increasing since the episode over the weekend. Today her CBC and CMP are unremarkable. Her blood pressure has been elevated at home and she has increased her lasix to help lower that.    Review of Systems  Constitutional:  Negative for appetite change, chills, diaphoresis, fatigue, fever and unexpected weight change.  HENT:   Negative for hearing loss, lump/mass, mouth sores, nosebleeds, sore throat, tinnitus, trouble swallowing and voice change.   Eyes:  Negative for eye problems and icterus.  Respiratory:  Negative for chest tightness, cough, hemoptysis, shortness of breath and wheezing.   Cardiovascular:  Negative for chest pain, leg swelling and palpitations.  Gastrointestinal:  Negative for abdominal distention, abdominal pain, blood in stool, constipation, diarrhea, nausea, rectal pain and vomiting.  Endocrine: Negative for hot flashes.  Genitourinary:  Negative for bladder incontinence, difficulty urinating, dyspareunia, dysuria, frequency, hematuria and nocturia.   Musculoskeletal:  Positive for arthralgias and myalgias. Negative for back pain, flank pain, gait problem, neck pain and neck stiffness.  Skin:  Negative for itching, rash and wound.  Neurological:  Negative for dizziness, extremity weakness, gait problem, headaches, light-headedness, numbness, seizures and speech difficulty.  Hematological:  Negative for adenopathy. Does not bruise/bleed easily.   Psychiatric/Behavioral:  Negative for confusion, decreased concentration, depression, sleep disturbance and suicidal ideas. The patient is nervous/anxious.     VITALS:  Blood pressure (!) 175/80, pulse 69, temperature 98.2 F (36.8 C), resp. rate 16, height 5' 5.5" (1.664 m), SpO2 97 %.  Wt Readings from Last 3 Encounters:  01/10/21 175 lb 4 oz (79.5 kg)  12/30/20 175 lb 4 oz (79.5 kg)  12/20/20 172 lb 12 oz (78.4 kg)    Body mass index is 28.72 kg/m.  Performance status (ECOG): 1 - Symptomatic but completely ambulatory  PHYSICAL EXAM:  Physical Exam Constitutional:      General: She is not in acute distress.    Appearance: Normal appearance. She is normal weight. She is not ill-appearing, toxic-appearing or diaphoretic.  HENT:     Head: Normocephalic and atraumatic.     Nose: Nose normal. No congestion or rhinorrhea.     Mouth/Throat:     Mouth: Mucous membranes are moist.     Pharynx: Oropharynx is clear. No oropharyngeal exudate or posterior oropharyngeal erythema.  Eyes:     General: No scleral icterus.       Right eye: No discharge.        Left eye: No discharge.     Extraocular Movements: Extraocular movements intact.     Conjunctiva/sclera: Conjunctivae normal.     Pupils: Pupils are equal, round,  and reactive to light.  Neck:     Vascular: No carotid bruit.  Cardiovascular:     Rate and Rhythm: Normal rate and regular rhythm.     Heart sounds: No murmur heard.   No friction rub. No gallop.  Pulmonary:     Effort: Pulmonary effort is normal. No respiratory distress.     Breath sounds: Normal breath sounds. No stridor. No wheezing, rhonchi or rales.  Chest:     Chest wall: No tenderness.  Abdominal:     General: Abdomen is flat. Bowel sounds are normal. There is no distension.     Palpations: There is no mass.     Tenderness: There is no abdominal tenderness. There is no right CVA tenderness, left CVA tenderness, guarding or rebound.     Hernia: No hernia is  present.  Musculoskeletal:        General: No swelling, tenderness, deformity or signs of injury. Normal range of motion.     Cervical back: Normal range of motion and neck supple. No rigidity or tenderness.     Right lower leg: No edema.     Left lower leg: No edema.  Lymphadenopathy:     Cervical: No cervical adenopathy.  Skin:    General: Skin is warm and dry.     Capillary Refill: Capillary refill takes less than 2 seconds.     Coloration: Skin is not jaundiced or pale.     Findings: No bruising, erythema, lesion or rash.  Neurological:     General: No focal deficit present.     Mental Status: She is alert and oriented to person, place, and time. Mental status is at baseline.     Cranial Nerves: No cranial nerve deficit.     Sensory: No sensory deficit.     Motor: No weakness.     Coordination: Coordination normal.     Gait: Gait normal.     Deep Tendon Reflexes: Reflexes normal.  Psychiatric:        Mood and Affect: Mood normal.        Behavior: Behavior normal.        Thought Content: Thought content normal.        Judgment: Judgment normal.    LABS:   CBC Latest Ref Rng & Units 01/08/2021 12/18/2020 11/27/2020  WBC - 8.8 8.4 7.4  Hemoglobin 12.0 - 16.0 13.9 13.1 13.1  Hematocrit 36 - 46 40 40 40  Platelets 150 - 399 318 320 290   CMP Latest Ref Rng & Units 01/08/2021 12/18/2020 11/27/2020  Glucose 70 - 99 mg/dL - - -  BUN 4 - _0 Creatinine 0.5 - 1.1 0.7 0.7 0.7  Sodium 137 - 147 135(A) 130(A) 130(A)  Potassium 3.4 - 5.3 3.8 3.9 3.6  Chloride 99 - 108 99 98(A) 100  CO2 13 - 22 29(A) 29(A) 25(A)  Calcium 8.7 - 10.7 10.4 10.0 9.2  Total Protein 6.5 - 8.1 g/dL - - -  Total Bilirubin 0.3 - 1.2 mg/dL - - -  Alkaline Phos 25 - 125 171(A) 161(A) 164(A)  AST 13 - 35 34 35 35  ALT 7 - 35 _1 Lab Results  Component Value Date   CEA1 8.2 (H) 01/08/2021   /  CEA  Date Value Ref Range Status  01/08/2021 8.2 (H) 0.0 - 4.7 ng/mL Final    Comment:     (NOTE)  Nonsmokers          <3.9                             Smokers             <5.6 [employer] Electrochemiluminescence Immunoassay (ECLIA) Values obtained with different assay methods or kits cannot be used interchangeably.  Results cannot be interpreted as absolute evidence of the presence or absence of malignant disease. Performed At: [facility] [place], [place] [id number] Rush Farmer MD HA:[id number]     Lab Results  Component Value Date   BDZ329 23.7 07/22/2020     STUDIES:    HISTORY:   Allergies:  Allergies  Allergen Reactions   Neosporin [Neomycin-Polymyxin-Gramicidin] Rash   Sulfa Antibiotics Hives    itching   Bacitracin-Polymyxin B    Cefdinir Nausea And Vomiting   Denosumab     Pain in jaw/head/mouth numb/weak   Other Other (See Comments)    Polysporin causes a rash and swelling Unknown   Statins     Current Medications: Current Outpatient Medications  Medication Sig Dispense Refill   alendronate (FOSAMAX) 70 MG tablet Take 1 tablet (70 mg total) by mouth once a week. Take with a full glass of water on an empty stomach. 12 tablet 3   anastrozole (ARIMIDEX) 1 MG tablet Take 1 tablet (1 mg total) by mouth daily. 90 tablet 4   aspirin 81 MG tablet Take 81 mg by mouth daily.     carvedilol (COREG) 12.5 MG tablet Take 0.5 tablets (6.25 mg total) by mouth 2 (two) times daily with a meal. 90 tablet 3   cyanocobalamin 1000 MCG tablet Take 1 tablet (1,000 mcg total) by mouth daily. 90 tablet 3   ergocalciferol (VITAMIN D2) 1.25 MG (50000 UT) capsule Vitamin D2 1,250 mcg (50,000 unit) capsule  TAKE 1 CAPSULE BY MOUTH ONCE A WEEK SAME DAY EACH WEEK 12 capsule 3   ibuprofen (ADVIL) 600 MG tablet TAKE 1 TABLET(600 MG) BY MOUTH EVERY 6 HOURS AS NEEDED 90 tablet 1   lactose free nutrition (BOOST) LIQD Take 237 mLs by mouth as needed. Drinks as needed for no appetite     Multiple Vitamins-Minerals  (PRESERVISION AREDS 2 PO) Take 1 tablet by mouth in the morning and at bedtime.     omeprazole (PRILOSEC) 20 MG capsule Take 20 mg by mouth at bedtime.      Polyethylene Glycol 3350 (MIRALAX PO) Take by mouth daily.     pyridostigmine (MESTINON) 60 MG tablet Take 0.5 tablets (30 mg total) by mouth 3 (three) times daily. 45 tablet 5   SAVELLA 50 MG TABS tablet Take 50 mg by mouth 2 (two) times daily.     spironolactone (ALDACTONE) 25 MG tablet spironolactone 25 mg tablet     telmisartan-hydrochlorothiazide (MICARDIS HCT) 40-12.5 MG tablet TAKE ONE-HALF TABLET BY  MOUTH DAILY . DISCARD  UNUSED 1/2 TABLET 7 tablet 0   traMADol (ULTRAM) 50 MG tablet Take 1 tablet (50 mg total) by mouth every 6 (six) hours as needed. 60 tablet 0   No current facility-administered medications for this visit.     ASSESSMENT & PLAN:   Assessment:   1.  Stage IA triple positive left breast cancer, diagnosed in May 2013.  She was treated with left mastectomy in June.  She was started on tamoxifene 20 mg daily in July 2013, but this was discontinued in July  2017 due to concerns regarding endometrial hyperplasia.  She completed one year of therapy with trastuzumab in September 2014.    2.  Recurrent breast cancer, February 2022, with evidence of left axillary and regional lymph nodes as well as bone lesions.  She was subsequently placed on anastrozole which she started on Febuary 28th.  Trastuzumab every 3 weeks was recommended and started on March 18th.  She is tolerating fairly well.    3.  Significant elevation of the CA 27-29 most recent baseline measuring 221 in March and 288 in early April. She is starting to have a decrease with most recent 233 this month.  4.  Lower extremity weakness, being evaluated by neurology.  She is currently on Mestinon 0.5 mg TID.  5.  Osteoporosis of the forearm.  She received denosumab on March 3rd, but did not tolerate this therapy due to jaw pain, weakness and fatigue, and so it was  discontinued.  We decided to place her on alendronate 70 mg weekly; however, this is on hold until she has some dental work later this month. Dental work is completed and she is now on alendronate weekly and tolerating well.   6.  Status post splenectomy.  She has started vaccines and tolerating well.   7.  Hyponatremia, mild.  I advised to increase her dietary salt as well.   Plan: She will proceed with a 6th cycle of trastuzumab. She knows to continue anastrozole daily.  We will plan to see her back in 3 weeks with CBC and CMP prior to her 6th trastuzumab. I have asked her to reach out to her cardiologist to help manage her hypertension as I do not think adding lasix is the best method.   Both she and her daughter verbalize understanding of and agreement to the plans discussed today. They know to call the office should any new questions or concerns arise.     Melodye Ped, NP [facility] AT [facility] [address] [place] [address] Dept: [phone] Dept Fax: [phone]

## 2021-01-08 NOTE — Telephone Encounter (Signed)
Per 6/29 LOS, patient scheduled for July Appt's.  Gave patient Appt Calendar 

## 2021-01-09 DIAGNOSIS — I361 Nonrheumatic tricuspid (valve) insufficiency: Secondary | ICD-10-CM | POA: Diagnosis not present

## 2021-01-10 ENCOUNTER — Other Ambulatory Visit: Payer: Self-pay

## 2021-01-10 ENCOUNTER — Inpatient Hospital Stay: Payer: Medicare Other | Attending: Oncology

## 2021-01-10 ENCOUNTER — Encounter: Payer: Self-pay | Admitting: Oncology

## 2021-01-10 VITALS — BP 150/75 | HR 70 | Temp 98.0°F | Resp 18 | Ht 65.5 in | Wt 175.2 lb

## 2021-01-10 DIAGNOSIS — E871 Hypo-osmolality and hyponatremia: Secondary | ICD-10-CM | POA: Diagnosis not present

## 2021-01-10 DIAGNOSIS — C50212 Malignant neoplasm of upper-inner quadrant of left female breast: Secondary | ICD-10-CM

## 2021-01-10 DIAGNOSIS — M810 Age-related osteoporosis without current pathological fracture: Secondary | ICD-10-CM | POA: Insufficient documentation

## 2021-01-10 DIAGNOSIS — Z9012 Acquired absence of left breast and nipple: Secondary | ICD-10-CM | POA: Diagnosis not present

## 2021-01-10 DIAGNOSIS — M6281 Muscle weakness (generalized): Secondary | ICD-10-CM | POA: Diagnosis not present

## 2021-01-10 DIAGNOSIS — Z5112 Encounter for antineoplastic immunotherapy: Secondary | ICD-10-CM | POA: Insufficient documentation

## 2021-01-10 DIAGNOSIS — Z170 Estrogen receptor positive status [ER+]: Secondary | ICD-10-CM | POA: Insufficient documentation

## 2021-01-10 DIAGNOSIS — C7951 Secondary malignant neoplasm of bone: Secondary | ICD-10-CM

## 2021-01-10 DIAGNOSIS — Z79899 Other long term (current) drug therapy: Secondary | ICD-10-CM | POA: Insufficient documentation

## 2021-01-10 DIAGNOSIS — Z79810 Long term (current) use of selective estrogen receptor modulators (SERMs): Secondary | ICD-10-CM | POA: Diagnosis not present

## 2021-01-10 DIAGNOSIS — C50912 Malignant neoplasm of unspecified site of left female breast: Secondary | ICD-10-CM | POA: Insufficient documentation

## 2021-01-10 LAB — CEA: CEA: 8.2 ng/mL — ABNORMAL HIGH (ref 0.0–4.7)

## 2021-01-10 LAB — CANCER ANTIGEN 27.29: CA 27.29: 233.1 U/mL — ABNORMAL HIGH (ref 0.0–38.6)

## 2021-01-10 MED ORDER — DIPHENHYDRAMINE HCL 25 MG PO CAPS: 25.0000 mg | ORAL_CAPSULE | Freq: Once | ORAL | Status: DC

## 2021-01-10 MED ORDER — ACETAMINOPHEN 325 MG PO TABS: 650.0000 mg | ORAL_TABLET | Freq: Once | ORAL | Status: DC

## 2021-01-10 MED ORDER — TRASTUZUMAB-HYALURONIDASE-OYSK 600-10000 MG-UNT/5ML ~~LOC~~ SOLN
600.0000 mg | Freq: Once | SUBCUTANEOUS | Status: AC
  Administered 2021-01-10: 600 mg via SUBCUTANEOUS
  Filled 2021-01-10: qty 5

## 2021-01-10 NOTE — Patient Instructions (Signed)
[facility]  Discharge Instructions: Thank you for choosing [facility] to provide your oncology and hematology care.  If you have a lab appointment with the [facility], please go directly to the [facility] and check in at the registration area.   Wear comfortable clothing and clothing appropriate for easy access to any Portacath or PICC line.   We strive to give you quality time with your provider. You may need to reschedule your appointment if you arrive late (15 or more minutes).  Arriving late affects you and other patients whose appointments are after yours.  Also, if you miss three or more appointments without notifying the office, you may be dismissed from the clinic at the provider's discretion.      For prescription refill requests, have your pharmacy contact our office and allow 72 hours for refills to be completed.    Today you received the following chemotherapy and/or immunotherapy agents Trastuzumab subcutaneous      To help prevent nausea and vomiting after your treatment, we encourage you to take your nausea medication as directed.  BELOW ARE SYMPTOMS THAT SHOULD BE REPORTED IMMEDIATELY: *FEVER GREATER THAN 100.4 F (38 C) OR HIGHER *CHILLS OR SWEATING *NAUSEA AND VOMITING THAT IS NOT CONTROLLED WITH YOUR NAUSEA MEDICATION *UNUSUAL SHORTNESS OF BREATH *UNUSUAL BRUISING OR BLEEDING *URINARY PROBLEMS (pain or burning when urinating, or frequent urination) *BOWEL PROBLEMS (unusual diarrhea, constipation, pain near the anus) TENDERNESS IN MOUTH AND THROAT WITH OR WITHOUT PRESENCE OF ULCERS (sore throat, sores in mouth, or a toothache) UNUSUAL RASH, SWELLING OR PAIN  UNUSUAL VAGINAL DISCHARGE OR ITCHING   Items with * indicate a potential emergency and should be followed up as soon as possible or go to the Emergency Department if any problems should occur.  Please show the CHEMOTHERAPY ALERT CARD or IMMUNOTHERAPY ALERT CARD at  check-in to the Emergency Department and triage nurse.  Should you have questions after your visit or need to cancel or reschedule your appointment, please contact [facility]  Dept: [phone]  and follow the prompts.  Office hours are 8:00 a.m. to 4:30 p.m. Monday - Friday. Please note that voicemails left after 4:00 p.m. may not be returned until the following business day.  We are closed weekends and major holidays. You have access to a nurse at all times for urgent questions. Please call the main number to the clinic Dept: [phone] and follow the prompts.  For any non-urgent questions, you may also contact your provider using MyChart. We now offer e-Visits for anyone 2 and older to request care online for non-urgent symptoms. For details visit [email or web address].   Also download the MyChart app! Go to the app store, search "MyChart", open the app, select [facility], and log in with your MyChart username and password.  Due to Covid, a mask is required upon entering the hospital/clinic. If you do not have a mask, one will be given to you upon arrival. For doctor visits, patients may have 1 support person aged 57 or older with them. For treatment visits, patients cannot have anyone with them due to current Covid guidelines and our immunocompromised population.

## 2021-01-10 NOTE — Telephone Encounter (Signed)
Results

## 2021-01-15 ENCOUNTER — Encounter: Payer: Self-pay | Admitting: Oncology

## 2021-01-15 ENCOUNTER — Encounter: Payer: Self-pay | Admitting: Hematology and Oncology

## 2021-01-20 ENCOUNTER — Other Ambulatory Visit: Payer: Self-pay | Admitting: Cardiology

## 2021-01-22 ENCOUNTER — Other Ambulatory Visit: Payer: Self-pay | Admitting: Cardiology

## 2021-01-22 ENCOUNTER — Telehealth: Payer: Self-pay | Admitting: Neurology

## 2021-01-22 NOTE — Telephone Encounter (Signed)
Called spoke w/ daughter. Offered appt tomorrow at 930am w/ Dr. Felecia Shelling. She is unable, has to watch grandson, cannot bring mother to appt.  I placed on hold. Got permission from Dr. Felecia Shelling to schedule appt on 01/27/21. Scheduled appt for 200p, check in 1:30p.

## 2021-01-22 NOTE — Telephone Encounter (Signed)
Pt's daughter called wanting mother to get in sooner. There has been a decline in her mobility. Please advise.

## 2021-01-23 NOTE — Progress Notes (Signed)
[facility]  [address] [place],  [place]  [address] [phone]  Clinic Day:  01/28/2021  Referring physician: Raina Mina., MD  This document serves as a record of services personally performed by Hosie Poisson, MD. It was created on their behalf by [facility] E, a trained medical scribe. The creation of this record is based on the scribe's personal observations and the provider's statements to them.  CHIEF COMPLAINT:  CC: History of recurrent breast cancer   Current Treatment:  Trastuzumab every 3 weeks, Aledronate , anastrozole 1 mg daily  HISTORY OF PRESENT ILLNESS:  Amy Kline is a 87 y.o. female who presents for a transfer of care and continued management and evaluation of recurrent breast cancer.  She was originally diagnosed with a stage IA left breast cancer in May 2013 when biopsy confirmed invasive ductal carcinoma, grade 2.  Estrogen and progesterone receptors were positive at 100% and 96%, and HER2 was negative.  She was treated with left mastectomy in June and repeat HER2 was positive by [facility].  She followed with Dr. Lurline Del, and was started on tamoxifene 20 mg daily in July 2013, but this was discontinued in July 2017 due to concerns regarding endometrial hyperplasia.  She completed one year of therapy with trastuzumab in September 2014.  She did have some cardiotoxicity with this therapy, and so this had to be held this for 5-6 weeks.  However, in the summer of 2021 she started having trouble walking with generalized lower extremity weakness and lower back pain.  She also was having episodes of syncope.  MRI brain from August was negative, but MRI lumbar spine from December revealed STIR hyperintense foci at T12-L1 and S1.  MRI pelvis showed similar areas, and in addition, numerous enhancing marrow replacing lesions in the pelvis.  Bone marrow biopsy from January 2022 showed no evidence of carcinoma and a normocellular  marrow.  CT chest, abdomen and pelvis showed increased left axillary and subpectoral lymphadenopathy but no other evidence of metastatic disease.  She underwent left axillary lymph node biopsy on February 18th which confirmed triple positive carcinoma involving fatty tissue.  She was subsequently placed on anastrozole which she started on Febuary 28th, and denosumab was initiated on March 3rd.  Trastuzumab was recommended, and initiated on March 18th.  ECHO from September 2021 revealed an ejection fraction between 60-65%.  Tumor markers from January revealed a CA 27-29 of 87, a CEA of 6.65 and CA 19-9 was normal.  Recent CA 27-29 from March is significantly elevated at 221, and CEA was 10.12.  Staging PET imaging from March confirmed metastatic breast cancer as evidenced by hypermetabolic left subpectoral/left axillary lymph nodes and widespread hypermetabolic osseous lesions.  There is mild hypermetabolism within an nonenlarged subcarinal lymph node and both hilar regions.  She did receive Xgeva on March 3rd, but did not tolerate this and so it has been discontinued.  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  She is status post splenectomy, and has had both Prevnar 13 and pneumovax within the last few years.  She will be due for her next pneumovax in 2024.  She will need to get her meningococcal vaccines and hemophilus influenzae.   Bone density scan from March revealed osteoporosis with a T-score of -3.3 of the forearm, and right femur neck measures -2.0, considered osteopenic.  She did have her meningitis vaccine and experienced severe soreness which  is resolving. ECHO from June 30th revealed an EF between 55-60%.    INTERVAL HISTORY:  Amy Kline is here for follow up prior to a 7th cycle of trastuzumab.  She states that she has been struggling with confusion and worsening mobility.  She reports brain fog and extremity weakness.  She has been following with a  neurologist, Dr. Felecia Shelling, who she saw yesterday, and he is checking paraneoplastic antibodies, SPEP, CK, and AchRAb.  She has also had episodes of incontinence recently, and has been placed on Vesicare 5 mg daily. They ask about getting some help at home so we will contact home health for evaluation.  Blood counts and chemistries are unremarkable except for a BUN of 26, and a calcium of 10.7.  She is on oral calcium supplement once daily.  Her  appetite is good, and she has lost 1 and 1/2 pounds since her last visit.  She denies fever, chills or other signs of infection.  She denies nausea, vomiting, bowel issues, or abdominal pain.  She denies sore throat, cough, dyspnea, or chest pain.  Her daughter accompanies her today.   Review of Systems  Constitutional: Negative.  Negative for appetite change, chills, fatigue, fever and unexpected weight change.  HENT:  Negative.    Eyes: Negative.   Respiratory: Negative.  Negative for chest tightness, cough, hemoptysis, shortness of breath and wheezing.   Cardiovascular: Negative.  Negative for chest pain, leg swelling and palpitations.  Gastrointestinal: Negative.  Negative for abdominal distention, abdominal pain, blood in stool, constipation, diarrhea, nausea and vomiting.  Endocrine: Positive for hot flashes (intermittent).  Genitourinary:  Positive for bladder incontinence. Negative for difficulty urinating, dysuria, frequency and hematuria.   Musculoskeletal:  Positive for gait problem. Negative for arthralgias, back pain, flank pain and myalgias.  Skin: Negative.   Neurological:  Positive for extremity weakness (in a wheelchair) and gait problem. Negative for dizziness, headaches, light-headedness, numbness, seizures and speech difficulty.       "Brain fog"  Hematological: Negative.   Psychiatric/Behavioral: Negative.  Negative for depression and sleep disturbance. The patient is not nervous/anxious.     VITALS:  Blood pressure (!) 158/72, pulse 80,  temperature 98.6 F (37 C), temperature source Oral, resp. rate 16, height 5' 5.5" (1.664 m), weight 173 lb 8 oz (78.7 kg), SpO2 96 %.  Wt Readings from Last 3 Encounters:  01/28/21 173 lb 8 oz (78.7 kg)  01/27/21 175 lb (79.4 kg)  01/10/21 175 lb 4 oz (79.5 kg)    Body mass index is 28.43 kg/m.  Performance status (ECOG): 2 - Symptomatic, <50% confined to bed  PHYSICAL EXAM:  Physical Exam Constitutional:      General: She is not in acute distress.    Appearance: Normal appearance. She is normal weight.  HENT:     Head: Normocephalic and atraumatic.  Eyes:     General: No scleral icterus.    Extraocular Movements: Extraocular movements intact.     Conjunctiva/sclera: Conjunctivae normal.     Pupils: Pupils are equal, round, and reactive to light.  Cardiovascular:     Rate and Rhythm: Normal rate and regular rhythm.     Pulses: Normal pulses.     Heart sounds: Normal heart sounds. No murmur heard.   No friction rub. No gallop.  Pulmonary:     Effort: Pulmonary effort is normal. No respiratory distress.     Breath sounds: Normal breath sounds.  Abdominal:     General: Bowel sounds are normal. There  is no distension.     Palpations: Abdomen is soft. There is no hepatomegaly, splenomegaly or mass.     Tenderness: There is no abdominal tenderness.  Musculoskeletal:        General: Normal range of motion.     Cervical back: Normal range of motion and neck supple.     Right lower leg: No edema.     Left lower leg: No edema.  Lymphadenopathy:     Cervical: No cervical adenopathy.  Skin:    General: Skin is warm and dry.  Neurological:     General: No focal deficit present.     Mental Status: She is alert and oriented to person, place, and time. Mental status is at baseline.  Psychiatric:        Mood and Affect: Mood normal.        Behavior: Behavior normal.        Thought Content: Thought content normal.        Judgment: Judgment normal.    LABS:   CBC Latest Ref Rng  & Units 01/28/2021 01/08/2021 12/18/2020  WBC - 7.4 8.8 8.4  Hemoglobin 12.0 - 16.0 13.6 13.9 13.1  Hematocrit 36 - 46 40 40 40  Platelets 150 - 399 337 318 320   CMP Latest Ref Rng & Units 01/28/2021 01/27/2021 01/08/2021  Glucose 70 - 99 mg/dL - - -  BUN 4 - 21 26(A) - 19  Creatinine 0.5 - 1.1 0.8 - 0.7  Sodium 137 - 147 136(A) - 135(A)  Potassium 3.4 - 5.3 3.6 - 3.8  Chloride 99 - 108 102 - 99  CO2 13 - 22 28(A) - 29(A)  Calcium 8.7 - 10.7 10.7 - 10.4  Total Protein - - WILL FOLLOW -  Total Bilirubin 0.3 - 1.2 mg/dL - - -  Alkaline Phos 25 - 125 124 - 171(A)  AST 13 - 35 32 - 34  ALT 7 - 35 19 - 24     Lab Results  Component Value Date   CEA1 8.2 (H) 01/08/2021   /  CEA  Date Value Ref Range Status  01/08/2021 8.2 (H) 0.0 - 4.7 ng/mL Final    Comment:    (NOTE)                             Nonsmokers          <3.9                             Smokers             <5.6 [employer] Electrochemiluminescence Immunoassay (ECLIA) Values obtained with different assay methods or kits cannot be used interchangeably.  Results cannot be interpreted as absolute evidence of the presence or absence of malignant disease. Performed At: [facility] [place], [place] [id number] Rush Farmer MD CL:[id number]     Lab Results  Component Value Date   BSW967 23.7 07/22/2020     STUDIES:    HISTORY:   Allergies:  Allergies  Allergen Reactions   Neosporin [Neomycin-Polymyxin-Gramicidin] Rash   Sulfa Antibiotics Hives    itching   Bacitracin-Polymyxin B    Cefdinir Nausea And Vomiting   Denosumab     Pain in jaw/head/mouth numb/weak   Other Other (See Comments)    Polysporin causes a rash and swelling Unknown   Statins  Current Medications: Current Outpatient Medications  Medication Sig Dispense Refill   alendronate (FOSAMAX) 70 MG tablet Take 1 tablet (70 mg total) by mouth once a week. Take with a full glass of water on an empty stomach.  12 tablet 3   anastrozole (ARIMIDEX) 1 MG tablet Take 1 tablet (1 mg total) by mouth daily. 90 tablet 4   aspirin 81 MG tablet Take 81 mg by mouth daily.     carvedilol (COREG) 12.5 MG tablet Take 0.5 tablets (6.25 mg total) by mouth 2 (two) times daily with a meal. 90 tablet 3   cyanocobalamin 1000 MCG tablet Take 1 tablet (1,000 mcg total) by mouth daily. 90 tablet 3   ergocalciferol (VITAMIN D2) 1.25 MG (50000 UT) capsule Vitamin D2 1,250 mcg (50,000 unit) capsule  TAKE 1 CAPSULE BY MOUTH ONCE A WEEK SAME DAY EACH WEEK 12 capsule 3   ibuprofen (ADVIL) 600 MG tablet TAKE 1 TABLET(600 MG) BY MOUTH EVERY 6 HOURS AS NEEDED 90 tablet 1   omeprazole (PRILOSEC) 20 MG capsule Take 20 mg by mouth at bedtime.      Polyethylene Glycol 3350 (MIRALAX PO) Take by mouth daily.     pyridostigmine (MESTINON) 60 MG tablet Take 0.5 tablets (30 mg total) by mouth 3 (three) times daily. 45 tablet 5   SAVELLA 50 MG TABS tablet Take 50 mg by mouth 2 (two) times daily.     solifenacin (VESICARE) 5 MG tablet Take 1 tablet (5 mg total) by mouth daily. 30 tablet 5   telmisartan-hydrochlorothiazide (MICARDIS HCT) 40-12.5 MG tablet Take 0.5 tablets by mouth daily. Patient needs appointment for further refills. 1 st attempt  DISCARD UNUSED 1/2 TABLET 90 tablet 0   traMADol (ULTRAM) 50 MG tablet Take 1 tablet (50 mg total) by mouth every 6 (six) hours as needed. 60 tablet 0   lactose free nutrition (BOOST) LIQD Take 237 mLs by mouth as needed. Drinks as needed for no appetite (Patient not taking: Reported on 01/28/2021)     Multiple Vitamins-Minerals (PRESERVISION AREDS 2 PO) Take 1 tablet by mouth in the morning and at bedtime. (Patient not taking: Reported on 01/28/2021)     spironolactone (ALDACTONE) 25 MG tablet spironolactone 25 mg tablet (Patient not taking: Reported on 01/28/2021)     No current facility-administered medications for this visit.     ASSESSMENT & PLAN:   Assessment:   1.  Stage IA triple positive  left breast cancer, diagnosed in May 2013.  She was treated with left mastectomy in June.  She was started on tamoxifene 20 mg daily in July 2013, but this was discontinued in July 2017 due to concerns regarding endometrial hyperplasia.  She completed one year of therapy with trastuzumab in September 2014.    2.  Recurrent breast cancer, February 2022, with evidence of left axillary and regional lymph nodes as well as bone lesions.  She was subsequently placed on anastrozole which she started on Febuary 28th.  Trastuzumab every 3 weeks was recommended and started on March 18th 2022.  She is tolerating fairly well.    3.  Significant elevation of the CA 27-29 most recent baseline measuring 221 in March and 288 in early April. She is starting to have a decrease with most recent 233 this month.  4.  Lower extremity weakness and episodes of confusion, worsening, being evaluated by neurology and felt to be neuropathy.  She is currently on Mestinon 0.5 mg TID.  They have asked about getting help  at home, and we will plan to contact home health.  5.  Osteoporosis of the forearm.  She received denosumab on March 3rd, but did not tolerate this therapy due to jaw pain, weakness and fatigue, and so it was discontinued.  We decided to place her on alendronate 70 mg weekly and tolerating this well.  We will continue the oral calcium supplement once daily, even though her level is mildly elevated, since she is on the alendronate.  6.  Status post splenectomy.  She has started vaccines and had a severe reaction with the meningitis vaccines.  We will not pursue further meningitis vaccines.   7.  Hyponatremia, mild.  I advised to increase her dietary salt as well.  8.  Hypercalcemia, she is on oral daily supplement.  9.  Reaction to the meningitis vaccines.  Since both were given on same day, we don't know which one.  Will not administer any more.  Plan: In regards to her worsening mobility, we will plan to  contact home health so that she can be evaluated for at least home PT as well as nursing.  She will continue to be followed by neurology.  She will proceed with a 7th cycle of trastuzumab.  She knows to continue anastrozole daily and alendronate 70 mg weekly.  We will plan to see her back in 3 weeks with CBC and CMP prior to her 8th trastuzumab.  I advised that she not take further meningitis vaccines due to her severe toxicities.  Both she and her daughter verbalize understanding of and agreement to the plans discussed today. They know to call the office should any new questions or concerns arise.   I provided 25 minutes of face-to-face time during this this encounter and > 50% was spent counseling as documented under my assessment and plan.    [name], MD [facility] [address] [place] [address] Dept: [phone] Dept Fax: [phone]

## 2021-01-24 ENCOUNTER — Encounter: Payer: Self-pay | Admitting: Oncology

## 2021-01-26 ENCOUNTER — Other Ambulatory Visit: Payer: Self-pay | Admitting: Oncology

## 2021-01-27 ENCOUNTER — Other Ambulatory Visit: Payer: Self-pay

## 2021-01-27 ENCOUNTER — Encounter: Payer: Self-pay | Admitting: Neurology

## 2021-01-27 ENCOUNTER — Ambulatory Visit (INDEPENDENT_AMBULATORY_CARE_PROVIDER_SITE_OTHER): Payer: Medicare Other | Admitting: Neurology

## 2021-01-27 VITALS — BP 157/85 | HR 73 | Ht 65.5 in | Wt 175.0 lb

## 2021-01-27 DIAGNOSIS — C50919 Malignant neoplasm of unspecified site of unspecified female breast: Secondary | ICD-10-CM

## 2021-01-27 DIAGNOSIS — C7951 Secondary malignant neoplasm of bone: Secondary | ICD-10-CM

## 2021-01-27 DIAGNOSIS — R32 Unspecified urinary incontinence: Secondary | ICD-10-CM

## 2021-01-27 DIAGNOSIS — G629 Polyneuropathy, unspecified: Secondary | ICD-10-CM

## 2021-01-27 DIAGNOSIS — R42 Dizziness and giddiness: Secondary | ICD-10-CM

## 2021-01-27 DIAGNOSIS — R269 Unspecified abnormalities of gait and mobility: Secondary | ICD-10-CM

## 2021-01-27 DIAGNOSIS — R55 Syncope and collapse: Secondary | ICD-10-CM

## 2021-01-27 DIAGNOSIS — R29898 Other symptoms and signs involving the musculoskeletal system: Secondary | ICD-10-CM

## 2021-01-27 DIAGNOSIS — Z8673 Personal history of transient ischemic attack (TIA), and cerebral infarction without residual deficits: Secondary | ICD-10-CM

## 2021-01-27 MED ORDER — SOLIFENACIN SUCCINATE 5 MG PO TABS: 5.0000 mg | ORAL_TABLET | Freq: Every day | ORAL | 5 refills | Status: DC
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
## 2021-01-28 ENCOUNTER — Other Ambulatory Visit: Payer: Self-pay

## 2021-01-28 ENCOUNTER — Other Ambulatory Visit: Payer: Self-pay | Admitting: Pharmacist

## 2021-01-28 ENCOUNTER — Other Ambulatory Visit: Payer: Self-pay | Admitting: Oncology

## 2021-01-28 ENCOUNTER — Inpatient Hospital Stay (INDEPENDENT_AMBULATORY_CARE_PROVIDER_SITE_OTHER): Payer: Medicare Other | Admitting: Oncology

## 2021-01-28 ENCOUNTER — Telehealth: Payer: Self-pay | Admitting: Oncology

## 2021-01-28 ENCOUNTER — Inpatient Hospital Stay: Payer: Medicare Other

## 2021-01-28 ENCOUNTER — Encounter: Payer: Self-pay | Admitting: Oncology

## 2021-01-28 VITALS — BP 158/72 | HR 80 | Temp 98.6°F | Resp 16 | Ht 65.5 in | Wt 173.5 lb

## 2021-01-28 DIAGNOSIS — C50212 Malignant neoplasm of upper-inner quadrant of left female breast: Secondary | ICD-10-CM

## 2021-01-28 DIAGNOSIS — C7951 Secondary malignant neoplasm of bone: Secondary | ICD-10-CM | POA: Diagnosis not present

## 2021-01-28 DIAGNOSIS — Z5112 Encounter for antineoplastic immunotherapy: Secondary | ICD-10-CM | POA: Diagnosis not present

## 2021-01-28 LAB — HEPATIC FUNCTION PANEL
ALT: 19 (ref 7–35)
AST: 32 (ref 13–35)
Alkaline Phosphatase: 124 (ref 25–125)
Bilirubin, Total: 0.4

## 2021-01-28 LAB — BASIC METABOLIC PANEL
BUN: 26 — AB (ref 4–21)
CO2: 28 — AB (ref 13–22)
Chloride: 102 (ref 99–108)
Creatinine: 0.8 (ref 0.5–1.1)
Glucose: 108
Potassium: 3.6 (ref 3.4–5.3)
Sodium: 136 — AB (ref 137–147)

## 2021-01-28 LAB — CBC AND DIFFERENTIAL
HCT: 40 (ref 36–46)
Hemoglobin: 13.6 (ref 12.0–16.0)
Neutrophils Absolute: 3.92
Platelets: 337 (ref 150–399)
WBC: 7.4

## 2021-01-28 LAB — CBC: RBC: 4.26 (ref 3.87–5.11)

## 2021-01-28 LAB — COMPREHENSIVE METABOLIC PANEL
Albumin: 3.8 (ref 3.5–5.0)
Calcium: 10.7 (ref 8.7–10.7)

## 2021-01-28 NOTE — Telephone Encounter (Signed)
Per 7/19 los next appt scheduled and given to patient 

## 2021-01-28 NOTE — Progress Notes (Signed)
Pt's daughter,[name], feels they need help with mobility issues in the home. I notified Dr [name] of request. Home health referral will be sent.

## 2021-01-29 ENCOUNTER — Other Ambulatory Visit: Payer: Self-pay

## 2021-01-29 DIAGNOSIS — M8588 Other specified disorders of bone density and structure, other site: Secondary | ICD-10-CM

## 2021-01-29 DIAGNOSIS — C50212 Malignant neoplasm of upper-inner quadrant of left female breast: Secondary | ICD-10-CM

## 2021-01-29 DIAGNOSIS — C7951 Secondary malignant neoplasm of bone: Secondary | ICD-10-CM

## 2021-01-29 LAB — CANCER ANTIGEN 27.29: CA 27.29: 244.6 U/mL — ABNORMAL HIGH (ref 0.0–38.6)

## 2021-01-31 ENCOUNTER — Other Ambulatory Visit: Payer: Self-pay

## 2021-01-31 ENCOUNTER — Telehealth: Payer: Self-pay

## 2021-01-31 ENCOUNTER — Inpatient Hospital Stay: Payer: Medicare Other

## 2021-01-31 ENCOUNTER — Other Ambulatory Visit: Payer: Self-pay | Admitting: Hematology and Oncology

## 2021-01-31 VITALS — BP 159/83 | HR 76 | Temp 98.2°F | Resp 16

## 2021-01-31 DIAGNOSIS — Z5112 Encounter for antineoplastic immunotherapy: Secondary | ICD-10-CM | POA: Diagnosis not present

## 2021-01-31 DIAGNOSIS — R300 Dysuria: Secondary | ICD-10-CM

## 2021-01-31 DIAGNOSIS — C50212 Malignant neoplasm of upper-inner quadrant of left female breast: Secondary | ICD-10-CM

## 2021-01-31 DIAGNOSIS — C7951 Secondary malignant neoplasm of bone: Secondary | ICD-10-CM

## 2021-01-31 MED ORDER — DIPHENHYDRAMINE HCL 25 MG PO CAPS: 25.0000 mg | ORAL_CAPSULE | Freq: Once | ORAL | Status: DC

## 2021-01-31 MED ORDER — ACETAMINOPHEN 325 MG PO TABS: 650.0000 mg | ORAL_TABLET | Freq: Once | ORAL | Status: DC

## 2021-01-31 MED ORDER — TRASTUZUMAB-HYALURONIDASE-OYSK 600-10000 MG-UNT/5ML ~~LOC~~ SOLN
600.0000 mg | Freq: Once | SUBCUTANEOUS | Status: AC
  Administered 2021-01-31: 600 mg via SUBCUTANEOUS
  Filled 2021-01-31: qty 5

## 2021-01-31 MED ORDER — BENZONATATE 100 MG PO CAPS: 100.0000 mg | ORAL_CAPSULE | Freq: Three times a day (TID) | ORAL | 2 refills | Status: DC | PRN

## 2021-01-31 MED ORDER — CIPROFLOXACIN HCL 500 MG PO TABS: 500.0000 mg | ORAL_TABLET | Freq: Two times a day (BID) | ORAL | 0 refills | Status: DC

## 2021-01-31 NOTE — Patient Instructions (Signed)
[facility]  Discharge Instructions: Thank you for choosing [facility] to provide your oncology and hematology care.  If you have a lab appointment with the [facility], please go directly to the [facility] and check in at the registration area.   Wear comfortable clothing and clothing appropriate for easy access to any Portacath or PICC line.   We strive to give you quality time with your provider. You may need to reschedule your appointment if you arrive late (15 or more minutes).  Arriving late affects you and other patients whose appointments are after yours.  Also, if you miss three or more appointments without notifying the office, you may be dismissed from the clinic at the provider's discretion.      For prescription refill requests, have your pharmacy contact our office and allow 72 hours for refills to be completed.    Today you received the following chemotherapy and/or immunotherapy agents: HerceptinTrastuzumab; Hyaluronidase injection What is this medication? TRASTUZUMAB; HYALURONIDASE (tras TOO zoo mab / hye al ur ON i dase) is used to treat breast cancer and stomach cancer. Trastuzumab is a monoclonal antibody.Hyaluronidase is used to improve the effects of trastuzumab. This medicine may be used for other purposes; ask your health care provider orpharmacist if you have questions. COMMON BRAND NAME(S): HERCEPTIN HYLECTA What should I tell my care team before I take this medication? They need to know if you have any of these conditions: heart disease heart failure lung or breathing disease, like asthma an unusual or allergic reaction to trastuzumab, or other medications, foods, dyes, or preservatives pregnant or trying to get pregnant breast-feeding How should I use this medication? This medicine is for injection under the skin. It is given by a health careprofessional in a hospital or clinic setting. Talk to your pediatrician  regarding the use of this medicine in children. Thismedicine is not approved for use in children. Overdosage: If you think you have taken too much of this medicine contact apoison control center or emergency room at once. NOTE: This medicine is only for you. Do not share this medicine with others. What if I miss a dose? It is important not to miss a dose. Call your doctor or health careprofessional if you are unable to keep an appointment. What may interact with this medication? This medicine may interact with the following medications: certain types of chemotherapy, such as daunorubicin, doxorubicin, epirubicin, and idarubicin This list may not describe all possible interactions. Give your health care provider a list of all the medicines, herbs, non-prescription drugs, or dietary supplements you use. Also tell them if you smoke, drink alcohol, or use illegaldrugs. Some items may interact with your medicine. What should I watch for while using this medication? Visit your doctor for checks on your progress. Report any side effects. Continue your course of treatment even though you feel ill unless your doctortells you to stop. Call your doctor or health care professional for advice if you get a fever, chills or sore throat, or other symptoms of a cold or flu. Do not treatyourself. Try to avoid being around people who are sick. You may experience fever, chills and shaking during your first infusion. These effects are usually mild and can be treated with other medicines. Report any side effects during the infusion to your health care professional. Fever andchills usually do not happen with later infusions. Do not become pregnant while taking this medicine or for 7 months after stopping it.  Women should inform their doctor if they wish to become pregnant or think they might be pregnant. Women of child-bearing potential will need to have a negative pregnancy test before starting this medicine. There is a  potential for serious side effects to an unborn child. Talk to your health care professional or pharmacist for more information. Do not breast-feed an infantwhile taking this medicine or for 7 months after stopping it. What side effects may I notice from receiving this medication? Side effects that you should report to your doctor or health care professionalas soon as possible: allergic reactions like skin rash, itching or hives, swelling of the face, lips, or tongue breathing problems chest pain or palpitations cough fever general ill feeling or flu-like symptoms signs of worsening heart failure like breathing problems; swelling in your legs and feet Side effects that usually do not require medical attention (report these toyour doctor or health care professional if they continue or are bothersome): bone pain changes in taste diarrhea joint pain nausea/vomiting unusually weak or tired weight loss This list may not describe all possible side effects. Call your doctor for medical advice about side effects. You may report side effects to FDA at1-800-FDA-1088. Where should I keep my medication? This drug is given in a hospital or clinic and will not be stored at home. NOTE: This sheet is a summary. It may not cover all possible information. If you have questions about this medicine, talk to your doctor, pharmacist, orhealth care provider.  2022 Elsevier/Gold Standard (2017-09-17 21:54:17)    To help prevent nausea and vomiting after your treatment, we encourage you to take your nausea medication as directed.  BELOW ARE SYMPTOMS THAT SHOULD BE REPORTED IMMEDIATELY: *FEVER GREATER THAN 100.4 F (38 C) OR HIGHER *CHILLS OR SWEATING *NAUSEA AND VOMITING THAT IS NOT CONTROLLED WITH YOUR NAUSEA MEDICATION *UNUSUAL SHORTNESS OF BREATH *UNUSUAL BRUISING OR BLEEDING *URINARY PROBLEMS (pain or burning when urinating, or frequent urination) *BOWEL PROBLEMS (unusual diarrhea, constipation, pain  near the anus) TENDERNESS IN MOUTH AND THROAT WITH OR WITHOUT PRESENCE OF ULCERS (sore throat, sores in mouth, or a toothache) UNUSUAL RASH, SWELLING OR PAIN  UNUSUAL VAGINAL DISCHARGE OR ITCHING   Items with * indicate a potential emergency and should be followed up as soon as possible or go to the Emergency Department if any problems should occur.  Please show the CHEMOTHERAPY ALERT CARD or IMMUNOTHERAPY ALERT CARD at check-in to the Emergency Department and triage nurse.  Should you have questions after your visit or need to cancel or reschedule your appointment, please contact [facility]  Dept: [phone]  and follow the prompts.  Office hours are 8:00 a.m. to 4:30 p.m. Monday - Friday. Please note that voicemails left after 4:00 p.m. may not be returned until the following business day.  We are closed weekends and major holidays. You have access to a nurse at all times for urgent questions. Please call the main number to the clinic Dept: [phone] and follow the prompts.  For any non-urgent questions, you may also contact your provider using MyChart. We now offer e-Visits for anyone 37 and older to request care online for non-urgent symptoms. For details visit [email or web address].   Also download the MyChart app! Go to the app store, search "MyChart", open the app, select [facility], and log in with your MyChart username and password.  Due to Covid, a mask is required upon entering the hospital/clinic. If you do not have a mask, one will  be given to you upon arrival. For doctor visits, patients may have 1 support person aged 61 or older with them. For treatment visits, patients cannot have anyone with them due to current Covid guidelines and our immunocompromised population.

## 2021-01-31 NOTE — Progress Notes (Signed)
Pharmacist in to speak with patient and daughter. Discharged home, stable.

## 2021-01-31 NOTE — Progress Notes (Unsigned)
Patient reported urinary symptoms at infusion today.  Urinalysis is suspicious for infection, so placed the patient on Cipro 500 mg twice daily for 10 days pending the culture.  Patient also has been having some dry cough in her Dr. [name] we will give her Ladona Ridgel.

## 2021-01-31 NOTE — Telephone Encounter (Signed)
Per Dr. [name]:  Will send in an antibiotic for UTI, pt is to keep infusion appt and give urine sample while at [facility].

## 2021-01-31 NOTE — Progress Notes (Signed)
Late entry- 1300 Urine collected and sent to lab.

## 2021-01-31 NOTE — Addendum Note (Signed)
Addended by: Morton Amy D on: 01/31/2021 02:51 PM   Modules accepted: Orders

## 2021-01-31 NOTE — Telephone Encounter (Signed)
Called daughter [name]  and notified her that the patient does have a UTI and that Cipro was called into [facility] on [address]. Patient daughter also wants to know if Vida Roller can call patient in something for cough like Tessalon Pearls (small gel cap).

## 2021-02-03 ENCOUNTER — Telehealth: Payer: Self-pay | Admitting: Neurology

## 2021-02-03 ENCOUNTER — Encounter: Payer: Self-pay | Admitting: Oncology

## 2021-02-03 NOTE — Telephone Encounter (Signed)
Called daughter back. Confirmed labs were drawn and some results are still pending. Once all back, we will call with results. She verbalized understanding.

## 2021-02-03 NOTE — Telephone Encounter (Signed)
Pt;s daughter, Alesia Morin ( on [place]) called,during her last office visit mother told us she not sure if she signed the form to her blood test done. We want to make sure her blood test was done. Would like a call from the nurse.

## 2021-02-04 ENCOUNTER — Telehealth: Payer: Medicare Other | Admitting: Physician Assistant

## 2021-02-04 ENCOUNTER — Telehealth: Payer: Self-pay

## 2021-02-04 DIAGNOSIS — U071 COVID-19: Secondary | ICD-10-CM | POA: Diagnosis not present

## 2021-02-04 MED ORDER — MOLNUPIRAVIR EUA 200MG CAPSULE: 4.0000 | ORAL_CAPSULE | Freq: Two times a day (BID) | ORAL | 0 refills | Status: AC

## 2021-02-04 MED ORDER — BENZONATATE 100 MG PO CAPS: 100.0000 mg | ORAL_CAPSULE | Freq: Three times a day (TID) | ORAL | 0 refills | Status: DC | PRN

## 2021-02-04 NOTE — Patient Instructions (Signed)
Amy Kline, thank you for joining [name], PA-C for today's virtual visit.  While this provider is not your primary care provider (PCP), if your PCP is located in our provider database this encounter information will be shared with them immediately following your visit.  Consent: (Patient) Amy Kline provided verbal consent for this virtual visit at the beginning of the encounter.  Current Medications:  Current Outpatient Medications:    benzonatate (TESSALON) 100 MG capsule, Take 1 capsule (100 mg total) by mouth 3 (three) times daily as needed for cough., Disp: 30 capsule, Rfl: 0   molnupiravir EUA 200 mg CAPS, Take 4 capsules (800 mg total) by mouth 2 (two) times daily for 5 days., Disp: 40 capsule, Rfl: 0   alendronate (FOSAMAX) 70 MG tablet, Take 1 tablet (70 mg total) by mouth once a week. Take with a full glass of water on an empty stomach., Disp: 12 tablet, Rfl: 3   anastrozole (ARIMIDEX) 1 MG tablet, Take 1 tablet (1 mg total) by mouth daily., Disp: 90 tablet, Rfl: 4   aspirin 81 MG tablet, Take 81 mg by mouth daily., Disp: , Rfl:    carvedilol (COREG) 12.5 MG tablet, Take 0.5 tablets (6.25 mg total) by mouth 2 (two) times daily with a meal., Disp: 90 tablet, Rfl: 3   ciprofloxacin (CIPRO) 500 MG tablet, Take 1 tablet (500 mg total) by mouth 2 (two) times daily., Disp: 20 tablet, Rfl: 0   cyanocobalamin 1000 MCG tablet, Take 1 tablet (1,000 mcg total) by mouth daily., Disp: 90 tablet, Rfl: 3   ergocalciferol (VITAMIN D2) 1.25 MG (50000 UT) capsule, Vitamin D2 1,250 mcg (50,000 unit) capsule  TAKE 1 CAPSULE BY MOUTH ONCE A WEEK SAME DAY EACH WEEK, Disp: 12 capsule, Rfl: 3   ibuprofen (ADVIL) 600 MG tablet, TAKE 1 TABLET(600 MG) BY MOUTH EVERY 6 HOURS AS NEEDED, Disp: 90 tablet, Rfl: 1   lactose free nutrition (BOOST) LIQD, Take 237 mLs by mouth as needed. Drinks as needed for no appetite (Patient not taking: Reported on 01/28/2021), Disp: , Rfl:    Multiple  Vitamins-Minerals (PRESERVISION AREDS 2 PO), Take 1 tablet by mouth in the morning and at bedtime. (Patient not taking: Reported on 01/28/2021), Disp: , Rfl:    omeprazole (PRILOSEC) 20 MG capsule, Take 20 mg by mouth at bedtime. , Disp: , Rfl:    Polyethylene Glycol 3350 (MIRALAX PO), Take by mouth daily., Disp: , Rfl:    pyridostigmine (MESTINON) 60 MG tablet, Take 0.5 tablets (30 mg total) by mouth 3 (three) times daily., Disp: 45 tablet, Rfl: 5   SAVELLA 50 MG TABS tablet, Take 50 mg by mouth 2 (two) times daily., Disp: , Rfl:    solifenacin (VESICARE) 5 MG tablet, Take 1 tablet (5 mg total) by mouth daily., Disp: 30 tablet, Rfl: 5   telmisartan-hydrochlorothiazide (MICARDIS HCT) 40-12.5 MG tablet, Take 0.5 tablets by mouth daily. Patient needs appointment for further refills. 1 st attempt  DISCARD UNUSED 1/2 TABLET, Disp: 90 tablet, Rfl: 0   traMADol (ULTRAM) 50 MG tablet, Take 1 tablet (50 mg total) by mouth every 6 (six) hours as needed., Disp: 60 tablet, Rfl: 0   Medications ordered in this encounter:  Meds ordered this encounter  Medications   benzonatate (TESSALON) 100 MG capsule    Sig: Take 1 capsule (100 mg total) by mouth 3 (three) times daily as needed for cough.    Dispense:  30 capsule    Refill:  0    Order Specific Question:   Supervising Provider    Answer:   Sabra Heck, BRIAN [[id number]]   molnupiravir EUA 200 mg CAPS    Sig: Take 4 capsules (800 mg total) by mouth 2 (two) times daily for 5 days.    Dispense:  40 capsule    Refill:  0    Order Specific Question:   Supervising Provider    Answer:   Sabra Heck, [place]     *If you need refills on other medications prior to your next appointment, please contact your pharmacy*  Follow-Up: Call back or seek an in-person evaluation if the symptoms worsen or if the condition fails to improve as anticipated.  Other Instructions Please keep well-hydrated and get plenty of rest. Start a saline nasal rinse to flush out your nasal  passages. You can use plain Mucinex to help thin congestion. If you have a humidifier, running in the bedroom at night. I want you to start OTC vitamin D3 1000 units daily, vitamin C 1000 mg daily, and a zinc supplement. Please take prescribed medications as directed.  You have been enrolled in a MyChart symptom monitoring program. Please answer these questions daily so we can keep track of how you are doing.  You were to quarantine for 5 days from onset of your symptoms.  After day 5, if you have had no fever and you are feeling better, you can end quarantine but need to mask for an additional 5 days. After day 5 if you have a fever or are having significant symptoms, please quarantine for full 10 days.  If you note any worsening of symptoms, any significant shortness of breath or any chest pain, please seek ER evaluation ASAP.  Please do not delay care!    If you have been instructed to have an in-person evaluation today at a local Urgent Care facility, please use the link below. It will take you to a list of all of our available [facility], including address, phone number and hours of operation. Please do not delay care.  [facility]  If you or a family member do not have a primary care provider, use the link below to schedule a visit and establish care. When you choose a [facility] primary care physician or advanced practice provider, you gain a long-term partner in health. Find a Primary Care Provider  Learn more about 's in-office and virtual care options: [facility]

## 2021-02-04 NOTE — Progress Notes (Signed)
Virtual Visit Consent   Amy Kline, you are scheduled for a virtual visit with a [facility] provider today.     Just as with appointments in the office, your consent must be obtained to participate.  Your consent will be active for this visit and any virtual visit you may have with one of our providers in the next 365 days.     If you have a MyChart account, a copy of this consent can be sent to you electronically.  All virtual visits are billed to your insurance company just like a traditional visit in the office.    As this is a virtual visit, video technology does not allow for your provider to perform a traditional examination.  This may limit your provider's ability to fully assess your condition.  If your provider identifies any concerns that need to be evaluated in person or the need to arrange testing (such as labs, EKG, etc.), we will make arrangements to do so.     Although advances in technology are sophisticated, we cannot ensure that it will always work on either your end or our end.  If the connection with a video visit is poor, the visit may have to be switched to a telephone visit.  With either a video or telephone visit, we are not always able to ensure that we have a secure connection.     I need to obtain your verbal consent now.   Are you willing to proceed with your visit today?    Amy Kline has provided verbal consent on 02/04/2021 for a virtual visit (video or telephone).   Amy Kline, [place]   Date: 02/04/2021 7:55 PM   Virtual Visit via Video Note   I, Amy Kline, connected with  Amy Kline  ([id number], May 28, 1930) on 02/04/21 at  7:00 PM EDT by a video-enabled telemedicine application and verified that I am speaking with the correct person using two identifiers.  Her daughter Amy Kline was also present for video visit, conferenced in to video at patient request.  Location: Patient: Virtual Visit Location Patient:  Home Provider: Virtual Visit Location Provider: Home Office   I discussed the limitations of evaluation and management by telemedicine and the availability of in person appointments. The patient expressed understanding and agreed to proceed.    History of Present Illness: Amy Kline is a 87 y.o. who identifies as a female who was assigned female at birth, and is being seen today for COVID 53.  Patient endorses yesterday afternoon she started having chills, headache and a dry cough with some nasal congestion.  Giving her medical history she decided to test for COVID.  This came back positive.  Patient states she is unsure of fevers but still having intermittent chills.  Denies any ear pain, sinus pain or tooth pain.  Denies any chest pain or shortness of breath.  Feels overall her symptoms have been mild but she was just worried about having COVID giving her multiple medical issues including cancer.  HPI: HPI  Problems:  Patient Active Problem List   Diagnosis Date Noted   Osteoporosis 10/16/2020    Class: Diagnosis of   Lumbar radiculopathy 10/09/2020   Metastasis to bone [facility]) 07/03/2020   Episode of loss of consciousness 05/14/2020   History of cerebellar stroke 05/14/2020   Gait disturbance 05/14/2020   Polyneuropathy 05/14/2020   Serum calcium elevated    Peripheral vascular disease ([place])    Parathyroid cyst ([place])  Light headedness    Kidney cysts    Incontinence    Hypertension    Hoarseness of voice    Heart palpitations    Heart murmur    Bruise    Malignant neoplasm of female breast (HCC)    Back pain    Elevated alkaline phosphatase level 02/15/2020   Dizziness 02/07/2020   Recurrent UTI 05/23/2018   H/O splenectomy 04/18/2018   Decreased vascular flow 04/01/2018   Senile purpura ([place]) 11/17/2017   Vitamin D deficiency 11/17/2017   Vertigo, intermittent 12/28/2016   Hoarse voice quality 08/31/2016   Vitamin B12 deficiency 05/07/2016   Mild cognitive  impairment 05/05/2016   Goals of care, counseling/discussion 12/19/2015   Kidney mass 10/29/2015   Pelvic mass in female 10/29/2015   Allergic rhinitis 10/22/2015   Arrhythmia 10/22/2015   Cardiomyopathy ([place]) 10/22/2015   Degeneration of lumbar intervertebral disc 10/22/2015   Hearing loss 10/22/2015   High risk medication use 10/22/2015   History of transient ischemic attack (TIA) 10/22/2015   Hyperparathyroidism ([place]) 10/22/2015   Malaise and fatigue 10/22/2015   Mixed hyperlipidemia 10/22/2015   Breast cancer of upper-inner quadrant of left female breast ([place]) 05/30/2013   Essential hypertension 11/06/2012   Cystocele 02/15/2012   GERD (gastroesophageal reflux disease)    Sleep apnea    Fibromyalgia    Hot flashes    Osteopenia    Papilloma of breast    Osteoarthritis     Allergies:  Allergies  Allergen Reactions   Neosporin [Neomycin-Polymyxin-Gramicidin] Rash   Sulfa Antibiotics Hives    itching   Bacitracin-Polymyxin B    Bexsero [Meningococcal Grp B (Recomb) Vaccine] Other (See Comments)    Pt reported severe arthralgias and fatigue after 1st dose of bexsero and wishes to not receive again.   Cefdinir Nausea And Vomiting   Denosumab     Pain in jaw/head/mouth numb/weak   Other Other (See Comments)    Polysporin causes a rash and swelling Unknown   Statins    Medications:  Current Outpatient Medications:    benzonatate (TESSALON) 100 MG capsule, Take 1 capsule (100 mg total) by mouth 3 (three) times daily as needed for cough., Disp: 30 capsule, Rfl: 0   molnupiravir EUA 200 mg CAPS, Take 4 capsules (800 mg total) by mouth 2 (two) times daily for 5 days., Disp: 40 capsule, Rfl: 0   alendronate (FOSAMAX) 70 MG tablet, Take 1 tablet (70 mg total) by mouth once a week. Take with a full glass of water on an empty stomach., Disp: 12 tablet, Rfl: 3   anastrozole (ARIMIDEX) 1 MG tablet, Take 1 tablet (1 mg total) by mouth daily., Disp: 90 tablet, Rfl: 4   aspirin 81 MG  tablet, Take 81 mg by mouth daily., Disp: , Rfl:    carvedilol (COREG) 12.5 MG tablet, Take 0.5 tablets (6.25 mg total) by mouth 2 (two) times daily with a meal., Disp: 90 tablet, Rfl: 3   ciprofloxacin (CIPRO) 500 MG tablet, Take 1 tablet (500 mg total) by mouth 2 (two) times daily., Disp: 20 tablet, Rfl: 0   cyanocobalamin 1000 MCG tablet, Take 1 tablet (1,000 mcg total) by mouth daily., Disp: 90 tablet, Rfl: 3   ergocalciferol (VITAMIN D2) 1.25 MG (50000 UT) capsule, Vitamin D2 1,250 mcg (50,000 unit) capsule  TAKE 1 CAPSULE BY MOUTH ONCE A WEEK SAME DAY EACH WEEK, Disp: 12 capsule, Rfl: 3   ibuprofen (ADVIL) 600 MG tablet, TAKE 1 TABLET(600 MG) BY MOUTH EVERY 6  HOURS AS NEEDED, Disp: 90 tablet, Rfl: 1   lactose free nutrition (BOOST) LIQD, Take 237 mLs by mouth as needed. Drinks as needed for no appetite (Patient not taking: Reported on 01/28/2021), Disp: , Rfl:    Multiple Vitamins-Minerals (PRESERVISION AREDS 2 PO), Take 1 tablet by mouth in the morning and at bedtime. (Patient not taking: Reported on 01/28/2021), Disp: , Rfl:    omeprazole (PRILOSEC) 20 MG capsule, Take 20 mg by mouth at bedtime. , Disp: , Rfl:    Polyethylene Glycol 3350 (MIRALAX PO), Take by mouth daily., Disp: , Rfl:    pyridostigmine (MESTINON) 60 MG tablet, Take 0.5 tablets (30 mg total) by mouth 3 (three) times daily., Disp: 45 tablet, Rfl: 5   SAVELLA 50 MG TABS tablet, Take 50 mg by mouth 2 (two) times daily., Disp: , Rfl:    solifenacin (VESICARE) 5 MG tablet, Take 1 tablet (5 mg total) by mouth daily., Disp: 30 tablet, Rfl: 5   telmisartan-hydrochlorothiazide (MICARDIS HCT) 40-12.5 MG tablet, Take 0.5 tablets by mouth daily. Patient needs appointment for further refills. 1 st attempt  DISCARD UNUSED 1/2 TABLET, Disp: 90 tablet, Rfl: 0   traMADol (ULTRAM) 50 MG tablet, Take 1 tablet (50 mg total) by mouth every 6 (six) hours as needed., Disp: 60 tablet, Rfl: 0  Observations/Objective: Patient is well-developed,  well-nourished in no acute distress.  Resting comfortably at home.  Head is normocephalic, atraumatic.  No labored breathing. Speech is clear and coherent with logical content.  Patient is alert and oriented at baseline.   Assessment and Plan: 1. COVID-19 - benzonatate (TESSALON) 100 MG capsule; Take 1 capsule (100 mg total) by mouth 3 (three) times daily as needed for cough.  Dispense: 30 capsule; Refill: 0 - molnupiravir EUA 200 mg CAPS; Take 4 capsules (800 mg total) by mouth 2 (two) times daily for 5 days.  Dispense: 40 capsule; Refill: 0 - MyChart COVID-19 home monitoring program; Future Patient with multiple risk factors for complicated course of illness. Discussed risks/benefits of antiviral medications including most common potential ADRs. Patient voiced understanding and would like to proceed with antiviral medication. They are candidate for molnupiravir. Rx sent to pharmacy. Supportive measures, OTC medications and vitamin regimen reviewed.  Tessalon per orders. Patient has been enrolled in a MyChart COVID symptom monitoring program. Samule Dry reviewed in detail. Strict ER precautions discussed with patient, husband and daughter.  They all voiced understanding and agreement with plan.   Follow Up Instructions: I discussed the assessment and treatment plan with the patient. The patient was provided an opportunity to ask questions and all were answered. The patient agreed with the plan and demonstrated an understanding of the instructions.  A copy of instructions were sent to the patient via MyChart.  The patient was advised to call back or seek an in-person evaluation if the symptoms worsen or if the condition fails to improve as anticipated.  Time:  I spent 20 minutes with the patient via telehealth technology discussing the above problems/concerns.    Amy Rio, PA-C

## 2021-02-04 NOTE — Telephone Encounter (Signed)
Home Health called regarding initial evaluation for home health. Patient refused for home health to come today due to patient and family testing positive for COVID 02/03/21. [facility] states she informed the patient that they deal with COVID positive patient on a a daily basis. Patient informed home health that she wanted to wait until Friday 02/07/21 or Monday 02/10/21. Home health wanting to make sure it was ok with [place]. Talked with Rosanne Sack, PA-C and she states is was ok for them to wait.

## 2021-02-05 ENCOUNTER — Encounter: Payer: Self-pay | Admitting: Oncology

## 2021-02-10 ENCOUNTER — Ambulatory Visit: Payer: Medicare Other

## 2021-02-10 LAB — MULTIPLE MYELOMA PANEL, SERUM
Albumin SerPl Elph-Mcnc: 3.6 g/dL (ref 2.9–4.4)
Albumin/Glob SerPl: 1.2 (ref 0.7–1.7)
Alpha 1: 0.3 g/dL (ref 0.0–0.4)
Alpha2 Glob SerPl Elph-Mcnc: 0.7 g/dL (ref 0.4–1.0)
B-Globulin SerPl Elph-Mcnc: 1.2 g/dL (ref 0.7–1.3)
Gamma Glob SerPl Elph-Mcnc: 0.9 g/dL (ref 0.4–1.8)
Globulin, Total: 3.1 g/dL (ref 2.2–3.9)
IgA/Immunoglobulin A, Serum: 422 mg/dL (ref 64–422)
IgG (Immunoglobin G), Serum: 1144 mg/dL (ref 586–1602)
IgM (Immunoglobulin M), Srm: 40 mg/dL (ref 26–217)
Total Protein: 6.7 g/dL (ref 6.0–8.5)

## 2021-02-10 LAB — MYASTHENIA GRAVIS PROFILE
AChR Binding Ab, Serum: 0.07 nmol/L (ref 0.00–0.24)
Acetylchol Block Ab: 7 % (ref 0–25)

## 2021-02-10 LAB — ANTI-HU, ANTI-RI, ANTI-YO IFA
Anti-Hu Ab: NEGATIVE
Anti-Ri Ab: NEGATIVE
Anti-Yo Ab: NEGATIVE

## 2021-02-10 LAB — MUSK ANTIBODIES: MuSK Antibodies: <1 U/mL

## 2021-02-10 LAB — CK: Total CK: 52 U/L (ref 26–161)

## 2021-02-11 ENCOUNTER — Inpatient Hospital Stay: Payer: Medicare Other

## 2021-02-12 NOTE — Progress Notes (Signed)
[facility]  [address] [place],  [place]  [id number] [phone]  Clinic Day:  02/19/2021  Referring physician: Raina Mina., MD  This document serves as a record of services personally performed by Hosie Poisson, MD. It was created on their behalf by [facility], a trained medical scribe. The creation of this record is based on the scribe's personal observations and the provider's statements to them.  CHIEF COMPLAINT:  CC: History of recurrent breast cancer   Current Treatment:  Trastuzumab every 3 weeks, Aledronate , anastrozole 1 mg daily  HISTORY OF PRESENT ILLNESS:  Amy Kline is a 87 y.o. female who presents for a transfer of care and continued management and evaluation of recurrent breast cancer.  She was originally diagnosed with a stage IA left breast cancer in May 2013 when biopsy confirmed invasive ductal carcinoma, grade 2.  Estrogen and progesterone receptors were positive at 100% and 96%, and HER2 was negative.  She was treated with left mastectomy in June and repeat HER2 was positive by [facility].  She followed with Dr. Lurline Del, and was started on tamoxifene 20 mg daily in July 2013, but this was discontinued in July 2017 due to concerns regarding endometrial hyperplasia.  She completed one year of therapy with trastuzumab in September 2014.  She did have some cardiotoxicity with this therapy, and so this had to be held this for 5-6 weeks.  However, in the summer of 2021 she started having trouble walking with generalized lower extremity weakness and lower back pain.  She also was having episodes of syncope.  MRI brain from August was negative, but MRI lumbar spine from December revealed STIR hyperintense foci at T12-L1 and S1.  MRI pelvis showed similar areas, and in addition, numerous enhancing marrow replacing lesions in the pelvis.  Bone marrow biopsy from January 2022 showed no evidence of carcinoma and a normocellular  marrow.  CT chest, abdomen and pelvis showed increased left axillary and subpectoral lymphadenopathy but no other evidence of metastatic disease.  She underwent left axillary lymph node biopsy on February 18th which confirmed triple positive carcinoma involving fatty tissue.  She was subsequently placed on anastrozole which she started on Febuary 28th, and denosumab was initiated on March 3rd.  Trastuzumab was recommended, and initiated on March 18th.  ECHO from September 2021 revealed an ejection fraction between 60-65%.  Tumor markers from January revealed a CA 27-29 of 87, a CEA of 6.65 and CA 19-9 was normal.  Recent CA 27-29 from March is significantly elevated at 221, and CEA was 10.12.  Staging PET imaging from March confirmed metastatic breast cancer as evidenced by hypermetabolic left subpectoral/left axillary lymph nodes and widespread hypermetabolic osseous lesions.  There is mild hypermetabolism within an nonenlarged subcarinal lymph node and both hilar regions.  She did receive Xgeva on March 3rd, but did not tolerate this and so it has been discontinued.  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  She is status post splenectomy, and has had both Prevnar 13 and pneumovax within the last few years.  She will be due for her next pneumovax in 2024.  She will need to get her meningococcal vaccines and hemophilus influenzae.   Bone density scan from March revealed osteoporosis with a T-score of -3.3 of the forearm, and right femur neck measures -2.0, considered osteopenic.  She did have her meningitis vaccine and experienced severe soreness which  is resolving. ECHO from June 30th revealed an EF between 55-60%.  She will be evaluated for home health assistance.    INTERVAL HISTORY:  Amy Kline is here for follow up prior to an 8th cycle of trastuzumab.  She continues anastrozole daily.  He daughter reports significant worsening memory impairment and brain fog.  The  patient states that this was not as severe before starting therapy.  She also has been having worsening lower extremity weakness and has trouble getting around.  She does have home health, and physical therapy is coming out tomorrow for evaluation. She continues Lasix every other day and alendronate 70 mg once weekly.  Blood counts and chemistries are unremarkable except for a potassium of 3.4, a BUN of 22, improved, and a calcium of 10.3.  She will increase potassium in her diet.  Urinalysis looks much improved, and culture is pending.  Her  appetite is fair, and she has lost 3 and 1/2 pounds since her last visit.  She denies fever, chills or other signs of infection.  She denies nausea, vomiting, bowel issues, or abdominal pain.  She denies sore throat, cough, dyspnea, or chest pain.  Her daughter accompanies her today.  Review of Systems  Constitutional: Negative.  Negative for appetite change, chills, fatigue, fever and unexpected weight change.  HENT:  Negative.    Eyes: Negative.   Respiratory: Negative.  Negative for chest tightness, cough, hemoptysis, shortness of breath and wheezing.   Cardiovascular: Negative.  Negative for chest pain, leg swelling and palpitations.  Gastrointestinal: Negative.  Negative for abdominal distention, abdominal pain, blood in stool, constipation, diarrhea, nausea and vomiting.  Endocrine: Negative.   Genitourinary:  Negative for difficulty urinating, dysuria, frequency, hematuria and pelvic pain.   Musculoskeletal:  Negative for arthralgias, back pain, flank pain, gait problem and myalgias.  Skin: Negative.   Neurological:  Positive for extremity weakness (of the lower extremities). Negative for dizziness, gait problem, headaches, light-headedness, numbness, seizures and speech difficulty.  Hematological: Negative.   Psychiatric/Behavioral:  Positive for confusion (intermittent but worsening). Negative for depression and sleep disturbance. The patient is not  nervous/anxious.     VITALS:  There were no vitals taken for this visit.  Wt Readings from Last 3 Encounters:  01/28/21 173 lb 8 oz (78.7 kg)  01/27/21 175 lb (79.4 kg)  01/10/21 175 lb 4 oz (79.5 kg)    There is no height or weight on file to calculate BMI.  Performance status (ECOG): 2 - Symptomatic, <50% confined to bed  PHYSICAL EXAM:  Physical Exam Constitutional:      General: She is not in acute distress.    Appearance: Normal appearance. She is normal weight.  HENT:     Head: Normocephalic and atraumatic.  Eyes:     General: No scleral icterus.    Extraocular Movements: Extraocular movements intact.     Conjunctiva/sclera: Conjunctivae normal.     Pupils: Pupils are equal, round, and reactive to light.  Cardiovascular:     Rate and Rhythm: Normal rate and regular rhythm.     Pulses: Normal pulses.     Heart sounds: Normal heart sounds. No murmur heard.   No friction rub. No gallop.  Pulmonary:     Effort: Pulmonary effort is normal. No respiratory distress.     Breath sounds: Normal breath sounds.  Chest:  Breasts:    Right: Normal.     Left: Absent.     Comments: Left mastectomy is negative and right breast is  without masses. Abdominal:     General: Bowel sounds are normal. There is no distension.     Palpations: Abdomen is soft. There is no hepatomegaly, splenomegaly or mass.     Tenderness: There is no abdominal tenderness.  Musculoskeletal:        General: Normal range of motion.     Cervical back: Normal range of motion and neck supple.     Right lower leg: No edema.     Left lower leg: No edema.  Lymphadenopathy:     Cervical: No cervical adenopathy.  Skin:    General: Skin is warm and dry.  Neurological:     General: No focal deficit present.     Mental Status: She is alert and oriented to person, place, and time. Mental status is at baseline.  Psychiatric:        Mood and Affect: Mood normal.        Behavior: Behavior normal.        Thought  Content: Thought content normal.        Judgment: Judgment normal.    LABS:   CBC Latest Ref Rng & Units 02/19/2021 01/28/2021 01/08/2021  WBC - 8.5 7.4 8.8  Hemoglobin 12.0 - 16.0 13.9 13.6 13.9  Hematocrit 36 - 46 40 40 40  Platelets 150 - 399 308 337 318   CMP Latest Ref Rng & Units 02/19/2021 01/28/2021 01/27/2021  Glucose 70 - 99 mg/dL - - -  BUN 4 - 21 22(A) 26(A) -  Creatinine 0.5 - 1.1 0.8 0.8 -  Sodium 137 - 147 135(A) 136(A) -  Potassium 3.4 - 5.3 3.4 3.6 -  Chloride 99 - 108 99 102 -  CO2 13 - 22 29(A) 28(A) -  Calcium 8.7 - 10.7 10.2 10.7 -  Total Protein 6.0 - 8.5 g/dL - - 6.7  Total Bilirubin 0.3 - 1.2 mg/dL - - -  Alkaline Phos 25 - 125 127(A) 124 -  AST 13 - 35 36(A) 32 -  ALT 7 - 35 20 19 -     Lab Results  Component Value Date   CEA1 8.2 (H) 01/08/2021   /  CEA  Date Value Ref Range Status  01/08/2021 8.2 (H) 0.0 - 4.7 ng/mL Final    Comment:    (NOTE)                             Nonsmokers          <3.9                             Smokers             <5.6 [employer] Electrochemiluminescence Immunoassay (ECLIA) Values obtained with different assay methods or kits cannot be used interchangeably.  Results cannot be interpreted as absolute evidence of the presence or absence of malignant disease. Performed At: [facility] [place], [place] [id number] Rush Farmer MD BW:[id number]     Lab Results  Component Value Date   ULA453 23.7 07/22/2020     STUDIES:    HISTORY:   Allergies:  Allergies  Allergen Reactions   Neosporin [Neomycin-Polymyxin-Gramicidin] Rash   Sulfa Antibiotics Hives    itching   Bacitracin-Polymyxin B    Bexsero [Meningococcal Grp B (Recomb) Vaccine] Other (See Comments)    Pt reported severe arthralgias and fatigue after 1st dose of  bexsero and wishes to not receive again.   Cefdinir Nausea And Vomiting   Denosumab     Pain in jaw/head/mouth numb/weak   Other Other (See Comments)     Polysporin causes a rash and swelling Unknown   Statins     Current Medications: Current Outpatient Medications  Medication Sig Dispense Refill   alendronate (FOSAMAX) 70 MG tablet Take 1 tablet (70 mg total) by mouth once a week. Take with a full glass of water on an empty stomach. 12 tablet 3   anastrozole (ARIMIDEX) 1 MG tablet Take 1 tablet (1 mg total) by mouth daily. 90 tablet 4   aspirin 81 MG tablet Take 81 mg by mouth daily.     benzonatate (TESSALON) 100 MG capsule Take 1 capsule (100 mg total) by mouth 3 (three) times daily as needed for cough. 30 capsule 0   carvedilol (COREG) 12.5 MG tablet Take 0.5 tablets (6.25 mg total) by mouth 2 (two) times daily with a meal. 90 tablet 3   cyanocobalamin 1000 MCG tablet Take 1 tablet (1,000 mcg total) by mouth daily. 90 tablet 3   ergocalciferol (VITAMIN D2) 1.25 MG (50000 UT) capsule Vitamin D2 1,250 mcg (50,000 unit) capsule  TAKE 1 CAPSULE BY MOUTH ONCE A WEEK SAME DAY EACH WEEK 12 capsule 3   ibuprofen (ADVIL) 600 MG tablet TAKE 1 TABLET(600 MG) BY MOUTH EVERY 6 HOURS AS NEEDED 90 tablet 1   lactose free nutrition (BOOST) LIQD Take 237 mLs by mouth as needed. Drinks as needed for no appetite (Patient not taking: Reported on 01/28/2021)     Multiple Vitamins-Minerals (PRESERVISION AREDS 2 PO) Take 1 tablet by mouth in the morning and at bedtime. (Patient not taking: Reported on 01/28/2021)     omeprazole (PRILOSEC) 20 MG capsule Take 20 mg by mouth at bedtime.      Polyethylene Glycol 3350 (MIRALAX PO) Take by mouth daily.     pyridostigmine (MESTINON) 60 MG tablet Take 0.5 tablets (30 mg total) by mouth 3 (three) times daily. 45 tablet 5   SAVELLA 50 MG TABS tablet Take 50 mg by mouth 2 (two) times daily.     solifenacin (VESICARE) 5 MG tablet Take 1 tablet (5 mg total) by mouth daily. 30 tablet 5   telmisartan-hydrochlorothiazide (MICARDIS HCT) 40-12.5 MG tablet Take 0.5 tablets by mouth daily. Patient needs appointment for further  refills. 1 st attempt  DISCARD UNUSED 1/2 TABLET 90 tablet 0   traMADol (ULTRAM) 50 MG tablet Take 1 tablet (50 mg total) by mouth every 6 (six) hours as needed. 60 tablet 0   No current facility-administered medications for this visit.     ASSESSMENT & PLAN:   Assessment:   1.  Stage IA triple positive left breast cancer, diagnosed in May 2013.  She was treated with left mastectomy in June.  She was started on tamoxifene 20 mg daily in July 2013, but this was discontinued in July 2017 due to concerns regarding endometrial hyperplasia.  She completed one year of therapy with trastuzumab in September 2014.    2.  Recurrent breast cancer, February 2022, with evidence of left axillary and regional lymph nodes as well as bone lesions.  She was subsequently placed on anastrozole which she started on Febuary 28th.  Trastuzumab every 3 weeks was recommended and started on March 18th 2022.    3.  Significant elevation of the CA 27-29 measuring 221 in March, 288 in early April, 233 in June and 244  in July.  4.  Lower extremity weakness and episodes of confusion, worsening, being evaluated by neurology and felt to be neuropathy.  She is currently on Mestinon 0.5 mg TID. Home health will be coming out to evaluate for any possible assistance available, especially with her medications and PT.  5.  Osteoporosis of the forearm.  She received denosumab on March 3rd, but did not tolerate this therapy due to jaw pain, weakness and fatigue, and so it was discontinued.  We decided to place her on alendronate 70 mg weekly and tolerating this well.  We will continue the oral calcium supplement once daily, even though her level is mildly elevated, since she is on the alendronate.  6.  Status post splenectomy.  She has started vaccines and had a severe reaction with the meningitis vaccines.  We will not pursue further meningitis vaccines.   7.  Hyponatremia, mild.  I advised to increase her dietary salt as well.  8.   Hypercalcemia, improved, she is on oral daily supplement.  9.  Reaction to the meningitis vaccines.  Since both were given on same day, we don't know which one.  Will not administer any more.  10.  Worsening memory impairment.  She and her family are suspicious this represents possible medication effect.  We reviewed her therapies and the most likely culprit is anastrozole.  We will try stopping the anastrozole to see if her symptoms improve, but closely monitor her cancer on the trastuzumab.  Plan: She will continue to be followed by neurology.  We will try discontinuing anastrozole in view of her worsening memory impairment to see if her symptoms improve.  She will proceed with a 8th cycle of trastuzumab on Friday.  She knows to continue alendronate 70 mg weekly.  We will plan to see her back in 3 weeks with CBC, CMP and CA 27.29 prior to her 9th trastuzumab.  We will continue to check a CEA and CA 27.29 every 3 weeks.  Both she and her daughter verbalize understanding of and agreement to the plans discussed today. They know to call the office should any new questions or concerns arise.    I provided 25 minutes of face-to-face time during this this encounter and > 50% was spent counseling as documented under my assessment and plan.   ADDENDUM: Her latest CA 27.29 is mildly worse at 261 and CEA is stable at 8.3.  I will notify the patient.   [name], MD [facility] [address] [place] [address] Dept: [phone] Dept Fax: [phone]

## 2021-02-14 ENCOUNTER — Other Ambulatory Visit: Payer: Self-pay | Admitting: Cardiology

## 2021-02-14 ENCOUNTER — Encounter: Payer: Self-pay | Admitting: Hematology and Oncology

## 2021-02-14 DIAGNOSIS — R55 Syncope and collapse: Secondary | ICD-10-CM

## 2021-02-14 DIAGNOSIS — C50212 Malignant neoplasm of upper-inner quadrant of left female breast: Secondary | ICD-10-CM

## 2021-02-14 DIAGNOSIS — I10 Essential (primary) hypertension: Secondary | ICD-10-CM

## 2021-02-14 DIAGNOSIS — I427 Cardiomyopathy due to drug and external agent: Secondary | ICD-10-CM

## 2021-02-19 ENCOUNTER — Other Ambulatory Visit: Payer: Self-pay | Admitting: Oncology

## 2021-02-19 ENCOUNTER — Inpatient Hospital Stay: Payer: Medicare Other | Attending: Oncology | Admitting: Oncology

## 2021-02-19 ENCOUNTER — Encounter: Payer: Self-pay | Admitting: Oncology

## 2021-02-19 ENCOUNTER — Inpatient Hospital Stay: Payer: Medicare Other

## 2021-02-19 ENCOUNTER — Telehealth: Payer: Self-pay | Admitting: Oncology

## 2021-02-19 VITALS — BP 168/77 | HR 77 | Temp 98.1°F | Resp 18 | Ht 65.5 in | Wt 169.4 lb

## 2021-02-19 DIAGNOSIS — N3090 Cystitis, unspecified without hematuria: Secondary | ICD-10-CM

## 2021-02-19 DIAGNOSIS — C50212 Malignant neoplasm of upper-inner quadrant of left female breast: Secondary | ICD-10-CM

## 2021-02-19 DIAGNOSIS — C7951 Secondary malignant neoplasm of bone: Secondary | ICD-10-CM | POA: Insufficient documentation

## 2021-02-19 DIAGNOSIS — E871 Hypo-osmolality and hyponatremia: Secondary | ICD-10-CM | POA: Insufficient documentation

## 2021-02-19 DIAGNOSIS — Z9012 Acquired absence of left breast and nipple: Secondary | ICD-10-CM | POA: Insufficient documentation

## 2021-02-19 DIAGNOSIS — Z87440 Personal history of urinary (tract) infections: Secondary | ICD-10-CM | POA: Diagnosis not present

## 2021-02-19 DIAGNOSIS — Z5111 Encounter for antineoplastic chemotherapy: Secondary | ICD-10-CM | POA: Diagnosis present

## 2021-02-19 DIAGNOSIS — Z79811 Long term (current) use of aromatase inhibitors: Secondary | ICD-10-CM | POA: Insufficient documentation

## 2021-02-19 DIAGNOSIS — Z8616 Personal history of COVID-19: Secondary | ICD-10-CM | POA: Insufficient documentation

## 2021-02-19 DIAGNOSIS — Z79899 Other long term (current) drug therapy: Secondary | ICD-10-CM | POA: Insufficient documentation

## 2021-02-19 DIAGNOSIS — Z170 Estrogen receptor positive status [ER+]: Secondary | ICD-10-CM

## 2021-02-19 LAB — COMPREHENSIVE METABOLIC PANEL
Albumin: 3.9 (ref 3.5–5.0)
Calcium: 10.2 (ref 8.7–10.7)

## 2021-02-19 LAB — HEPATIC FUNCTION PANEL
ALT: 20 (ref 7–35)
AST: 36 — AB (ref 13–35)
Alkaline Phosphatase: 127 — AB (ref 25–125)
Bilirubin, Total: 0.6

## 2021-02-19 LAB — BASIC METABOLIC PANEL
BUN: 22 — AB (ref 4–21)
CO2: 29 — AB (ref 13–22)
Chloride: 99 (ref 99–108)
Creatinine: 0.8 (ref 0.5–1.1)
Glucose: 110
Potassium: 3.4 (ref 3.4–5.3)
Sodium: 135 — AB (ref 137–147)

## 2021-02-19 LAB — CBC AND DIFFERENTIAL
HCT: 40 (ref 36–46)
Hemoglobin: 13.9 (ref 12.0–16.0)
Neutrophils Absolute: 4.25
Platelets: 308 (ref 150–399)
WBC: 8.5

## 2021-02-19 LAB — CBC: RBC: 4.36 (ref 3.87–5.11)

## 2021-02-19 NOTE — Telephone Encounter (Signed)
Per 8/10 los next appt scheduled and given to patient 

## 2021-02-20 ENCOUNTER — Telehealth: Payer: Self-pay

## 2021-02-20 NOTE — Telephone Encounter (Addendum)
I spoke with Amy Kline, & notified her of Dr Remi Deter response. ----- Message from [name], MD sent at 02/19/2021  4:33 PM EDT ----- Regarding: RE: Home health nursing orders Yes, pls order, she had several questions regarding meds that I tried to answer. We are stopping the anastrazole for now ----- Message ----- From: [name], RN Sent: 02/19/2021   1:41 PM EDT To: [name], MD Subject: Home health nursing orders                     Received call from Luis M. Cintron, with [facility]. She is requesting nursing visits for medication & medical management, including monitoring blood pressure and nutrition. Nursing visits twice weekly for 4 weeks then twice monthly.  [phone]

## 2021-02-21 ENCOUNTER — Inpatient Hospital Stay: Payer: Medicare Other

## 2021-02-21 ENCOUNTER — Other Ambulatory Visit: Payer: Self-pay

## 2021-02-21 VITALS — BP 165/74 | HR 68 | Temp 98.0°F | Resp 18 | Ht 65.5 in | Wt 172.2 lb

## 2021-02-21 DIAGNOSIS — C50212 Malignant neoplasm of upper-inner quadrant of left female breast: Secondary | ICD-10-CM

## 2021-02-21 DIAGNOSIS — Z5111 Encounter for antineoplastic chemotherapy: Secondary | ICD-10-CM | POA: Diagnosis not present

## 2021-02-21 DIAGNOSIS — C7951 Secondary malignant neoplasm of bone: Secondary | ICD-10-CM

## 2021-02-21 LAB — CEA: CEA: 8.3 ng/mL — ABNORMAL HIGH (ref 0.0–4.7)

## 2021-02-21 LAB — CANCER ANTIGEN 27.29: CA 27.29: 261.5 U/mL — ABNORMAL HIGH (ref 0.0–38.6)

## 2021-02-21 MED ORDER — ACETAMINOPHEN 325 MG PO TABS: 650.0000 mg | ORAL_TABLET | Freq: Once | ORAL | Status: DC

## 2021-02-21 MED ORDER — DIPHENHYDRAMINE HCL 25 MG PO CAPS: 25.0000 mg | ORAL_CAPSULE | Freq: Once | ORAL | Status: DC

## 2021-02-21 MED ORDER — TRASTUZUMAB-HYALURONIDASE-OYSK 600-10000 MG-UNT/5ML ~~LOC~~ SOLN
600.0000 mg | Freq: Once | SUBCUTANEOUS | Status: AC
  Administered 2021-02-21: 600 mg via SUBCUTANEOUS
  Filled 2021-02-21: qty 5

## 2021-02-21 NOTE — Patient Instructions (Signed)
[facility] [place]  Discharge Instructions: Thank you for choosing [facility] to provide your oncology and hematology care.  If you have a lab appointment with the Cancer Center, please go directly to the Cancer Center and check in at the registration area.   Wear comfortable clothing and clothing appropriate for easy access to any Portacath or PICC line.   We strive to give you quality time with your provider. You may need to reschedule your appointment if you arrive late (15 or more minutes).  Arriving late affects you and other patients whose appointments are after yours.  Also, if you miss three or more appointments without notifying the office, you may be dismissed from the clinic at the provider's discretion.      For prescription refill requests, have your pharmacy contact our office and allow 72 hours for refills to be completed.    Today you received the following chemotherapy and/or immunotherapy agents Trastuzumab     To help prevent nausea and vomiting after your treatment, we encourage you to take your nausea medication as directed.  BELOW ARE SYMPTOMS THAT SHOULD BE REPORTED IMMEDIATELY: *FEVER GREATER THAN 100.4 F (38 C) OR HIGHER *CHILLS OR SWEATING *NAUSEA AND VOMITING THAT IS NOT CONTROLLED WITH YOUR NAUSEA MEDICATION *UNUSUAL SHORTNESS OF BREATH *UNUSUAL BRUISING OR BLEEDING *URINARY PROBLEMS (pain or burning when urinating, or frequent urination) *BOWEL PROBLEMS (unusual diarrhea, constipation, pain near the anus) TENDERNESS IN MOUTH AND THROAT WITH OR WITHOUT PRESENCE OF ULCERS (sore throat, sores in mouth, or a toothache) UNUSUAL RASH, SWELLING OR PAIN  UNUSUAL VAGINAL DISCHARGE OR ITCHING   Items with * indicate a potential emergency and should be followed up as soon as possible or go to the Emergency Department if any problems should occur.  Please show the CHEMOTHERAPY ALERT CARD or IMMUNOTHERAPY ALERT CARD at check-in to the  Emergency Department and triage nurse.  Should you have questions after your visit or need to cancel or reschedule your appointment, please contact [facility] [place]  Dept: [phone]  and follow the prompts.  Office hours are 8:00 a.m. to 4:30 p.m. Monday - Friday. Please note that voicemails left after 4:00 p.m. may not be returned until the following business day.  We are closed weekends and major holidays. You have access to a nurse at all times for urgent questions. Please call the main number to the clinic Dept: [phone] and follow the prompts.  For any non-urgent questions, you may also contact your provider using MyChart. We now offer e-Visits for anyone 18 and older to request care online for non-urgent symptoms. For details visit [email or web address].   Also download the MyChart app! Go to the app store, search "MyChart", open the app, select [facility], and log in with your MyChart username and password.  Due to Covid, a mask is required upon entering the hospital/clinic. If you do not have a mask, one will be given to you upon arrival. For doctor visits, patients may have 1 support person aged 18 or older with them. For treatment visits, patients cannot have anyone with them due to current Covid guidelines and our immunocompromised population.    

## 2021-02-21 NOTE — Progress Notes (Signed)
1412: PT STABLE AT TIME OF DISCHARGE

## 2021-02-24 ENCOUNTER — Telehealth: Payer: Self-pay

## 2021-02-24 ENCOUNTER — Encounter: Payer: Self-pay | Admitting: Oncology

## 2021-02-24 NOTE — Telephone Encounter (Signed)
Per Dr. [name] Occupational therapy once weekly for 7 weeks approved.  Called Manuela Schwartz and left her message.

## 2021-02-25 ENCOUNTER — Encounter: Payer: Self-pay | Admitting: Hematology and Oncology

## 2021-02-26 ENCOUNTER — Encounter: Payer: Self-pay | Admitting: Oncology

## 2021-03-04 ENCOUNTER — Other Ambulatory Visit: Payer: Self-pay | Admitting: Neurology

## 2021-03-04 NOTE — Progress Notes (Addendum)
ADDENDUM:  This visit was conducted by phone with the patient at her home and lasted 20 minutes.   [facility]  [address] [place],  [place]  [address] [phone]  This appointment was a virtual visit by telephone and the patient agreed to this type of appointment  Clinic Day:  03/12/2021  Referring physician: Raina Mina., MD  This document serves as a record of services personally performed by Hosie Poisson, MD. It was created on their behalf by [facility] E, a trained medical scribe. The creation of this record is based on the scribe's personal observations and the provider's statements to them.  CHIEF COMPLAINT:  CC: History of recurrent breast cancer   Current Treatment:  Trastuzumab every 3 weeks, Aledronate , anastrozole 1 mg daily  HISTORY OF PRESENT ILLNESS:  Ruvi D Veals is a 87 y.o. female who presents for a transfer of care and continued management and evaluation of recurrent breast cancer.  She was originally diagnosed with a stage IA left breast cancer in May 2013 when biopsy confirmed invasive ductal carcinoma, grade 2.  Estrogen and progesterone receptors were positive at 100% and 96%, and HER2 was negative.  She was treated with left mastectomy in June and repeat HER2 was positive by [facility].  She followed with Dr. Lurline Del, and was started on tamoxifene 20 mg daily in July 2013, but this was discontinued in July 2017 due to concerns regarding endometrial hyperplasia.  She completed one year of therapy with trastuzumab in September 2014.  She did have some cardiotoxicity with this therapy, and so this had to be held this for 5-6 weeks.  However, in the summer of 2021 she started having trouble walking with generalized lower extremity weakness and lower back pain.  She also was having episodes of syncope.  MRI brain from August was negative, but MRI lumbar spine from December revealed STIR hyperintense foci at T12-L1 and  S1.  MRI pelvis showed similar areas, and in addition, numerous enhancing marrow replacing lesions in the pelvis.  Bone marrow biopsy from January 2022 showed no evidence of carcinoma and a normocellular marrow.  CT chest, abdomen and pelvis showed increased left axillary and subpectoral lymphadenopathy but no other evidence of metastatic disease.  She underwent left axillary lymph node biopsy on February 18th which confirmed triple positive carcinoma involving fatty tissue.  She was subsequently placed on anastrozole which she started on Febuary 28th, and denosumab was initiated on March 3rd.  Trastuzumab was recommended, and initiated on March 18th.  ECHO from September 2021 revealed an ejection fraction between 60-65%.  Tumor markers from January revealed a CA 27-29 of 87, a CEA of 6.65 and CA 19-9 was normal.  Recent CA 27-29 from March is significantly elevated at 221, and CEA was 10.12.  Staging PET imaging from March confirmed metastatic breast cancer as evidenced by hypermetabolic left subpectoral/left axillary lymph nodes and widespread hypermetabolic osseous lesions.  There is mild hypermetabolism within an nonenlarged subcarinal lymph node and both hilar regions.  She did receive Xgeva on March 3rd, but did not tolerate this and so it has been discontinued.  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  She is status post splenectomy, and has had both Prevnar 13 and pneumovax within the last few years.  She will be due for her next pneumovax in 2024.  She will need to get her meningococcal vaccines and hemophilus  influenzae.  Bone density scan from March revealed osteoporosis with a T-score of -3.3 of the forearm, and right femur neck measures -2.0, considered osteopenic.  She did have her meningitis vaccine and experienced severe soreness which is resolving. ECHO from June 30th revealed an EF between 55-60%.  She will be evaluated for home health assistance.   She was placed on alendronate 70 mg weekly.    INTERVAL HISTORY:  We telephoned Rainee for follow up prior to a 9th cycle of trastuzumab.  She has stopped the anastrozole 1 week ago, but is still doing quite poorly.  She had a severe episode of constipation last weekend which left her feeling "washed out" after it was resolved.  She has had severe worsening confusion and decrease in memory, especially over the last 3 weeks.  She could not remember where her bedroom was.  She's also much less mobile and unable to ambulate to get to the car as she was able to do before.  She is not eating very well cause nothing tastes good to her.  She complaints of feeling sluggish and is sleeping much of the time.  She denies pain.  Home health is coming out with both PT and OT.  I have asked them to draw her labs in the morning to include CBC, CMP and CA 27-29.  We will also repeat a urinalysis and urine culture to follow up on her UTI.  She did have COVID infection recently and so some of these problems may be after effects of that.  She denies fever, chills or other signs of infection.  She denies nausea, vomiting, bowel issues, or abdominal pain.  She denies sore throat, cough, dyspnea, or chest pain.   Review of Systems  Constitutional:  Positive for fatigue (and sleepiness). Negative for appetite change, chills, fever and unexpected weight change.  HENT:  Negative.    Eyes: Negative.   Respiratory: Negative.  Negative for chest tightness, cough, hemoptysis, shortness of breath and wheezing.   Cardiovascular: Negative.  Negative for chest pain, leg swelling and palpitations.  Gastrointestinal:  Positive for constipation. Negative for abdominal distention, abdominal pain, blood in stool, diarrhea, nausea and vomiting.  Endocrine: Negative.   Genitourinary:  Negative for difficulty urinating, dysuria, frequency, hematuria and pelvic pain.   Musculoskeletal:  Positive for gait problem (with 2 falls since her last  visit). Negative for arthralgias, back pain, flank pain and myalgias.  Skin: Negative.   Neurological:  Positive for extremity weakness (of the lower extremities) and gait problem (with 2 falls since her last visit). Negative for dizziness, headaches, light-headedness, numbness, seizures and speech difficulty.  Hematological: Negative.   Psychiatric/Behavioral:  Positive for confusion (worsening). Negative for depression and sleep disturbance. The patient is not nervous/anxious.     VITALS:  There were no vitals taken for this visit.  Wt Readings from Last 3 Encounters:  02/21/21 172 lb 4 oz (78.1 kg)  02/19/21 169 lb 6.4 oz (76.8 kg)  01/28/21 173 lb 8 oz (78.7 kg)    There is no height or weight on file to calculate BMI.  Performance status (ECOG): 2 - Symptomatic, <50% confined to bed  PHYSICAL EXAM:  Physical Exam: this was a virtual visit  LABS:   CBC Latest Ref Rng & Units 02/19/2021 01/28/2021 01/08/2021  WBC - 8.5 7.4 8.8  Hemoglobin 12.0 - 16.0 13.9 13.6 13.9  Hematocrit 36 - 46 40 40 40  Platelets 150 - 399 308 337 318   CMP  Latest Ref Rng & Units 02/19/2021 01/28/2021 01/27/2021  Glucose 70 - 99 mg/dL - - -  BUN 4 - 21 22(A) 26(A) -  Creatinine 0.5 - 1.1 0.8 0.8 -  Sodium 137 - 147 135(A) 136(A) -  Potassium 3.4 - 5.3 3.4 3.6 -  Chloride 99 - 108 99 102 -  CO2 13 - 22 29(A) 28(A) -  Calcium 8.7 - 10.7 10.2 10.7 -  Total Protein 6.0 - 8.5 g/dL - - 6.7  Total Bilirubin 0.3 - 1.2 mg/dL - - -  Alkaline Phos 25 - 125 127(A) 124 -  AST 13 - 35 36(A) 32 -  ALT 7 - 35 20 19 -     Lab Results  Component Value Date   CEA1 8.3 (H) 02/19/2021   /  CEA  Date Value Ref Range Status  02/19/2021 8.3 (H) 0.0 - 4.7 ng/mL Final    Comment:    (NOTE)                             Nonsmokers          <3.9                             Smokers             <5.6 Roche [employer] Electrochemiluminescence Immunoassay (ECLIA) Values obtained with different assay methods or  kits cannot be used interchangeably.  Results cannot be interpreted as absolute evidence of the presence or absence of malignant disease. Performed At: [facility] [place], [place] [id number] Rush Farmer MD FA:[id number]     Lab Results  Component Value Date   ONG295 23.7 07/22/2020     STUDIES:    HISTORY:   Allergies:  Allergies  Allergen Reactions   Neosporin [Neomycin-Polymyxin-Gramicidin] Rash   Sulfa Antibiotics Hives    itching   Bacitracin-Polymyxin B    Bexsero [Meningococcal Grp B (Recomb) Vaccine] Other (See Comments)    Pt reported severe arthralgias and fatigue after 1st dose of bexsero and wishes to not receive again.   Cefdinir Nausea And Vomiting   Denosumab     Pain in jaw/head/mouth numb/weak   Other Other (See Comments)    Polysporin causes a rash and swelling Unknown   Statins     Current Medications: Current Outpatient Medications  Medication Sig Dispense Refill   alendronate (FOSAMAX) 70 MG tablet Take 1 tablet (70 mg total) by mouth once a week. Take with a full glass of water on an empty stomach. 12 tablet 3   aspirin 81 MG tablet Take 81 mg by mouth daily.     benzonatate (TESSALON) 100 MG capsule Take 1 capsule (100 mg total) by mouth 3 (three) times daily as needed for cough. 30 capsule 0   carvedilol (COREG) 12.5 MG tablet Take 0.5 tablets (6.25 mg total) by mouth 2 (two) times daily with a meal. 90 tablet 3   cyanocobalamin 1000 MCG tablet Take 1 tablet (1,000 mcg total) by mouth daily. 90 tablet 3   ergocalciferol (VITAMIN D2) 1.25 MG (50000 UT) capsule Vitamin D2 1,250 mcg (50,000 unit) capsule  TAKE 1 CAPSULE BY MOUTH ONCE A WEEK SAME DAY EACH WEEK 12 capsule 3   ibuprofen (ADVIL) 600 MG tablet TAKE 1 TABLET(600 MG) BY MOUTH EVERY 6 HOURS AS NEEDED 90 tablet 1   lactose free nutrition (BOOST) LIQD  Take 237 mLs by mouth as needed. Drinks as needed for no appetite (Patient not taking: Reported on 01/28/2021)      Multiple Vitamins-Minerals (PRESERVISION AREDS 2 PO) Take 1 tablet by mouth in the morning and at bedtime. (Patient not taking: Reported on 01/28/2021)     omeprazole (PRILOSEC) 20 MG capsule Take 20 mg by mouth at bedtime.      Polyethylene Glycol 3350 (MIRALAX PO) Take by mouth daily.     pyridostigmine (MESTINON) 60 MG tablet TAKE 0.5 TABLETS(30MG TOTAL) BY MOUTH 3 TIMES DAILY. 45 tablet 5   SAVELLA 50 MG TABS tablet Take 50 mg by mouth 2 (two) times daily.     solifenacin (VESICARE) 5 MG tablet Take 1 tablet (5 mg total) by mouth daily. 30 tablet 5   telmisartan-hydrochlorothiazide (MICARDIS HCT) 40-12.5 MG tablet Take 0.5 tablets by mouth daily. Patient needs appointment for further refills. 1 st attempt  DISCARD UNUSED 1/2 TABLET 90 tablet 0   traMADol (ULTRAM) 50 MG tablet Take 1 tablet (50 mg total) by mouth every 6 (six) hours as needed. 60 tablet 0   No current facility-administered medications for this visit.     ASSESSMENT & PLAN:   Assessment:   1.  Stage IA triple positive left breast cancer, diagnosed in May 2013.  She was treated with left mastectomy in June.  She was started on tamoxifene 20 mg daily in July 2013, but this was discontinued in July 2017 due to concerns regarding endometrial hyperplasia.  She completed one year of therapy with trastuzumab in September 2014.    2.  Recurrent breast cancer, February 2022, with evidence of left axillary and regional lymph nodes as well as bone lesions.  She was subsequently placed on anastrozole which she started on Febuary 28th.  Trastuzumab every 3 weeks was recommended and started on March 18th 2022.  She intends to keep her appointment for September 2nd.  Anastrozole has been discontinued.  3.  Significant elevation of the CA 27-29 measuring 221 in March, 288 in early April, 233 in June and 244 in July.  The latest value from August was 261, so it is slowly worsening.  4.  Lower extremity weakness and episodes of confusion,  worsening, being evaluated by neurology and felt to be neuropathy.  She is currently on Mestinon 0.5 mg TID. Home health is coming out to evaluate for any possible assistance available, especially with her medications and PT.  They are trying to figure out a way to bring her to the [facility] for future appointments.  5.  Osteoporosis of the forearm.  She received denosumab on March 3rd, but did not tolerate this therapy due to jaw pain, weakness and fatigue, and so it was discontinued.  We decided to place her on alendronate 70 mg weekly and tolerating this well.  We will continue the oral calcium supplement once daily, even though her level is mildly elevated, since she is on the alendronate.  6.  Status post splenectomy.  She has started vaccines and had a severe reaction with the meningitis vaccines.  We will not pursue further meningitis vaccines.  7.  Reaction to the meningitis vaccines.  Since both were given on same day, we don't know which one.  Will not administer any more.  8.  Worsening memory impairment.  She and her family are suspicious this represents possible medication effect.  We have eliminated anastrozole and will switch her to monthly fulvestrant injections.  We will schedule her  1st dose for September 2nd, when she comes in for her trastuzumab injection.    Plan: We discontinued anastrozole, but I doubt this is the issue.  I think the most likely that this represents after effects from her COVID infection, especially since her taste has still not recovered.  This is a lot for a woman of 87 years old who has metastatic cancer.  She will proceed with a 9th cycle of trastuzumab on Friday and we will administer her 1st dose of fulvestrant.  She knows to continue alendronate 70 mg weekly.  We will plan to see her back in 3 weeks with CBC, CMP and CA 27.29 prior to her 10th trastuzumab.  They will try to get her in here physically but we can change to a virtual appointment if needed.   We will continue to check a CEA and CA 27.29 every 3 weeks.  The patient voiced that she is ready if this is the end, but her daughter cut her off from talking about this.  I reassured the patient does not have to keep coming in if she does not wish to.  Both she and her daughter verbalize understanding of and agreement to the plans discussed today. They know to call the office should any new questions or concerns arise.    [name], MD [facility] AT [facility] [address] [place] [address] Dept: [phone] Dept Fax: [phone]

## 2021-03-06 ENCOUNTER — Telehealth: Payer: Self-pay

## 2021-03-06 ENCOUNTER — Other Ambulatory Visit: Payer: Self-pay | Admitting: Pharmacist

## 2021-03-06 ENCOUNTER — Other Ambulatory Visit: Payer: Self-pay

## 2021-03-06 ENCOUNTER — Other Ambulatory Visit: Payer: Self-pay | Admitting: Oncology

## 2021-03-06 DIAGNOSIS — C7951 Secondary malignant neoplasm of bone: Secondary | ICD-10-CM

## 2021-03-06 DIAGNOSIS — C50212 Malignant neoplasm of upper-inner quadrant of left female breast: Secondary | ICD-10-CM

## 2021-03-06 DIAGNOSIS — Z87440 Personal history of urinary (tract) infections: Secondary | ICD-10-CM

## 2021-03-06 DIAGNOSIS — N390 Urinary tract infection, site not specified: Secondary | ICD-10-CM

## 2021-03-06 NOTE — Telephone Encounter (Signed)
-----   Message from [name], MD sent at 03/06/2021  1:36 PM EDT ----- Regarding: RE: Treatment plan; noticing changes in brain function/confusion Contact: [phone] I called Amy Kline and rec she stop the AI now and would like to arrange Fulvestrant injections, perhaps we could start the first one next Friday when she gets her Herceptin, but then after that will be q 4 wks and the Herceptin q 3wks. ----- Message ----- From: [name], RN Sent: 03/06/2021  10:14 AM EDT To: [name], MD Subject: Treatment plan; noticing changes in brain fu#  Pt's daughter, Amy Kline, called to inquire if you had chance to discuss with your team and pharmacist regarding plans to change her moms treatment. She mentions she spoke with you last week. She also would like to report that "I'm noticing changes in moms brain/confusion pretty rapidly". She really prefers to speak with you, but I can definitely relay any message you would like.

## 2021-03-12 ENCOUNTER — Inpatient Hospital Stay (INDEPENDENT_AMBULATORY_CARE_PROVIDER_SITE_OTHER): Payer: Medicare Other | Admitting: Oncology

## 2021-03-12 ENCOUNTER — Other Ambulatory Visit: Payer: Self-pay | Admitting: Oncology

## 2021-03-12 ENCOUNTER — Encounter: Payer: Self-pay | Admitting: Oncology

## 2021-03-12 ENCOUNTER — Telehealth: Payer: Self-pay

## 2021-03-12 ENCOUNTER — Inpatient Hospital Stay: Payer: Medicare Other

## 2021-03-12 DIAGNOSIS — Z170 Estrogen receptor positive status [ER+]: Secondary | ICD-10-CM

## 2021-03-12 DIAGNOSIS — C7951 Secondary malignant neoplasm of bone: Secondary | ICD-10-CM

## 2021-03-12 DIAGNOSIS — C50212 Malignant neoplasm of upper-inner quadrant of left female breast: Secondary | ICD-10-CM | POA: Diagnosis not present

## 2021-03-12 NOTE — Telephone Encounter (Signed)
-----   Message from [name], MD sent at 03/12/2021  6:55 AM EDT ----- Yes, pls ask home health to draw CBC, CMP & CA 27.29 & ask schedulers to change to virtual visit ----- Message ----- From: Belva Chimes, LPN Sent: [id number]   3:41 PM EDT To: [name], MD  Daughter [name] called to inform us that her mom (the patient) is weak and that her mental status has declined and that she is having a hard time expressing her words. This has gotten worse in the last 3 weeks. Daughter states that OT, social work and other home health workers are coming out tomorrow to see if they have any ideas on how to get patient out of the house. Daughter also wants to know if maybe home health could draw labs and they do a virtual visit if they can not get patient out of the house safely.

## 2021-03-12 NOTE — Telephone Encounter (Signed)
Patient's daughter notified of the OV being changed to a virtual visit. Gave lab orders verbally  to [place], LPN with home health for CBC, CMP, and CA 27.29 per Dr. Remi Deter message. Also informed Shirlean Mylar, Education officer, museum with home health of the plan today. Scheduling and lab will be notified.

## 2021-03-12 NOTE — Telephone Encounter (Addendum)
Amy Kline, for Dr [name], is handling this call and orders.   Pt's daughter is asking if home health nurse could draw her moms labs and then do a telephone visit with Dr [name]. It is very difficult for them to get [name] out of the home.

## 2021-03-14 ENCOUNTER — Other Ambulatory Visit: Payer: Self-pay

## 2021-03-14 ENCOUNTER — Inpatient Hospital Stay: Payer: Medicare Other | Attending: Oncology

## 2021-03-14 VITALS — BP 171/80 | HR 77 | Temp 98.1°F | Resp 18 | Ht 65.5 in | Wt 172.0 lb

## 2021-03-14 DIAGNOSIS — R410 Disorientation, unspecified: Secondary | ICD-10-CM | POA: Diagnosis not present

## 2021-03-14 DIAGNOSIS — Z79818 Long term (current) use of other agents affecting estrogen receptors and estrogen levels: Secondary | ICD-10-CM | POA: Diagnosis present

## 2021-03-14 DIAGNOSIS — C7951 Secondary malignant neoplasm of bone: Secondary | ICD-10-CM | POA: Diagnosis not present

## 2021-03-14 DIAGNOSIS — Z5111 Encounter for antineoplastic chemotherapy: Secondary | ICD-10-CM | POA: Diagnosis not present

## 2021-03-14 DIAGNOSIS — Z79899 Other long term (current) drug therapy: Secondary | ICD-10-CM | POA: Diagnosis not present

## 2021-03-14 DIAGNOSIS — C50212 Malignant neoplasm of upper-inner quadrant of left female breast: Secondary | ICD-10-CM | POA: Insufficient documentation

## 2021-03-14 DIAGNOSIS — G629 Polyneuropathy, unspecified: Secondary | ICD-10-CM | POA: Diagnosis not present

## 2021-03-14 DIAGNOSIS — C773 Secondary and unspecified malignant neoplasm of axilla and upper limb lymph nodes: Secondary | ICD-10-CM | POA: Insufficient documentation

## 2021-03-14 DIAGNOSIS — Z7983 Long term (current) use of bisphosphonates: Secondary | ICD-10-CM | POA: Diagnosis not present

## 2021-03-14 DIAGNOSIS — Z9012 Acquired absence of left breast and nipple: Secondary | ICD-10-CM | POA: Insufficient documentation

## 2021-03-14 DIAGNOSIS — M810 Age-related osteoporosis without current pathological fracture: Secondary | ICD-10-CM | POA: Insufficient documentation

## 2021-03-14 MED ORDER — ACETAMINOPHEN 325 MG PO TABS
650.0000 mg | ORAL_TABLET | Freq: Once | ORAL | Status: AC
  Administered 2021-03-14: 650 mg via ORAL
  Filled 2021-03-14: qty 2

## 2021-03-14 MED ORDER — DIPHENHYDRAMINE HCL 25 MG PO CAPS
25.0000 mg | ORAL_CAPSULE | Freq: Once | ORAL | Status: AC
  Administered 2021-03-14: 25 mg via ORAL
  Filled 2021-03-14: qty 1

## 2021-03-14 MED ORDER — FULVESTRANT 250 MG/5ML IM SOLN
500.0000 mg | Freq: Once | INTRAMUSCULAR | Status: AC
  Administered 2021-03-14: 500 mg via INTRAMUSCULAR
  Filled 2021-03-14: qty 10

## 2021-03-14 MED ORDER — TRASTUZUMAB-HYALURONIDASE-OYSK 600-10000 MG-UNT/5ML ~~LOC~~ SOLN
600.0000 mg | Freq: Once | SUBCUTANEOUS | Status: AC
  Administered 2021-03-14: 600 mg via SUBCUTANEOUS
  Filled 2021-03-14: qty 5

## 2021-03-14 NOTE — Patient Instructions (Signed)
Trastuzumab; Hyaluronidase injection What is this medication? TRASTUZUMAB; HYALURONIDASE (tras TOO zoo mab / hye al ur ON i dase) is used to treat breast cancer and stomach cancer. Trastuzumab is a monoclonal antibody.Hyaluronidase is used to improve the effects of trastuzumab. This medicine may be used for other purposes; ask your health care provider orpharmacist if you have questions. COMMON BRAND NAME(S): HERCEPTIN HYLECTA What should I tell my care team before I take this medication? They need to know if you have any of these conditions: heart disease heart failure lung or breathing disease, like asthma an unusual or allergic reaction to trastuzumab, or other medications, foods, dyes, or preservatives pregnant or trying to get pregnant breast-feeding How should I use this medication? This medicine is for injection under the skin. It is given by a health careprofessional in a hospital or clinic setting. Talk to your pediatrician regarding the use of this medicine in children. Thismedicine is not approved for use in children. Overdosage: If you think you have taken too much of this medicine contact apoison control center or emergency room at once. NOTE: This medicine is only for you. Do not share this medicine with others. What if I miss a dose? It is important not to miss a dose. Call your doctor or health careprofessional if you are unable to keep an appointment. What may interact with this medication? This medicine may interact with the following medications: certain types of chemotherapy, such as daunorubicin, doxorubicin, epirubicin, and idarubicin This list may not describe all possible interactions. Give your health care provider a list of all the medicines, herbs, non-prescription drugs, or dietary supplements you use. Also tell them if you smoke, drink alcohol, or use illegaldrugs. Some items may interact with your medicine. What should I watch for while using this  medication? Visit your doctor for checks on your progress. Report any side effects. Continue your course of treatment even though you feel ill unless your doctortells you to stop. Call your doctor or health care professional for advice if you get a fever, chills or sore throat, or other symptoms of a cold or flu. Do not treatyourself. Try to avoid being around people who are sick. You may experience fever, chills and shaking during your first infusion. These effects are usually mild and can be treated with other medicines. Report any side effects during the infusion to your health care professional. Fever andchills usually do not happen with later infusions. Do not become pregnant while taking this medicine or for 7 months after stopping it. Women should inform their doctor if they wish to become pregnant or think they might be pregnant. Women of child-bearing potential will need to have a negative pregnancy test before starting this medicine. There is a potential for serious side effects to an unborn child. Talk to your health care professional or pharmacist for more information. Do not breast-feed an infantwhile taking this medicine or for 7 months after stopping it. What side effects may I notice from receiving this medication? Side effects that you should report to your doctor or health care professionalas soon as possible: allergic reactions like skin rash, itching or hives, swelling of the face, lips, or tongue breathing problems chest pain or palpitations cough fever general ill feeling or flu-like symptoms signs of worsening heart failure like breathing problems; swelling in your legs and feet Side effects that usually do not require medical attention (report these toyour doctor or health care professional if they continue or are bothersome): bone pain changes   in taste diarrhea joint pain nausea/vomiting unusually weak or tired weight loss This list may not describe all possible side  effects. Call your doctor for medical advice about side effects. You may report side effects to FDA at1-800-FDA-1088. Where should I keep my medication? This drug is given in a hospital or clinic and will not be stored at home. NOTE: This sheet is a summary. It may not cover all possible information. If you have questions about this medicine, talk to your doctor, pharmacist, orhealth care provider.  2022 Elsevier/Gold Standard (2017-09-17 21:54:17)  

## 2021-03-26 NOTE — Progress Notes (Signed)
Amy Kline  [address] [place],  [place]  [address] [phone]   Clinic Day:  04/02/2021  Referring physician: Raina Mina., MD  This document serves as a record of services personally performed by Hosie Poisson, MD. It was created on their behalf by [facility], a trained medical scribe. The creation of this record is based on the scribe's personal observations and the provider's statements to them.  CHIEF COMPLAINT:  CC: History of recurrent breast cancer   Current Treatment:  Trastuzumab every 3 weeks, Aledronate, and fulvestrant injections monthly  HISTORY OF PRESENT ILLNESS:  Amy Kline is a 87 y.o. female who presents for a transfer of care and continued management and evaluation of recurrent breast cancer.  She was originally diagnosed with a stage IA left breast cancer in May 2013 when biopsy confirmed invasive ductal carcinoma, grade 2.  Estrogen and progesterone receptors were positive at 100% and 96%, and HER2 was negative.  She was treated with left mastectomy in June and repeat HER2 was positive by [facility].  She followed with Dr. Lurline Del, and was started on tamoxifen 20 mg daily in July 2013, but this was discontinued in July 2017 due to concerns regarding endometrial hyperplasia.  She completed one year of therapy with trastuzumab in September 2014.  She did have some cardiotoxicity with this therapy, and so this had to be held this for 5-6 weeks.  However, in the summer of 2021 she started having trouble walking with generalized lower extremity weakness and lower back pain.  She also was having episodes of syncope.  MRI brain from August was negative, but MRI lumbar spine from December revealed STIR hyperintense foci at T12-L1 and S1.  MRI pelvis showed similar areas, and in addition, numerous enhancing marrow replacing lesions in the pelvis.  Bone marrow biopsy from January 2022 showed no evidence of carcinoma and a  normocellular marrow.  CT chest, abdomen and pelvis showed increased left axillary and subpectoral lymphadenopathy but no other evidence of metastatic disease.  She underwent left axillary lymph node biopsy on February 18th which confirmed triple positive carcinoma involving fatty tissue.  She was subsequently placed on anastrozole which she started on Febuary 28th, and denosumab was initiated on March 3rd.  Trastuzumab was recommended, and initiated on March 18th.  ECHO from September 2021 revealed an ejection fraction between 60-65%.  Tumor markers from January revealed a CA 27-29 of 87, a CEA of 6.65 and CA 19-9 was normal.  Recent CA 27-29 from March is significantly elevated at 221, and CEA was 10.12.  Staging PET imaging from March confirmed metastatic breast cancer as evidenced by hypermetabolic left subpectoral/left axillary lymph nodes and widespread hypermetabolic osseous lesions.  There is mild hypermetabolism within an nonenlarged subcarinal lymph node and both hilar regions.  She did receive Xgeva on March 3rd, but did not tolerate this and so it has been discontinued.  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  She is status post splenectomy, and has had both Prevnar 13 and pneumovax within the last few years.  She will be due for her next pneumovax in 2024.  She will need to get her meningococcal vaccines and hemophilus influenzae.  Bone density scan from March revealed osteoporosis with a T-score of -3.3 of the forearm, and right femur neck measures -2.0, considered osteopenic.  She did have her meningitis vaccine and experienced severe soreness which  is resolving. ECHO from June 30th revealed an EF between 55-60%.  She will be evaluated for home health assistance.  She was placed on alendronate 70 mg weekly.  We stopped the anastrozole and switched her to fulvestrant injections with the 1st one administered September 2nd.  INTERVAL HISTORY:  [name] is  here for follow up prior to a 10th cycle of trastuzumab and 2nd fulvestrant. She reports discomfort with the fulvestrant injections. She states that she continues physical therapy to help with her deconditioning. She does note some soreness from her exercises. She continues to have problems with confusion and her memory, but her daughter states that she is much better than she was since discontinuing anastrozole and Vesicare. She still has episodes of hallucinations, even last night where she thought she saw her mother. Her daughter feels she is 70-80% back to baseline. She notes bladder incontinence. Blood counts and chemistries are unremarkable except for a potassium of 3.3, a BUN of 23, and a calcium of 10.6, improved. Some of her symptoms could have been related to hypercalcemia, as her level did go up to 11.5. Her  appetite is good, and she has lost 1 1/2 pounds since her last visit.  She denies fever, chills or other signs of infection.  She denies nausea, vomiting, bowel issues, or abdominal pain.  She denies sore throat, cough, dyspnea, or chest pain.  Review of Systems  Constitutional: Negative.  Negative for appetite change, chills, fatigue, fever and unexpected weight change.  HENT:  Negative.    Eyes: Negative.   Respiratory: Negative.  Negative for chest tightness, cough, hemoptysis, shortness of breath and wheezing.   Cardiovascular: Negative.  Negative for chest pain, leg swelling and palpitations.  Gastrointestinal: Negative.  Negative for abdominal distention, abdominal pain, blood in stool, constipation, diarrhea, nausea and vomiting.  Endocrine: Negative.   Genitourinary:  Positive for bladder incontinence. Negative for difficulty urinating, dysuria, frequency and hematuria.   Musculoskeletal:  Positive for myalgias (due to physical therapy). Negative for arthralgias, back pain, flank pain and gait problem.  Skin: Negative.   Neurological:  Positive for extremity weakness (due to  deconditioning) and numbness (of the legs and feet). Negative for dizziness, gait problem, headaches, light-headedness, seizures and speech difficulty.  Hematological: Negative.   Psychiatric/Behavioral:  Positive for confusion (improved, but not back to baseline). Negative for depression and sleep disturbance. The patient is not nervous/anxious.     VITALS:  Blood pressure (!) 164/77, pulse 78, temperature 98.2 F (36.8 C), temperature source Oral, resp. rate 18, height 5' 5.5" (1.664 m), weight 167 lb 12.8 oz (76.1 kg), SpO2 96 %.  Wt Readings from Last 3 Encounters:  04/02/21 167 lb 12.8 oz (76.1 kg)  03/14/21 172 lb (78 kg)  02/21/21 172 lb 4 oz (78.1 kg)    Body mass index is 27.5 kg/m.  Performance status (ECOG): 2 - Symptomatic, <50% confined to bed  PHYSICAL EXAM:  Physical Exam Constitutional:      General: She is not in acute distress.    Appearance: Normal appearance. She is normal weight.  HENT:     Head: Normocephalic and atraumatic.  Eyes:     General: No scleral icterus.    Extraocular Movements: Extraocular movements intact.     Conjunctiva/sclera: Conjunctivae normal.     Pupils: Pupils are equal, round, and reactive to light.  Cardiovascular:     Rate and Rhythm: Normal rate and regular rhythm.     Pulses: Normal pulses.  Heart sounds: Normal heart sounds. No murmur heard.   No friction rub. No gallop.  Pulmonary:     Effort: Pulmonary effort is normal. No respiratory distress.     Breath sounds: Normal breath sounds.  Abdominal:     General: Bowel sounds are normal. There is no distension.     Palpations: Abdomen is soft. There is no hepatomegaly, splenomegaly or mass.     Tenderness: There is no abdominal tenderness.  Musculoskeletal:        General: Normal range of motion.     Cervical back: Normal range of motion and neck supple.     Right lower leg: No edema.     Left lower leg: No edema.  Lymphadenopathy:     Cervical: No cervical adenopathy.   Skin:    General: Skin is warm and dry.  Neurological:     General: No focal deficit present.     Mental Status: She is alert and oriented to person, place, and time. Mental status is at baseline.  Psychiatric:        Mood and Affect: Mood normal.        Behavior: Behavior normal.        Thought Content: Thought content normal.        Judgment: Judgment normal.    LABS:   CBC Latest Ref Rng & Units 04/02/2021 02/19/2021 01/28/2021  WBC - 8.6 8.5 7.4  Hemoglobin 12.0 - 16.0 14.1 13.9 13.6  Hematocrit 36 - 46 40 40 40  Platelets 150 - 399 282 308 337   CMP Latest Ref Rng & Units 04/02/2021 02/19/2021 01/28/2021  Glucose 70 - 99 mg/dL - - -  BUN 4 - 21 23(A) 22(A) 26(A)  Creatinine 0.5 - 1.1 0.8 0.8 0.8  Sodium 137 - 147 136(A) 135(A) 136(A)  Potassium 3.4 - 5.3 3.3(A) 3.4 3.6  Chloride 99 - 108 100 99 102  CO2 13 - 22 30(A) 29(A) 28(A)  Calcium 8.7 - 10.7 10.5 10.2 10.7  Total Protein 6.0 - 8.5 g/dL - - -  Total Bilirubin 0.3 - 1.2 mg/dL - - -  Alkaline Phos 25 - 125 129(A) 127(A) 124  AST 13 - 35 39(A) 36(A) 32  ALT 7 - 35 '20 20 19     ' Lab Results  Component Value Date   CEA1 8.3 (H) 02/19/2021   /  CEA  Date Value Ref Range Status  02/19/2021 8.3 (H) 0.0 - 4.7 ng/mL Final    Comment:    (NOTE)                             Nonsmokers          <3.9                             Smokers             <5.6 Roche [employer] Electrochemiluminescence Immunoassay (ECLIA) Values obtained with different assay methods or kits cannot be used interchangeably.  Results cannot be interpreted as absolute evidence of the presence or absence of malignant disease. Performed At: [facility] [place], [place] [id number] Rush Farmer MD JG:[id number]     Lab Results  Component Value Date   TDH741 23.7 07/22/2020     STUDIES:    HISTORY:   Allergies:  Allergies  Allergen Reactions   Neosporin [Neomycin-Polymyxin-Gramicidin] Rash  Sulfa Antibiotics  Hives    itching   Bacitracin-Polymyxin B    Bexsero [Meningococcal Grp B (Recomb) Vaccine] Other (See Comments)    Pt reported severe arthralgias and fatigue after 1st dose of bexsero and wishes to not receive again.   Cefdinir Nausea And Vomiting   Denosumab     Pain in jaw/head/mouth numb/weak   Other Other (See Comments)    Polysporin causes a rash and swelling Unknown   Statins     Current Medications: Current Outpatient Medications  Medication Sig Dispense Refill   alendronate (FOSAMAX) 70 MG tablet Take 1 tablet (70 mg total) by mouth once a week. Take with a full glass of water on an empty stomach. 12 tablet 3   aspirin 81 MG tablet Take 81 mg by mouth daily.     carvedilol (COREG) 12.5 MG tablet Take 0.5 tablets (6.25 mg total) by mouth 2 (two) times daily with a meal. 90 tablet 3   cyanocobalamin 1000 MCG tablet Take 1 tablet (1,000 mcg total) by mouth daily. 90 tablet 3   ergocalciferol (VITAMIN D2) 1.25 MG (50000 UT) capsule Vitamin D2 1,250 mcg (50,000 unit) capsule  TAKE 1 CAPSULE BY MOUTH ONCE A WEEK SAME DAY EACH WEEK 12 capsule 3   ibuprofen (ADVIL) 600 MG tablet TAKE 1 TABLET(600 MG) BY MOUTH EVERY 6 HOURS AS NEEDED 90 tablet 1   lactose free nutrition (BOOST) LIQD Take 237 mLs by mouth as needed. Drinks as needed for no appetite     omeprazole (PRILOSEC) 20 MG capsule Take 20 mg by mouth at bedtime.      Polyethylene Glycol 3350 (MIRALAX PO) Take by mouth daily.     pyridostigmine (MESTINON) 60 MG tablet TAKE 0.5 TABLETS(30MG TOTAL) BY MOUTH 3 TIMES DAILY. 45 tablet 5   SAVELLA 50 MG TABS tablet Take 50 mg by mouth 2 (two) times daily.     telmisartan-hydrochlorothiazide (MICARDIS HCT) 40-12.5 MG tablet Take 0.5 tablets by mouth daily. Patient needs appointment for further refills. 1 st attempt  DISCARD UNUSED 1/2 TABLET 90 tablet 0   traMADol (ULTRAM) 50 MG tablet Take 1 tablet (50 mg total) by mouth every 6 (six) hours as needed. 60 tablet 0   benzonatate (TESSALON)  100 MG capsule Take 1 capsule (100 mg total) by mouth 3 (three) times daily as needed for cough. (Patient not taking: Reported on 04/02/2021) 30 capsule 0   No current facility-administered medications for this visit.     ASSESSMENT & PLAN:   Assessment:   1.  Stage IA triple positive left breast cancer, diagnosed in May 2013.  She was treated with left mastectomy in June.  She was started on tamoxifene 20 mg daily in July 2013, but this was discontinued in July 2017 due to concerns regarding endometrial hyperplasia.  She completed one year of therapy with trastuzumab in September 2014.    2.  Recurrent breast cancer, February 2022, with evidence of left axillary and regional lymph nodes as well as bone lesions.  She was subsequently placed on anastrozole which she started on Febuary 28th.  Trastuzumab every 3 weeks was recommended and started on March 18th 2022. Anastrozole has been discontinued and she has been switched to fulvestrant injections.  3.  Significant elevation of the CA 27-29 measuring 221 in March, 288 in early April, 233 in June and 244 in July.  The latest value from September remains stable at 261.  4.  Lower extremity weakness and episodes of confusion,  worsening, being evaluated by neurology and felt to be neuropathy.  She is currently on Mestinon 0.5 mg TID. Home health is working with her for assistance, especially with her medications and PT.    5.  Osteoporosis of the forearm.  She received denosumab on March 3rd, but did not tolerate this therapy due to jaw pain, weakness and fatigue, and so it was discontinued.  We decided to place her on alendronate 70 mg weekly and tolerating this well.  She is no longer on oral calcium.  6.  Status post splenectomy.  She has started vaccines and had a severe reaction with the meningitis vaccines.  We will not pursue further meningitis vaccines.  7.  Reaction to the meningitis vaccines.  Since both were given on same day, we don't  know which one.  Will not administer any more.  8.  Worsening memory impairment.  She and her family are suspicious this represents possible medication effect.  We have eliminated anastrozole and switched her to monthly fulvestrant injections.  She received her 1st dose on September 2nd. She was somewhat reluctant to continue these injections but we will continue to push this. If she absolutely cannot tolerate it, we can decrease the dose to 250 mg monthly.     9. Hypercalcemia, improved. This may have contributed to her severe confusion a few weeks ago.  Plan: She will proceed with a 10th cycle of trastuzumab on Friday and we will administer her 2nd dose of fulvestrant on September 30th.  We will also plan for flu vaccine at that time. If she continues to have significant discomfort with fulvestrant, we could consider half dose. In regards to her confusion, she is doing better, but not completely back to baseline.  She will remain off anastrozole and Vesicare. She knows to continue alendronate 70 mg weekly.  We will plan to see her back in 3 weeks with CBC, CMP, CA 27.29 and ECHO prior to her 11th cycle of trastuzumab. We will check urinalysis and urine culture every 3 weeks for now. Both she and her daughter verbalize understanding of and agreement to the plans discussed today. They know to call the office should any new questions or concerns arise.   I provided 25 minutes of face-to-face time during this this encounter and > 50% was spent counseling as documented under my assessment and plan.    [name], MD [facility] AT [facility] [address] [place] [address] Dept: [phone] Dept Fax: [phone]    I, Rita Ohara, am acting as scribe for [name], MD  I have reviewed this report as typed by the medical scribe, and it is complete and accurate.

## 2021-04-02 ENCOUNTER — Inpatient Hospital Stay (INDEPENDENT_AMBULATORY_CARE_PROVIDER_SITE_OTHER): Payer: Medicare Other | Admitting: Oncology

## 2021-04-02 ENCOUNTER — Telehealth: Payer: Self-pay | Admitting: Oncology

## 2021-04-02 ENCOUNTER — Other Ambulatory Visit: Payer: Self-pay | Admitting: Oncology

## 2021-04-02 ENCOUNTER — Encounter: Payer: Self-pay | Admitting: Oncology

## 2021-04-02 ENCOUNTER — Inpatient Hospital Stay: Payer: Medicare Other

## 2021-04-02 ENCOUNTER — Other Ambulatory Visit: Payer: Self-pay | Admitting: Hematology and Oncology

## 2021-04-02 VITALS — BP 164/77 | HR 78 | Temp 98.2°F | Resp 18 | Ht 65.5 in | Wt 167.8 lb

## 2021-04-02 DIAGNOSIS — M810 Age-related osteoporosis without current pathological fracture: Secondary | ICD-10-CM | POA: Diagnosis not present

## 2021-04-02 DIAGNOSIS — C7951 Secondary malignant neoplasm of bone: Secondary | ICD-10-CM | POA: Diagnosis not present

## 2021-04-02 DIAGNOSIS — Z5111 Encounter for antineoplastic chemotherapy: Secondary | ICD-10-CM | POA: Diagnosis not present

## 2021-04-02 DIAGNOSIS — N390 Urinary tract infection, site not specified: Secondary | ICD-10-CM

## 2021-04-02 DIAGNOSIS — C50212 Malignant neoplasm of upper-inner quadrant of left female breast: Secondary | ICD-10-CM

## 2021-04-02 DIAGNOSIS — Z170 Estrogen receptor positive status [ER+]: Secondary | ICD-10-CM

## 2021-04-02 DIAGNOSIS — T451X5D Adverse effect of antineoplastic and immunosuppressive drugs, subsequent encounter: Secondary | ICD-10-CM

## 2021-04-02 DIAGNOSIS — C50912 Malignant neoplasm of unspecified site of left female breast: Secondary | ICD-10-CM

## 2021-04-02 LAB — CBC AND DIFFERENTIAL
HCT: 40 (ref 36–46)
Hemoglobin: 14.1 (ref 12.0–16.0)
Neutrophils Absolute: 4.56
Platelets: 282 (ref 150–399)
WBC: 8.6

## 2021-04-02 LAB — BASIC METABOLIC PANEL
BUN: 23 — AB (ref 4–21)
CO2: 30 — AB (ref 13–22)
Chloride: 100 (ref 99–108)
Creatinine: 0.8 (ref 0.5–1.1)
Glucose: 110
Potassium: 3.3 — AB (ref 3.4–5.3)
Sodium: 136 — AB (ref 137–147)

## 2021-04-02 LAB — COMPREHENSIVE METABOLIC PANEL
Albumin: 3.9 (ref 3.5–5.0)
Calcium: 10.5 (ref 8.7–10.7)

## 2021-04-02 LAB — CBC
MCV: 92 (ref 81–99)
RBC: 4.4 (ref 3.87–5.11)

## 2021-04-02 LAB — HEPATIC FUNCTION PANEL
ALT: 20 (ref 7–35)
AST: 39 — AB (ref 13–35)
Alkaline Phosphatase: 129 — AB (ref 25–125)
Bilirubin, Total: 0.5

## 2021-04-02 NOTE — Telephone Encounter (Signed)
Per 9/

## 2021-04-03 ENCOUNTER — Ambulatory Visit: Payer: Medicare Other

## 2021-04-03 ENCOUNTER — Other Ambulatory Visit: Payer: Self-pay | Admitting: Pharmacist

## 2021-04-03 LAB — CANCER ANTIGEN 27.29: CA 27.29: 299.9 U/mL — ABNORMAL HIGH (ref 0.0–38.6)

## 2021-04-03 MED FILL — Trastuzumab-Hyaluronidase-oysk Inj 600-10000 MG-Unit/5ML: SUBCUTANEOUS | Qty: 5 | Status: AC

## 2021-04-04 ENCOUNTER — Other Ambulatory Visit: Payer: Self-pay | Admitting: Cardiology

## 2021-04-04 ENCOUNTER — Inpatient Hospital Stay: Payer: Medicare Other

## 2021-04-04 ENCOUNTER — Other Ambulatory Visit: Payer: Self-pay

## 2021-04-04 VITALS — BP 161/84 | HR 68 | Temp 97.6°F | Resp 16 | Ht 65.0 in | Wt 169.0 lb

## 2021-04-04 DIAGNOSIS — C50212 Malignant neoplasm of upper-inner quadrant of left female breast: Secondary | ICD-10-CM

## 2021-04-04 DIAGNOSIS — Z5111 Encounter for antineoplastic chemotherapy: Secondary | ICD-10-CM | POA: Diagnosis not present

## 2021-04-04 DIAGNOSIS — C7951 Secondary malignant neoplasm of bone: Secondary | ICD-10-CM

## 2021-04-04 MED ORDER — TRASTUZUMAB-HYALURONIDASE-OYSK 600-10000 MG-UNT/5ML ~~LOC~~ SOLN
600.0000 mg | Freq: Once | SUBCUTANEOUS | Status: AC
  Administered 2021-04-04: 600 mg via SUBCUTANEOUS
  Filled 2021-04-04: qty 5

## 2021-04-04 MED ORDER — ACETAMINOPHEN 325 MG PO TABS: 650.0000 mg | ORAL_TABLET | Freq: Once | ORAL | Status: DC

## 2021-04-04 MED ORDER — DIPHENHYDRAMINE HCL 25 MG PO CAPS: 25.0000 mg | ORAL_CAPSULE | Freq: Once | ORAL | Status: DC

## 2021-04-04 NOTE — Progress Notes (Signed)
Discharged home, stable  

## 2021-04-04 NOTE — Patient Instructions (Signed)
[facility]  Discharge Instructions: Thank you for choosing [facility] to provide your oncology and hematology care.  If you have a lab appointment with the [facility], please go directly to the [facility] and check in at the registration area.   Wear comfortable clothing and clothing appropriate for easy access to any Portacath or PICC line.   We strive to give you quality time with your provider. You may need to reschedule your appointment if you arrive late (15 or more minutes).  Arriving late affects you and other patients whose appointments are after yours.  Also, if you miss three or more appointments without notifying the office, you may be dismissed from the clinic at the provider's discretion.      For prescription refill requests, have your pharmacy contact our office and allow 72 hours for refills to be completed.    Today you received the following chemotherapy and/or immunotherapy agents: Herceptin   To help prevent nausea and vomiting after your treatment, we encourage you to take your nausea medication as directed.  BELOW ARE SYMPTOMS THAT SHOULD BE REPORTED IMMEDIATELY: *FEVER GREATER THAN 100.4 F (38 C) OR HIGHER *CHILLS OR SWEATING *NAUSEA AND VOMITING THAT IS NOT CONTROLLED WITH YOUR NAUSEA MEDICATION *UNUSUAL SHORTNESS OF BREATH *UNUSUAL BRUISING OR BLEEDING *URINARY PROBLEMS (pain or burning when urinating, or frequent urination) *BOWEL PROBLEMS (unusual diarrhea, constipation, pain near the anus) TENDERNESS IN MOUTH AND THROAT WITH OR WITHOUT PRESENCE OF ULCERS (sore throat, sores in mouth, or a toothache) UNUSUAL RASH, SWELLING OR PAIN  UNUSUAL VAGINAL DISCHARGE OR ITCHING   Items with * indicate a potential emergency and should be followed up as soon as possible or go to the Emergency Department if any problems should occur.  Please show the CHEMOTHERAPY ALERT CARD or IMMUNOTHERAPY ALERT CARD at check-in to the  Emergency Department and triage nurse.  Should you have questions after your visit or need to cancel or reschedule your appointment, please contact [facility]  Dept: [phone]  and follow the prompts.  Office hours are 8:00 a.m. to 4:30 p.m. Monday - Friday. Please note that voicemails left after 4:00 p.m. may not be returned until the following business day.  We are closed weekends and major holidays. You have access to a nurse at all times for urgent questions. Please call the main number to the clinic Dept: [phone] and follow the prompts.  For any non-urgent questions, you may also contact your provider using MyChart. We now offer e-Visits for anyone 68 and older to request care online for non-urgent symptoms. For details visit [email or web address].   Also download the MyChart app! Go to the app store, search "MyChart", open the app, select [facility], and log in with your MyChart username and password.  Due to Covid, a mask is required upon entering the hospital/clinic. If you do not have a mask, one will be given to you upon arrival. For doctor visits, patients may have 1 support person aged 20 or older with them. For treatment visits, patients cannot have anyone with them due to current Covid guidelines and our immunocompromised population.   Trastuzumab; Hyaluronidase injection What is this medication? TRASTUZUMAB; HYALURONIDASE (tras TOO zoo mab / hye al ur ON i dase) is used to treat breast cancer and stomach cancer. Trastuzumab is a monoclonal antibody. Hyaluronidase is used to improve the effects of trastuzumab. This medicine may be used for other purposes; ask your health  care provider or pharmacist if you have questions. COMMON BRAND NAME(S): HERCEPTIN HYLECTA What should I tell my care team before I take this medication? They need to know if you have any of these conditions: heart disease heart failure lung or breathing disease, like asthma an  unusual or allergic reaction to trastuzumab, or other medications, foods, dyes, or preservatives pregnant or trying to get pregnant breast-feeding How should I use this medication? This medicine is for injection under the skin. It is given by a health care professional in a hospital or clinic setting. Talk to your pediatrician regarding the use of this medicine in children. This medicine is not approved for use in children. Overdosage: If you think you have taken too much of this medicine contact a poison control center or emergency room at once. NOTE: This medicine is only for you. Do not share this medicine with others. What if I miss a dose? It is important not to miss a dose. Call your doctor or health care professional if you are unable to keep an appointment. What may interact with this medication? This medicine may interact with the following medications: certain types of chemotherapy, such as daunorubicin, doxorubicin, epirubicin, and idarubicin This list may not describe all possible interactions. Give your health care provider a list of all the medicines, herbs, non-prescription drugs, or dietary supplements you use. Also tell them if you smoke, drink alcohol, or use illegal drugs. Some items may interact with your medicine. What should I watch for while using this medication? Visit your doctor for checks on your progress. Report any side effects. Continue your course of treatment even though you feel ill unless your doctor tells you to stop. Call your doctor or health care professional for advice if you get a fever, chills or sore throat, or other symptoms of a cold or flu. Do not treat yourself. Try to avoid being around people who are sick. You may experience fever, chills and shaking during your first infusion. These effects are usually mild and can be treated with other medicines. Report any side effects during the infusion to your health care professional. Fever and chills usually do  not happen with later infusions. Do not become pregnant while taking this medicine or for 7 months after stopping it. Women should inform their doctor if they wish to become pregnant or think they might be pregnant. Women of child-bearing potential will need to have a negative pregnancy test before starting this medicine. There is a potential for serious side effects to an unborn child. Talk to your health care professional or pharmacist for more information. Do not breast-feed an infant while taking this medicine or for 7 months after stopping it. What side effects may I notice from receiving this medication? Side effects that you should report to your doctor or health care professional as soon as possible: allergic reactions like skin rash, itching or hives, swelling of the face, lips, or tongue breathing problems chest pain or palpitations cough fever general ill feeling or flu-like symptoms signs of worsening heart failure like breathing problems; swelling in your legs and feet Side effects that usually do not require medical attention (report these to your doctor or health care professional if they continue or are bothersome): bone pain changes in taste diarrhea joint pain nausea/vomiting unusually weak or tired weight loss This list may not describe all possible side effects. Call your doctor for medical advice about side effects. You may report side effects to FDA at 1-800-FDA-1088. Where  should I keep my medication? This drug is given in a hospital or clinic and will not be stored at home. NOTE: This sheet is a summary. It may not cover all possible information. If you have questions about this medicine, talk to your doctor, pharmacist, or health care provider.  2022 Elsevier/Gold Standard (2017-09-17 21:54:17)

## 2021-04-04 NOTE — Progress Notes (Signed)
Attempted to obtain urine specimen, patient unable to void. Daughter states this is "preventative"and patient is not having UTI signs/symptoms at this time. States they will have home health or Dr. Willette Pa office to obtain at next visit.

## 2021-04-04 NOTE — Progress Notes (Signed)
Patient and daughter opted for patient not to receive flu shot at this time.

## 2021-04-06 ENCOUNTER — Encounter: Payer: Self-pay | Admitting: Oncology

## 2021-04-07 ENCOUNTER — Telehealth: Payer: Self-pay

## 2021-04-07 NOTE — Telephone Encounter (Signed)
Pt's daughter, [name], called to ask if you could call her and just explain why her moms CA 25-27 went up. [name] asking if it is because of the change in medication? I just would like to speak to her to get her take on things.  [phone]

## 2021-04-10 ENCOUNTER — Inpatient Hospital Stay: Payer: Medicare Other

## 2021-04-10 ENCOUNTER — Other Ambulatory Visit: Payer: Self-pay | Admitting: Pharmacist

## 2021-04-10 VITALS — BP 170/77 | HR 76 | Temp 97.8°F | Resp 18 | Ht 65.0 in | Wt 169.0 lb

## 2021-04-10 DIAGNOSIS — Z5111 Encounter for antineoplastic chemotherapy: Secondary | ICD-10-CM | POA: Diagnosis not present

## 2021-04-10 DIAGNOSIS — C50212 Malignant neoplasm of upper-inner quadrant of left female breast: Secondary | ICD-10-CM

## 2021-04-10 MED ORDER — FULVESTRANT 250 MG/5ML IM SOLN
500.0000 mg | Freq: Once | INTRAMUSCULAR | Status: AC
  Administered 2021-04-10: 500 mg via INTRAMUSCULAR
  Filled 2021-04-10: qty 10

## 2021-04-10 NOTE — Patient Instructions (Signed)
Fulvestrant injection What is this medication? FULVESTRANT (ful VES trant) blocks the effects of estrogen. It is used to treat breast cancer. This medicine may be used for other purposes; ask your health care provider or pharmacist if you have questions. COMMON BRAND NAME(S): FASLODEX What should I tell my care team before I take this medication? They need to know if you have any of these conditions: bleeding disorders liver disease low blood counts, like low white cell, platelet, or red cell counts an unusual or allergic reaction to fulvestrant, other medicines, foods, dyes, or preservatives pregnant or trying to get pregnant breast-feeding How should I use this medication? This medicine is for injection into a muscle. It is usually given by a health care professional in a hospital or clinic setting. Talk to your pediatrician regarding the use of this medicine in children. Special care may be needed. Overdosage: If you think you have taken too much of this medicine contact a poison control center or emergency room at once. NOTE: This medicine is only for you. Do not share this medicine with others. What if I miss a dose? It is important not to miss your dose. Call your doctor or health care professional if you are unable to keep an appointment. What may interact with this medication? medicines that treat or prevent blood clots like warfarin, enoxaparin, dalteparin, apixaban, dabigatran, and rivaroxaban This list may not describe all possible interactions. Give your health care provider a list of all the medicines, herbs, non-prescription drugs, or dietary supplements you use. Also tell them if you smoke, drink alcohol, or use illegal drugs. Some items may interact with your medicine. What should I watch for while using this medication? Your condition will be monitored carefully while you are receiving this medicine. You will need important blood work done while you are taking this  medicine. Do not become pregnant while taking this medicine or for at least 1 year after stopping it. Women of child-bearing potential will need to have a negative pregnancy test before starting this medicine. Women should inform their doctor if they wish to become pregnant or think they might be pregnant. There is a potential for serious side effects to an unborn child. Men should inform their doctors if they wish to father a child. This medicine may lower sperm counts. Talk to your health care professional or pharmacist for more information. Do not breast-feed an infant while taking this medicine or for 1 year after the last dose. What side effects may I notice from receiving this medication? Side effects that you should report to your doctor or health care professional as soon as possible: allergic reactions like skin rash, itching or hives, swelling of the face, lips, or tongue feeling faint or lightheaded, falls pain, tingling, numbness, or weakness in the legs signs and symptoms of infection like fever or chills; cough; flu-like symptoms; sore throat vaginal bleeding Side effects that usually do not require medical attention (report to your doctor or health care professional if they continue or are bothersome): aches, pains constipation diarrhea headache hot flashes nausea, vomiting pain at site where injected stomach pain This list may not describe all possible side effects. Call your doctor for medical advice about side effects. You may report side effects to FDA at 1-800-FDA-1088. Where should I keep my medication? This drug is given in a hospital or clinic and will not be stored at home. NOTE: This sheet is a summary. It may not cover all possible information. If you have   questions about this medicine, talk to your doctor, pharmacist, or health care provider.  2022 Elsevier/Gold Standard (2017-10-07 11:34:41)  

## 2021-04-11 ENCOUNTER — Inpatient Hospital Stay: Payer: Medicare Other

## 2021-04-15 ENCOUNTER — Encounter: Payer: Self-pay | Admitting: Oncology

## 2021-04-16 ENCOUNTER — Ambulatory Visit: Payer: Medicare Other | Admitting: Neurology

## 2021-04-17 NOTE — Progress Notes (Signed)
[facility]  [address] [place],  [place]  [address] [phone]   Clinic Day:  04/23/2021  Referring physician: Raina Mina., MD  This document serves as a record of services personally performed by Hosie Poisson, MD. It was created on their behalf by [facility], a trained medical scribe. The creation of this record is based on the scribe's personal observations and the provider's statements to them.  CHIEF COMPLAINT:  CC: History of recurrent breast cancer   Current Treatment:  Trastuzumab every 3 weeks, Aledronate, and fulvestrant injections monthly  HISTORY OF PRESENT ILLNESS:  Amy Kline is a 87 y.o. female who presents for a transfer of care and continued management and evaluation of recurrent breast cancer.  She was originally diagnosed with a stage IA left breast cancer in May 2013 when biopsy confirmed invasive ductal carcinoma, grade 2.  Estrogen and progesterone receptors were positive at 100% and 96%, and HER2 was negative.  She was treated with left mastectomy in June and repeat HER2 was positive by [facility].  She followed with Dr. Lurline Del, and was started on tamoxifen 20 mg daily in July 2013, but this was discontinued in July 2017 due to concerns regarding endometrial hyperplasia.  She completed one year of therapy with trastuzumab in September 2014.  She did have some cardiotoxicity with this therapy, and so this had to be held this for 5-6 weeks.  However, in the summer of 2021 she started having trouble walking with generalized lower extremity weakness and lower back pain.  She also was having episodes of syncope.  MRI brain from August was negative, but MRI lumbar spine from December revealed STIR hyperintense foci at T12-L1 and S1.  MRI pelvis showed similar areas, and in addition, numerous enhancing marrow replacing lesions in the pelvis.  Bone marrow biopsy from January 2022 showed no evidence of carcinoma and a  normocellular marrow.  CT chest, abdomen and pelvis showed increased left axillary and subpectoral lymphadenopathy but no other evidence of metastatic disease.  She underwent left axillary lymph node biopsy on February 18th which confirmed triple positive carcinoma involving fatty tissue.  She was subsequently placed on anastrozole which she started on Febuary 28th, and denosumab was initiated on March 3rd.  Trastuzumab was recommended, and initiated on March 18th.  ECHO from September 2021 revealed an ejection fraction between 60-65%.  Tumor markers from January revealed a CA 27-29 of 87, a CEA of 6.65 and CA 19-9 was normal.  Recent CA 27-29 from March is significantly elevated at 221, and CEA was 10.12.  Staging PET imaging from March confirmed metastatic breast cancer as evidenced by hypermetabolic left subpectoral/left axillary lymph nodes and widespread hypermetabolic osseous lesions.  There is mild hypermetabolism within an nonenlarged subcarinal lymph node and both hilar regions.  She did receive Xgeva on March 3rd, but did not tolerate this and so it has been discontinued.  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  She is status post splenectomy, and has had both Prevnar 13 and pneumovax within the last few years.  She will be due for her next pneumovax in 2024.  She will need to get her meningococcal vaccines and hemophilus influenzae.  Bone density scan from March revealed osteoporosis with a T-score of -3.3 of the forearm, and right femur neck measures -2.0, considered osteopenic.  She did have her meningitis vaccine and experienced severe soreness which  is resolving. ECHO from June 30th revealed an EF between 55-60%.  She was placed on alendronate 70 mg weekly.  We stopped the anastrozole and switched her to fulvestrant injections with the 1st one administered September 2nd. New baseline CA 27.29 from September was 299.9, up from 261. She had some worsening  mental status changes which seemed to improve when the Vesicare was stopped. There may have been other factors involved such as mild hypercalcemia (up over 11.0), hormonal therapy, and her previous reaction to the meningitis vaccine.  INTERVAL HISTORY:  Amy Kline is here for routine follow up prior to an 11th cycle of trastuzumab and 3rd fulvestrant. She continues physical therapy, and is slowly improving. She still comes into the clinic in a wheelchair. She has been released from home health. In regards to her confusion, her daughter states that she is back to her baseline. Blood counts and chemistries are unremarkable except for a BUN of 21, and a mildly elevated calcium of 10.3, improved. Her  appetite is good, and her weight is stable since her last visit.  She denies fever, chills or other signs of infection.  She denies nausea, vomiting, bowel issues, or abdominal pain.  She denies sore throat, cough, dyspnea, or chest pain.  Review of Systems  Constitutional: Negative.  Negative for appetite change, chills, fatigue, fever and unexpected weight change.  HENT:  Negative.    Eyes: Negative.   Respiratory: Negative.  Negative for chest tightness, cough, hemoptysis, shortness of breath and wheezing.   Cardiovascular: Negative.  Negative for chest pain, leg swelling and palpitations.  Gastrointestinal: Negative.  Negative for abdominal distention, abdominal pain, blood in stool, constipation, diarrhea, nausea and vomiting.  Endocrine: Negative.   Genitourinary:  Negative for bladder incontinence, difficulty urinating, dysuria, frequency and hematuria.   Musculoskeletal:  Negative for arthralgias, back pain, flank pain, gait problem and myalgias.  Skin: Negative.   Neurological:  Positive for extremity weakness (due to deconditioning, in a wheelchair). Negative for dizziness, gait problem, headaches, light-headedness, numbness, seizures and speech difficulty.  Hematological: Negative.    Psychiatric/Behavioral:  Negative for confusion, depression and sleep disturbance. The patient is not nervous/anxious.     VITALS:  Blood pressure (!) 182/80, pulse 77, temperature 97.7 F (36.5 C), temperature source Oral, resp. rate 18, height _0  (1.651 m), weight 168 lb 8 oz (76.4 kg), SpO2 96 %.  Wt Readings from Last 3 Encounters:  04/23/21 168 lb 8 oz (76.4 kg)  04/10/21 169 lb (76.7 kg)  04/04/21 169 lb (76.7 kg)    Body mass index is 28.04 kg/m.  Performance status (ECOG): 1 - Symptomatic but completely ambulatory  PHYSICAL EXAM:  Physical Exam Constitutional:      General: She is not in acute distress.    Appearance: Normal appearance. She is normal weight.  HENT:     Head: Normocephalic and atraumatic.  Eyes:     General: No scleral icterus.    Extraocular Movements: Extraocular movements intact.     Conjunctiva/sclera: Conjunctivae normal.     Pupils: Pupils are equal, round, and reactive to light.  Cardiovascular:     Rate and Rhythm: Normal rate and regular rhythm.     Pulses: Normal pulses.     Heart sounds: Normal heart sounds. No murmur heard.   No friction rub. No gallop.  Pulmonary:     Effort: Pulmonary effort is normal. No respiratory distress.     Breath sounds: Normal breath sounds.  Chest:  Breasts:  Right: Normal.     Left: Absent.     Comments: Right breast is without masses. Thickening in the left upper chest, which is consistent with scar tissue. One area just above the left mastectomy incision and another area above that. Abdominal:     General: Bowel sounds are normal. There is no distension.     Palpations: Abdomen is soft. There is no hepatomegaly, splenomegaly or mass.     Tenderness: There is no abdominal tenderness.  Musculoskeletal:        General: Normal range of motion.     Cervical back: Normal range of motion and neck supple.     Right lower leg: No edema.     Left lower leg: No edema.  Lymphadenopathy:     Cervical: No  cervical adenopathy.  Skin:    General: Skin is warm and dry.  Neurological:     General: No focal deficit present.     Mental Status: She is alert and oriented to person, place, and time. Mental status is at baseline.  Psychiatric:        Mood and Affect: Mood normal.        Behavior: Behavior normal.        Thought Content: Thought content normal.        Judgment: Judgment normal.    LABS:   CBC Latest Ref Rng & Units 04/23/2021 04/02/2021 02/19/2021  WBC - 10.7 8.6 8.5  Hemoglobin 12.0 - 16.0 14.2 14.1 13.9  Hematocrit 36 - 46 41 40 40  Platelets 150 - 399 357 282 308   CMP Latest Ref Rng & Units 04/23/2021 04/02/2021 02/19/2021  Glucose 70 - 99 mg/dL - - -  BUN 4 - 21 21 23(A) 22(A)  Creatinine 0.5 - 1.1 0.9 0.8 0.8  Sodium 137 - 147 134(A) 136(A) 135(A)  Potassium 3.4 - 5.3 4.1 3.3(A) 3.4  Chloride 99 - 108 104 100 99  CO2 13 - 22 26(A) 30(A) 29(A)  Calcium 8.7 - 10.7 10.1 10.5 10.2  Total Protein 6.0 - 8.5 g/dL - - -  Total Bilirubin 0.3 - 1.2 mg/dL - - -  Alkaline Phos 25 - 125 146(A) 129(A) 127(A)  AST 13 - 35 32 39(A) 36(A)  ALT 7 - 35 _0 Lab Results  Component Value Date   CEA1 8.3 (H) 02/19/2021   /  CEA  Date Value Ref Range Status  02/19/2021 8.3 (H) 0.0 - 4.7 ng/mL Final    Comment:    (NOTE)                             Nonsmokers          <3.9                             Smokers             <5.6 Roche [employer] Electrochemiluminescence Immunoassay (ECLIA) Values obtained with different assay methods or kits cannot be used interchangeably.  Results cannot be interpreted as absolute evidence of the presence or absence of malignant disease. Performed At: [facility] [place], [place] [id number] Rush Farmer MD EV:[id number]     Lab Results  Component Value Date   EXH371 23.7 07/22/2020     STUDIES:    HISTORY:   Allergies:  Allergies  Allergen Reactions  Neosporin [Neomycin-Polymyxin-Gramicidin]  Rash   Sulfa Antibiotics Hives    itching   Bacitracin-Polymyxin B    Bexsero [Meningococcal Grp B (Recomb) Vaccine] Other (See Comments)    Pt reported severe arthralgias and fatigue after 1st dose of bexsero and wishes to not receive again.   Cefdinir Nausea And Vomiting   Denosumab     Pain in jaw/head/mouth numb/weak   Other Other (See Comments)    Polysporin causes a rash and swelling Unknown   Statins     Current Medications: Current Outpatient Medications  Medication Sig Dispense Refill   alendronate (FOSAMAX) 70 MG tablet Take 1 tablet (70 mg total) by mouth once a week. Take with a full glass of water on an empty stomach. 12 tablet 3   aspirin 81 MG tablet Take 81 mg by mouth daily.     benzonatate (TESSALON) 100 MG capsule Take 1 capsule (100 mg total) by mouth 3 (three) times daily as needed for cough. (Patient not taking: Reported on 04/02/2021) 30 capsule 0   carvedilol (COREG) 12.5 MG tablet TAKE ONE-HALF TABLET BY  MOUTH TWICE DAILY WITH A  MEAL 90 tablet 3   cyanocobalamin 1000 MCG tablet Take 1 tablet (1,000 mcg total) by mouth daily. 90 tablet 3   ergocalciferol (VITAMIN D2) 1.25 MG (50000 UT) capsule Vitamin D2 1,250 mcg (50,000 unit) capsule  TAKE 1 CAPSULE BY MOUTH ONCE A WEEK SAME DAY EACH WEEK 12 capsule 3   ibuprofen (ADVIL) 600 MG tablet TAKE 1 TABLET(600 MG) BY MOUTH EVERY 6 HOURS AS NEEDED 90 tablet 1   lactose free nutrition (BOOST) LIQD Take 237 mLs by mouth as needed. Drinks as needed for no appetite     omeprazole (PRILOSEC) 20 MG capsule Take 20 mg by mouth at bedtime.      Polyethylene Glycol 3350 (MIRALAX PO) Take by mouth daily.     pyridostigmine (MESTINON) 60 MG tablet TAKE 0.5 TABLETS(30MG TOTAL) BY MOUTH 3 TIMES DAILY. 45 tablet 5   SAVELLA 50 MG TABS tablet Take 50 mg by mouth 2 (two) times daily.     telmisartan-hydrochlorothiazide (MICARDIS HCT) 40-12.5 MG tablet Take 0.5 tablets by mouth daily. Patient needs appointment for further refills. 1 st  attempt  DISCARD UNUSED 1/2 TABLET 90 tablet 0   traMADol (ULTRAM) 50 MG tablet Take 1 tablet (50 mg total) by mouth every 6 (six) hours as needed. 60 tablet 0   No current facility-administered medications for this visit.     ASSESSMENT & PLAN:   Assessment:   1.  Stage IA triple positive left breast cancer, diagnosed in May 2013.  She was treated with left mastectomy in June.  She was started on tamoxifene 20 mg daily in July 2013, but this was discontinued in July 2017 due to concerns regarding endometrial hyperplasia.  She completed one year of therapy with trastuzumab in September 2014.    2.  Recurrent breast cancer, February 2022, with evidence of left axillary and regional lymph nodes as well as bone lesions.  She was subsequently placed on anastrozole which she started on Febuary 28th.  Trastuzumab every 3 weeks was recommended and started on March 18th 2022. Anastrozole has been discontinued and she has been switched to fulvestrant injections starting September 1st.  3.  Significant elevation of the CA 27-29 measuring 221 in March, 288 in early April, 233 in June and 244 in July.  In early September the CA 27.29 was 261 but this increased to 299.9 at  the end of the month.  4.  Lower extremity weakness and episodes of confusion, worsening, being evaluated by neurology and felt to be neuropathy.  She is currently on Mestinon 0.5 mg TID. Home health is working with her for assistance, especially with her medications and PT.    5.  Osteoporosis of the forearm.  She received denosumab on March 3rd, but did not tolerate this therapy due to jaw pain, weakness and fatigue, and so it was discontinued.  We decided to place her on alendronate 70 mg weekly and tolerating this well.  She is no longer on oral calcium.  6.  Status post splenectomy.  She has started vaccines and had a severe reaction with the meningitis vaccines.  We will not pursue further meningitis vaccines.  7.  Reaction to the  meningitis vaccines.  Since both were given on same day, we don't know which one.  Will not administer any more.  8. Hypercalcemia, mild and improved. This may have contributed to her severe confusion.  Plan: She will proceed with a 11th cycle of trastuzumab on Friday and we will administer her 3rd dose of fulvestrant on October 28th.  If she continues to have significant discomfort with fulvestrant, we could consider half dose, but I encouraged her to continue at the standard dose. In regards to her confusion, she is doing better, and is back to her baseline.  She will remain off anastrozole and Vesicare. She knows to continue alendronate 70 mg weekly.  As her ECHO was not scheduled on September 30th, we will schedule this now. Otherwise, we will plan to see her back in 3 weeks with CBC, CMP and CA 27.29 prior to her 12th cycle of trastuzumab. Both she and her daughter verbalize understanding of and agreement to the plans discussed today. They know to call the office should any new questions or concerns arise.   I provided 25 minutes of face-to-face time during this this encounter and > 50% was spent counseling as documented under my assessment and plan.    [name], MD [facility] AT [facility] [address] [place] [address] Dept: [phone] Dept Fax: [phone]    I, Rita Ohara, am acting as scribe for [name], MD  I have reviewed this report as typed by the medical scribe, and it is complete and accurate.

## 2021-04-18 ENCOUNTER — Telehealth: Payer: Self-pay

## 2021-04-18 NOTE — Telephone Encounter (Signed)
[name] from home health called to inform us that they want to re-eval patient for continuation of therapy in the home. They want to work on gate and balance. Their goal is 1x a week for 8 weeks. I gave a verbal to them. Just FYI.

## 2021-04-23 ENCOUNTER — Encounter: Payer: Self-pay | Admitting: Oncology

## 2021-04-23 ENCOUNTER — Inpatient Hospital Stay: Payer: Medicare Other | Attending: Oncology | Admitting: Oncology

## 2021-04-23 ENCOUNTER — Inpatient Hospital Stay: Payer: Medicare Other

## 2021-04-23 ENCOUNTER — Other Ambulatory Visit: Payer: Self-pay | Admitting: Oncology

## 2021-04-23 ENCOUNTER — Other Ambulatory Visit: Payer: Self-pay | Admitting: Hematology and Oncology

## 2021-04-23 VITALS — BP 182/80 | HR 77 | Temp 97.7°F | Resp 18 | Ht 65.0 in | Wt 168.5 lb

## 2021-04-23 DIAGNOSIS — C7951 Secondary malignant neoplasm of bone: Secondary | ICD-10-CM | POA: Diagnosis not present

## 2021-04-23 DIAGNOSIS — Z170 Estrogen receptor positive status [ER+]: Secondary | ICD-10-CM

## 2021-04-23 DIAGNOSIS — Z9012 Acquired absence of left breast and nipple: Secondary | ICD-10-CM | POA: Diagnosis not present

## 2021-04-23 DIAGNOSIS — N390 Urinary tract infection, site not specified: Secondary | ICD-10-CM

## 2021-04-23 DIAGNOSIS — C50212 Malignant neoplasm of upper-inner quadrant of left female breast: Secondary | ICD-10-CM | POA: Diagnosis present

## 2021-04-23 DIAGNOSIS — Z5111 Encounter for antineoplastic chemotherapy: Secondary | ICD-10-CM | POA: Insufficient documentation

## 2021-04-23 DIAGNOSIS — C773 Secondary and unspecified malignant neoplasm of axilla and upper limb lymph nodes: Secondary | ICD-10-CM | POA: Diagnosis not present

## 2021-04-23 DIAGNOSIS — Z7983 Long term (current) use of bisphosphonates: Secondary | ICD-10-CM | POA: Diagnosis not present

## 2021-04-23 DIAGNOSIS — M810 Age-related osteoporosis without current pathological fracture: Secondary | ICD-10-CM | POA: Insufficient documentation

## 2021-04-23 DIAGNOSIS — C50912 Malignant neoplasm of unspecified site of left female breast: Secondary | ICD-10-CM

## 2021-04-23 DIAGNOSIS — Z79818 Long term (current) use of other agents affecting estrogen receptors and estrogen levels: Secondary | ICD-10-CM | POA: Insufficient documentation

## 2021-04-23 LAB — CBC: RBC: 4.52 (ref 3.87–5.11)

## 2021-04-23 LAB — HEPATIC FUNCTION PANEL
ALT: 18 (ref 7–35)
AST: 32 (ref 13–35)
Alkaline Phosphatase: 146 — AB (ref 25–125)
Bilirubin, Total: 0.5

## 2021-04-23 LAB — BASIC METABOLIC PANEL
BUN: 21 (ref 4–21)
CO2: 26 — AB (ref 13–22)
Chloride: 104 (ref 99–108)
Creatinine: 0.9 (ref 0.5–1.1)
Glucose: 104
Potassium: 4.1 (ref 3.4–5.3)
Sodium: 134 — AB (ref 137–147)

## 2021-04-23 LAB — CBC AND DIFFERENTIAL
HCT: 41 (ref 36–46)
Hemoglobin: 14.2 (ref 12.0–16.0)
Neutrophils Absolute: 5.78
Platelets: 357 (ref 150–399)
WBC: 10.7

## 2021-04-23 LAB — COMPREHENSIVE METABOLIC PANEL
Albumin: 3.8 (ref 3.5–5.0)
Calcium: 10.1 (ref 8.7–10.7)

## 2021-04-24 MED FILL — Trastuzumab-Hyaluronidase-oysk Inj 600-10000 MG-Unit/5ML: SUBCUTANEOUS | Qty: 5 | Status: AC

## 2021-04-25 ENCOUNTER — Inpatient Hospital Stay: Payer: Medicare Other

## 2021-04-25 ENCOUNTER — Other Ambulatory Visit: Payer: Self-pay | Admitting: Pharmacist

## 2021-04-25 ENCOUNTER — Other Ambulatory Visit: Payer: Self-pay

## 2021-04-25 VITALS — BP 151/79 | HR 81 | Temp 97.5°F | Resp 18 | Ht 65.0 in

## 2021-04-25 DIAGNOSIS — Z5111 Encounter for antineoplastic chemotherapy: Secondary | ICD-10-CM | POA: Diagnosis not present

## 2021-04-25 DIAGNOSIS — C50212 Malignant neoplasm of upper-inner quadrant of left female breast: Secondary | ICD-10-CM

## 2021-04-25 DIAGNOSIS — C7951 Secondary malignant neoplasm of bone: Secondary | ICD-10-CM

## 2021-04-25 LAB — CANCER ANTIGEN 27.29: CA 27.29: 307.2 U/mL — ABNORMAL HIGH (ref 0.0–38.6)

## 2021-04-25 MED ORDER — ACETAMINOPHEN 325 MG PO TABS: 650.0000 mg | ORAL_TABLET | Freq: Once | ORAL | Status: DC

## 2021-04-25 MED ORDER — TRASTUZUMAB-HYALURONIDASE-OYSK 600-10000 MG-UNT/5ML ~~LOC~~ SOLN
600.0000 mg | Freq: Once | SUBCUTANEOUS | Status: AC
  Administered 2021-04-25: 600 mg via SUBCUTANEOUS
  Filled 2021-04-25: qty 5

## 2021-04-25 MED ORDER — DIPHENHYDRAMINE HCL 25 MG PO CAPS: 25.0000 mg | ORAL_CAPSULE | Freq: Once | ORAL | Status: DC

## 2021-04-25 NOTE — Patient Instructions (Signed)
[facility]  Discharge Instructions: Thank you for choosing [facility] to provide your oncology and hematology care.  If you have a lab appointment with the [facility], please go directly to the [facility] and check in at the registration area.   Wear comfortable clothing and clothing appropriate for easy access to any Portacath or PICC line.   We strive to give you quality time with your provider. You may need to reschedule your appointment if you arrive late (15 or more minutes).  Arriving late affects you and other patients whose appointments are after yours.  Also, if you miss three or more appointments without notifying the office, you may be dismissed from the clinic at the provider's discretion.      For prescription refill requests, have your pharmacy contact our office and allow 72 hours for refills to be completed.    Today you received the following chemotherapy and/or immunotherapy agents,   To help prevent nausea and vomiting after your treatment, we encourage you to take your nausea medication as directed.  BELOW ARE SYMPTOMS THAT SHOULD BE REPORTED IMMEDIATELY: *FEVER GREATER THAN 100.4 F (38 C) OR HIGHER *CHILLS OR SWEATING *NAUSEA AND VOMITING THAT IS NOT CONTROLLED WITH YOUR NAUSEA MEDICATION *UNUSUAL SHORTNESS OF BREATH *UNUSUAL BRUISING OR BLEEDING *URINARY PROBLEMS (pain or burning when urinating, or frequent urination) *BOWEL PROBLEMS (unusual diarrhea, constipation, pain near the anus) TENDERNESS IN MOUTH AND THROAT WITH OR WITHOUT PRESENCE OF ULCERS (sore throat, sores in mouth, or a toothache) UNUSUAL RASH, SWELLING OR PAIN  UNUSUAL VAGINAL DISCHARGE OR ITCHING   Items with * indicate a potential emergency and should be followed up as soon as possible or go to the Emergency Department if any problems should occur.  Please show the CHEMOTHERAPY ALERT CARD or IMMUNOTHERAPY ALERT CARD at check-in to the Emergency  Department and triage nurse.  Should you have questions after your visit or need to cancel or reschedule your appointment, please contact [facility]  Dept: [phone]  and follow the prompts.  Office hours are 8:00 a.m. to 4:30 p.m. Monday - Friday. Please note that voicemails left after 4:00 p.m. may not be returned until the following business day.  We are closed weekends and major holidays. You have access to a nurse at all times for urgent questions. Please call the main number to the clinic Dept: [phone] and follow the prompts.  For any non-urgent questions, you may also contact your provider using MyChart. We now offer e-Visits for anyone 72 and older to request care online for non-urgent symptoms. For details visit [email or web address].   Also download the MyChart app! Go to the app store, search "MyChart", open the app, select [facility], and log in with your MyChart username and password.  Due to Covid, a mask is required upon entering the hospital/clinic. If you do not have a mask, one will be given to you upon arrival. For doctor visits, patients may have 1 support person aged 63 or older with them. For treatment visits, patients cannot have anyone with them due to current Covid guidelines and our immunocompromised population.

## 2021-04-25 NOTE — Progress Notes (Signed)
Patient states that she does not feel well today-Denies pain, nausea, vomiting. States that she feels extra tired. Discharged home, daughter with patient.

## 2021-04-26 ENCOUNTER — Emergency Department (HOSPITAL_BASED_OUTPATIENT_CLINIC_OR_DEPARTMENT_OTHER): Payer: Medicare Other

## 2021-04-26 ENCOUNTER — Other Ambulatory Visit: Payer: Self-pay

## 2021-04-26 ENCOUNTER — Emergency Department (HOSPITAL_BASED_OUTPATIENT_CLINIC_OR_DEPARTMENT_OTHER)
Admission: EM | Admit: 2021-04-26 | Discharge: 2021-04-27 | Disposition: A | Discharge status: Home / Self Care | Payer: Medicare Other | Attending: Emergency Medicine | Admitting: Emergency Medicine

## 2021-04-26 ENCOUNTER — Encounter (HOSPITAL_BASED_OUTPATIENT_CLINIC_OR_DEPARTMENT_OTHER): Payer: Self-pay

## 2021-04-26 DIAGNOSIS — I10 Essential (primary) hypertension: Secondary | ICD-10-CM | POA: Diagnosis not present

## 2021-04-26 DIAGNOSIS — Z853 Personal history of malignant neoplasm of breast: Secondary | ICD-10-CM | POA: Insufficient documentation

## 2021-04-26 DIAGNOSIS — Z7982 Long term (current) use of aspirin: Secondary | ICD-10-CM | POA: Diagnosis not present

## 2021-04-26 DIAGNOSIS — N3000 Acute cystitis without hematuria: Secondary | ICD-10-CM | POA: Insufficient documentation

## 2021-04-26 DIAGNOSIS — R55 Syncope and collapse: Secondary | ICD-10-CM | POA: Diagnosis present

## 2021-04-26 LAB — CBC WITH DIFFERENTIAL/PLATELET
Abs Immature Granulocytes: 0.07 10*3/uL (ref 0.00–0.07)
Basophils Absolute: 0.1 10*3/uL (ref 0.0–0.1)
Basophils Relative: 1 %
Eosinophils Absolute: 0.4 10*3/uL (ref 0.0–0.5)
Eosinophils Relative: 3 %
HCT: 43.1 % (ref 36.0–46.0)
Hemoglobin: 15 g/dL (ref 12.0–15.0)
Immature Granulocytes: 1 %
Lymphocytes Relative: 25 %
Lymphs Abs: 3.5 10*3/uL (ref 0.7–4.0)
MCH: 31.6 pg (ref 26.0–34.0)
MCHC: 34.8 g/dL (ref 30.0–36.0)
MCV: 90.9 fL (ref 80.0–100.0)
Monocytes Absolute: 1.5 10*3/uL — ABNORMAL HIGH (ref 0.1–1.0)
Monocytes Relative: 11 %
Neutro Abs: 8.7 10*3/uL — ABNORMAL HIGH (ref 1.7–7.7)
Neutrophils Relative %: 59 %
Platelets: 277 10*3/uL (ref 150–400)
RBC: 4.74 MIL/uL (ref 3.87–5.11)
RDW: 14.3 % (ref 11.5–15.5)
WBC: 14.3 10*3/uL — ABNORMAL HIGH (ref 4.0–10.5)
nRBC: 0 % (ref 0.0–0.2)

## 2021-04-26 LAB — MAGNESIUM: Magnesium: 2 mg/dL (ref 1.7–2.4)

## 2021-04-26 LAB — URINALYSIS, COMPLETE (UACMP) WITH MICROSCOPIC
Bilirubin Urine: NEGATIVE
Glucose, UA: NEGATIVE mg/dL
Hgb urine dipstick: NEGATIVE
Ketones, ur: NEGATIVE mg/dL
Nitrite: NEGATIVE
Protein, ur: NEGATIVE mg/dL
Specific Gravity, Urine: 1.025 (ref 1.005–1.030)
pH: 7 (ref 5.0–8.0)

## 2021-04-26 LAB — COMPREHENSIVE METABOLIC PANEL
ALT: 18 U/L (ref 0–44)
AST: 29 U/L (ref 15–41)
Albumin: 3.8 g/dL (ref 3.5–5.0)
Alkaline Phosphatase: 119 U/L (ref 38–126)
Anion gap: 10 (ref 5–15)
BUN: 22 mg/dL (ref 8–23)
CO2: 27 mmol/L (ref 22–32)
Calcium: 10.6 mg/dL — ABNORMAL HIGH (ref 8.9–10.3)
Chloride: 99 mmol/L (ref 98–111)
Creatinine, Ser: 0.9 mg/dL (ref 0.44–1.00)
GFR, Estimated: >60 mL/min (ref >60)
Glucose, Bld: 100 mg/dL — ABNORMAL HIGH (ref 70–99)
Potassium: 4.3 mmol/L (ref 3.5–5.1)
Sodium: 136 mmol/L (ref 135–145)
Total Bilirubin: 0.6 mg/dL (ref 0.3–1.2)
Total Protein: 7.7 g/dL (ref 6.5–8.1)

## 2021-04-26 LAB — TROPONIN I (HIGH SENSITIVITY)
Troponin I (High Sensitivity): 4 ng/L (ref <18)
Troponin I (High Sensitivity): 7 ng/L (ref <18)

## 2021-04-26 LAB — LACTIC ACID, PLASMA: Lactic Acid, Venous: 1.4 mmol/L (ref 0.5–1.9)

## 2021-04-26 LAB — CBG MONITORING, ED: Glucose-Capillary: 103 mg/dL — ABNORMAL HIGH (ref 70–99)

## 2021-04-26 MED ORDER — CARVEDILOL 12.5 MG PO TABS
12.5000 mg | ORAL_TABLET | Freq: Two times a day (BID) | ORAL | Status: DC
  Administered 2021-04-26: 12.5 mg via ORAL
  Filled 2021-04-26: qty 1

## 2021-04-26 MED ORDER — SODIUM CHLORIDE 0.9 % IV SOLN
1.0000 g | Freq: Once | INTRAVENOUS | Status: AC
  Administered 2021-04-26: 1 g via INTRAVENOUS
  Filled 2021-04-26: qty 10

## 2021-04-26 MED ORDER — HYDRALAZINE HCL 20 MG/ML IJ SOLN
5.0000 mg | Freq: Once | INTRAMUSCULAR | Status: AC
  Administered 2021-04-26: 5 mg via INTRAVENOUS
  Filled 2021-04-26: qty 1
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
## 2021-04-26 NOTE — ED Triage Notes (Signed)
Triage assisted by pt's daughter, who reports that pt was sitting at breakfast table this morning and had syncopal episode when standing. Daughter also reports pt had issues with her SBP being >200 last night. Pt with some confusion during triage, which is not her baseline per daughter. Pt is on chemotherapy infusions for breast cancer, most recent tx yesterday.

## 2021-04-27 ENCOUNTER — Telehealth: Payer: Self-pay | Admitting: Student

## 2021-04-27 MED ORDER — CIPROFLOXACIN HCL 250 MG PO TABS: 250.0000 mg | ORAL_TABLET | Freq: Two times a day (BID) | ORAL | 0 refills | Status: AC

## 2021-04-27 NOTE — Telephone Encounter (Signed)
    The patient's daughter called the after-hours line reporting her mother's blood pressure has been variable over the past few days. She did have a fall yesterday and was evaluated in the ED and BP was initially elevated but had improved at the time of discharge following administration of IV Hydralazine. Was also diagnosed with a UTI at that time.   She is unsure of her exact blood pressure readings this afternoon but feels like her SBP was in the 170's. They are planning to go visit her mom later this evening. In reviewing her medication regimen, she is taking Coreg 6.25 mg twice daily and half a tablet of Telmisartan-HCTZ. I encouraged them to recheck her blood pressure upon arrival and if SBP is still greater than 150, go ahead and give 12.5 mg of Coreg this evening as she was on 12.5 mg twice daily in the past. I encouraged them to keep a blood pressure log and call the office with readings later this week. She may ultimately require dose adjustment of Coreg to her prior dosing of 12.5 mg twice daily or further adjustment of Telmisartan-HCTZ but did not adjust at this time given her current UTI.   She voiced understanding of this and was appreciative of the return call.  Signed, Erma Heritage, PA-C 04/27/2021, 5:04 PM

## 2021-04-27 NOTE — Discharge Instructions (Addendum)
Amy Kline was seen in the ER today after her fall and questionable syncope.  Her physical exam and vital signs were very reassuring as was her blood work and EKG.  She was treated for urinary tract infection has been prescribed antibiotics to take for her urinary tract infection as well.  Please take these as prescribed for entire course and follow-up with your primary care doctor in 1 week.  Return to the ER with any new or severe symptoms.

## 2021-04-28 ENCOUNTER — Telehealth: Payer: Self-pay | Admitting: Oncology

## 2021-04-28 LAB — URINE CULTURE

## 2021-04-28 NOTE — Telephone Encounter (Signed)
Patient's daughter called to reschedule today's ECHO to next week - patient had a bad fall yesterday  ECHO rescheduled to 10/24 checking in at 1:15 pm.  Patient's daughter notified

## 2021-05-01 ENCOUNTER — Encounter: Payer: Self-pay | Admitting: Oncology

## 2021-05-01 ENCOUNTER — Telehealth: Payer: Self-pay

## 2021-05-01 LAB — CULTURE, BLOOD (ROUTINE X 2)
Culture: NO GROWTH
Culture: NO GROWTH

## 2021-05-01 NOTE — Telephone Encounter (Signed)
Daughter [name] states that patient took a fall on Saturday and was in the ER. Echo was scheduled for 04/28/21 but was rescheduled due to patient not feeling up to par. Rescheduled til 05/05/21. Daughter notified of lab results

## 2021-05-01 NOTE — Telephone Encounter (Signed)
-----   Message from [name], MD sent at 05/01/2021  7:02 AM EDT ----- Regarding: call Tell daughter that cancer marker went up a little more to 307, as expected. Has the ECHO been done/scheduled?
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
## 2021-05-02 ENCOUNTER — Ambulatory Visit (INDEPENDENT_AMBULATORY_CARE_PROVIDER_SITE_OTHER): Payer: Medicare Other

## 2021-05-02 ENCOUNTER — Other Ambulatory Visit: Payer: Self-pay

## 2021-05-02 ENCOUNTER — Encounter: Payer: Self-pay | Admitting: Cardiology

## 2021-05-02 ENCOUNTER — Ambulatory Visit (INDEPENDENT_AMBULATORY_CARE_PROVIDER_SITE_OTHER): Payer: Medicare Other | Admitting: Cardiology

## 2021-05-02 VITALS — BP 160/76 | HR 68 | Ht 65.0 in | Wt 163.0 lb

## 2021-05-02 DIAGNOSIS — I119 Hypertensive heart disease without heart failure: Secondary | ICD-10-CM | POA: Diagnosis not present

## 2021-05-02 DIAGNOSIS — R55 Syncope and collapse: Secondary | ICD-10-CM

## 2021-05-02 DIAGNOSIS — C50212 Malignant neoplasm of upper-inner quadrant of left female breast: Secondary | ICD-10-CM

## 2021-05-02 MED ORDER — CARVEDILOL 12.5 MG PO TABS: 12.5000 mg | ORAL_TABLET | Freq: Two times a day (BID) | ORAL | 3 refills | Status: AC

## 2021-05-02 MED ORDER — CARVEDILOL 12.5 MG PO TABS: 12.5000 mg | ORAL_TABLET | Freq: Every day | ORAL | 3 refills | Status: DC

## 2021-05-02 NOTE — Patient Instructions (Addendum)
Medication Instructions:  Your physician recommends that you continue on your current medications as directed. Please refer to the Current Medication list given to you today.  Please take your telmisartan mid-day.   Please check your blood pressure twice daily. Take once before taking your carvedilol and once one hour after taking it.   *If you need a refill on your cardiac medications before your next appointment, please call your pharmacy*   Lab Work: None If you have labs (blood work) drawn today and your tests are completely normal, you will receive your results only by: [place] (if you have MyChart) OR A paper copy in the mail If you have any lab test that is abnormal or we need to change your treatment, we will call you to review the results.   Testing/Procedures: A zio monitor was ordered today. It will remain on for 7 days. You will then return monitor and event diary in provided box. It takes 1-2 weeks for report to be downloaded and returned to [place]. We will call you with the results. If monitor falls off or has orange flashing light, please call Zio for further instructions.     Follow-Up: At [facility], you and your health needs are our priority.  As part of our continuing mission to provide you with exceptional heart care, we have created designated Provider Care Teams.  These Care Teams include your primary Cardiologist (physician) and Advanced Practice Providers (APPs -  Physician Assistants and Nurse Practitioners) who all work together to provide you with the care you need, when you need it.  We recommend signing up for the patient portal called "MyChart".  Sign up information is provided on this After Visit Summary.  MyChart is used to connect with patients for Virtual Visits (Telemedicine).  Patients are able to view lab/test results, encounter notes, upcoming appointments, etc.  Non-urgent messages can be sent to your provider as well.   To learn more about  what you can do with MyChart, go to [email or web address].    Your next appointment:   6 week(s)  The format for your next appointment:   In Person  Provider:   Shirlee More, MD   Other Instructions

## 2021-05-02 NOTE — Addendum Note (Signed)
Addended by: Resa Miner I on: 05/02/2021 11:45 AM   Modules accepted: Orders

## 2021-05-05 ENCOUNTER — Telehealth: Payer: Self-pay | Admitting: Oncology

## 2021-05-05 ENCOUNTER — Other Ambulatory Visit: Payer: Self-pay | Admitting: Oncology

## 2021-05-05 ENCOUNTER — Encounter: Payer: Self-pay | Admitting: Oncology

## 2021-05-05 ENCOUNTER — Telehealth: Payer: Self-pay

## 2021-05-05 DIAGNOSIS — N390 Urinary tract infection, site not specified: Secondary | ICD-10-CM

## 2021-05-05 NOTE — Telephone Encounter (Signed)
Patient rescheduled today's ECHO to 10/31 to check in at [facility] 1:15 pm - Ok per daughter

## 2021-05-05 NOTE — Telephone Encounter (Signed)
I spoke with [name] with [facility] and gave her orders for UA C&S per Dr [name]. RBV. She states she will try to get someone out there today to collect specimen.    RE: Cx ECHO appt today-feels bad. Think she still has UTI Received: Today [name], MD  [name], RN Phone Number: [phone]   We can resched ECHO, get Urine, consider urinary suppressive ab with mult recurrent UTI's    RE: Cx ECHO appt today-feels bad. Think she still has UTI Received: Today [name], MD  [name], RN Phone Number: [phone]   Yes, let's request UA, C&S, she has had mult UTI's     Pt's daughter,[name], called to report that her mom is cancelling her ECHO today. Mrs Flannagan states she cant go through getting ready and going out for testing. Pt feels like she may still have UTI. She reports her urine has foul odor. [name] is asking if we could send an order to [facility] to collect urine specimen. [facility] nursing has stopped coming,but [facility] is still working with her mom. The [facility] nurse told her if Dr [name] would order the UA, they would go out and get it.  I asked [name] if her mom had run any fevers or if she was having dysuria? [name] didn't think so, but gave me Mr Applewhites phone #, because he could answer those questions. I have called his # [phone]), but no answer.

## 2021-05-06 ENCOUNTER — Encounter: Payer: Self-pay | Admitting: Oncology

## 2021-05-06 NOTE — Addendum Note (Signed)
Addended by: Juanetta Beets on: 05/06/2021 02:44 PM   Modules accepted: Orders

## 2021-05-07 ENCOUNTER — Telehealth: Payer: Self-pay | Admitting: Cardiology

## 2021-05-07 DIAGNOSIS — R55 Syncope and collapse: Secondary | ICD-10-CM

## 2021-05-07 NOTE — Telephone Encounter (Signed)
Pt c/o BP issue: STAT if pt c/o blurred vision, one-sided weakness or slurred speech  1. What are your last 5 BP readings?  Lastnight- 161/135  Tuesday morning- 150/86 P 68 Tuesday night 198/99 P 75 Wednesday morning 166/89 P 67   2. Are you having any other symptoms (ex. Dizziness, headache, blurred vision, passed out)?  NO TO ALL  3. What is your BP issue? PT WAS ASKED TO CALL OUR OFFICE WITH HER BP READINGS

## 2021-05-08 ENCOUNTER — Telehealth: Payer: Self-pay

## 2021-05-08 MED ORDER — AMLODIPINE BESYLATE 2.5 MG PO TABS: 2.5000 mg | ORAL_TABLET | Freq: Every day | ORAL | 1 refills | Status: DC

## 2021-05-08 NOTE — Telephone Encounter (Signed)
Spoke to patient husband. Informed him to keep a log of patient bp sitting and standing and bring by in a week. He said the pressures below were from the first week. Also informed him to have patient start amlodipine 2.5 mg daily. He understood no further questions.

## 2021-05-08 NOTE — Telephone Encounter (Addendum)
I spoke with pt's dtr, [name] and notified her of Dr Remi Deter response below. She was appreciative of return call.  ----- Message from [name], MD sent at 05/07/2021  9:37 PM EDT ----- Regarding: RE: UA culture results Yes, the UA did not look suspicious, let daughter know ----- Message ----- From: [name], RN Sent: 05/07/2021  10:19 AM EDT To: [name], MD Subject: UA culture results                             Received call from [facility], with [facility] home [facility]. Pt's UA culture report shows no growth.

## 2021-05-08 NOTE — Telephone Encounter (Signed)
Left message for patient husband to return call.

## 2021-05-08 NOTE — Telephone Encounter (Signed)
I notified Gordy Councilman, of Dr Remi Deter response below    RE: [facility] nursing visit orders Received: Pattricia Boss, Chinita Pester, MD  [name], RN Absolutely yes    I received call from Gordy Councilman, [facility] home health nurse. She is requesting nursing visits weekly x 2, then every other week, and 4 PRN visits.

## 2021-05-08 NOTE — Progress Notes (Signed)
[facility]  [address] [place],  [place]  [address] [phone]  I connected with Shirah Bartoszek on 05/14/2021 at 2:30 PM EDT by telephone visit and verified that I am speaking with the correct person using two identifiers.   I discussed the limitations, risks, security and privacy concerns of performing an evaluation and management service by telemedicine and the availability of in-person appointments. I also discussed with the patient that there may be a patient responsible charge related to this service. The patient expressed understanding and agreed to proceed.   Other persons participating in the visit and their role in the encounter:  Daughter, [name] Patient's location:  Home Provider's location:  Office  Clinic Day:  05/14/2021  Referring physician: Raina Mina., MD  This document serves as a record of services personally performed by Hosie Poisson, MD. It was created on their behalf by [facility] E, a trained medical scribe. The creation of this record is based on the scribe's personal observations and the provider's statements to them.  CHIEF COMPLAINT:  CC: History of recurrent breast cancer   Current Treatment:  Trastuzumab every 3 weeks, Aledronate, and fulvestrant injections monthly  HISTORY OF PRESENT ILLNESS:  Amy Kline is a 87 y.o. female who presents for a transfer of care and continued management and evaluation of recurrent breast cancer.  She was originally diagnosed with a stage IA left breast cancer in May 2013 when biopsy confirmed invasive ductal carcinoma, grade 2.  Estrogen and progesterone receptors were positive at 100% and 96%, and HER2 was negative.  She was treated with left mastectomy in June and repeat HER2 was positive by [facility].  She followed with Dr. Lurline Del, and was started on tamoxifen 20 mg daily in July 2013, but this was discontinued in July 2017 due to concerns regarding endometrial  hyperplasia.  She completed one year of therapy with trastuzumab in September 2014.  She did have some cardiotoxicity with this therapy, and so this had to be held this for 5-6 weeks.  However, in the summer of 2021 she started having trouble walking with generalized lower extremity weakness and lower back pain.  She also was having episodes of syncope.  MRI brain from August was negative, but MRI lumbar spine from December revealed STIR hyperintense foci at T12-L1 and S1.  MRI pelvis showed similar areas, and in addition, numerous enhancing marrow replacing lesions in the pelvis.  Bone marrow biopsy from January 2022 showed no evidence of carcinoma and a normocellular marrow.  CT chest, abdomen and pelvis showed increased left axillary and subpectoral lymphadenopathy but no other evidence of metastatic disease.  She underwent left axillary lymph node biopsy on February 18th which confirmed triple positive carcinoma involving fatty tissue.  She was subsequently placed on anastrozole which she started on Febuary 28th, and denosumab was initiated on March 3rd.  Trastuzumab was recommended, and initiated on March 18th.  ECHO from September 2021 revealed an ejection fraction between 60-65%.  Tumor markers from January revealed a CA 27-29 of 87, a CEA of 6.65 and CA 19-9 was normal.  Recent CA 27-29 from March is significantly elevated at 221, and CEA was 10.12.  Staging PET imaging from March confirmed metastatic breast cancer as evidenced by hypermetabolic left subpectoral/left axillary lymph nodes and widespread hypermetabolic osseous lesions.  There is mild hypermetabolism within an nonenlarged subcarinal lymph node and both hilar regions.  She did receive Xgeva on March 3rd, but did not  tolerate this and so it has been discontinued.  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  She is status post splenectomy, and has had both Prevnar 13 and pneumovax within the last few  years.  She will be due for her next pneumovax in 2024.  She will need to get her meningococcal vaccines and hemophilus influenzae.  Bone density scan from March revealed osteoporosis with a T-score of -3.3 of the forearm, and right femur neck measures -2.0, considered osteopenic.  She did have her meningitis vaccine and experienced severe soreness which is resolving. ECHO from June 30th revealed an EF between 55-60%.  She was placed on alendronate 70 mg weekly.  We stopped the anastrozole and switched her to fulvestrant injections with the 1st one administered September 2nd. New baseline CA 27.29 from September was 299.9, up from 261. She had some worsening mental status changes which seemed to improve when the Vesicare was stopped. There may have been other factors involved such as mild hypercalcemia (up over 11.0), hormonal therapy, and her previous reaction to the meningitis vaccine.  INTERVAL HISTORY:  Amy Kline presents for a TeleHealth visit prior to a 12th cycle of trastuzumab and 4th fulvestrant. She was not able to make it into the clinic today as she has an episode of syncope this morning as she was getting out of the shower, and was out of it for about 5 minutes. Her blood pressure was 175/95 before she went into the shower, and it was down to 129/79 when her daughter arrived 30 minutes later. Dr. Bettina Gavia has been adjusting her blood pressure medications and she worse a heart monitor last week. The results of that are pending. ECHO from October 31st revealed normal EF between 60-65%. She is feeling weak and has a mild headache. She had a more serious fall about 2 weeks ago and was evaluated in the emergency room. Physical therapy continues to work with her at home, and we will request that [place] draw blood on her this week. Her  appetite is somewhat decreased with some nausea, but she is eating well.  She denies fever, chills or other signs of infection.  She denies  vomiting, bowel issues, or  abdominal pain.  She denies sore throat, cough, dyspnea, or chest pain.   Review of Systems  Constitutional:  Positive for appetite change (decreased). Negative for chills, fatigue, fever and unexpected weight change.  HENT:  Negative.    Eyes: Negative.   Respiratory: Negative.  Negative for chest tightness, cough, hemoptysis, shortness of breath and wheezing.   Cardiovascular: Negative.  Negative for chest pain, leg swelling and palpitations.  Gastrointestinal:  Positive for nausea. Negative for abdominal distention, abdominal pain, blood in stool, constipation, diarrhea and vomiting.  Endocrine: Negative.   Genitourinary:  Negative for bladder incontinence, difficulty urinating, dysuria, frequency and hematuria.   Musculoskeletal:  Negative for arthralgias, back pain, flank pain, gait problem and myalgias.  Skin: Negative.   Neurological:  Positive for extremity weakness (due to deconditioning) and headaches. Negative for dizziness, gait problem, light-headedness, numbness, seizures and speech difficulty.  Hematological: Negative.   Psychiatric/Behavioral:  Negative for confusion, depression and sleep disturbance. The patient is not nervous/anxious.     VITALS:  There were no vitals taken for this visit.  Wt Readings from Last 3 Encounters:  05/09/21 169 lb (76.7 kg)  05/02/21 163 lb (73.9 kg)  04/26/21 165 lb (74.8 kg)    There  is no height or weight on file to calculate BMI.  Performance status (ECOG): 1 - Symptomatic but completely ambulatory  PHYSICAL EXAM:  Physical Exam deferred due to TeleHealth visit  LABS:   CBC Latest Ref Rng & Units 04/26/2021 04/23/2021 04/02/2021  WBC 4.0 - 10.5 K/uL 14.3(H) 10.7 8.6  Hemoglobin 12.0 - 15.0 g/dL 15.0 14.2 14.1  Hematocrit 36.0 - 46.0 % 43.1 41 40  Platelets 150 - 400 K/uL 277 357 282   CMP Latest Ref Rng & Units 04/26/2021 04/23/2021 04/02/2021  Glucose 70 - 99 mg/dL 100(H) - -  BUN 8 - 23 mg/dL 22 21 23(A)  Creatinine 0.44 -  1.00 mg/dL 0.90 0.9 0.8  Sodium 135 - 145 mmol/L 136 134(A) 136(A)  Potassium 3.5 - 5.1 mmol/L 4.3 4.1 3.3(A)  Chloride 98 - 111 mmol/L 99 104 100  CO2 22 - 32 mmol/L 27 26(A) 30(A)  Calcium 8.9 - 10.3 mg/dL 10.6(H) 10.1 10.5  Total Protein 6.5 - 8.1 g/dL 7.7 - -  Total Bilirubin 0.3 - 1.2 mg/dL 0.6 - -  Alkaline Phos 38 - 126 U/L 119 146(A) 129(A)  AST 15 - 41 U/L 29 32 39(A)  ALT 0 - 44 U/L '18 18 20     ' Lab Results  Component Value Date   CEA1 8.3 (H) 02/19/2021   /  CEA  Date Value Ref Range Status  02/19/2021 8.3 (H) 0.0 - 4.7 ng/mL Final    Comment:    (NOTE)                             Nonsmokers          <3.9                             Smokers             <5.6 [employer] Electrochemiluminescence Immunoassay (ECLIA) Values obtained with different assay methods or kits cannot be used interchangeably.  Results cannot be interpreted as absolute evidence of the presence or absence of malignant disease. Performed At: [facility] [place], [place] [id number] Rush Farmer MD MI:[id number]     Lab Results  Component Value Date   GNO037 23.7 07/22/2020     STUDIES:   ECHO: 05/12/2021 CONCLUSIONS ----------- 1. Overall left ventricular systolic function is normal with, an EF between 60 - 65 %. 2. The diastolic filling pattern indicates impaired relaxation. 3. The left atrium is normal in size. 4. Trace to mild aortic regurgitation. 5. There is trace mitral regurgitation. 6. Mild tricuspid regurgitation present. 7. The right ventricular systolic pressure, as measured by Doppler, is 30 mmhg.  HISTORY:   Allergies:  Allergies  Allergen Reactions   Neosporin [Neomycin-Polymyxin-Gramicidin] Rash   Sulfa Antibiotics Hives    itching   Bacitracin-Polymyxin B    Bexsero [Meningococcal Grp B (Recomb) Vaccine] Other (See Comments)    Pt reported severe arthralgias and fatigue after 1st dose of bexsero and wishes to not receive again.    Cefdinir Nausea And Vomiting   Denosumab     Pain in jaw/head/mouth numb/weak   Other Other (See Comments)    Polysporin causes a rash and swelling Unknown   Statins     Current Medications: Current Outpatient Medications  Medication Sig Dispense Refill   alendronate (FOSAMAX) 70 MG tablet Take 1 tablet (70 mg total) by mouth once  a week. Take with a full glass of water on an empty stomach. 12 tablet 3   amLODipine (NORVASC) 2.5 MG tablet Take 1 tablet (2.5 mg total) by mouth daily. 90 tablet 1   aspirin 81 MG tablet Take 81 mg by mouth daily.     carvedilol (COREG) 12.5 MG tablet Take 1 tablet (12.5 mg total) by mouth 2 (two) times daily with a meal. 180 tablet 3   cyanocobalamin 1000 MCG tablet Take 1 tablet (1,000 mcg total) by mouth daily. 90 tablet 3   ergocalciferol (VITAMIN D2) 1.25 MG (50000 UT) capsule Vitamin D2 1,250 mcg (50,000 unit) capsule  TAKE 1 CAPSULE BY MOUTH ONCE A WEEK SAME DAY EACH WEEK 12 capsule 3   ibuprofen (ADVIL) 600 MG tablet TAKE 1 TABLET(600 MG) BY MOUTH EVERY 6 HOURS AS NEEDED 90 tablet 1   lactose free nutrition (BOOST) LIQD Take 237 mLs by mouth as needed. Drinks as needed for no appetite     omeprazole (PRILOSEC) 20 MG capsule Take 20 mg by mouth at bedtime.      Polyethylene Glycol 3350 (MIRALAX PO) Take by mouth daily.     pyridostigmine (MESTINON) 60 MG tablet TAKE 0.5 TABLETS(30MG TOTAL) BY MOUTH 3 TIMES DAILY. 45 tablet 5   SAVELLA 50 MG TABS tablet Take 50 mg by mouth 2 (two) times daily.     telmisartan-hydrochlorothiazide (MICARDIS HCT) 40-12.5 MG tablet Take 0.5 tablets by mouth daily. Patient needs appointment for further refills. 1 st attempt  DISCARD UNUSED 1/2 TABLET 90 tablet 0   traMADol (ULTRAM) 50 MG tablet Take 1 tablet (50 mg total) by mouth every 6 (six) hours as needed. 60 tablet 0   No current facility-administered medications for this visit.     ASSESSMENT & PLAN:   Assessment:   1.  Stage IA triple positive left breast  cancer, diagnosed in May 2013.  She was treated with left mastectomy in June.  She was started on tamoxifene 20 mg daily in July 2013, but this was discontinued in July 2017 due to concerns regarding endometrial hyperplasia.  She completed one year of therapy with trastuzumab in September 2014.    2.  Recurrent breast cancer, February 2022, with evidence of left axillary and regional lymph nodes as well as bone lesions.  She was subsequently placed on anastrozole which she started on Febuary 28th.  Trastuzumab every 3 weeks was recommended and started on March 18th 2022. Anastrozole has been discontinued and she has been switched to fulvestrant injections starting September 1st.  3.  Significant elevation of the CA 27-29 measuring 221 in March, 288 in early April, 233 in June and 244 in July.  In early September the CA 27.29 was 261 but this increased to 299.9 at the end of the month.  4.  Lower extremity weakness and episodes of confusion, worsening, being evaluated by neurology and felt to be neuropathy.  She is currently on Mestinon 0.5 mg TID. Home health is working with her for assistance, especially with her medications and PT.    5.  Osteoporosis of the forearm.  She received denosumab on March 3rd, but did not tolerate this therapy due to jaw pain, weakness and fatigue, and so it was discontinued.  We decided to place her on alendronate 70 mg weekly and tolerating this well.  She is no longer on oral calcium.  6.  Status post splenectomy.  She has started vaccines and had a severe reaction with the meningitis vaccines.  We will not pursue further meningitis vaccines.  7.  Reaction to the meningitis vaccines.  Since both were given on same day, we don't know which one.  Will not administer any more.  8. Hypercalcemia, mild and improved. This may have contributed to her severe confusion.  9. Repeated falls with syncope. Dr. Bettina Gavia is evaluating this.  Plan: She will proceed with a 12th cycle  of trastuzumab on Friday and we will administer her 4th dose of fulvestrant at that time.  If she continues to have significant discomfort with fulvestrant, we could consider half dose, but I encouraged her to continue at the standard dose. She knows to continue alendronate 70 mg weekly. Otherwise, we will plan to see her back in 3 weeks with CBC, CMP and CA 27.29 prior to her 13th cycle of trastuzumab. Both she and her daughter verbalize understanding of and agreement to the plans discussed today. They know to call the office should any new questions or concerns arise.   I provided 20 minutes of face-to-face time during this this encounter and > 50% was spent counseling as documented under my assessment and plan.    [name], MD [facility] AT [facility] [address] [place] [address] Dept: [phone] Dept Fax: [phone]    I, Rita Ohara, am acting as scribe for [name], MD  I have reviewed this report as typed by the medical scribe, and it is complete and accurate.

## 2021-05-08 NOTE — Telephone Encounter (Signed)
Left message for patient to return call.

## 2021-05-08 NOTE — Telephone Encounter (Signed)
   Pt's husband returning call, he said if nurse can call him back as soon as possible regarding pt's BP. He said to call him at [phone]

## 2021-05-09 ENCOUNTER — Inpatient Hospital Stay: Payer: Medicare Other

## 2021-05-09 ENCOUNTER — Encounter: Payer: Self-pay | Admitting: Oncology

## 2021-05-09 ENCOUNTER — Other Ambulatory Visit: Payer: Self-pay

## 2021-05-09 VITALS — BP 177/77 | HR 63 | Temp 97.5°F | Resp 18 | Ht 65.0 in | Wt 169.0 lb

## 2021-05-09 DIAGNOSIS — C50212 Malignant neoplasm of upper-inner quadrant of left female breast: Secondary | ICD-10-CM

## 2021-05-09 DIAGNOSIS — Z5111 Encounter for antineoplastic chemotherapy: Secondary | ICD-10-CM | POA: Diagnosis not present

## 2021-05-09 MED ORDER — FULVESTRANT 250 MG/5ML IM SOSY
500.0000 mg | PREFILLED_SYRINGE | Freq: Once | INTRAMUSCULAR | Status: AC
  Administered 2021-05-09: 500 mg via INTRAMUSCULAR
  Filled 2021-05-09: qty 10

## 2021-05-09 MED ORDER — AMLODIPINE BESYLATE 2.5 MG PO TABS: 2.5000 mg | ORAL_TABLET | Freq: Every day | ORAL | 1 refills | Status: DC

## 2021-05-09 NOTE — Telephone Encounter (Signed)
Amlodipine sent

## 2021-05-09 NOTE — Patient Instructions (Signed)
Fulvestrant injection What is this medication? FULVESTRANT (ful VES trant) blocks the effects of estrogen. It is used to treat breast cancer. This medicine may be used for other purposes; ask your health care provider or pharmacist if you have questions. COMMON BRAND NAME(S): FASLODEX What should I tell my care team before I take this medication? They need to know if you have any of these conditions: bleeding disorders liver disease low blood counts, like low white cell, platelet, or red cell counts an unusual or allergic reaction to fulvestrant, other medicines, foods, dyes, or preservatives pregnant or trying to get pregnant breast-feeding How should I use this medication? This medicine is for injection into a muscle. It is usually given by a health care professional in a hospital or clinic setting. Talk to your pediatrician regarding the use of this medicine in children. Special care may be needed. Overdosage: If you think you have taken too much of this medicine contact a poison control center or emergency room at once. NOTE: This medicine is only for you. Do not share this medicine with others. What if I miss a dose? It is important not to miss your dose. Call your doctor or health care professional if you are unable to keep an appointment. What may interact with this medication? medicines that treat or prevent blood clots like warfarin, enoxaparin, dalteparin, apixaban, dabigatran, and rivaroxaban This list may not describe all possible interactions. Give your health care provider a list of all the medicines, herbs, non-prescription drugs, or dietary supplements you use. Also tell them if you smoke, drink alcohol, or use illegal drugs. Some items may interact with your medicine. What should I watch for while using this medication? Your condition will be monitored carefully while you are receiving this medicine. You will need important blood work done while you are taking this  medicine. Do not become pregnant while taking this medicine or for at least 1 year after stopping it. Women of child-bearing potential will need to have a negative pregnancy test before starting this medicine. Women should inform their doctor if they wish to become pregnant or think they might be pregnant. There is a potential for serious side effects to an unborn child. Men should inform their doctors if they wish to father a child. This medicine may lower sperm counts. Talk to your health care professional or pharmacist for more information. Do not breast-feed an infant while taking this medicine or for 1 year after the last dose. What side effects may I notice from receiving this medication? Side effects that you should report to your doctor or health care professional as soon as possible: allergic reactions like skin rash, itching or hives, swelling of the face, lips, or tongue feeling faint or lightheaded, falls pain, tingling, numbness, or weakness in the legs signs and symptoms of infection like fever or chills; cough; flu-like symptoms; sore throat vaginal bleeding Side effects that usually do not require medical attention (report to your doctor or health care professional if they continue or are bothersome): aches, pains constipation diarrhea headache hot flashes nausea, vomiting pain at site where injected stomach pain This list may not describe all possible side effects. Call your doctor for medical advice about side effects. You may report side effects to FDA at 1-800-FDA-1088. Where should I keep my medication? This drug is given in a hospital or clinic and will not be stored at home. NOTE: This sheet is a summary. It may not cover all possible information. If you have   questions about this medicine, talk to your doctor, pharmacist, or health care provider.  2022 Elsevier/Gold Standard (2017-10-07 11:34:41)  

## 2021-05-12 DIAGNOSIS — I361 Nonrheumatic tricuspid (valve) insufficiency: Secondary | ICD-10-CM | POA: Diagnosis not present

## 2021-05-14 ENCOUNTER — Other Ambulatory Visit: Payer: Medicare Other

## 2021-05-14 ENCOUNTER — Encounter: Payer: Self-pay | Admitting: Oncology

## 2021-05-14 ENCOUNTER — Inpatient Hospital Stay: Payer: Medicare Other | Attending: Oncology | Admitting: Oncology

## 2021-05-14 DIAGNOSIS — Z5111 Encounter for antineoplastic chemotherapy: Secondary | ICD-10-CM | POA: Insufficient documentation

## 2021-05-14 DIAGNOSIS — C50212 Malignant neoplasm of upper-inner quadrant of left female breast: Secondary | ICD-10-CM | POA: Diagnosis not present

## 2021-05-14 DIAGNOSIS — C7951 Secondary malignant neoplasm of bone: Secondary | ICD-10-CM

## 2021-05-14 DIAGNOSIS — Z170 Estrogen receptor positive status [ER+]: Secondary | ICD-10-CM

## 2021-05-14 DIAGNOSIS — Z79818 Long term (current) use of other agents affecting estrogen receptors and estrogen levels: Secondary | ICD-10-CM | POA: Insufficient documentation

## 2021-05-15 ENCOUNTER — Telehealth: Payer: Self-pay | Admitting: Neurology

## 2021-05-15 MED FILL — Trastuzumab-Hyaluronidase-oysk Inj 600-10000 MG-Unit/5ML: SUBCUTANEOUS | Qty: 5 | Status: AC

## 2021-05-15 NOTE — Telephone Encounter (Signed)
Dr. Felecia Shelling needed to change the time of this appointment from the afternoon to the morning. I lvm for patient letting them know and to call us back if this time change will not work for them.

## 2021-05-16 ENCOUNTER — Inpatient Hospital Stay: Payer: Medicare Other

## 2021-05-16 ENCOUNTER — Telehealth: Payer: Self-pay | Admitting: Oncology

## 2021-05-16 ENCOUNTER — Other Ambulatory Visit: Payer: Self-pay

## 2021-05-16 VITALS — BP 138/74 | HR 82 | Temp 97.8°F | Resp 18 | Ht 65.0 in | Wt 167.0 lb

## 2021-05-16 DIAGNOSIS — C50212 Malignant neoplasm of upper-inner quadrant of left female breast: Secondary | ICD-10-CM

## 2021-05-16 DIAGNOSIS — Z5111 Encounter for antineoplastic chemotherapy: Secondary | ICD-10-CM | POA: Diagnosis present

## 2021-05-16 DIAGNOSIS — C7951 Secondary malignant neoplasm of bone: Secondary | ICD-10-CM

## 2021-05-16 DIAGNOSIS — Z79818 Long term (current) use of other agents affecting estrogen receptors and estrogen levels: Secondary | ICD-10-CM | POA: Diagnosis present

## 2021-05-16 MED ORDER — TRASTUZUMAB-HYALURONIDASE-OYSK 600-10000 MG-UNT/5ML ~~LOC~~ SOLN
600.0000 mg | Freq: Once | SUBCUTANEOUS | Status: AC
  Administered 2021-05-16: 600 mg via SUBCUTANEOUS
  Filled 2021-05-16: qty 5

## 2021-05-16 MED ORDER — DIPHENHYDRAMINE HCL 25 MG PO CAPS: 25.0000 mg | ORAL_CAPSULE | Freq: Once | ORAL | Status: DC

## 2021-05-16 MED ORDER — ACETAMINOPHEN 325 MG PO TABS: 650.0000 mg | ORAL_TABLET | Freq: Once | ORAL | Status: DC

## 2021-05-16 NOTE — Progress Notes (Signed)
Discharged home, stable. Appt schedule for Nov and Dec given to daughter

## 2021-05-16 NOTE — Telephone Encounter (Signed)
Patient's daughter called to verify patient's Nov/Dec Appt's

## 2021-05-19 ENCOUNTER — Encounter: Payer: Self-pay | Admitting: Oncology

## 2021-05-22 ENCOUNTER — Encounter: Payer: Self-pay | Admitting: Oncology

## 2021-05-26 NOTE — Telephone Encounter (Signed)
Patient's husband calling to follow up. He states he dropped off the BP readings weeks ago and has not heard anything back. He is very concerned. He states the patient also forgot the monitor results that were given to her.

## 2021-05-26 NOTE — Addendum Note (Signed)
Addended by: Resa Miner I on: 05/26/2021 04:31 PM   Modules accepted: Orders

## 2021-05-26 NOTE — Telephone Encounter (Signed)
Spoke to the patients husband just now and let him know Dr. Joya Gaskins recommendations. He verbalizes understanding and thanks me for the call back.    Encouraged patient to call back with any questions or concerns.

## 2021-05-28 NOTE — Progress Notes (Signed)
[facility]  [address] [place],  [place]  [address] [phone]  Clinic Day:  06/04/2021  Referring physician: Raina Mina., MD  This document serves as a record of services personally performed by Hosie Poisson, MD. It was created on their behalf by [facility], a trained medical scribe. The creation of this record is based on the scribe's personal observations and the provider's statements to them.  CHIEF COMPLAINT:  CC: History of recurrent breast cancer   Current Treatment:  Trastuzumab every 3 weeks, Aledronate, and fulvestrant injections monthly  HISTORY OF PRESENT ILLNESS:  Amy Kline is a 87 y.o. female who presents for a transfer of care and continued management and evaluation of recurrent breast cancer.  She was originally diagnosed with a stage IA left breast cancer in May 2013 when biopsy confirmed invasive ductal carcinoma, grade 2.  Estrogen and progesterone receptors were positive at 100% and 96%, and HER2 was negative.  She was treated with left mastectomy in June and repeat HER2 was positive by [facility].  She followed with Dr. Lurline Del, and was started on tamoxifen 20 mg daily in July 2013, but this was discontinued in July 2017 due to concerns regarding endometrial hyperplasia.  She completed one year of therapy with trastuzumab in September 2014.  She did have some cardiotoxicity with this therapy, and so this had to be held this for 5-6 weeks.  However, in the summer of 2021 she started having trouble walking with generalized lower extremity weakness and lower back pain.  She also was having episodes of syncope.  MRI brain from August was negative, but MRI lumbar spine from December revealed STIR hyperintense foci at T12-L1 and S1.  MRI pelvis showed similar areas, and in addition, numerous enhancing marrow replacing lesions in the pelvis.  Bone marrow biopsy from January 2022 showed no evidence of carcinoma and a  normocellular marrow.  CT chest, abdomen and pelvis showed increased left axillary and subpectoral lymphadenopathy but no other evidence of metastatic disease.  She underwent left axillary lymph node biopsy on February 18th which confirmed triple positive carcinoma involving fatty tissue.  She was subsequently placed on anastrozole which she started on Febuary 28th, and denosumab was initiated on March 3rd, but she did not tolerate this, so denosumab was stopped.  Trastuzumab was recommended, and initiated on March 18th.  ECHO from September 2021 revealed an ejection fraction between 60-65%.  Tumor markers from January revealed a CA 27-29 of 87, and CEA of 6.65.  Recent CA 27-29 from March is significantly elevated at 221, and CEA was 10.12.  Staging PET imaging from March confirmed metastatic breast cancer as evidenced by hypermetabolic left subpectoral/left axillary lymph nodes and widespread hypermetabolic osseous lesions.  There is mild hypermetabolism within an nonenlarged subcarinal lymph node and both hilar regions.  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  She is status post splenectomy, and has had both Prevnar 13 and pneumovax within the last few years. Bone density scan from March revealed osteoporosis with a T-score of -3.3 of the forearm, and right femur neck measures -2.0, considered osteopenic.  She did have her meningitis vaccine and experienced severe soreness which is resolving. ECHO from June 30th revealed an EF between 55-60%.  She was placed on alendronate 70 mg weekly.  We stopped the anastrozole and switched her to fulvestrant injections with the 1st one administered September 2nd. New  baseline CA 27.29 from September was 299.9, up from 261. She had some worsening mental status changes which seemed to improve when the Vesicare was stopped. There may have been other factors involved such as mild hypercalcemia (up over 11.0), hormonal therapy, and  her previous reaction to the meningitis vaccine. ECHO from October 31st revealed normal EF between 60-65%.  INTERVAL HISTORY:  Amy Kline is here for routine follow up prior to a 13th cycle of trastuzumab and 5th fulvestrant. She states that she is doing fairly well and her blood pressure is doing better. Blood counts and chemistries are unremarkable except for a BUN of 23. Her  appetite is good, but she has lost 2 pounds since her last visit.  She denies fever, chills or other signs of infection.  She denies nausea, vomiting, bowel issues, or abdominal pain.  She denies sore throat, cough, dyspnea, or chest pain.   Review of Systems  Constitutional: Negative.  Negative for appetite change, chills, fatigue, fever and unexpected weight change.  HENT:  Negative.    Eyes: Negative.   Respiratory: Negative.  Negative for chest tightness, cough, hemoptysis, shortness of breath and wheezing.   Cardiovascular: Negative.  Negative for chest pain, leg swelling and palpitations.  Gastrointestinal: Negative.  Negative for abdominal distention, abdominal pain, blood in stool, constipation, diarrhea, nausea and vomiting.  Endocrine: Negative.   Genitourinary: Negative.  Negative for difficulty urinating, dysuria, frequency and hematuria.   Musculoskeletal:  Positive for gait problem (uses a wheelchair). Negative for arthralgias, back pain, flank pain and myalgias.  Skin: Negative.   Neurological:  Positive for gait problem (uses a wheelchair). Negative for dizziness, extremity weakness, headaches, light-headedness, numbness, seizures and speech difficulty.  Hematological: Negative.   Psychiatric/Behavioral: Negative.  Negative for depression and sleep disturbance. The patient is not nervous/anxious.     VITALS:  Blood pressure 139/77, pulse 65, temperature 98.1 F (36.7 C), temperature source Oral, resp. rate 18, height _0  (1.651 m), weight 166 lb 8 oz (75.5 kg), SpO2 96 %.  Wt Readings from Last 3  Encounters:  06/04/21 166 lb 8 oz (75.5 kg)  05/16/21 167 lb (75.8 kg)  05/09/21 169 lb (76.7 kg)    Body mass index is 27.71 kg/m.  Performance status (ECOG): 1 - Symptomatic but completely ambulatory  PHYSICAL EXAM:  Physical Exam Constitutional:      General: She is not in acute distress.    Appearance: Normal appearance. She is normal weight.  HENT:     Head: Normocephalic and atraumatic.  Eyes:     General: No scleral icterus.    Extraocular Movements: Extraocular movements intact.     Conjunctiva/sclera: Conjunctivae normal.     Pupils: Pupils are equal, round, and reactive to light.  Cardiovascular:     Rate and Rhythm: Normal rate and regular rhythm.     Pulses: Normal pulses.     Heart sounds: Normal heart sounds. No murmur heard.   No friction rub. No gallop.  Pulmonary:     Effort: Pulmonary effort is normal. No respiratory distress.     Breath sounds: Normal breath sounds.  Abdominal:     General: Bowel sounds are normal. There is no distension.     Palpations: Abdomen is soft. There is no hepatomegaly, splenomegaly or mass.     Tenderness: There is no abdominal tenderness.  Musculoskeletal:        General: Normal range of motion.     Cervical back: Normal range of motion and neck  supple.     Right lower leg: No edema.     Left lower leg: No edema.  Lymphadenopathy:     Cervical: No cervical adenopathy.  Skin:    General: Skin is warm and dry.  Neurological:     General: No focal deficit present.     Mental Status: She is alert and oriented to person, place, and time. Mental status is at baseline.  Psychiatric:        Mood and Affect: Mood normal.        Behavior: Behavior normal.        Thought Content: Thought content normal.        Judgment: Judgment normal.    LABS:   CBC Latest Ref Rng & Units 06/04/2021 04/26/2021 04/23/2021  WBC - 9.2 14.3(H) 10.7  Hemoglobin 12.0 - 16.0 14.0 15.0 14.2  Hematocrit 36 - 46 41 43.1 41  Platelets 150 - 399  380 277 357   CMP Latest Ref Rng & Units 06/04/2021 04/26/2021 04/23/2021  Glucose 70 - 99 mg/dL - 100(H) -  BUN 4 - 21 23(A) 22 21  Creatinine 0.5 - 1.1 0.8 0.90 0.9  Sodium 137 - 147 135(A) 136 134(A)  Potassium 3.4 - 5.3 3.8 4.3 4.1  Chloride 99 - 108 103 99 104  CO2 13 - 22 26(A) 27 26(A)  Calcium 8.7 - 10.7 9.9 10.6(H) 10.1  Total Protein 6.5 - 8.1 g/dL - 7.7 -  Total Bilirubin 0.3 - 1.2 mg/dL - 0.6 -  Alkaline Phos 25 - 125 179(A) 119 146(A)  AST 13 - 35 39(A) 29 32  ALT 7 - 35 _0 Lab Results  Component Value Date   CEA1 8.3 (H) 02/19/2021   /  CEA  Date Value Ref Range Status  02/19/2021 8.3 (H) 0.0 - 4.7 ng/mL Final    Comment:    (NOTE)                             Nonsmokers          <3.9                             Smokers             <5.6 Roche [employer] Electrochemiluminescence Immunoassay (ECLIA) Values obtained with different assay methods or kits cannot be used interchangeably.  Results cannot be interpreted as absolute evidence of the presence or absence of malignant disease. Performed At: [facility] [place], [place] [id number] Rush Farmer MD LZ:[id number]     Lab Results  Component Value Date   KWI097 23.7 07/22/2020     STUDIES:    HISTORY:   Allergies:  Allergies  Allergen Reactions   Neosporin [Neomycin-Polymyxin-Gramicidin] Rash   Sulfa Antibiotics Hives    itching   Bacitracin-Polymyxin B    Bexsero [Meningococcal Grp B (Recomb) Vaccine] Other (See Comments)    Pt reported severe arthralgias and fatigue after 1st dose of bexsero and wishes to not receive again.   Cefdinir Nausea And Vomiting   Denosumab     Pain in jaw/head/mouth numb/weak   Other Other (See Comments)    Polysporin causes a rash and swelling Unknown   Statins     Current Medications: Current Outpatient Medications  Medication Sig Dispense Refill   alendronate (FOSAMAX) 70 MG tablet Take 1 tablet (  70 mg total) by  mouth once a week. Take with a full glass of water on an empty stomach. 12 tablet 3   amLODipine (NORVASC) 2.5 MG tablet Take 1 tablet (2.5 mg total) by mouth daily. 90 tablet 1   anastrozole (ARIMIDEX) 1 MG tablet Take 1 mg by mouth daily.     aspirin 81 MG tablet Take 81 mg by mouth daily.     carvedilol (COREG) 12.5 MG tablet Take 1 tablet (12.5 mg total) by mouth 2 (two) times daily with a meal. 180 tablet 3   chlorhexidine (PERIDEX) 0.12 % solution 1 mL by Mouth Rinse route as needed.     cyanocobalamin 1000 MCG tablet Take 1 tablet (1,000 mcg total) by mouth daily. 90 tablet 3   ergocalciferol (VITAMIN D2) 1.25 MG (50000 UT) capsule Vitamin D2 1,250 mcg (50,000 unit) capsule  TAKE 1 CAPSULE BY MOUTH ONCE A WEEK SAME DAY EACH WEEK 12 capsule 3   furosemide (LASIX) 20 MG tablet Take 20 mg by mouth daily.     ibuprofen (ADVIL) 600 MG tablet TAKE 1 TABLET(600 MG) BY MOUTH EVERY 6 HOURS AS NEEDED 90 tablet 1   lactose free nutrition (BOOST) LIQD Take 237 mLs by mouth as needed. Drinks as needed for no appetite     omeprazole (PRILOSEC) 20 MG capsule Take 20 mg by mouth at bedtime.      Polyethylene Glycol 3350 (MIRALAX PO) Take by mouth daily.     pyridostigmine (MESTINON) 60 MG tablet TAKE 0.5 TABLETS(30MG TOTAL) BY MOUTH 3 TIMES DAILY. 45 tablet 5   SAVELLA 50 MG TABS tablet Take 50 mg by mouth 2 (two) times daily.     solifenacin (VESICARE) 5 MG tablet Take 5 mg by mouth daily.     telmisartan-hydrochlorothiazide (MICARDIS HCT) 40-12.5 MG tablet Take 0.5 tablets by mouth daily. Patient needs appointment for further refills. 1 st attempt  DISCARD UNUSED 1/2 TABLET 90 tablet 0   traMADol (ULTRAM) 50 MG tablet Take 1 tablet (50 mg total) by mouth every 6 (six) hours as needed. 60 tablet 0   No current facility-administered medications for this visit.     ASSESSMENT & PLAN:   Assessment:   1.  Stage IA triple positive left breast cancer, diagnosed in May 2013.  She was treated with left  mastectomy in June.  She was started on tamoxifen 20 mg daily in July 2013, but this was discontinued in July 2017 due to concerns regarding endometrial hyperplasia.  She completed one year of therapy with trastuzumab in September 2014.    2.  Recurrent breast cancer, February 2022, with evidence of left axillary and regional lymph nodes as well as bone lesions.  She was subsequently placed on anastrozole which she started on Febuary 28th.  Trastuzumab every 3 weeks was recommended and started on March 18th 2022. Anastrozole has been discontinued and she has been switched to fulvestrant injections starting September 1st.  3.  Significant elevation of the CA 27-29 measuring 221 in March, and 233 in June.  In early September the CA 27.29 was 261 but this increased to 299.9 at the end of the month and was 307 by October.  4.  Lower extremity weakness and episodes of confusion, worsening, being evaluated by neurology and felt to be neuropathy.  She is currently on Mestinon 0.5 mg TID. Home health is working with her for assistance, especially with her medications and PT.    5.  Osteoporosis of the forearm.  She received denosumab on March 3rd, but did not tolerate this therapy due to jaw pain, weakness and fatigue, and so it was discontinued.  We decided to place her on alendronate 70 mg weekly and she is tolerating this well.  She is no longer on oral calcium.  6.  Status post splenectomy.  She has started vaccines and had a severe reaction with the meningitis vaccines.  We will not pursue further meningitis vaccines.  7.  Hypercalcemia, mild and improved. This may have contributed to her severe confusion.  8.  Repeated falls with syncope. Dr. Bettina Gavia is evaluating this.  Plan: She will proceed with a 13th cycle of trastuzumab next week and we will administer her 5th dose of fulvestrant at that time. She knows to continue alendronate 70 mg weekly. Otherwise, we will plan to see her back in 3 weeks with  CBC, CMP and CA 27.29 prior to her 14th cycle of trastuzumab. Both she and her daughter verbalize understanding of and agreement to the plans discussed today. They know to call the office should any new questions or concerns arise.   I provided 20 minutes of face-to-face time during this this encounter and > 50% was spent counseling as documented under my assessment and plan.    [name], MD [facility] [address] [place] [address] Dept: [phone] Dept Fax: [phone]    I, Rita Ohara, am acting as scribe for [name], MD  I have reviewed this report as typed by the medical scribe, and it is complete and accurate.

## 2021-06-02 ENCOUNTER — Other Ambulatory Visit: Payer: Self-pay | Admitting: Oncology

## 2021-06-02 NOTE — Addendum Note (Signed)
Addended by: Juanetta Beets on: 06/02/2021 03:35 PM   Modules accepted: Orders

## 2021-06-03 ENCOUNTER — Telehealth: Payer: Self-pay | Admitting: Cardiology

## 2021-06-03 MED ORDER — AMLODIPINE BESYLATE 2.5 MG PO TABS: 2.5000 mg | ORAL_TABLET | Freq: Every day | ORAL | 1 refills | Status: DC

## 2021-06-03 NOTE — Telephone Encounter (Signed)
*  STAT* If patient is at the pharmacy, call can be transferred to refill team.   1. Which medications need to be refilled? (please list name of each medication and dose if known) amLODipine (NORVASC) 2.5 MG tablet  2. Which pharmacy/location (including street and city if local pharmacy) is medication to be sent to? Patient wants it to go to : [facility] ([place], [place]  3. Do they need a 30 day or 90 day supply? 90 day   Patient is out of medication

## 2021-06-03 NOTE — Telephone Encounter (Signed)
Refill sent in per request.  

## 2021-06-04 ENCOUNTER — Inpatient Hospital Stay (INDEPENDENT_AMBULATORY_CARE_PROVIDER_SITE_OTHER): Payer: Medicare Other | Admitting: Oncology

## 2021-06-04 ENCOUNTER — Inpatient Hospital Stay: Payer: Medicare Other

## 2021-06-04 ENCOUNTER — Encounter: Payer: Self-pay | Admitting: Oncology

## 2021-06-04 ENCOUNTER — Other Ambulatory Visit: Payer: Self-pay | Admitting: Hematology and Oncology

## 2021-06-04 VITALS — BP 139/77 | HR 65 | Temp 98.1°F | Resp 18 | Ht 65.0 in | Wt 166.5 lb

## 2021-06-04 DIAGNOSIS — Z170 Estrogen receptor positive status [ER+]: Secondary | ICD-10-CM

## 2021-06-04 DIAGNOSIS — N390 Urinary tract infection, site not specified: Secondary | ICD-10-CM

## 2021-06-04 DIAGNOSIS — C50212 Malignant neoplasm of upper-inner quadrant of left female breast: Secondary | ICD-10-CM | POA: Diagnosis not present

## 2021-06-04 DIAGNOSIS — C7951 Secondary malignant neoplasm of bone: Secondary | ICD-10-CM | POA: Diagnosis not present

## 2021-06-04 DIAGNOSIS — Z5111 Encounter for antineoplastic chemotherapy: Secondary | ICD-10-CM | POA: Diagnosis not present

## 2021-06-04 LAB — HEPATIC FUNCTION PANEL
ALT: 23 (ref 7–35)
AST: 39 — AB (ref 13–35)
Alkaline Phosphatase: 179 — AB (ref 25–125)
Bilirubin, Total: 0.6

## 2021-06-04 LAB — BASIC METABOLIC PANEL
BUN: 23 — AB (ref 4–21)
CO2: 26 — AB (ref 13–22)
Chloride: 103 (ref 99–108)
Creatinine: 0.8 (ref 0.5–1.1)
Glucose: 109
Potassium: 3.8 (ref 3.4–5.3)
Sodium: 135 — AB (ref 137–147)

## 2021-06-04 LAB — CBC AND DIFFERENTIAL
HCT: 41 (ref 36–46)
Hemoglobin: 14 (ref 12.0–16.0)
Neutrophils Absolute: 4.69
Platelets: 380 (ref 150–399)
WBC: 9.2

## 2021-06-04 LAB — CBC: RBC: 4.45 (ref 3.87–5.11)

## 2021-06-04 LAB — COMPREHENSIVE METABOLIC PANEL
Albumin: 3.9 (ref 3.5–5.0)
Calcium: 9.9 (ref 8.7–10.7)

## 2021-06-04 MED FILL — Trastuzumab-Hyaluronidase-oysk Inj 600-10000 MG-Unit/5ML: SUBCUTANEOUS | Qty: 5 | Status: AC

## 2021-06-07 LAB — CANCER ANTIGEN 27.29: CA 27.29: 403.1 U/mL — ABNORMAL HIGH (ref 0.0–38.6)

## 2021-06-08 ENCOUNTER — Telehealth: Payer: Self-pay | Admitting: Cardiology

## 2021-06-08 NOTE — Telephone Encounter (Signed)
Pt's daughter called -pt with hx of syncope and occ decrease in BP so if BP > 762 systolic she takes coreg 26.3 but if <335 systolic she take 4.56 mg . Today she took a whole tablet by mistake and BP 256 systolic.  She will her amlodipine today and her  micardis hct will be held if BP < 389 systolic.    Daughter had no questions.

## 2021-06-09 ENCOUNTER — Inpatient Hospital Stay: Payer: Medicare Other

## 2021-06-09 ENCOUNTER — Other Ambulatory Visit: Payer: Self-pay

## 2021-06-09 VITALS — BP 135/63 | HR 67 | Temp 97.9°F | Resp 18 | Ht 65.0 in | Wt 167.0 lb

## 2021-06-09 DIAGNOSIS — C50212 Malignant neoplasm of upper-inner quadrant of left female breast: Secondary | ICD-10-CM

## 2021-06-09 DIAGNOSIS — Z5111 Encounter for antineoplastic chemotherapy: Secondary | ICD-10-CM | POA: Diagnosis not present

## 2021-06-09 DIAGNOSIS — C7951 Secondary malignant neoplasm of bone: Secondary | ICD-10-CM

## 2021-06-09 MED ORDER — FULVESTRANT 250 MG/5ML IM SOSY
500.0000 mg | PREFILLED_SYRINGE | Freq: Once | INTRAMUSCULAR | Status: AC
  Administered 2021-06-09: 500 mg via INTRAMUSCULAR
  Filled 2021-06-09: qty 10

## 2021-06-09 MED ORDER — ACETAMINOPHEN 325 MG PO TABS: 650.0000 mg | ORAL_TABLET | Freq: Once | ORAL | Status: DC

## 2021-06-09 MED ORDER — TRASTUZUMAB-HYALURONIDASE-OYSK 600-10000 MG-UNT/5ML ~~LOC~~ SOLN
600.0000 mg | Freq: Once | SUBCUTANEOUS | Status: AC
  Administered 2021-06-09: 600 mg via SUBCUTANEOUS
  Filled 2021-06-09: qty 5

## 2021-06-09 MED ORDER — LORATADINE 10 MG PO TABS: 10.0000 mg | ORAL_TABLET | Freq: Once | ORAL | Status: DC

## 2021-06-09 NOTE — Patient Instructions (Signed)
Fulvestrant injection What is this medication? FULVESTRANT (ful VES trant) blocks the effects of estrogen. It is used to treat breast cancer. This medicine may be used for other purposes; ask your health care provider or pharmacist if you have questions. COMMON BRAND NAME(S): FASLODEX What should I tell my care team before I take this medication? They need to know if you have any of these conditions: bleeding disorders liver disease low blood counts, like low white cell, platelet, or red cell counts an unusual or allergic reaction to fulvestrant, other medicines, foods, dyes, or preservatives pregnant or trying to get pregnant breast-feeding How should I use this medication? This medicine is for injection into a muscle. It is usually given by a health care professional in a hospital or clinic setting. Talk to your pediatrician regarding the use of this medicine in children. Special care may be needed. Overdosage: If you think you have taken too much of this medicine contact a poison control center or emergency room at once. NOTE: This medicine is only for you. Do not share this medicine with others. What if I miss a dose? It is important not to miss your dose. Call your doctor or health care professional if you are unable to keep an appointment. What may interact with this medication? medicines that treat or prevent blood clots like warfarin, enoxaparin, dalteparin, apixaban, dabigatran, and rivaroxaban This list may not describe all possible interactions. Give your health care provider a list of all the medicines, herbs, non-prescription drugs, or dietary supplements you use. Also tell them if you smoke, drink alcohol, or use illegal drugs. Some items may interact with your medicine. What should I watch for while using this medication? Your condition will be monitored carefully while you are receiving this medicine. You will need important blood work done while you are taking this  medicine. Do not become pregnant while taking this medicine or for at least 1 year after stopping it. Women of child-bearing potential will need to have a negative pregnancy test before starting this medicine. Women should inform their doctor if they wish to become pregnant or think they might be pregnant. There is a potential for serious side effects to an unborn child. Men should inform their doctors if they wish to father a child. This medicine may lower sperm counts. Talk to your health care professional or pharmacist for more information. Do not breast-feed an infant while taking this medicine or for 1 year after the last dose. What side effects may I notice from receiving this medication? Side effects that you should report to your doctor or health care professional as soon as possible: allergic reactions like skin rash, itching or hives, swelling of the face, lips, or tongue feeling faint or lightheaded, falls pain, tingling, numbness, or weakness in the legs signs and symptoms of infection like fever or chills; cough; flu-like symptoms; sore throat vaginal bleeding Side effects that usually do not require medical attention (report to your doctor or health care professional if they continue or are bothersome): aches, pains constipation diarrhea headache hot flashes nausea, vomiting pain at site where injected stomach pain This list may not describe all possible side effects. Call your doctor for medical advice about side effects. You may report side effects to FDA at 1-800-FDA-1088. Where should I keep my medication? This drug is given in a hospital or clinic and will not be stored at home. NOTE: This sheet is a summary. It may not cover all possible information. If you have   questions about this medicine, talk to your doctor, pharmacist, or health care provider.  2022 Elsevier/Gold Standard (2017-10-12 00:00:00) Trastuzumab; Hyaluronidase injection What is this  medication? TRASTUZUMAB; HYALURONIDASE (tras TOO zoo mab / hye al ur ON i dase) is used to treat breast cancer and stomach cancer. Trastuzumab is a monoclonal antibody. Hyaluronidase is used to improve the effects of trastuzumab. This medicine may be used for other purposes; ask your health care provider or pharmacist if you have questions. COMMON BRAND NAME(S): HERCEPTIN HYLECTA What should I tell my care team before I take this medication? They need to know if you have any of these conditions: heart disease heart failure lung or breathing disease, like asthma an unusual or allergic reaction to trastuzumab, or other medications, foods, dyes, or preservatives pregnant or trying to get pregnant breast-feeding How should I use this medication? This medicine is for injection under the skin. It is given by a health care professional in a hospital or clinic setting. Talk to your pediatrician regarding the use of this medicine in children. This medicine is not approved for use in children. Overdosage: If you think you have taken too much of this medicine contact a poison control center or emergency room at once. NOTE: This medicine is only for you. Do not share this medicine with others. What if I miss a dose? It is important not to miss a dose. Call your doctor or health care professional if you are unable to keep an appointment. What may interact with this medication? This medicine may interact with the following medications: certain types of chemotherapy, such as daunorubicin, doxorubicin, epirubicin, and idarubicin This list may not describe all possible interactions. Give your health care provider a list of all the medicines, herbs, non-prescription drugs, or dietary supplements you use. Also tell them if you smoke, drink alcohol, or use illegal drugs. Some items may interact with your medicine. What should I watch for while using this medication? Visit your doctor for checks on your progress.  Report any side effects. Continue your course of treatment even though you feel ill unless your doctor tells you to stop. Call your doctor or health care professional for advice if you get a fever, chills or sore throat, or other symptoms of a cold or flu. Do not treat yourself. Try to avoid being around people who are sick. You may experience fever, chills and shaking during your first infusion. These effects are usually mild and can be treated with other medicines. Report any side effects during the infusion to your health care professional. Fever and chills usually do not happen with later infusions. Do not become pregnant while taking this medicine or for 7 months after stopping it. Women should inform their doctor if they wish to become pregnant or think they might be pregnant. Women of child-bearing potential will need to have a negative pregnancy test before starting this medicine. There is a potential for serious side effects to an unborn child. Talk to your health care professional or pharmacist for more information. Do not breast-feed an infant while taking this medicine or for 7 months after stopping it. What side effects may I notice from receiving this medication? Side effects that you should report to your doctor or health care professional as soon as possible: allergic reactions like skin rash, itching or hives, swelling of the face, lips, or tongue breathing problems chest pain or palpitations cough fever general ill feeling or flu-like symptoms signs of worsening heart failure like breathing problems;  swelling in your legs and feet Side effects that usually do not require medical attention (report these to your doctor or health care professional if they continue or are bothersome): bone pain changes in taste diarrhea joint pain nausea/vomiting unusually weak or tired weight loss This list may not describe all possible side effects. Call your doctor for medical advice about side  effects. You may report side effects to FDA at 1-800-FDA-1088. Where should I keep my medication? This drug is given in a hospital or clinic and will not be stored at home. NOTE: This sheet is a summary. It may not cover all possible information. If you have questions about this medicine, talk to your doctor, pharmacist, or health care provider.  2022 Elsevier/Gold Standard (2017-11-15 00:00:00)

## 2021-06-09 NOTE — Progress Notes (Signed)
Discharged home, stable- Patient/Daughter have appt calender.

## 2021-06-10 ENCOUNTER — Ambulatory Visit: Payer: Medicare Other | Admitting: Neurology

## 2021-06-11 ENCOUNTER — Encounter: Payer: Self-pay | Admitting: Oncology

## 2021-06-11 ENCOUNTER — Ambulatory Visit: Payer: Medicare Other | Admitting: Neurology

## 2021-06-12 ENCOUNTER — Ambulatory Visit: Payer: Medicare Other | Admitting: Neurology

## 2021-06-13 ENCOUNTER — Encounter: Payer: Self-pay | Admitting: Oncology

## 2021-06-17 DIAGNOSIS — J189 Pneumonia, unspecified organism: Secondary | ICD-10-CM | POA: Insufficient documentation

## 2021-06-17 DIAGNOSIS — C50919 Malignant neoplasm of unspecified site of unspecified female breast: Secondary | ICD-10-CM | POA: Insufficient documentation

## 2021-06-17 DIAGNOSIS — J349 Unspecified disorder of nose and nasal sinuses: Secondary | ICD-10-CM | POA: Insufficient documentation

## 2021-06-17 DIAGNOSIS — R5383 Other fatigue: Secondary | ICD-10-CM | POA: Insufficient documentation

## 2021-06-17 DIAGNOSIS — R238 Other skin changes: Secondary | ICD-10-CM | POA: Insufficient documentation
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
## 2021-06-23 ENCOUNTER — Encounter: Payer: Self-pay | Admitting: Cardiology

## 2021-06-23 ENCOUNTER — Other Ambulatory Visit: Payer: Self-pay

## 2021-06-23 ENCOUNTER — Ambulatory Visit (INDEPENDENT_AMBULATORY_CARE_PROVIDER_SITE_OTHER): Payer: Medicare Other | Admitting: Cardiology

## 2021-06-23 VITALS — BP 138/76 | HR 72 | Ht 66.0 in | Wt 168.0 lb

## 2021-06-23 DIAGNOSIS — C50212 Malignant neoplasm of upper-inner quadrant of left female breast: Secondary | ICD-10-CM

## 2021-06-23 DIAGNOSIS — I119 Hypertensive heart disease without heart failure: Secondary | ICD-10-CM | POA: Diagnosis not present

## 2021-06-23 DIAGNOSIS — R55 Syncope and collapse: Secondary | ICD-10-CM | POA: Diagnosis not present

## 2021-06-23 NOTE — Patient Instructions (Signed)
Medication Instructions:  Your physician recommends that you continue on your current medications as directed. Please refer to the Current Medication list given to you today.  *If you need a refill on your cardiac medications before your next appointment, please call your pharmacy*   Lab Work: None ordered If you have labs (blood work) drawn today and your tests are completely normal, you will receive your results only by: MyChart Message (if you have MyChart) OR A paper copy in the mail If you have any lab test that is abnormal or we need to change your treatment, we will call you to review the results.   Testing/Procedures: None ordered   Follow-Up: At [facility], you and your health needs are our priority.  As part of our continuing mission to provide you with exceptional heart care, we have created designated Provider Care Teams.  These Care Teams include your primary Cardiologist (physician) and Advanced Practice Providers (APPs -  Physician Assistants and Nurse Practitioners) who all work together to provide you with the care you need, when you need it.  We recommend signing up for the patient portal called "MyChart".  Sign up information is provided on this After Visit Summary.  MyChart is used to connect with patients for Virtual Visits (Telemedicine).  Patients are able to view lab/test results, encounter notes, upcoming appointments, etc.  Non-urgent messages can be sent to your provider as well.   To learn more about what you can do with MyChart, go to [email or web address].    Your next appointment:   6 month(s)  The format for your next appointment:   In Person  Provider:   Brian Munley, MD   Other Instructions NA  

## 2021-06-24 ENCOUNTER — Other Ambulatory Visit: Payer: Self-pay | Admitting: Hematology and Oncology

## 2021-06-24 DIAGNOSIS — C50212 Malignant neoplasm of upper-inner quadrant of left female breast: Secondary | ICD-10-CM

## 2021-06-24 DIAGNOSIS — C7951 Secondary malignant neoplasm of bone: Secondary | ICD-10-CM

## 2021-06-24 DIAGNOSIS — Z170 Estrogen receptor positive status [ER+]: Secondary | ICD-10-CM

## 2021-06-25 ENCOUNTER — Inpatient Hospital Stay: Payer: Medicare Other

## 2021-06-25 ENCOUNTER — Inpatient Hospital Stay: Payer: Medicare Other | Attending: Oncology | Admitting: Hematology and Oncology

## 2021-06-25 ENCOUNTER — Telehealth: Payer: Self-pay | Admitting: Hematology and Oncology

## 2021-06-25 ENCOUNTER — Encounter: Payer: Self-pay | Admitting: Hematology and Oncology

## 2021-06-25 DIAGNOSIS — C7951 Secondary malignant neoplasm of bone: Secondary | ICD-10-CM | POA: Insufficient documentation

## 2021-06-25 DIAGNOSIS — Z5112 Encounter for antineoplastic immunotherapy: Secondary | ICD-10-CM | POA: Insufficient documentation

## 2021-06-25 DIAGNOSIS — C50212 Malignant neoplasm of upper-inner quadrant of left female breast: Secondary | ICD-10-CM | POA: Insufficient documentation

## 2021-06-25 DIAGNOSIS — Z170 Estrogen receptor positive status [ER+]: Secondary | ICD-10-CM | POA: Insufficient documentation

## 2021-06-25 DIAGNOSIS — Z7982 Long term (current) use of aspirin: Secondary | ICD-10-CM | POA: Diagnosis not present

## 2021-06-25 DIAGNOSIS — Z79899 Other long term (current) drug therapy: Secondary | ICD-10-CM | POA: Diagnosis not present

## 2021-06-25 DIAGNOSIS — C773 Secondary and unspecified malignant neoplasm of axilla and upper limb lymph nodes: Secondary | ICD-10-CM | POA: Diagnosis not present

## 2021-06-25 DIAGNOSIS — Z5111 Encounter for antineoplastic chemotherapy: Secondary | ICD-10-CM | POA: Diagnosis present

## 2021-06-25 DIAGNOSIS — Z79818 Long term (current) use of other agents affecting estrogen receptors and estrogen levels: Secondary | ICD-10-CM | POA: Diagnosis not present

## 2021-06-25 LAB — BASIC METABOLIC PANEL
BUN: 23 — AB (ref 4–21)
CO2: 30 — AB (ref 13–22)
Chloride: 99 (ref 99–108)
Creatinine: 0.8 (ref 0.5–1.1)
Glucose: 96
Potassium: 3.9 (ref 3.4–5.3)
Sodium: 136 — AB (ref 137–147)

## 2021-06-25 LAB — CBC
MCV: 93 (ref 81–99)
RBC: 4.76 (ref 3.87–5.11)

## 2021-06-25 LAB — HEPATIC FUNCTION PANEL
ALT: 18 (ref 7–35)
AST: 40 — AB (ref 13–35)
Alkaline Phosphatase: 162 — AB (ref 25–125)

## 2021-06-25 LAB — CBC AND DIFFERENTIAL
HCT: 44 (ref 36–46)
Hemoglobin: 14.8 (ref 12.0–16.0)
Neutrophils Absolute: 4.55
Platelets: 395 (ref 150–399)
WBC: 9.1

## 2021-06-25 LAB — COMPREHENSIVE METABOLIC PANEL
Albumin: 4.1 (ref 3.5–5.0)
Calcium: 10.5 (ref 8.7–10.7)

## 2021-06-25 NOTE — Assessment & Plan Note (Signed)
The calcium is mildly elevated again today.  The patient is not taking any calcium supplement.  She continues alendronate weekly.  This may be due to mild dehydration so I encouraged her to increase her oral intake.  We will continue to monitor this.

## 2021-06-25 NOTE — Assessment & Plan Note (Signed)
Diagnosed in February.  She continues trastuzumab/fulvestrant.

## 2021-06-25 NOTE — Telephone Encounter (Signed)
Per 12/14 LOS, patient already scheduled for Jan 2023.  Gave patient Appt Summary

## 2021-06-25 NOTE — Assessment & Plan Note (Addendum)
Recurrent hormone and HER2 receptor positive breast cancer with left axillary and regional lymph nodes, as well as bone lesions on PET scan in March.  Adenopathy was identified on CT chest, abdomen and pelvis in January. She was placed on anastrozole in February. Trastuzumab every 3 weeks was started in March. Anastrozole was subsequently discontinued and she has been switched to fulvestrant injections in September.  The CA 27-29 was 221 in March and 233 in June, then 261 in September and 307 October. CA 27-29 was up to 403 in November.  We discussed whether to proceed with repeat imaging at this time, but decided to wait for the CA 27-29 from today. She will proceed with 14th cycle of trastuzumab this week and fulvestrant next week.  We will plan to see her back in 3 weeks with a CBC and comprehensive metabolic panel prior to a 15th cycle of trastuzumab.
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
## 2021-06-26 LAB — CANCER ANTIGEN 27.29: CA 27.29: 368 U/mL — ABNORMAL HIGH (ref 0.0–38.6)

## 2021-06-26 MED FILL — Trastuzumab-Hyaluronidase-oysk Inj 600-10000 MG-Unit/5ML: SUBCUTANEOUS | Qty: 5 | Status: AC

## 2021-06-27 ENCOUNTER — Inpatient Hospital Stay: Payer: Medicare Other

## 2021-06-27 ENCOUNTER — Other Ambulatory Visit: Payer: Self-pay

## 2021-06-27 ENCOUNTER — Encounter: Payer: Self-pay | Admitting: Oncology

## 2021-06-27 VITALS — BP 149/74 | HR 78 | Temp 98.1°F | Resp 18 | Ht 66.0 in | Wt 166.0 lb

## 2021-06-27 DIAGNOSIS — C50212 Malignant neoplasm of upper-inner quadrant of left female breast: Secondary | ICD-10-CM

## 2021-06-27 DIAGNOSIS — C7951 Secondary malignant neoplasm of bone: Secondary | ICD-10-CM

## 2021-06-27 DIAGNOSIS — Z5112 Encounter for antineoplastic immunotherapy: Secondary | ICD-10-CM | POA: Diagnosis not present

## 2021-06-27 MED ORDER — ACETAMINOPHEN 325 MG PO TABS: 650.0000 mg | ORAL_TABLET | Freq: Once | ORAL | Status: DC

## 2021-06-27 MED ORDER — TRASTUZUMAB-HYALURONIDASE-OYSK 600-10000 MG-UNT/5ML ~~LOC~~ SOLN
600.0000 mg | Freq: Once | SUBCUTANEOUS | Status: AC
  Administered 2021-06-27: 600 mg via SUBCUTANEOUS
  Filled 2021-06-27: qty 5

## 2021-06-27 MED ORDER — SODIUM CHLORIDE 0.9 % IV SOLN: Freq: Once | INTRAVENOUS | Status: DC

## 2021-06-27 MED ORDER — LORATADINE 10 MG PO TABS: 10.0000 mg | ORAL_TABLET | Freq: Once | ORAL | Status: DC

## 2021-06-27 NOTE — Patient Instructions (Signed)
Trastuzumab; Hyaluronidase injection ?What is this medication? ?TRASTUZUMAB; HYALURONIDASE (tras TOO zoo mab / hye al ur ON i dase) is used to treat breast cancer and stomach cancer. Trastuzumab is a monoclonal antibody. Hyaluronidase is used to improve the effects of trastuzumab. ?This medicine may be used for other purposes; ask your health care provider or pharmacist if you have questions. ?COMMON BRAND NAME(S): HERCEPTIN HYLECTA ?What should I tell my care team before I take this medication? ?They need to know if you have any of these conditions: ?heart disease ?heart failure ?lung or breathing disease, like asthma ?an unusual or allergic reaction to trastuzumab, or other medications, foods, dyes, or preservatives ?pregnant or trying to get pregnant ?breast-feeding ?How should I use this medication? ?This medicine is for injection under the skin. It is given by a health care professional in a hospital or clinic setting. ?Talk to your pediatrician regarding the use of this medicine in children. This medicine is not approved for use in children. ?Overdosage: If you think you have taken too much of this medicine contact a poison control center or emergency room at once. ?NOTE: This medicine is only for you. Do not share this medicine with others. ?What if I miss a dose? ?It is important not to miss a dose. Call your doctor or health care professional if you are unable to keep an appointment. ?What may interact with this medication? ?This medicine may interact with the following medications: ?certain types of chemotherapy, such as daunorubicin, doxorubicin, epirubicin, and idarubicin ?This list may not describe all possible interactions. Give your health care provider a list of all the medicines, herbs, non-prescription drugs, or dietary supplements you use. Also tell them if you smoke, drink alcohol, or use illegal drugs. Some items may interact with your medicine. ?What should I watch for while using this  medication? ?Visit your doctor for checks on your progress. Report any side effects. Continue your course of treatment even though you feel ill unless your doctor tells you to stop. ?Call your doctor or health care professional for advice if you get a fever, chills or sore throat, or other symptoms of a cold or flu. Do not treat yourself. Try to avoid being around people who are sick. ?You may experience fever, chills and shaking during your first infusion. These effects are usually mild and can be treated with other medicines. Report any side effects during the infusion to your health care professional. Fever and chills usually do not happen with later infusions. ?Do not become pregnant while taking this medicine or for 7 months after stopping it. Women should inform their doctor if they wish to become pregnant or think they might be pregnant. Women of child-bearing potential will need to have a negative pregnancy test before starting this medicine. There is a potential for serious side effects to an unborn child. Talk to your health care professional or pharmacist for more information. Do not breast-feed an infant while taking this medicine or for 7 months after stopping it. ?What side effects may I notice from receiving this medication? ?Side effects that you should report to your doctor or health care professional as soon as possible: ?allergic reactions like skin rash, itching or hives, swelling of the face, lips, or tongue ?breathing problems ?chest pain or palpitations ?cough ?fever ?general ill feeling or flu-like symptoms ?signs of worsening heart failure like breathing problems; swelling in your legs and feet ?Side effects that usually do not require medical attention (report these to your doctor   or health care professional if they continue or are bothersome): ?bone pain ?changes in taste ?diarrhea ?joint pain ?nausea/vomiting ?unusually weak or tired ?weight loss ?This list may not describe all possible  side effects. Call your doctor for medical advice about side effects. You may report side effects to FDA at 1-800-FDA-1088. ?Where should I keep my medication? ?This drug is given in a hospital or clinic and will not be stored at home. ?NOTE: This sheet is a summary. It may not cover all possible information. If you have questions about this medicine, talk to your doctor, pharmacist, or health care provider. ?? 2022 Elsevier/Gold Standard (2017-11-15 00:00:00) ? ?

## 2021-07-01 ENCOUNTER — Other Ambulatory Visit: Payer: Self-pay | Admitting: Pharmacist

## 2021-07-01 ENCOUNTER — Encounter: Payer: Self-pay | Admitting: Oncology

## 2021-07-03 ENCOUNTER — Ambulatory Visit: Payer: Medicare Other

## 2021-07-06 ENCOUNTER — Telehealth: Payer: Medicare Other | Admitting: Emergency Medicine

## 2021-07-06 DIAGNOSIS — R300 Dysuria: Secondary | ICD-10-CM

## 2021-07-06 MED ORDER — CIPROFLOXACIN HCL 500 MG PO TABS: 500.0000 mg | ORAL_TABLET | Freq: Every day | ORAL | 0 refills | Status: AC

## 2021-07-06 NOTE — Progress Notes (Signed)
E-Visit for Urinary Problems  We are sorry that you are not feeling well.  Here is how we plan to help!  Based on what you shared with me it looks like you most likely have a simple urinary tract infection.  A UTI (Urinary Tract Infection) is a bacterial infection of the bladder.  Most cases of urinary tract infections are simple to treat but a key part of your care is to encourage you to drink plenty of fluids and watch your symptoms carefully.  I have prescribed ciprofloxacin.  Your symptoms should gradually improve. Call us if the burning in your urine worsens, you develop worsening fever, back pain or pelvic pain or if your symptoms do not resolve after completing the antibiotic.  Urinary tract infections can be prevented by drinking plenty of water to keep your body hydrated.  Also be sure when you wipe, wipe from front to back and don't hold it in!  If possible, empty your bladder every 4 hours.  HOME CARE Drink plenty of fluids Compete the full course of the antibiotics even if the symptoms resolve Remember, when you need to gogo. Holding in your urine can increase the likelihood of getting a UTI! GET HELP RIGHT AWAY IF: You cannot urinate You get a high fever Worsening back pain occurs You see blood in your urine You feel sick to your stomach or throw up You feel like you are going to pass out  MAKE SURE YOU  Understand these instructions. Will watch your condition. Will get help right away if you are not doing well or get worse.   Thank you for choosing an e-visit.  Your e-visit answers were reviewed by a board certified advanced clinical practitioner to complete your personal care plan. Depending upon the condition, your plan could have included both over the counter or prescription medications.  Please review your pharmacy choice. Make sure the pharmacy is open so you can pick up prescription now. If there is a problem, you may contact your provider through [employer] and have the prescription routed to another pharmacy.  Your safety is important to [place]. If you have drug allergies check your prescription carefully.   For the next 24 hours you can use MyChart to ask questions about today's visit, request a non-urgent call back, or ask for a work or school excuse. You will get an email in the next two days asking about your experience. I hope that your e-visit has been valuable and will speed your recovery.

## 2021-07-06 NOTE — Progress Notes (Signed)
I have spent 5 minutes in review of e-visit questionnaire, review and updating patient chart, medical decision making and response to patient.   Aliha Diedrich, PA-C    

## 2021-07-08 ENCOUNTER — Other Ambulatory Visit: Payer: Self-pay

## 2021-07-08 ENCOUNTER — Inpatient Hospital Stay: Payer: Medicare Other

## 2021-07-08 VITALS — BP 151/71 | HR 81 | Temp 97.9°F | Resp 18 | Ht 66.0 in | Wt 165.0 lb

## 2021-07-08 DIAGNOSIS — Z5112 Encounter for antineoplastic immunotherapy: Secondary | ICD-10-CM | POA: Diagnosis not present

## 2021-07-08 DIAGNOSIS — C50212 Malignant neoplasm of upper-inner quadrant of left female breast: Secondary | ICD-10-CM

## 2021-07-08 MED ORDER — FULVESTRANT 250 MG/5ML IM SOSY
500.0000 mg | PREFILLED_SYRINGE | Freq: Once | INTRAMUSCULAR | Status: AC
  Administered 2021-07-08: 500 mg via INTRAMUSCULAR
  Filled 2021-07-08: qty 10

## 2021-07-08 NOTE — Patient Instructions (Signed)
Fulvestrant injection °What is this medication? °FULVESTRANT (ful VES trant) blocks the effects of estrogen. It is used to treat breast cancer. °This medicine may be used for other purposes; ask your health care provider or pharmacist if you have questions. °COMMON BRAND NAME(S): FASLODEX °What should I tell my care team before I take this medication? °They need to know if you have any of these conditions: °bleeding disorders °liver disease °low blood counts, like low white cell, platelet, or red cell counts °an unusual or allergic reaction to fulvestrant, other medicines, foods, dyes, or preservatives °pregnant or trying to get pregnant °breast-feeding °How should I use this medication? °This medicine is for injection into a muscle. It is usually given by a health care professional in a hospital or clinic setting. °Talk to your pediatrician regarding the use of this medicine in children. Special care may be needed. °Overdosage: If you think you have taken too much of this medicine contact a poison control center or emergency room at once. °NOTE: This medicine is only for you. Do not share this medicine with others. °What if I miss a dose? °It is important not to miss your dose. Call your doctor or health care professional if you are unable to keep an appointment. °What may interact with this medication? °medicines that treat or prevent blood clots like warfarin, enoxaparin, dalteparin, apixaban, dabigatran, and rivaroxaban °This list may not describe all possible interactions. Give your health care provider a list of all the medicines, herbs, non-prescription drugs, or dietary supplements you use. Also tell them if you smoke, drink alcohol, or use illegal drugs. Some items may interact with your medicine. °What should I watch for while using this medication? °Your condition will be monitored carefully while you are receiving this medicine. You will need important blood work done while you are taking this  medicine. °Do not become pregnant while taking this medicine or for at least 1 year after stopping it. Women of child-bearing potential will need to have a negative pregnancy test before starting this medicine. Women should inform their doctor if they wish to become pregnant or think they might be pregnant. There is a potential for serious side effects to an unborn child. Men should inform their doctors if they wish to father a child. This medicine may lower sperm counts. Talk to your health care professional or pharmacist for more information. Do not breast-feed an infant while taking this medicine or for 1 year after the last dose. °What side effects may I notice from receiving this medication? °Side effects that you should report to your doctor or health care professional as soon as possible: °allergic reactions like skin rash, itching or hives, swelling of the face, lips, or tongue °feeling faint or lightheaded, falls °pain, tingling, numbness, or weakness in the legs °signs and symptoms of infection like fever or chills; cough; flu-like symptoms; sore throat °vaginal bleeding °Side effects that usually do not require medical attention (report to your doctor or health care professional if they continue or are bothersome): °aches, pains °constipation °diarrhea °headache °hot flashes °nausea, vomiting °pain at site where injected °stomach pain °This list may not describe all possible side effects. Call your doctor for medical advice about side effects. You may report side effects to FDA at 1-800-FDA-1088. °Where should I keep my medication? °This drug is given in a hospital or clinic and will not be stored at home. °NOTE: This sheet is a summary. It may not cover all possible information. If you have   questions about this medicine, talk to your doctor, pharmacist, or health care provider. °© 2022 Elsevier/Gold Standard (2017-10-12 00:00:00) ° °

## 2021-07-09 ENCOUNTER — Telehealth: Payer: Self-pay

## 2021-07-09 NOTE — Telephone Encounter (Addendum)
I notified pt's daughter,[name], of [name]'s response below. I told [name] that if her mom started complaining of dysuria, hematuria, or fever, to please call us. [name] voiced understanding.  ----- Message from [name], PA-C sent at 07/09/2021  2:48 PM EST ----- Regarding: RE: B12, E-visit on 24th or 25th prescribed Cipro for UTI I'm sorry, I don't know why they are asking about B12. When we checked B12 it was high, so she doesn't need this. Her vitamin D level was low though, so she should be taking this. We did not rx the Cipro. In terms of length of therapy, 3 days of therapy is standard for uncomplicated UTI. If she has persistent symptoms, she should have a urine culture done. Thanks! ----- Message ----- From: [name], RN Sent: 07/09/2021   2:41 PM EST To: [name], PA-C Subject: B12, E-visit on 24th or 25th prescribed Cipr#  Pt's daughter,[name], called to ask if pt should still be taking B12 daily? She also wondered if we thought 3 Cipro tabs were enough for UTI. Her mom states she feels a little better , but not 100%. No hematuria. Daughter is unsure if she has dysuria. Please advise.  [phone]

## 2021-07-10 NOTE — Progress Notes (Signed)
[facility]  [address] [place],  [place]  [address] [phone]  Clinic Day:  07/16/2021  Referring physician: Raina Mina., MD  This document serves as a record of services personally performed by Hosie Poisson, MD. It was created on their behalf by [facility] E, a trained medical scribe. The creation of this record is based on the scribe's personal observations and the provider's statements to them.  ASSESSMENT & PLAN:   1.  Stage IA triple positive left breast cancer, diagnosed in May 2013.  She was treated with left mastectomy in June.  She was started on tamoxifen 20 mg daily in July 2013, but this was discontinued in July 2017 due to concerns regarding endometrial hyperplasia.  She completed one year of therapy with trastuzumab in September 2014.     2.  Recurrent breast cancer, February 2022, with evidence of left axillary and regional lymph nodes as well as bone lesions.  She was subsequently placed on anastrozole which she started on Febuary 28th.  Trastuzumab every 3 weeks was recommended and started on March 18th 2022. Anastrozole has been discontinued and she has been switched to fulvestrant injections starting September 1st.   3.  Significant elevation of the CA 27-29 which fluctuates, but was over 300 by October and 403 by November. It is finally coming down now.   4.  Lower extremity weakness and episodes of confusion, worsening, being evaluated by neurology and felt to be neuropathy.  She is currently on Mestinon 0.5 mg TID. Home health is working with her for assistance, especially with her medications and PT.     5.  Osteoporosis of the forearm.  She received denosumab on March 3rd, but did not tolerate this therapy due to jaw pain, weakness and fatigue, and so it was discontinued.  We decided to place her on alendronate 70 mg weekly and she is tolerating this well.  She is no longer on oral calcium.   6.  Status post  splenectomy.  She has started vaccines and had a severe reaction with the meningitis vaccines.  We will not pursue further meningitis vaccines.  She will proceed with a 15th cycle of trastuzumab this week. She will be due for her 6th dose of fulvestrant on January 24th, but we will administer this on January 27th to coincide with her trastuzumab. She knows to continue alendronate 70 mg weekly. Otherwise, we will plan to see her back in 3 weeks with CBC, CMP and CA 27.29 prior to her 16th cycle of trastuzumab. Both she and her daughter verbalize understanding of and agreement to the plans discussed today. They know to call the office should any new questions or concerns arise.   I provided 15 minutes of face-to-face time during this this encounter and > 50% was spent counseling as documented under my assessment and plan.    [name], MD [place] [address] [place] [address] Dept: [phone] Dept Fax: [phone]    CHIEF COMPLAINT:  CC: History of recurrent breast cancer  Current Treatment:  Trastuzumab every 3 weeks, alendronate and fulvestrant injections monthly   HISTORY OF PRESENT ILLNESS:  Amy Kline is a 87 y.o. female who presented as a transfer of care due to recurrent breast cancer. She was originally diagnosed with a stage IA left breast cancer in May 2013 when biopsy confirmed invasive ductal carcinoma, grade 2.  Estrogen and progesterone receptors  were positive at 100% and 96%, and HER2 was negative.  She was treated with left mastectomy in June and repeat HER2 was positive by [facility].  She followed with Dr. Lurline Del, and was started on tamoxifen 20 mg daily in July 2013, but this was discontinued in July 2017 due to concerns regarding endometrial hyperplasia.  She completed one year of therapy with trastuzumab in September 2014.  She did have some cardiotoxicity with this therapy,  and so this had to be held this for 5-6 weeks.   However, in the summer of 2021 she started having trouble walking with generalized lower extremity weakness and lower back pain.  She also was having episodes of syncope.  MRI brain from August was negative, but MRI lumbar spine from December revealed STIR hyperintense foci at T12-L1 and S1.  MRI pelvis showed similar areas, and in addition, numerous enhancing marrow replacing lesions in the pelvis.  Bone marrow biopsy from January 2022 showed no evidence of carcinoma and a normocellular marrow.  CT chest, abdomen and pelvis showed increased left axillary and subpectoral lymphadenopathy but no other evidence of metastatic disease.  She underwent left axillary lymph node biopsy on February 18th which confirmed triple positive carcinoma involving fatty tissue.  She was subsequently placed on anastrozole which she started on Febuary 28th, and denosumab was initiated on March 3rd, but she did not tolerate this, so denosumab was stopped.  Trastuzumab was recommended, and initiated on March 18th.  ECHO from September 2021 revealed an ejection fraction between 60-65%.  Tumor markers from January revealed a CA 27-29 of 87, and CEA of 6.65.  Recent CA 27-29 from March is significantly elevated at 221, and CEA was 10.12.  Staging PET imaging from March confirmed metastatic breast cancer as evidenced by hypermetabolic left subpectoral/left axillary lymph nodes and widespread hypermetabolic osseous lesions.  There is mild hypermetabolism within an nonenlarged subcarinal lymph node and both hilar regions.  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  She is status post splenectomy, and has had both Prevnar 13 and pneumovax within the last few years. Bone density scan from March revealed osteoporosis with a T-score of -3.3 of the forearm, and right femur neck measures -2.0, considered osteopenic.  She did have her meningitis vaccine and  experienced severe soreness which is resolving. ECHO from June 30th revealed an EF between 55-60%.  She was placed on alendronate 70 mg weekly.  We stopped the anastrozole and switched her to fulvestrant injections with the 1st one administered September 2nd. New baseline CA 27.29 from September was 299.9, up from 261. She had some worsening mental status changes which seemed to improve when the Vesicare was stopped. There may have been other factors involved such as mild hypercalcemia (up over 11.0), hormonal therapy, and her previous reaction to the meningitis vaccine. ECHO from October 31st revealed normal EF between 60-65%.  INTERVAL HISTORY:  I have reviewed her chart and materials related to her cancer extensively and collaborated history with the patient. Summary of oncologic history is as follows: Oncology History  Breast cancer of upper-inner quadrant of left female breast ([place])  12/02/2011 Cancer Staging   Staging form: Breast, AJCC 7th Edition - Pathologic: Stage IV (TX, NX, M1) - Signed by Chauncey Cruel, MD on 09/02/2020 Specimen type: Core Needle Biopsy Histopathologic type: Infiltrating duct carcinoma, NOS Laterality: Left    05/30/2013 Initial Diagnosis   Breast cancer of upper-inner quadrant of  left female breast ([place])   09/27/2020 -  Chemotherapy   Patient is on Treatment Plan : BREAST Trastuzumab SubQ q21d     Metastasis to bone ([place])  07/03/2020 Initial Diagnosis   Metastasis to bone ([place])   09/27/2020 -  Chemotherapy   Patient is on Treatment Plan : BREAST Trastuzumab SubQ q21d      Nayellie is here for routine follow up prior to her 15th cycle of trastuzumab. She did have a urinary tract infection on Christmas and was treated with 3 days of Cipro 500 mg. She denies any urinary symptoms now. She complains of back pain that she rates as a 2-3/10. Her calcium is now normal, but her BUN is still elevated at 28 and the rest of her CBC and CMP are unremarkable. Her last CA  27-29 came down from 403 to 368, down 35 points. Her  appetite is good, and she has gained nearly 4 pounds since her last visit.  She denies fever, chills or other signs of infection.  She denies nausea, vomiting, bowel issues, or abdominal pain.  She denies sore throat, cough, dyspnea, or chest pain.  HISTORY:   Allergies:  Allergies  Allergen Reactions   Neosporin [Neomycin-Polymyxin-Gramicidin] Rash   Sulfa Antibiotics Hives    itching   Bacitracin-Polymyxin B    Bexsero [Meningococcal Grp B (Recomb) Vaccine] Other (See Comments)    Pt reported severe arthralgias and fatigue after 1st dose of bexsero and wishes to not receive again.   Cefdinir Nausea And Vomiting   Denosumab     Pain in jaw/head/mouth numb/weak   Other Other (See Comments)    Polysporin causes a rash and swelling Unknown   Statins     Current Medications: Current Outpatient Medications  Medication Sig Dispense Refill   alendronate (FOSAMAX) 70 MG tablet Take 1 tablet (70 mg total) by mouth once a week. Take with a full glass of water on an empty stomach. 12 tablet 3   amLODipine (NORVASC) 2.5 MG tablet Take 1 tablet (2.5 mg total) by mouth daily. 90 tablet 1   aspirin 81 MG tablet Take 81 mg by mouth daily.     carvedilol (COREG) 12.5 MG tablet Take 1 tablet (12.5 mg total) by mouth 2 (two) times daily with a meal. 180 tablet 3   ergocalciferol (VITAMIN D2) 1.25 MG (50000 UT) capsule Take 50,000 Units by mouth once a week.     furosemide (LASIX) 20 MG tablet Take 20 mg by mouth daily.     ibuprofen (ADVIL) 600 MG tablet Take 600 mg by mouth as needed for headache.     lactose free nutrition (BOOST) LIQD Take 237 mLs by mouth as needed (other). for no appetite     omeprazole (PRILOSEC) 20 MG capsule Take 20 mg by mouth at bedtime.      pyridostigmine (MESTINON) 60 MG tablet TAKE 0.5 TABLETS($RemoveBeforeDE'30MG'ESFZmQlgLdphrTf$  TOTAL) BY MOUTH 3 TIMES DAILY. 45 tablet 5   SAVELLA 50 MG TABS tablet Take 50 mg by mouth 2 (two) times daily.      telmisartan-hydrochlorothiazide (MICARDIS HCT) 40-12.5 MG tablet Take 0.5 tablets by mouth daily. Discard unused half of tablet. 45 tablet 2   traMADol (ULTRAM) 50 MG tablet Take 1 tablet (50 mg total) by mouth every 6 (six) hours as needed. 60 tablet 0   No current facility-administered medications for this visit.    REVIEW OF SYSTEMS:  Review of Systems  Constitutional: Negative.  Negative for appetite change, chills, fatigue, fever  and unexpected weight change.  HENT:  Negative.    Eyes: Negative.   Respiratory: Negative.  Negative for chest tightness, cough, hemoptysis, shortness of breath and wheezing.   Cardiovascular: Negative.  Negative for chest pain, leg swelling and palpitations.  Gastrointestinal: Negative.  Negative for abdominal distention, abdominal pain, blood in stool, constipation, diarrhea, nausea and vomiting.  Endocrine: Negative.   Genitourinary: Negative.  Negative for difficulty urinating, dysuria, frequency and hematuria.   Musculoskeletal:  Positive for back pain (mild). Negative for arthralgias, flank pain, gait problem and myalgias.  Skin: Negative.   Neurological:  Positive for numbness (neuropathy/paresthesias of the lower extremities). Negative for dizziness, extremity weakness, gait problem, headaches, light-headedness, seizures and speech difficulty.  Hematological: Negative.   Psychiatric/Behavioral: Negative.  Negative for depression and sleep disturbance. The patient is not nervous/anxious.      VITALS:  Blood pressure (!) 142/66, pulse 68, temperature 97.6 F (36.4 C), temperature source Oral, resp. rate 18, height _0  (1.676 m), weight 168 lb 14.4 oz (76.6 kg), SpO2 93 %.  Wt Readings from Last 3 Encounters:  07/18/21 169 lb 12 oz (77 kg)  07/16/21 168 lb 14.4 oz (76.6 kg)  07/08/21 165 lb (74.8 kg)    Body mass index is 27.26 kg/m.  Performance status (ECOG): 1 - Symptomatic but completely ambulatory  PHYSICAL EXAM:  Physical  Exam Constitutional:      General: She is not in acute distress.    Appearance: Normal appearance. She is normal weight.  HENT:     Head: Normocephalic and atraumatic.  Eyes:     General: No scleral icterus.    Extraocular Movements: Extraocular movements intact.     Conjunctiva/sclera: Conjunctivae normal.     Pupils: Pupils are equal, round, and reactive to light.  Cardiovascular:     Rate and Rhythm: Normal rate and regular rhythm.     Pulses: Normal pulses.     Heart sounds: Normal heart sounds. No murmur heard.   No friction rub. No gallop.  Pulmonary:     Effort: Pulmonary effort is normal. No respiratory distress.     Breath sounds: Normal breath sounds.  Abdominal:     General: Bowel sounds are normal. There is no distension.     Palpations: Abdomen is soft. There is no hepatomegaly, splenomegaly or mass.     Tenderness: There is no abdominal tenderness.  Musculoskeletal:        General: Normal range of motion.     Cervical back: Normal range of motion and neck supple.     Right lower leg: No edema.     Left lower leg: No edema.  Lymphadenopathy:     Cervical: No cervical adenopathy.  Skin:    General: Skin is warm and dry.  Neurological:     General: No focal deficit present.     Mental Status: She is alert and oriented to person, place, and time. Mental status is at baseline.  Psychiatric:        Mood and Affect: Mood normal.        Behavior: Behavior normal.        Thought Content: Thought content normal.        Judgment: Judgment normal.     LABS:   CBC Latest Ref Rng & Units 07/16/2021 06/25/2021 06/04/2021  WBC - 9.8 9.1 9.2  Hemoglobin 12.0 - 16.0 14.0 14.8 14.0  Hematocrit 36 - 46 40 44 41  Platelets 150 - 399 389 395 380  CMP Latest Ref Rng & Units 07/16/2021 06/25/2021 06/04/2021  Glucose 70 - 99 mg/dL - - -  BUN 4 - 21 28(A) 23(A) 23(A)  Creatinine 0.5 - 1.1 0.7 0.8 0.8  Sodium 137 - 147 136(A) 136(A) 135(A)  Potassium 3.4 - 5.3 3.7 3.9 3.8   Chloride 99 - 108 99 99 103  CO2 13 - 22 29(A) 30(A) 26(A)  Calcium 8.7 - 10.7 9.9 10.5 9.9  Total Protein 6.5 - 8.1 g/dL - - -  Total Bilirubin 0.3 - 1.2 mg/dL - - -  Alkaline Phos 25 - 125 193(A) 162(A) 179(A)  AST 13 - 35 31 40(A) 39(A)  ALT 7 - 35 _0 Lab Results  Component Value Date   CEA1 8.3 (H) 02/19/2021   /  CEA  Date Value Ref Range Status  02/19/2021 8.3 (H) 0.0 - 4.7 ng/mL Final    Comment:    (NOTE)                             Nonsmokers          <3.9                             Smokers             <5.6 Roche [employer] Electrochemiluminescence Immunoassay (ECLIA) Values obtained with different assay methods or kits cannot be used interchangeably.  Results cannot be interpreted as absolute evidence of the presence or absence of malignant disease. Performed At: [facility] [place], [place] [id number] Rush Farmer MD [id number]     Lab Results  Component Value Date   UDT143 23.7 07/22/2020      STUDIES:  No results found.    I, Rita Ohara, am acting as scribe for [name], MD  I have reviewed this report as typed by the medical scribe, and it is complete and accurate.

## 2021-07-14 ENCOUNTER — Other Ambulatory Visit: Payer: Self-pay | Admitting: Cardiology

## 2021-07-16 ENCOUNTER — Encounter: Payer: Self-pay | Admitting: Oncology

## 2021-07-16 ENCOUNTER — Other Ambulatory Visit: Payer: Self-pay | Admitting: Oncology

## 2021-07-16 ENCOUNTER — Other Ambulatory Visit: Payer: Self-pay

## 2021-07-16 ENCOUNTER — Inpatient Hospital Stay: Payer: Medicare Other | Attending: Oncology | Admitting: Oncology

## 2021-07-16 ENCOUNTER — Inpatient Hospital Stay: Payer: Medicare Other

## 2021-07-16 ENCOUNTER — Other Ambulatory Visit: Payer: Self-pay | Admitting: Hematology and Oncology

## 2021-07-16 ENCOUNTER — Telehealth: Payer: Self-pay | Admitting: Oncology

## 2021-07-16 ENCOUNTER — Other Ambulatory Visit: Payer: Self-pay | Admitting: Pharmacist

## 2021-07-16 VITALS — BP 142/66 | HR 68 | Temp 97.6°F | Resp 18 | Ht 66.0 in | Wt 168.9 lb

## 2021-07-16 DIAGNOSIS — N390 Urinary tract infection, site not specified: Secondary | ICD-10-CM | POA: Insufficient documentation

## 2021-07-16 DIAGNOSIS — D72829 Elevated white blood cell count, unspecified: Secondary | ICD-10-CM | POA: Diagnosis not present

## 2021-07-16 DIAGNOSIS — Z170 Estrogen receptor positive status [ER+]: Secondary | ICD-10-CM

## 2021-07-16 DIAGNOSIS — C7951 Secondary malignant neoplasm of bone: Secondary | ICD-10-CM

## 2021-07-16 DIAGNOSIS — Z5112 Encounter for antineoplastic immunotherapy: Secondary | ICD-10-CM | POA: Diagnosis present

## 2021-07-16 DIAGNOSIS — C50212 Malignant neoplasm of upper-inner quadrant of left female breast: Secondary | ICD-10-CM | POA: Diagnosis present

## 2021-07-16 LAB — HEPATIC FUNCTION PANEL
ALT: 21 (ref 7–35)
AST: 31 (ref 13–35)
Alkaline Phosphatase: 193 — AB (ref 25–125)
Bilirubin, Total: 0.4

## 2021-07-16 LAB — BASIC METABOLIC PANEL
BUN: 28 — AB (ref 4–21)
CO2: 29 — AB (ref 13–22)
Chloride: 99 (ref 99–108)
Creatinine: 0.7 (ref 0.5–1.1)
Glucose: 120
Potassium: 3.7 (ref 3.4–5.3)
Sodium: 136 — AB (ref 137–147)

## 2021-07-16 LAB — CBC: RBC: 4.31 (ref 3.87–5.11)

## 2021-07-16 LAB — CBC AND DIFFERENTIAL
HCT: 40 (ref 36–46)
Hemoglobin: 14 (ref 12.0–16.0)
Neutrophils Absolute: 4.9
Platelets: 389 (ref 150–399)
WBC: 9.8

## 2021-07-16 LAB — COMPREHENSIVE METABOLIC PANEL
Albumin: 3.8 (ref 3.5–5.0)
Calcium: 9.9 (ref 8.7–10.7)

## 2021-07-16 NOTE — Telephone Encounter (Signed)
Per 1/4 los next appt scheduled and given to patient °

## 2021-07-17 LAB — CANCER ANTIGEN 27.29: CA 27.29: 344.1 U/mL — ABNORMAL HIGH (ref 0.0–38.6)

## 2021-07-17 MED FILL — Trastuzumab-Hyaluronidase-oysk Inj 600-10000 MG-Unit/5ML: SUBCUTANEOUS | Qty: 5 | Status: AC

## 2021-07-18 ENCOUNTER — Other Ambulatory Visit: Payer: Self-pay

## 2021-07-18 ENCOUNTER — Inpatient Hospital Stay: Payer: Medicare Other

## 2021-07-18 VITALS — BP 141/65 | HR 63 | Temp 97.4°F | Resp 16 | Ht 66.0 in | Wt 169.8 lb

## 2021-07-18 DIAGNOSIS — C50212 Malignant neoplasm of upper-inner quadrant of left female breast: Secondary | ICD-10-CM

## 2021-07-18 DIAGNOSIS — C7951 Secondary malignant neoplasm of bone: Secondary | ICD-10-CM

## 2021-07-18 DIAGNOSIS — Z5112 Encounter for antineoplastic immunotherapy: Secondary | ICD-10-CM | POA: Diagnosis not present

## 2021-07-18 MED ORDER — LORATADINE 10 MG PO TABS: 10.0000 mg | ORAL_TABLET | Freq: Once | ORAL | Status: DC

## 2021-07-18 MED ORDER — ACETAMINOPHEN 325 MG PO TABS: 650.0000 mg | ORAL_TABLET | Freq: Once | ORAL | Status: DC

## 2021-07-18 MED ORDER — TRASTUZUMAB-HYALURONIDASE-OYSK 600-10000 MG-UNT/5ML ~~LOC~~ SOLN
600.0000 mg | Freq: Once | SUBCUTANEOUS | Status: AC
  Administered 2021-07-18: 600 mg via SUBCUTANEOUS
  Filled 2021-07-18: qty 5

## 2021-07-18 NOTE — Patient Instructions (Signed)
[facility]  Discharge Instructions: Thank you for choosing [facility] to provide your oncology and hematology care.  If you have a lab appointment with the Cancer Center, please go directly to the Cancer Center and check in at the registration area.   Wear comfortable clothing and clothing appropriate for easy access to any Portacath or PICC line.   We strive to give you quality time with your provider. You may need to reschedule your appointment if you arrive late (15 or more minutes).  Arriving late affects you and other patients whose appointments are after yours.  Also, if you miss three or more appointments without notifying the office, you may be dismissed from the clinic at the provider's discretion.      For prescription refill requests, have your pharmacy contact our office and allow 72 hours for refills to be completed.    Today you received the following chemotherapy and/or immunotherapy agents Trastuzumab     To help prevent nausea and vomiting after your treatment, we encourage you to take your nausea medication as directed.  BELOW ARE SYMPTOMS THAT SHOULD BE REPORTED IMMEDIATELY: *FEVER GREATER THAN 100.4 F (38 C) OR HIGHER *CHILLS OR SWEATING *NAUSEA AND VOMITING THAT IS NOT CONTROLLED WITH YOUR NAUSEA MEDICATION *UNUSUAL SHORTNESS OF BREATH *UNUSUAL BRUISING OR BLEEDING *URINARY PROBLEMS (pain or burning when urinating, or frequent urination) *BOWEL PROBLEMS (unusual diarrhea, constipation, pain near the anus) TENDERNESS IN MOUTH AND THROAT WITH OR WITHOUT PRESENCE OF ULCERS (sore throat, sores in mouth, or a toothache) UNUSUAL RASH, SWELLING OR PAIN  UNUSUAL VAGINAL DISCHARGE OR ITCHING   Items with * indicate a potential emergency and should be followed up as soon as possible or go to the Emergency Department if any problems should occur.  Please show the CHEMOTHERAPY ALERT CARD or IMMUNOTHERAPY ALERT CARD at check-in to the  Emergency Department and triage nurse.  Should you have questions after your visit or need to cancel or reschedule your appointment, please contact [facility]  Dept: [phone]  and follow the prompts.  Office hours are 8:00 a.m. to 4:30 p.m. Monday - Friday. Please note that voicemails left after 4:00 p.m. may not be returned until the following business day.  We are closed weekends and major holidays. You have access to a nurse at all times for urgent questions. Please call the main number to the clinic Dept: [phone] and follow the prompts.  For any non-urgent questions, you may also contact your provider using MyChart. We now offer e-Visits for anyone 18 and older to request care online for non-urgent symptoms. For details visit [email or web address].   Also download the MyChart app! Go to the app store, search "MyChart", open the app, select [facility], and log in with your MyChart username and password.  Due to Covid, a mask is required upon entering the hospital/clinic. If you do not have a mask, one will be given to you upon arrival. For doctor visits, patients may have 1 support person aged 18 or older with them. For treatment visits, patients cannot have anyone with them due to current Covid guidelines and our immunocompromised population.    

## 2021-07-18 NOTE — Progress Notes (Signed)
1423:Patient and Daughter deferred wait time today.PT STABLE AT TIME OF DISCHARGE

## 2021-07-22 ENCOUNTER — Telehealth: Payer: Self-pay

## 2021-07-22 ENCOUNTER — Encounter: Payer: Self-pay | Admitting: Oncology

## 2021-07-22 NOTE — Telephone Encounter (Signed)
[name] (daughter) made aware.

## 2021-07-22 NOTE — Telephone Encounter (Signed)
-----   Message from [name], MD sent at 07/22/2021  6:44 AM EST ----- Regarding: call Tell her daughter the CA 27.29 down another 24 points to 344.1

## 2021-07-28 ENCOUNTER — Encounter: Payer: Self-pay | Admitting: Oncology

## 2021-07-31 NOTE — Progress Notes (Signed)
[facility]  [address] [place],  [place]  [address] [phone]  Clinic Day:  08/06/2021  Referring physician: Raina Mina., MD  This document serves as a record of services personally performed by Hosie Poisson, MD. It was created on their behalf by [facility], a trained medical scribe. The creation of this record is based on the scribe's personal observations and the provider's statements to them.  ASSESSMENT & PLAN:   1.  Stage IA triple positive left breast cancer, diagnosed in May 2013.  She was treated with left mastectomy in June.  She was started on tamoxifen 20 mg daily in July 2013, but this was discontinued in July 2017 due to concerns regarding endometrial hyperplasia.  She completed one year of therapy with trastuzumab in September 2014.     2.  Recurrent breast cancer, February 2022, with evidence of left axillary and regional lymph nodes as well as bone lesions.  She was subsequently placed on anastrozole which she started on Febuary 28th.  Trastuzumab every 3 weeks was recommended and started on March 18th 2022. Anastrozole has been discontinued and she has been switched to fulvestrant injections starting September 1st.   3.  Significant elevation of the CA 27-29 which fluctuates, but was over 300 by October and 403 by November. It is finally coming down now, from 368 to 344 last month.  4.  Lower extremity weakness and episodes of confusion, being evaluated by neurology and felt to be neuropathy.  She is currently on Mestinon 0.5 mg TID. She declines re-evaluation by physical therapy at this time.   5.  Osteoporosis of the forearm.  She received denosumab on March 3rd, but did not tolerate this therapy due to jaw pain, weakness and fatigue, and so it was discontinued.  We decided to place her on alendronate 70 mg weekly and she is tolerating this well.  She is no longer on oral calcium.   6.  Status post splenectomy.  She has  started vaccines and had a severe reaction with the meningitis vaccines.  We will not pursue further meningitis vaccines.  7.  Lower abdominal pain and leukocytosis. We will try and obtain a UA and culture today, and I will proactively place her on antibiotics with Cipro 500 mg BID for 10 days. She has a history of recurrent UTIs.  8.  Hyponatremia. I advised that she include fluids such as Gatorade or Pedialyte.   She will proceed with a 16th cycle of trastuzumab and 6th dose of fulvestrant this week. She knows to continue alendronate 70 mg weekly. In view of her lower abdominal pain and leukopenia, I will proactively place her on antibiotics with Cipro 500 mg BID for 10 days. We will obtain a UA and culture today. She may start a probiotic. She knows to push fluids and include liquids such as Gatorade. Otherwise, we will plan to see her back in 3 weeks with CBC, CMP and CA 27.29 prior to her 17th cycle of trastuzumab.  I did advise that she wear her Life Alert device at all times. Both she and her daughter verbalize understanding of and agreement to the plans discussed today. They know to call the office should any new questions or concerns arise.   I provided 30 minutes of face-to-face time during this this encounter and > 50% was spent counseling as documented under my assessment and plan.    Amy Kaplan, MD [facility] [place] [address] Dept: [phone] Dept Fax: [phone]    CHIEF COMPLAINT:  CC: History of recurrent breast cancer  Current Treatment:  Trastuzumab every 3 weeks, alendronate and fulvestrant injections monthly   HISTORY OF PRESENT ILLNESS:  Amy Kline is a 88 y.o. female who presented as a transfer of care due to recurrent breast cancer. She was originally diagnosed with a stage IA left breast cancer in May 2013 when biopsy confirmed invasive ductal carcinoma, grade 2.   Estrogen and progesterone receptors were positive at 100% and 96%, and HER2 was negative.  She was treated with left mastectomy in June and repeat HER2 was positive by [facility].  She followed with Dr. Lurline Del, and was started on tamoxifen 20 mg daily in July 2013, but this was discontinued in July 2017 due to concerns regarding endometrial hyperplasia.  She completed one year of therapy with trastuzumab in September 2014.  She did have some cardiotoxicity with this therapy, and so this had to be held this for 5-6 weeks.   However, in the summer of 2021 she started having trouble walking with generalized lower extremity weakness and lower back pain.  She also was having episodes of syncope.  MRI brain from August was negative, but MRI lumbar spine from December revealed STIR hyperintense foci at T12-L1 and S1.  MRI pelvis showed similar areas, and in addition, numerous enhancing marrow replacing lesions in the pelvis.  Bone marrow biopsy from January 2022 showed no evidence of carcinoma and a normocellular marrow.  CT chest, abdomen and pelvis showed increased left axillary and subpectoral lymphadenopathy but no other evidence of metastatic disease.  She underwent left axillary lymph node biopsy on February 18th which confirmed triple positive carcinoma involving fatty tissue.  She was subsequently placed on anastrozole which she started on Febuary 28th, and denosumab was initiated on March 3rd, but she did not tolerate this, so denosumab was stopped.  Trastuzumab was recommended, and initiated on March 18th.  ECHO from September 2021 revealed an ejection fraction between 60-65%.  Tumor markers from January revealed a CA 27-29 of 87, and CEA of 6.65.  Recent CA 27-29 from March is significantly elevated at 221, and CEA was 10.12.  Staging PET imaging from March confirmed metastatic breast cancer as evidenced by hypermetabolic left subpectoral/left axillary lymph nodes and widespread hypermetabolic osseous  lesions.  There is mild hypermetabolism within an nonenlarged subcarinal lymph node and both hilar regions.  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  She is status post splenectomy, and has had both Prevnar 13 and pneumovax within the last few years. Bone density scan from March revealed osteoporosis with a T-score of -3.3 of the forearm, and right femur neck measures -2.0, considered osteopenic.  She did have her meningitis vaccine and experienced severe soreness which is resolving. ECHO from June 30th revealed an EF between 55-60%.  She was placed on alendronate 70 mg weekly.  We stopped the anastrozole and switched her to fulvestrant injections with the 1st one administered September 2nd. New baseline CA 27.29 from September was 299.9, up from 261. She had some worsening mental status changes which seemed to improve when the Vesicare was stopped. There may have been other factors involved such as mild hypercalcemia (up over 11.0), hormonal therapy, and her previous reaction to the meningitis vaccine. ECHO from October 31st revealed normal EF between 60-65%.  INTERVAL  HISTORY:  I have reviewed her chart and materials related to her cancer extensively and collaborated history with the patient. Summary of oncologic history is as follows: Oncology History  Breast cancer of upper-inner quadrant of left female breast ([place])  12/02/2011 Cancer Staging   Staging form: Breast, AJCC 7th Edition - Pathologic: Stage IV (TX, NX, M1) - Signed by Chauncey Cruel, MD on 09/02/2020 Specimen type: Core Needle Biopsy Histopathologic type: Infiltrating duct carcinoma, NOS Laterality: Left    05/30/2013 Initial Diagnosis   Breast cancer of upper-inner quadrant of left female breast ([place])   09/27/2020 -  Chemotherapy   Patient is on Treatment Plan : BREAST Trastuzumab SubQ q21d     Metastasis to bone ([place])  07/03/2020 Initial Diagnosis   Metastasis to bone ([place])    09/27/2020 -  Chemotherapy   Patient is on Treatment Plan : BREAST Trastuzumab SubQ q21d      Orrie is here for routine follow up prior to a 16th cycle of trastuzumab and 6th dose of fulvestrant. She states that she has been struggling with extremity weakness and fatigue. Last night she slid out of her bed onto the floor, but did not obtain any injury. She remains in a wheelchair. She declines re-evaluation by physical therapy at this time. She continues to have chronic back pain. She also has had lower abdominal pain. White count has increased from 9.8 to 11.5, and hemoglobin and platelets are normal. Chemistries are unremarkable except for a sodium of 132, a BUN of 20 and a mildly elevated SGOT of 51. Her  appetite is poor, but she does try and eat regular meals.  She denies fever, chills or other signs of infection.  She denies nausea, vomiting, bowel issues, or abdominal pain.  She denies sore throat, cough, dyspnea, or chest pain.  HISTORY:   Allergies:  Allergies  Allergen Reactions   Neosporin [Neomycin-Polymyxin-Gramicidin] Rash   Sulfa Antibiotics Hives    itching   Bacitracin-Polymyxin B    Bexsero [Meningococcal Grp B (Recomb) Vaccine] Other (See Comments)    Pt reported severe arthralgias and fatigue after 1st dose of bexsero and wishes to not receive again.   Cefdinir Nausea And Vomiting   Denosumab     Pain in jaw/head/mouth numb/weak   Other Other (See Comments)    Polysporin causes a rash and swelling Unknown   Statins     Current Medications: Current Outpatient Medications  Medication Sig Dispense Refill   alendronate (FOSAMAX) 70 MG tablet Take 1 tablet (70 mg total) by mouth once a week. Take with a full glass of water on an empty stomach. 12 tablet 3   amLODipine (NORVASC) 2.5 MG tablet Take 1 tablet (2.5 mg total) by mouth daily. 90 tablet 1   aspirin 81 MG tablet Take 81 mg by mouth daily.     carvedilol (COREG) 12.5 MG tablet Take 1 tablet (12.5 mg total) by  mouth 2 (two) times daily with a meal. 180 tablet 3   ergocalciferol (VITAMIN D2) 1.25 MG (50000 UT) capsule Take 50,000 Units by mouth once a week.     furosemide (LASIX) 20 MG tablet Take 20 mg by mouth daily.     ibuprofen (ADVIL) 600 MG tablet Take 600 mg by mouth as needed for headache.     lactose free nutrition (BOOST) LIQD Take 237 mLs by mouth as needed (other). for no appetite     omeprazole (PRILOSEC) 20 MG capsule Take 20 mg by mouth at bedtime.  pyridostigmine (MESTINON) 60 MG tablet TAKE 0.5 TABLETS(30MG TOTAL) BY MOUTH 3 TIMES DAILY. 45 tablet 5   SAVELLA 50 MG TABS tablet Take 50 mg by mouth 2 (two) times daily.     telmisartan-hydrochlorothiazide (MICARDIS HCT) 40-12.5 MG tablet Take 0.5 tablets by mouth daily. Discard unused half of tablet. 45 tablet 2   traMADol (ULTRAM) 50 MG tablet Take 1 tablet (50 mg total) by mouth every 6 (six) hours as needed. 60 tablet 0   No current facility-administered medications for this visit.    REVIEW OF SYSTEMS:  Review of Systems  Constitutional:  Positive for fatigue. Negative for appetite change, chills, fever and unexpected weight change.  HENT:  Negative.    Eyes: Negative.   Respiratory: Negative.  Negative for chest tightness, cough, hemoptysis, shortness of breath and wheezing.   Cardiovascular: Negative.  Negative for chest pain, leg swelling and palpitations.  Gastrointestinal:  Positive for abdominal pain (lower abdomen). Negative for abdominal distention, blood in stool, constipation, diarrhea, nausea and vomiting.  Endocrine: Negative.   Genitourinary:  Negative for difficulty urinating, dysuria, frequency and hematuria.   Musculoskeletal:  Positive for back pain (of the lower back, chronic) and gait problem (in a wheelchair). Negative for arthralgias, flank pain and myalgias.  Skin: Negative.   Neurological:  Positive for extremity weakness, gait problem (in a wheelchair) and numbness (neuropathy/paresthesias of the  lower extremities). Negative for dizziness, headaches, light-headedness, seizures and speech difficulty.  Hematological: Negative.   Psychiatric/Behavioral: Negative.  Negative for depression and sleep disturbance. The patient is not nervous/anxious.      VITALS:  Blood pressure (!) 120/59, pulse 71, temperature 98 F (36.7 C), temperature source Oral, resp. rate 18, height '5\' 6"'  (1.676 m), SpO2 96 %.  Wt Readings from Last 3 Encounters:  07/18/21 169 lb 12 oz (77 kg)  07/16/21 168 lb 14.4 oz (76.6 kg)  07/08/21 165 lb (74.8 kg)    Body mass index is 27.4 kg/m.  Performance status (ECOG): 1 - Symptomatic but completely ambulatory  PHYSICAL EXAM:  Physical Exam Constitutional:      General: She is not in acute distress.    Appearance: Normal appearance. She is normal weight.  HENT:     Head: Normocephalic and atraumatic.  Eyes:     General: No scleral icterus.    Extraocular Movements: Extraocular movements intact.     Conjunctiva/sclera: Conjunctivae normal.     Pupils: Pupils are equal, round, and reactive to light.  Cardiovascular:     Rate and Rhythm: Normal rate and regular rhythm.     Pulses: Normal pulses.     Heart sounds: Normal heart sounds. No murmur heard.   No friction rub. No gallop.  Pulmonary:     Effort: Pulmonary effort is normal. No respiratory distress.     Breath sounds: Normal breath sounds.  Abdominal:     General: Bowel sounds are normal. There is no distension.     Palpations: Abdomen is soft. There is no hepatomegaly, splenomegaly or mass.     Tenderness: There is no abdominal tenderness.  Musculoskeletal:        General: Normal range of motion.     Cervical back: Normal range of motion and neck supple.     Right lower leg: No edema.     Left lower leg: No edema.  Lymphadenopathy:     Cervical: No cervical adenopathy.  Skin:    General: Skin is warm and dry.  Neurological:  General: No focal deficit present.     Mental Status: She is  alert and oriented to person, place, and time. Mental status is at baseline.  Psychiatric:        Mood and Affect: Mood normal.        Behavior: Behavior normal.        Thought Content: Thought content normal.        Judgment: Judgment normal.     LABS:   CBC Latest Ref Rng & Units 08/06/2021 07/16/2021 06/25/2021  WBC - 11.5 9.8 9.1  Hemoglobin 12.0 - 16.0 14.0 14.0 14.8  Hematocrit 36 - 46 41 40 44  Platelets 150 - 399 363 389 395   CMP Latest Ref Rng & Units 08/06/2021 07/16/2021 06/25/2021  Glucose 70 - 99 mg/dL - - -  BUN 4 - 21 20 28(A) 23(A)  Creatinine 0.5 - 1.1 0.8 0.7 0.8  Sodium 137 - 147 132(A) 136(A) 136(A)  Potassium 3.4 - 5.3 3.9 3.7 3.9  Chloride 99 - 108 97(A) 99 99  CO2 13 - 22 29(A) 29(A) 30(A)  Calcium 8.7 - 10.7 10.2 9.9 10.5  Total Protein 6.5 - 8.1 g/dL - - -  Total Bilirubin 0.3 - 1.2 mg/dL - - -  Alkaline Phos 25 - 125 192(A) 193(A) 162(A)  AST 13 - 35 51(A) 31 40(A)  ALT 7 - 35 '22 21 18     ' Lab Results  Component Value Date   CEA1 8.3 (H) 02/19/2021   /  CEA  Date Value Ref Range Status  02/19/2021 8.3 (H) 0.0 - 4.7 ng/mL Final    Comment:    (NOTE)                             Nonsmokers          <3.9                             Smokers             <5.6 Roche [employer] Electrochemiluminescence Immunoassay (ECLIA) Values obtained with different assay methods or kits cannot be used interchangeably.  Results cannot be interpreted as absolute evidence of the presence or absence of malignant disease. Performed At: [facility] [place], [place] [id number] Rush Farmer MD PE:[id number]     Lab Results  Component Value Date   KUV750 23.7 07/22/2020      STUDIES:  No results found.    I, Rita Ohara, am acting as scribe for Amy Kaplan, MD  I have reviewed this report as typed by the medical scribe, and it is complete and accurate.

## 2021-08-01 ENCOUNTER — Ambulatory Visit: Payer: Medicare Other

## 2021-08-05 ENCOUNTER — Telehealth: Payer: Self-pay

## 2021-08-05 NOTE — Telephone Encounter (Addendum)
08/06/21 - I called Amy Kline back. She prefers to just bring her mom in the office. She spoke with her mom a few moments ago and she thinks they can get here. Pt told her that she fell last night trying to go to the bathroom. Amy Kline just thinks she needs labs and to be seen.   Message from Amy Kaplan, MD sent at 08/06/2021  6:48 AM EST ----- Regarding: RE: Pt dtr [facility] to draw labs and get urine sample 1. I can still do telehealth appt.  If she can't get her in, how will she get treatment anyway? 2. If we cannot get urine, I am not opposed to ordering an ab to see if it helps, since has had freq UTI's 3. PT can only continue if she shows improvement, she has prob improved all that she is going to. We can request she be re-evaluated   ----- Message ----- From: Amy Pickles, PA-C Sent: 08/05/2021   3:22 PM EST To: Amy Kaplan, MD Subject: FW: Pt dtr req Home Health to draw labs and #     ----- Message ----- From: Amy Ponder, RN Sent: 08/05/2021   3:09 PM EST To: Amy Pickles, PA-C Subject: RE: Pt dtr req Home Health to draw labs and #     Amy Hairston,RN @ [phone] - I spoke with Amy Kline. I notified her that Amy Kline can't come out to collect blood and urine specimen per case manager. Amy Kline is frustrated that home health discharged her.  "Mom needs help with walking. She doesn't understand how Amy Kline could feel as though her goals have been met. Mom told me that Amy Kline told her if she needed them after Christmas to just let them know".  I told Amy Kline of Amy Kline's response below. She verbalized understanding, but doesn't think she can get her mom out of the home. I encouraged her to call EMS if her mom developed a fever or if she felt there were changes that needed to be evaluated.     RE: Pt dtr req Home Health to draw labs and get urine sample Received: Today Amy Kline, Amy Flock, RN; Amy Kaplan, MD So I guess she will have to keep the  appointment with [place] or take her to urgent care today  Amy Riede,RN: '@[id number]'  - I spoke with Amy Kline, Case manager for Amy Kline. Pt's chart is closed and obtaining labs and urine sample is not covered as a skilled event to reopen or place new order.  Amy Groseclose,RN: '@1406'  - I spoke with Amy Kline, w/Amy Kline. Pt is no longer being seen by home health. She was discharged from their services.  ----- Message from Amy Kaplan, MD sent at 08/05/2021 12:12 PM EST ----- Regarding: RE: Pt dtr Amy Kline to draw labs and get urine sample Yes, let's ask home health to get CBC, CMP, UA, C&S, will skip CA 27.29 this time since doing good. Then we can do a telehealth visit tomorrow  ----- Message ----- From: Amy Ponder, RN Sent: 08/05/2021  12:00 PM EST To: Amy Kaplan, MD, Amy Pickles, PA-C Subject: Pt dtr req Home Health to draw labs and get #  Amy Kline, pt's dtr called to report, "Mom is extremely weak and has been for the last 2-3 days. She is in bed, not able to take shower. I think she may have another UTI. She has an appt with Dr Amy Kline tomorrow that may need to  be done on the phone. Home Health could draw labs and get urine sample today if we tell them soon enough".

## 2021-08-06 ENCOUNTER — Other Ambulatory Visit: Payer: Self-pay | Admitting: Oncology

## 2021-08-06 ENCOUNTER — Telehealth: Payer: Self-pay | Admitting: Oncology

## 2021-08-06 ENCOUNTER — Other Ambulatory Visit: Payer: Self-pay | Admitting: Pharmacist

## 2021-08-06 ENCOUNTER — Encounter: Payer: Self-pay | Admitting: Oncology

## 2021-08-06 ENCOUNTER — Other Ambulatory Visit: Payer: Self-pay

## 2021-08-06 ENCOUNTER — Inpatient Hospital Stay: Payer: Medicare Other

## 2021-08-06 ENCOUNTER — Other Ambulatory Visit: Payer: Self-pay | Admitting: Hematology and Oncology

## 2021-08-06 ENCOUNTER — Telehealth: Payer: Self-pay

## 2021-08-06 ENCOUNTER — Inpatient Hospital Stay (INDEPENDENT_AMBULATORY_CARE_PROVIDER_SITE_OTHER): Payer: Medicare Other | Admitting: Oncology

## 2021-08-06 VITALS — BP 120/59 | HR 71 | Temp 98.0°F | Resp 18 | Ht 66.0 in

## 2021-08-06 DIAGNOSIS — Z170 Estrogen receptor positive status [ER+]: Secondary | ICD-10-CM

## 2021-08-06 DIAGNOSIS — Z5112 Encounter for antineoplastic immunotherapy: Secondary | ICD-10-CM | POA: Diagnosis not present

## 2021-08-06 DIAGNOSIS — C50212 Malignant neoplasm of upper-inner quadrant of left female breast: Secondary | ICD-10-CM

## 2021-08-06 DIAGNOSIS — N390 Urinary tract infection, site not specified: Secondary | ICD-10-CM

## 2021-08-06 DIAGNOSIS — C7951 Secondary malignant neoplasm of bone: Secondary | ICD-10-CM | POA: Diagnosis not present

## 2021-08-06 DIAGNOSIS — Z79899 Other long term (current) drug therapy: Secondary | ICD-10-CM

## 2021-08-06 LAB — URINALYSIS, COMPLETE (UACMP) WITH MICROSCOPIC
Bilirubin Urine: NEGATIVE
Glucose, UA: NEGATIVE mg/dL
Hgb urine dipstick: NEGATIVE
Ketones, ur: NEGATIVE mg/dL
Nitrite: NEGATIVE
Protein, ur: NEGATIVE mg/dL
Specific Gravity, Urine: 1.017 (ref 1.005–1.030)
pH: 5 (ref 5.0–8.0)

## 2021-08-06 LAB — HEPATIC FUNCTION PANEL
ALT: 22 (ref 7–35)
AST: 51 — AB (ref 13–35)
Alkaline Phosphatase: 192 — AB (ref 25–125)
Bilirubin, Total: 0.8

## 2021-08-06 LAB — BASIC METABOLIC PANEL
BUN: 20 (ref 4–21)
CO2: 29 — AB (ref 13–22)
Chloride: 97 — AB (ref 99–108)
Creatinine: 0.8 (ref 0.5–1.1)
Glucose: 94
Potassium: 3.9 (ref 3.4–5.3)
Sodium: 132 — AB (ref 137–147)

## 2021-08-06 LAB — CBC
MCV: 92 (ref 81–99)
RBC: 4.41 (ref 3.87–5.11)

## 2021-08-06 LAB — CBC AND DIFFERENTIAL
HCT: 41 (ref 36–46)
Hemoglobin: 14 (ref 12.0–16.0)
Neutrophils Absolute: 6.1
Platelets: 363 (ref 150–399)
WBC: 11.5

## 2021-08-06 LAB — COMPREHENSIVE METABOLIC PANEL
Albumin: 3.8 (ref 3.5–5.0)
Calcium: 10.2 (ref 8.7–10.7)

## 2021-08-06 MED ORDER — CIPROFLOXACIN HCL 500 MG PO TABS: 500.0000 mg | ORAL_TABLET | Freq: Two times a day (BID) | ORAL | 0 refills | Status: AC

## 2021-08-06 NOTE — Telephone Encounter (Signed)
Patient has been scheduled for follow-up visit per 06/16/22 los. Pt given an appt calendar with date and time.  

## 2021-08-06 NOTE — Telephone Encounter (Addendum)
Error; just added to call from yesterday.

## 2021-08-07 ENCOUNTER — Telehealth: Payer: Self-pay

## 2021-08-07 LAB — CANCER ANTIGEN 27.29: CA 27.29: 706.2 U/mL — ABNORMAL HIGH (ref 0.0–38.6)

## 2021-08-07 NOTE — Telephone Encounter (Addendum)
° ° ° ° °  RE: test results Received: Today [name], MD  [name], RN; Melodye Ped, NP Mayer Camel, we are both concerned about dramatic increase in marker (CA27.29) but would like to repeat before worrying the pt.  She has not told pt and I am okay with waiting to see next one.  If still high, we will consider scans and then meet to discuss change in treatment plan     I spoke with [name]. She is concerned that her moms CEA went from 344 to 46. She wanted to know if that was alarming to you? It is to her as her numbers were going down the last 2 checks and then just shooting up to the highest it has ever been. She states, "I saw in Mychart that the lab put they diluted the specimen sample and repeated the CEA. Would dilution cause it to be higher"? She really wants to talk with you when you get a chance. She thinks her mom is doing better on the antibiotic. So pt will take it all.      RE: question about test results seen in MyChart Received: 4 days ago  RE: question about test results seen in MyChart Received: 3 days ago [name], MD  [name], RN If she feels better on the ab, I would take full course.  If not, could stop, culture neg but UA was abn        Previous Messages   ----- Message -----  From: [name], RN  Sent: 08/08/2021  11:34 AM EST  To: [name], MD  Subject: RE: question about test results seen in [facility]*   The culture was <10,000  colonies/mL insignificant growth.    [name], MD  [name], RN I had ordered the Cipro, pls check for the urine culture Friday  I have no idea if the UTI would have done that, we will just have to see what the next one shows   [name], pt's daughter, called. She looked at the test results and had a couple of questions. First, she said "her urinalysis looked bad to me, like she may have a UTI. I'm not a doctor though". Second, she noticed the  cancer antigen is higher and asked if the infection (possible UTI) would affect that number"?  [phone] I sent the above message to Dr [name] @ [id number]

## 2021-08-08 ENCOUNTER — Inpatient Hospital Stay: Payer: Medicare Other

## 2021-08-08 ENCOUNTER — Other Ambulatory Visit: Payer: Self-pay

## 2021-08-08 VITALS — BP 125/70 | HR 78 | Temp 97.2°F | Resp 20

## 2021-08-08 DIAGNOSIS — C7951 Secondary malignant neoplasm of bone: Secondary | ICD-10-CM

## 2021-08-08 DIAGNOSIS — C50212 Malignant neoplasm of upper-inner quadrant of left female breast: Secondary | ICD-10-CM

## 2021-08-08 DIAGNOSIS — Z5112 Encounter for antineoplastic immunotherapy: Secondary | ICD-10-CM | POA: Diagnosis not present

## 2021-08-08 LAB — URINE CULTURE: Culture: <10000 — AB

## 2021-08-08 MED ORDER — LORATADINE 10 MG PO TABS: 10.0000 mg | ORAL_TABLET | Freq: Once | ORAL | Status: DC

## 2021-08-08 MED ORDER — ACETAMINOPHEN 325 MG PO TABS: 650.0000 mg | ORAL_TABLET | Freq: Once | ORAL | Status: DC

## 2021-08-08 MED ORDER — TRASTUZUMAB-HYALURONIDASE-OYSK 600-10000 MG-UNT/5ML ~~LOC~~ SOLN
600.0000 mg | Freq: Once | SUBCUTANEOUS | Status: AC
  Administered 2021-08-08: 600 mg via SUBCUTANEOUS
  Filled 2021-08-08: qty 5

## 2021-08-08 MED ORDER — FULVESTRANT 250 MG/5ML IM SOSY
500.0000 mg | PREFILLED_SYRINGE | Freq: Once | INTRAMUSCULAR | Status: AC
  Administered 2021-08-08: 500 mg via INTRAMUSCULAR
  Filled 2021-08-08: qty 10

## 2021-08-08 NOTE — Patient Instructions (Signed)
Fulvestrant injection °What is this medication? °FULVESTRANT (ful VES trant) blocks the effects of estrogen. It is used to treat breast cancer. °This medicine may be used for other purposes; ask your health care provider or pharmacist if you have questions. °COMMON BRAND NAME(S): FASLODEX °What should I tell my care team before I take this medication? °They need to know if you have any of these conditions: °bleeding disorders °liver disease °low blood counts, like low white cell, platelet, or red cell counts °an unusual or allergic reaction to fulvestrant, other medicines, foods, dyes, or preservatives °pregnant or trying to get pregnant °breast-feeding °How should I use this medication? °This medicine is for injection into a muscle. It is usually given by a health care professional in a hospital or clinic setting. °Talk to your pediatrician regarding the use of this medicine in children. Special care may be needed. °Overdosage: If you think you have taken too much of this medicine contact a poison control center or emergency room at once. °NOTE: This medicine is only for you. Do not share this medicine with others. °What if I miss a dose? °It is important not to miss your dose. Call your doctor or health care professional if you are unable to keep an appointment. °What may interact with this medication? °medicines that treat or prevent blood clots like warfarin, enoxaparin, dalteparin, apixaban, dabigatran, and rivaroxaban °This list may not describe all possible interactions. Give your health care provider a list of all the medicines, herbs, non-prescription drugs, or dietary supplements you use. Also tell them if you smoke, drink alcohol, or use illegal drugs. Some items may interact with your medicine. °What should I watch for while using this medication? °Your condition will be monitored carefully while you are receiving this medicine. You will need important blood work done while you are taking this  medicine. °Do not become pregnant while taking this medicine or for at least 1 year after stopping it. Women of child-bearing potential will need to have a negative pregnancy test before starting this medicine. Women should inform their doctor if they wish to become pregnant or think they might be pregnant. There is a potential for serious side effects to an unborn child. Men should inform their doctors if they wish to father a child. This medicine may lower sperm counts. Talk to your health care professional or pharmacist for more information. Do not breast-feed an infant while taking this medicine or for 1 year after the last dose. °What side effects may I notice from receiving this medication? °Side effects that you should report to your doctor or health care professional as soon as possible: °allergic reactions like skin rash, itching or hives, swelling of the face, lips, or tongue °feeling faint or lightheaded, falls °pain, tingling, numbness, or weakness in the legs °signs and symptoms of infection like fever or chills; cough; flu-like symptoms; sore throat °vaginal bleeding °Side effects that usually do not require medical attention (report to your doctor or health care professional if they continue or are bothersome): °aches, pains °constipation °diarrhea °headache °hot flashes °nausea, vomiting °pain at site where injected °stomach pain °This list may not describe all possible side effects. Call your doctor for medical advice about side effects. You may report side effects to FDA at 1-800-FDA-1088. °Where should I keep my medication? °This drug is given in a hospital or clinic and will not be stored at home. °NOTE: This sheet is a summary. It may not cover all possible information. If you have   questions about this medicine, talk to your doctor, pharmacist, or health care provider.  2022 Elsevier/Gold Standard (2017-10-12 00:00:00) Trastuzumab; Hyaluronidase injection What is this  medication? TRASTUZUMAB; HYALURONIDASE (tras TOO zoo mab / hye al ur ON i dase) is used to treat breast cancer and stomach cancer. Trastuzumab is a monoclonal antibody. Hyaluronidase is used to improve the effects of trastuzumab. This medicine may be used for other purposes; ask your health care provider or pharmacist if you have questions. COMMON BRAND NAME(S): HERCEPTIN HYLECTA What should I tell my care team before I take this medication? They need to know if you have any of these conditions: heart disease heart failure lung or breathing disease, like asthma an unusual or allergic reaction to trastuzumab, or other medications, foods, dyes, or preservatives pregnant or trying to get pregnant breast-feeding How should I use this medication? This medicine is for injection under the skin. It is given by a health care professional in a hospital or clinic setting. Talk to your pediatrician regarding the use of this medicine in children. This medicine is not approved for use in children. Overdosage: If you think you have taken too much of this medicine contact a poison control center or emergency room at once. NOTE: This medicine is only for you. Do not share this medicine with others. What if I miss a dose? It is important not to miss a dose. Call your doctor or health care professional if you are unable to keep an appointment. What may interact with this medication? This medicine may interact with the following medications: certain types of chemotherapy, such as daunorubicin, doxorubicin, epirubicin, and idarubicin This list may not describe all possible interactions. Give your health care provider a list of all the medicines, herbs, non-prescription drugs, or dietary supplements you use. Also tell them if you smoke, drink alcohol, or use illegal drugs. Some items may interact with your medicine. What should I watch for while using this medication? Visit your doctor for checks on your progress.  Report any side effects. Continue your course of treatment even though you feel ill unless your doctor tells you to stop. Call your doctor or health care professional for advice if you get a fever, chills or sore throat, or other symptoms of a cold or flu. Do not treat yourself. Try to avoid being around people who are sick. You may experience fever, chills and shaking during your first infusion. These effects are usually mild and can be treated with other medicines. Report any side effects during the infusion to your health care professional. Fever and chills usually do not happen with later infusions. Do not become pregnant while taking this medicine or for 7 months after stopping it. Women should inform their doctor if they wish to become pregnant or think they might be pregnant. Women of child-bearing potential will need to have a negative pregnancy test before starting this medicine. There is a potential for serious side effects to an unborn child. Talk to your health care professional or pharmacist for more information. Do not breast-feed an infant while taking this medicine or for 7 months after stopping it. What side effects may I notice from receiving this medication? Side effects that you should report to your doctor or health care professional as soon as possible: allergic reactions like skin rash, itching or hives, swelling of the face, lips, or tongue breathing problems chest pain or palpitations cough fever general ill feeling or flu-like symptoms signs of worsening heart failure like breathing problems;  swelling in your legs and feet Side effects that usually do not require medical attention (report these to your doctor or health care professional if they continue or are bothersome): bone pain changes in taste diarrhea joint pain nausea/vomiting unusually weak or tired weight loss This list may not describe all possible side effects. Call your doctor for medical advice about side  effects. You may report side effects to FDA at 1-800-FDA-1088. Where should I keep my medication? This drug is given in a hospital or clinic and will not be stored at home. NOTE: This sheet is a summary. It may not cover all possible information. If you have questions about this medicine, talk to your doctor, pharmacist, or health care provider.  2022 Elsevier/Gold Standard (2017-11-15 00:00:00)

## 2021-08-12 ENCOUNTER — Encounter: Payer: Self-pay | Admitting: Oncology

## 2021-08-14 ENCOUNTER — Encounter: Payer: Self-pay | Admitting: Oncology

## 2021-08-15 ENCOUNTER — Encounter: Payer: Self-pay | Admitting: Oncology

## 2021-08-21 ENCOUNTER — Ambulatory Visit (INDEPENDENT_AMBULATORY_CARE_PROVIDER_SITE_OTHER): Payer: Medicare Other | Admitting: Neurology

## 2021-08-21 ENCOUNTER — Other Ambulatory Visit: Payer: Self-pay

## 2021-08-21 ENCOUNTER — Encounter: Payer: Self-pay | Admitting: Oncology

## 2021-08-21 ENCOUNTER — Encounter: Payer: Self-pay | Admitting: Neurology

## 2021-08-21 VITALS — BP 134/80 | HR 76 | Ht 66.0 in | Wt 169.0 lb

## 2021-08-21 DIAGNOSIS — Z8673 Personal history of transient ischemic attack (TIA), and cerebral infarction without residual deficits: Secondary | ICD-10-CM | POA: Diagnosis not present

## 2021-08-21 DIAGNOSIS — C50919 Malignant neoplasm of unspecified site of unspecified female breast: Secondary | ICD-10-CM | POA: Diagnosis not present

## 2021-08-21 DIAGNOSIS — E538 Deficiency of other specified B group vitamins: Secondary | ICD-10-CM

## 2021-08-21 DIAGNOSIS — R269 Unspecified abnormalities of gait and mobility: Secondary | ICD-10-CM

## 2021-08-21 DIAGNOSIS — C7951 Secondary malignant neoplasm of bone: Secondary | ICD-10-CM

## 2021-08-21 DIAGNOSIS — G629 Polyneuropathy, unspecified: Secondary | ICD-10-CM

## 2021-08-21 MED ORDER — LAMOTRIGINE 25 MG PO TABS: 25.0000 mg | ORAL_TABLET | Freq: Two times a day (BID) | ORAL | 5 refills | Status: DC
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
## 2021-08-22 DIAGNOSIS — I361 Nonrheumatic tricuspid (valve) insufficiency: Secondary | ICD-10-CM | POA: Diagnosis not present

## 2021-08-26 LAB — VITAMIN B1: Thiamine: 114.2 nmol/L (ref 66.5–200.0)

## 2021-08-26 LAB — VITAMIN B12: Vitamin B-12: 1395 pg/mL — ABNORMAL HIGH (ref 232–1245)

## 2021-08-26 LAB — COPPER, SERUM: Copper: 152 ug/dL (ref 80–158)

## 2021-08-27 ENCOUNTER — Encounter: Payer: Self-pay | Admitting: Hematology and Oncology

## 2021-08-27 ENCOUNTER — Inpatient Hospital Stay (INDEPENDENT_AMBULATORY_CARE_PROVIDER_SITE_OTHER): Payer: Medicare Other | Admitting: Hematology and Oncology

## 2021-08-27 ENCOUNTER — Other Ambulatory Visit: Payer: Self-pay

## 2021-08-27 ENCOUNTER — Telehealth: Payer: Self-pay | Admitting: Hematology and Oncology

## 2021-08-27 ENCOUNTER — Inpatient Hospital Stay: Payer: Medicare Other | Attending: Oncology

## 2021-08-27 DIAGNOSIS — N390 Urinary tract infection, site not specified: Secondary | ICD-10-CM

## 2021-08-27 DIAGNOSIS — Z79818 Long term (current) use of other agents affecting estrogen receptors and estrogen levels: Secondary | ICD-10-CM | POA: Insufficient documentation

## 2021-08-27 DIAGNOSIS — C7951 Secondary malignant neoplasm of bone: Secondary | ICD-10-CM | POA: Diagnosis not present

## 2021-08-27 DIAGNOSIS — Z79899 Other long term (current) drug therapy: Secondary | ICD-10-CM | POA: Diagnosis not present

## 2021-08-27 DIAGNOSIS — Z170 Estrogen receptor positive status [ER+]: Secondary | ICD-10-CM

## 2021-08-27 DIAGNOSIS — Z853 Personal history of malignant neoplasm of breast: Secondary | ICD-10-CM | POA: Insufficient documentation

## 2021-08-27 DIAGNOSIS — C773 Secondary and unspecified malignant neoplasm of axilla and upper limb lymph nodes: Secondary | ICD-10-CM | POA: Insufficient documentation

## 2021-08-27 DIAGNOSIS — C50212 Malignant neoplasm of upper-inner quadrant of left female breast: Secondary | ICD-10-CM | POA: Diagnosis not present

## 2021-08-27 DIAGNOSIS — Z7982 Long term (current) use of aspirin: Secondary | ICD-10-CM | POA: Insufficient documentation

## 2021-08-27 DIAGNOSIS — Z5111 Encounter for antineoplastic chemotherapy: Secondary | ICD-10-CM | POA: Insufficient documentation

## 2021-08-27 DIAGNOSIS — M810 Age-related osteoporosis without current pathological fracture: Secondary | ICD-10-CM | POA: Diagnosis not present

## 2021-08-27 LAB — CBC AND DIFFERENTIAL
HCT: 41 (ref 36–46)
Hemoglobin: 14 (ref 12.0–16.0)
Neutrophils Absolute: 4.13
Platelets: 395 (ref 150–399)
WBC: 8.6

## 2021-08-27 LAB — COMPREHENSIVE METABOLIC PANEL
Albumin: 3.8 (ref 3.5–5.0)
Calcium: 10.4 (ref 8.7–10.7)

## 2021-08-27 LAB — HEPATIC FUNCTION PANEL
ALT: 19 (ref 7–35)
AST: 40 — AB (ref 13–35)
Alkaline Phosphatase: 243 — AB (ref 25–125)
Bilirubin, Total: 0.5

## 2021-08-27 LAB — URINALYSIS, COMPLETE (UACMP) WITH MICROSCOPIC
Bacteria, UA: NONE SEEN
Bilirubin Urine: NEGATIVE
Glucose, UA: NEGATIVE mg/dL
Ketones, ur: NEGATIVE mg/dL
Leukocytes,Ua: NEGATIVE
Nitrite: NEGATIVE
Protein, ur: NEGATIVE mg/dL
Specific Gravity, Urine: 1.014 (ref 1.005–1.030)
pH: 5 (ref 5.0–8.0)

## 2021-08-27 LAB — BASIC METABOLIC PANEL
BUN: 23 — AB (ref 4–21)
CO2: 30 — AB (ref 13–22)
Chloride: 101 (ref 99–108)
Creatinine: 0.7 (ref 0.5–1.1)
Glucose: 124
Potassium: 3.7 (ref 3.4–5.3)
Sodium: 136 — AB (ref 137–147)

## 2021-08-27 LAB — CBC: RBC: 4.49 (ref 3.87–5.11)

## 2021-08-27 NOTE — Assessment & Plan Note (Signed)
Osteoporosis of the forearm. She received denosumab on March 3rd, but did not tolerate this therapy due to jaw pain, weakness and fatigue, and so it was discontinued. We decided to place her on alendronate 70 mg weekly and she istolerating this well. She is no longer on oral calcium.

## 2021-08-27 NOTE — Telephone Encounter (Signed)
Patient has been scheduled for follow-up visit per 08/27/21 los. Pt given an appt calendar with date and time.

## 2021-08-27 NOTE — Assessment & Plan Note (Addendum)
Stage IA triple positive left breast cancer, diagnosed in May 2013. She was treated with left mastectomy in June. She was started on tamoxifen 20 mg daily in July 2013, but this wasRecurrent breast cancer, February 2022, with evidence of left axillary and regional lymph nodes as well as bone lesions. She was subsequently placed on anastrozole which she started on Febuary 28th. Trastuzumab every 3 weeks was recommended and started on March 18th 2022. Anastrozole has been discontinued and she has been switched to fulvestrant injections starting September 1st. discontinued in July 2017 due to concerns regarding endometrial hyperplasia. She completed one year of therapy with trastuzumab in September 2014. Recurrent breast cancer, February 2022, with evidence of left axillary and regional lymph nodes as well as bone lesions. She was subsequently placed on anastrozole which she started on Febuary 28th. Trastuzumab every 3 weeks was recommended and started on March 18th 2022. Anastrozole has been discontinued and she has been switched to fulvestrant injections starting September 1st. She is tolerating treatment well, there has been an increase in her tumor marker from 344 to 706 in January. As this is a significant rise, we will wait to see level from today before making any treatment changes.

## 2021-08-27 NOTE — Progress Notes (Signed)
Patient Care Team: Raina Mina., MD as PCP - General (Internal Medicine) Bjorn Loser, MD as Consulting Physician (Urology) Sater, Nanine Means, MD (Neurology) Magrinat, Virgie Dad, MD (Inactive) as Consulting Physician (Oncology) Joseph Pierini, MD (Inactive) as Consulting Physician (Obstetrics and Gynecology) Tarri Glenn, [place] (Gastroenterology) Bettina Gavia Hilton Cork, MD as Consulting Physician (Cardiology) [name], MD as Consulting Physician (Oncology)  Clinic Day:  08/27/2021  Referring physician: Raina Mina., MD  ASSESSMENT & PLAN:   Assessment & Plan: Breast cancer of upper-inner quadrant of left female breast [facility]) Stage IA triple positive left breast cancer, diagnosed in May 2013.  She was treated with left mastectomy in June.  She was started on tamoxifen 20 mg daily in July 2013, but this wasRecurrent breast cancer, February 2022, with evidence of left axillary and regional lymph nodes as well as bone lesions.  She was subsequently placed on anastrozole which she started on Febuary 28th.  Trastuzumab every 3 weeks was recommended and started on March 18th 2022. Anastrozole has been discontinued and she has been switched to fulvestrant injections starting September 1st. discontinued in July 2017 due to concerns regarding endometrial hyperplasia.  She completed one year of therapy with trastuzumab in September 2014.  Recurrent breast cancer, February 2022, with evidence of left axillary and regional lymph nodes as well as bone lesions.  She was subsequently placed on anastrozole which she started on Febuary 28th.  Trastuzumab every 3 weeks was recommended and started on March 18th 2022. Anastrozole has been discontinued and she has been switched to fulvestrant injections starting September 1st. She is tolerating treatment well, there has been an increase in her tumor marker from 344 to 706 in January. As this is a significant rise, we will wait to see level from  today before making any treatment changes.   Metastasis to bone [facility]) Osteoporosis of the forearm.  She received denosumab on March 3rd, but did not tolerate this therapy due to jaw pain, weakness and fatigue, and so it was discontinued.  We decided to place her on alendronate 70 mg weekly and she is tolerating this well.  She is no longer on oral calcium.    The patient understands the plans discussed today and is in agreement with them.  She knows to contact our office if she develops concerns prior to her next appointment.    Melodye Ped, NP  [facility] [address] [place] [address] Dept: [phone] Dept Fax: [phone]   No orders of the defined types were placed in this encounter.     CHIEF COMPLAINT:  CC: An 88 year old female with history of breast cancer here for 3 week evaluation  Current Treatment:  Trastuzumab, Fulvestrant  INTERVAL HISTORY:  Amy Kline is here today for repeat clinical assessment. She denies fevers or chills. She denies pain. Her appetite is good. Her weight has decreased 5 pounds over last 3 weeks .  I have reviewed the past medical history, past surgical history, social history and family history with the patient and they are unchanged from previous note.  ALLERGIES:  is allergic to neosporin [neomycin-polymyxin-gramicidin], sulfa antibiotics, bacitracin-polymyxin b, bexsero [meningococcal grp b (recomb) vaccine], cefdinir, denosumab, other, and statins.  MEDICATIONS:  Current Outpatient Medications  Medication Sig Dispense Refill   alendronate (FOSAMAX) 70 MG tablet Take 1 tablet (70 mg total) by mouth once a week. Take with a full glass of water on an empty stomach.  12 tablet 3   amLODipine (NORVASC) 2.5 MG tablet Take 1 tablet (2.5 mg total) by mouth daily. 90 tablet 1   aspirin 81 MG tablet Take 81 mg by mouth daily.     carvedilol (COREG) 12.5 MG tablet Take 1 tablet  (12.5 mg total) by mouth 2 (two) times daily with a meal. 180 tablet 3   ergocalciferol (VITAMIN D2) 1.25 MG (50000 UT) capsule Take 50,000 Units by mouth once a week.     furosemide (LASIX) 20 MG tablet Take 20 mg by mouth daily.     ibuprofen (ADVIL) 600 MG tablet Take 600 mg by mouth as needed for headache.     lactose free nutrition (BOOST) LIQD Take 237 mLs by mouth as needed (other). for no appetite     lamoTRIgine (LAMICTAL) 25 MG tablet Take 1 tablet (25 mg total) by mouth 2 (two) times daily. 60 tablet 5   omeprazole (PRILOSEC) 20 MG capsule Take 20 mg by mouth at bedtime.      pyridostigmine (MESTINON) 60 MG tablet TAKE 0.5 TABLETS(30MG  TOTAL) BY MOUTH 3 TIMES DAILY. (Patient taking differently: 2 (two) times daily.) 45 tablet 5   SAVELLA 50 MG TABS tablet Take 50 mg by mouth 2 (two) times daily.     telmisartan-hydrochlorothiazide (MICARDIS HCT) 40-12.5 MG tablet Take 0.5 tablets by mouth daily. Discard unused half of tablet. 45 tablet 2   traMADol (ULTRAM) 50 MG tablet Take 1 tablet (50 mg total) by mouth every 6 (six) hours as needed. 60 tablet 0   No current facility-administered medications for this visit.    HISTORY OF PRESENT ILLNESS:   Oncology History  Breast cancer of upper-inner quadrant of left female breast ([place])  12/02/2011 Cancer Staging   Staging form: Breast, AJCC 7th Edition - Pathologic: Stage IV (TX, NX, M1) - Signed by Chauncey Cruel, MD on 09/02/2020 Specimen type: Core Needle Biopsy Histopathologic type: Infiltrating duct carcinoma, NOS Laterality: Left    05/30/2013 Initial Diagnosis   Breast cancer of upper-inner quadrant of left female breast ([place])   09/27/2020 -  Chemotherapy   Patient is on Treatment Plan : BREAST Trastuzumab SubQ q21d     Metastasis to bone ([place])  07/03/2020 Initial Diagnosis   Metastasis to bone ([place])   09/27/2020 -  Chemotherapy   Patient is on Treatment Plan : BREAST Trastuzumab SubQ q21d         REVIEW OF SYSTEMS:    Constitutional: Denies fevers, chills or abnormal weight loss Eyes: Denies blurriness of vision Ears, nose, mouth, throat, and face: Denies mucositis or sore throat Respiratory: Denies cough, dyspnea or wheezes Cardiovascular: Denies palpitation, chest discomfort or lower extremity swelling Gastrointestinal:  Denies nausea, heartburn or change in bowel habits Skin: Denies abnormal skin rashes Lymphatics: Denies new lymphadenopathy or easy bruising Neurological:Denies numbness, tingling or new weaknesses Behavioral/Psych: Mood is stable, no new changes  All other systems were reviewed with the patient and are negative.   VITALS:  Blood pressure (!) 150/70, pulse 70, temperature 98.4 F (36.9 C), temperature source Oral, resp. rate 20, height 5\' 6"  (1.676 m), weight 164 lb 8 oz (74.6 kg), SpO2 94 %.  Wt Readings from Last 3 Encounters:  08/27/21 164 lb 8 oz (74.6 kg)  08/21/21 169 lb (76.7 kg)  07/18/21 169 lb 12 oz (77 kg)    Body mass index is 26.55 kg/m.  Performance status (ECOG): 2 - Symptomatic, <50% confined to bed  PHYSICAL EXAM:   GENERAL:alert, no  distress and comfortable SKIN: skin color, texture, turgor are normal, no rashes or significant lesions EYES: normal, Conjunctiva are pink and non-injected, sclera clear OROPHARYNX:no exudate, no erythema and lips, buccal mucosa, and tongue normal  NECK: supple, thyroid normal size, non-tender, without nodularity LYMPH:  no palpable lymphadenopathy in the cervical, axillary or inguinal LUNGS: clear to auscultation and percussion with normal breathing effort HEART: regular rate & rhythm and no murmurs and no lower extremity edema ABDOMEN:abdomen soft, non-tender and normal bowel sounds Musculoskeletal:no cyanosis of digits and no clubbing  NEURO: alert & oriented x 3 with fluent speech, no focal motor/sensory deficits  LABORATORY DATA:  I have reviewed the data as listed    Component Value Date/Time   NA 136 (A)  08/27/2021 0000   NA 137 12/28/2016 1358   K 3.7 08/27/2021 0000   K 4.2 12/28/2016 1358   CL 101 08/27/2021 0000   CO2 30 (A) 08/27/2021 0000   CO2 28 12/28/2016 1358   GLUCOSE 100 (H) 04/26/2021 1822   GLUCOSE 92 12/28/2016 1358   BUN 23 (A) 08/27/2021 0000   BUN 17.6 12/28/2016 1358   CREATININE 0.7 08/27/2021 0000   CREATININE 0.90 04/26/2021 1822   CREATININE 0.8 12/28/2016 1358   CALCIUM 10.4 08/27/2021 0000   CALCIUM 11.0 (H) 12/28/2016 1358   PROT 7.7 04/26/2021 1822   PROT 6.7 01/27/2021 1534   PROT 7.3 12/28/2016 1358   ALBUMIN 3.8 08/27/2021 0000   ALBUMIN 3.6 12/28/2016 1358   AST 40 (A) 08/27/2021 0000   AST 19 12/28/2016 1358   ALT 19 08/27/2021 0000   ALT 15 12/28/2016 1358   ALKPHOS 243 (A) 08/27/2021 0000   ALKPHOS 161 (H) 12/28/2016 1358   BILITOT 0.6 04/26/2021 1822   BILITOT 0.49 12/28/2016 1358   GFRNONAA >60 04/26/2021 1822   GFRAA 90 03/04/2020 1607    No results found for: SPEP, UPEP  Lab Results  Component Value Date   WBC 8.6 08/27/2021   NEUTROABS 4.13 08/27/2021   HGB 14.0 08/27/2021   HCT 41 08/27/2021   MCV 92 08/06/2021   PLT 395 08/27/2021      Chemistry      Component Value Date/Time   NA 136 (A) 08/27/2021 0000   NA 137 12/28/2016 1358   K 3.7 08/27/2021 0000   K 4.2 12/28/2016 1358   CL 101 08/27/2021 0000   CO2 30 (A) 08/27/2021 0000   CO2 28 12/28/2016 1358   BUN 23 (A) 08/27/2021 0000   BUN 17.6 12/28/2016 1358   CREATININE 0.7 08/27/2021 0000   CREATININE 0.90 04/26/2021 1822   CREATININE 0.8 12/28/2016 1358   GLU 124 08/27/2021 0000      Component Value Date/Time   CALCIUM 10.4 08/27/2021 0000   CALCIUM 11.0 (H) 12/28/2016 1358   ALKPHOS 243 (A) 08/27/2021 0000   ALKPHOS 161 (H) 12/28/2016 1358   AST 40 (A) 08/27/2021 0000   AST 19 12/28/2016 1358   ALT 19 08/27/2021 0000   ALT 15 12/28/2016 1358   BILITOT 0.6 04/26/2021 1822   BILITOT 0.49 12/28/2016 1358       RADIOGRAPHIC STUDIES: I have  personally reviewed the radiological images as listed and agreed with the findings in the report. No results found.

## 2021-08-28 MED FILL — Trastuzumab-Hyaluronidase-oysk Inj 600-10000 MG-Unit/5ML: SUBCUTANEOUS | Qty: 5 | Status: AC

## 2021-08-29 ENCOUNTER — Inpatient Hospital Stay: Payer: Medicare Other

## 2021-08-29 ENCOUNTER — Other Ambulatory Visit: Payer: Self-pay

## 2021-08-29 VITALS — BP 138/56 | HR 70 | Temp 98.7°F | Resp 20 | Wt 168.0 lb

## 2021-08-29 DIAGNOSIS — Z5111 Encounter for antineoplastic chemotherapy: Secondary | ICD-10-CM | POA: Diagnosis not present

## 2021-08-29 DIAGNOSIS — C50212 Malignant neoplasm of upper-inner quadrant of left female breast: Secondary | ICD-10-CM

## 2021-08-29 DIAGNOSIS — C7951 Secondary malignant neoplasm of bone: Secondary | ICD-10-CM

## 2021-08-29 LAB — URINE CULTURE: Culture: <10000 — AB

## 2021-08-29 MED ORDER — TRASTUZUMAB-HYALURONIDASE-OYSK 600-10000 MG-UNT/5ML ~~LOC~~ SOLN
600.0000 mg | Freq: Once | SUBCUTANEOUS | Status: AC
  Administered 2021-08-29: 600 mg via SUBCUTANEOUS
  Filled 2021-08-29: qty 5

## 2021-08-29 MED ORDER — LORATADINE 10 MG PO TABS: 10.0000 mg | ORAL_TABLET | Freq: Once | ORAL | Status: DC

## 2021-08-29 MED ORDER — ACETAMINOPHEN 325 MG PO TABS: 650.0000 mg | ORAL_TABLET | Freq: Once | ORAL | Status: DC

## 2021-09-01 ENCOUNTER — Encounter: Payer: Self-pay | Admitting: Oncology

## 2021-09-05 ENCOUNTER — Inpatient Hospital Stay: Payer: Medicare Other

## 2021-09-05 ENCOUNTER — Other Ambulatory Visit: Payer: Self-pay

## 2021-09-05 VITALS — BP 135/74 | HR 78 | Temp 97.4°F | Resp 16

## 2021-09-05 DIAGNOSIS — C7951 Secondary malignant neoplasm of bone: Secondary | ICD-10-CM

## 2021-09-05 DIAGNOSIS — Z5111 Encounter for antineoplastic chemotherapy: Secondary | ICD-10-CM | POA: Diagnosis not present

## 2021-09-05 DIAGNOSIS — C50212 Malignant neoplasm of upper-inner quadrant of left female breast: Secondary | ICD-10-CM

## 2021-09-05 MED ORDER — FULVESTRANT 250 MG/5ML IM SOSY
500.0000 mg | PREFILLED_SYRINGE | Freq: Once | INTRAMUSCULAR | Status: AC
  Administered 2021-09-05: 500 mg via INTRAMUSCULAR
  Filled 2021-09-05: qty 10

## 2021-09-09 ENCOUNTER — Other Ambulatory Visit: Payer: Self-pay | Admitting: Oncology

## 2021-09-09 DIAGNOSIS — M810 Age-related osteoporosis without current pathological fracture: Secondary | ICD-10-CM

## 2021-09-11 ENCOUNTER — Ambulatory Visit: Payer: Medicare Other | Admitting: Nurse Practitioner

## 2021-09-11 NOTE — Progress Notes (Signed)
[facility]  [address] [place],  [place]  [address] [phone]  Clinic Day:  09/17/2021  Referring physician: Raina Mina., MD  This document serves as a record of services personally performed by Hosie Poisson, MD. It was created on their behalf by [facility], a trained medical scribe. The creation of this record is based on the scribe's personal observations and the provider's statements to them.  ASSESSMENT & PLAN:   1.  Stage IA triple positive left breast cancer, diagnosed in May 2013.  She was treated with left mastectomy in June.  She was started on tamoxifen 20 mg daily in July 2013, but this was discontinued in July 2017 due to concerns regarding endometrial hyperplasia.  She completed one year of therapy with trastuzumab in September 2014.     2.  Recurrent breast cancer, February 2022, with evidence of left axillary and regional lymph nodes as well as bone lesions.  She was subsequently placed on anastrozole which she started on Febuary 28th.  Trastuzumab every 3 weeks was recommended and started on March 18th 2022. Anastrozole was discontinued due to possible mental status toxicities, which in retrospect were more likely from the Mybetriq. She has been switched to fulvestrant injections starting September 1st.   3.  Significant elevation of the CA 27-29 which fluctuates, but was over 300 by October and 403 by November. It started to come down, from 368 to 344 in early January. However, by late January, it had significantly increased to 706.2. We discussed other therapeutic approaches including going back to the anastrozole, or even better to go to letrozole instead of the fulvestrant. If that is ineffective, another option would be to add in a low dose of palbociclib and I discussed the potential toxicities.  We could also target the HER2 with additional medication with agents such as pertuzumab.  4.  Lower extremity weakness and  episodes of confusion, being evaluated by neurology and felt to be neuropathy.  She is currently on Mestinon 0.5 mg TID. She declines re-evaluation by physical therapy at this time.   5.  Osteoporosis of the forearm.  She received denosumab on March 3rd, but did not tolerate this therapy due to jaw pain, weakness and fatigue, and so it was discontinued.  We decided to place her on alendronate 70 mg weekly and she is tolerating this well.  She is no longer on oral calcium.   6.  Status post splenectomy.  She has started vaccines and had a severe reaction with the meningitis vaccines.  We will not pursue further meningitis vaccines.  7.  Hyponatremia. I advised that she include fluids such as Gatorade or Pedialyte.   8.  Clinical dehydration. I advised that she push fluids.   Her CA 27.29 from late January significantly increased to 706.2. However, I do not want to change her currently therapy regimen based on just one reading. We have repeated this today, and if this has again increased, we will consider switching her therapy. The potential alternative treatment options were reviewed today including letrozole and/or palbociclib as well as pertuzumab. For now, she will proceed with a 18th cycle of trastuzumab this week. She will be due for her next fulvestrant on March 24th, so we will try and make a decision on therapy before that based on the CA 27.29 from today. She really shows no increase in symptoms relating to her bone metastases. She knows to continue alendronate 70 mg weekly. As requested,  we will obtain UA and culture today in view of her recent bouts of weakness. Otherwise, we will plan to see her back in 3 weeks with CBC, CMP and CA 27.29 prior to her 19th cycle of trastuzumab. Both she and her daughter verbalize understanding of and agreement to the plans discussed today. They know to call the office should any new questions or concerns arise.   I provided 30 minutes of face-to-face time  during this this encounter and > 50% was spent counseling as documented under my assessment and plan.    [name], MD [place] [address] [place] [address] Dept: [phone] Dept Fax: [phone]    CHIEF COMPLAINT:  CC: History of recurrent breast cancer  Current Treatment:  Trastuzumab every 3 weeks, alendronate and fulvestrant injections monthly   HISTORY OF PRESENT ILLNESS:  Jalisha D Fickling is a 88 y.o. female who presented as a transfer of care due to recurrent breast cancer. She was originally diagnosed with a stage IA left breast cancer in May 2013 when biopsy confirmed invasive ductal carcinoma, grade 2.  Estrogen and progesterone receptors were positive at 100% and 96%, and HER2 was negative.  She was treated with left mastectomy in June and repeat HER2 was positive by [facility].  She followed with Dr. Lurline Del, and was started on tamoxifen 20 mg daily in July 2013, but this was discontinued in July 2017 due to concerns regarding endometrial hyperplasia.  She completed one year of therapy with trastuzumab in September 2014.  She did have some cardiotoxicity with this therapy, and so this had to be held this for 5-6 weeks.   However, in the summer of 2021 she started having trouble walking with generalized lower extremity weakness and lower back pain.  She also was having episodes of syncope.  MRI brain from August was negative, but MRI lumbar spine from December revealed STIR hyperintense foci at T12-L1 and S1.  MRI pelvis showed similar areas, and in addition, numerous enhancing marrow replacing lesions in the pelvis.  Bone marrow biopsy from January 2022 showed no evidence of carcinoma and a normocellular marrow.  CT chest, abdomen and pelvis showed increased left axillary and subpectoral lymphadenopathy but no other evidence of metastatic disease.  She underwent left axillary lymph node biopsy  on February 18th which confirmed triple positive carcinoma involving fatty tissue.  She was subsequently placed on anastrozole which she started on Febuary 28th, and denosumab was initiated on March 3rd, but she did not tolerate this, so denosumab was stopped.  Trastuzumab was recommended, and initiated on March 18th.  ECHO from September 2021 revealed an ejection fraction between 60-65%.  Tumor markers from January revealed a CA 27-29 of 87, and CEA of 6.65.  Recent CA 27-29 from March is significantly elevated at 221, and CEA was 10.12.  Staging PET imaging from March confirmed metastatic breast cancer as evidenced by hypermetabolic left subpectoral/left axillary lymph nodes and widespread hypermetabolic osseous lesions.  There is mild hypermetabolism within an nonenlarged subcarinal lymph node and both hilar regions.  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  She is status post splenectomy, and has had both Prevnar 13 and pneumovax within the last few years. Bone density scan from March revealed osteoporosis with a T-score of -3.3 of the forearm, and right femur neck measures -2.0, considered osteopenic.  She did have her meningitis  vaccine and experienced severe soreness which is resolving. ECHO from June 30th revealed an EF between 55-60%.  She was placed on alendronate 70 mg weekly.  We stopped the anastrozole and switched her to fulvestrant injections with the 1st one administered September 2nd. New baseline CA 27.29 from September was 299.9, up from 261. She had some worsening mental status changes which seemed to improve when the Vesicare was stopped. There may have been other factors involved such as mild hypercalcemia (up over 11.0), hormonal therapy, and her previous reaction to the meningitis vaccine. ECHO from October 31st revealed normal EF between 60-65%.  INTERVAL HISTORY:  I have reviewed her chart and materials related to her cancer extensively and  collaborated history with the patient. Summary of oncologic history is as follows: Oncology History  Breast cancer of upper-inner quadrant of left female breast ([place])  12/02/2011 Cancer Staging   Staging form: Breast, AJCC 7th Edition - Pathologic: Stage IV (TX, NX, M1) - Signed by Chauncey Cruel, MD on 09/02/2020 Specimen type: Core Needle Biopsy Histopathologic type: Infiltrating duct carcinoma, NOS Laterality: Left    05/30/2013 Initial Diagnosis   Breast cancer of upper-inner quadrant of left female breast ([place])   09/27/2020 -  Chemotherapy   Patient is on Treatment Plan : BREAST Trastuzumab SubQ q21d     Metastasis to bone ([place])  07/03/2020 Initial Diagnosis   Metastasis to bone ([place])   09/27/2020 -  Chemotherapy   Patient is on Treatment Plan : BREAST Trastuzumab SubQ q21d      Adelena is here for routine follow up prior to a 18th cycle of trastuzumab. She states that she is doing fair but has had more weakness this week. She did feel faint during her shower this morning, and states that she will have episodes of intermittent weakness. We will obtain a UA and culture for completeness. She does have pain and paresthesias of the lower extremities, consistent with neuropathy, which occasionally wakes her up during the night. Dr. Arlice Colt had prescribed Lamictal 25 mg, but she and her daughter are hesitant to start this in view of her toxicities to a prior medication. I am in agreement. Last time her CA 27.29 increase significantly from 344 to 706.2. Platelets have increased from 395,000 to 454,000, and white count and hemoglobin are normal. Chemistries are unremarkable except for a BUN of 22, and an alkaline phosphatase of 233, mildly improved. Her  appetite is good, and she has gained 2 pounds since her last visit.  She denies fever, chills or other signs of infection.  She denies nausea, vomiting, bowel issues, or abdominal pain.  She denies sore throat, cough, dyspnea, or chest  pain.  HISTORY:   Allergies:  Allergies  Allergen Reactions   Neosporin [Neomycin-Polymyxin-Gramicidin] Rash   Sulfa Antibiotics Hives    itching   Bacitracin-Polymyxin B    Bexsero [Meningococcal Grp B (Recomb) Vaccine] Other (See Comments)    Pt reported severe arthralgias and fatigue after 1st dose of bexsero and wishes to not receive again.   Cefdinir Nausea And Vomiting   Denosumab     Pain in jaw/head/mouth numb/weak   Other Other (See Comments)    Polysporin causes a rash and swelling Unknown   Statins     Current Medications: Current Outpatient Medications  Medication Sig Dispense Refill   alendronate (FOSAMAX) 70 MG tablet TAKE 1 TABLET BY MOUTH  WEEKLY WITH 8 OZ OF PLAIN  WATER 30 MINUTES BEFORE  FIRST FOOD, DRINK OR  MEDS.  STAY UPRIGHT FOR 30 MINS 12 tablet 3   amLODipine (NORVASC) 2.5 MG tablet Take 1 tablet (2.5 mg total) by mouth daily. 90 tablet 1   aspirin 81 MG tablet Take 81 mg by mouth daily.     carvedilol (COREG) 12.5 MG tablet Take 1 tablet (12.5 mg total) by mouth 2 (two) times daily with a meal. 180 tablet 3   ergocalciferol (VITAMIN D2) 1.25 MG (50000 UT) capsule Take 50,000 Units by mouth once a week.     furosemide (LASIX) 20 MG tablet Take 20 mg by mouth daily.     ibuprofen (ADVIL) 600 MG tablet Take 600 mg by mouth as needed for headache.     lactose free nutrition (BOOST) LIQD Take 237 mLs by mouth as needed (other). for no appetite     lamoTRIgine (LAMICTAL) 25 MG tablet Take 1 tablet (25 mg total) by mouth 2 (two) times daily. 60 tablet 5   omeprazole (PRILOSEC) 20 MG capsule Take 20 mg by mouth at bedtime.      pyridostigmine (MESTINON) 60 MG tablet TAKE 0.5 TABLETS(30MG TOTAL) BY MOUTH 3 TIMES DAILY. (Patient taking differently: 2 (two) times daily.) 45 tablet 5   SAVELLA 50 MG TABS tablet Take 50 mg by mouth 2 (two) times daily.     telmisartan-hydrochlorothiazide (MICARDIS HCT) 40-12.5 MG tablet Take 0.5 tablets by mouth daily. 45 tablet 2    traMADol (ULTRAM) 50 MG tablet Take 1 tablet (50 mg total) by mouth every 6 (six) hours as needed. 60 tablet 0   No current facility-administered medications for this visit.    REVIEW OF SYSTEMS:  Review of Systems  Constitutional:  Negative for appetite change, chills, fatigue, fever and unexpected weight change.  HENT:  Negative.    Eyes: Negative.   Respiratory: Negative.  Negative for chest tightness, cough, hemoptysis, shortness of breath and wheezing.   Cardiovascular: Negative.  Negative for chest pain, leg swelling and palpitations.  Gastrointestinal:  Negative for abdominal distention, abdominal pain, blood in stool, constipation, diarrhea, nausea and vomiting.  Endocrine: Negative.   Genitourinary:  Negative for difficulty urinating, dysuria, frequency and hematuria.   Musculoskeletal:  Positive for back pain (of the lower back, chronic) and gait problem (in a wheelchair). Negative for arthralgias, flank pain and myalgias.  Skin: Negative.   Neurological:  Positive for extremity weakness, gait problem (in a wheelchair) and numbness (neuropathy/paresthesias of the lower extremities). Negative for dizziness, headaches, light-headedness, seizures and speech difficulty.  Hematological: Negative.   Psychiatric/Behavioral: Negative.  Negative for depression and sleep disturbance. The patient is not nervous/anxious.      VITALS:  Blood pressure (!) 178/84, pulse 75, temperature 98 F (36.7 C), resp. rate 16, height '5\' 6"'  (1.676 m), weight 167 lb (75.8 kg), SpO2 99 %.  Wt Readings from Last 3 Encounters:  09/17/21 167 lb (75.8 kg)  08/29/21 168 lb (76.2 kg)  08/27/21 164 lb 8 oz (74.6 kg)    Body mass index is 26.95 kg/m.  Performance status (ECOG): 1 - Symptomatic but completely ambulatory  PHYSICAL EXAM:  Physical Exam Constitutional:      General: She is not in acute distress.    Appearance: Normal appearance. She is normal weight.  HENT:     Head: Normocephalic and  atraumatic.  Eyes:     General: No scleral icterus.    Extraocular Movements: Extraocular movements intact.     Conjunctiva/sclera: Conjunctivae normal.     Pupils: Pupils are equal, round,  and reactive to light.  Cardiovascular:     Rate and Rhythm: Normal rate and regular rhythm.     Pulses: Normal pulses.     Heart sounds: Normal heart sounds. No murmur heard.   No friction rub. No gallop.  Pulmonary:     Effort: Pulmonary effort is normal. No respiratory distress.     Breath sounds: Normal breath sounds.  Abdominal:     General: Bowel sounds are normal. There is no distension.     Palpations: Abdomen is soft. There is no hepatomegaly, splenomegaly or mass.     Tenderness: There is no abdominal tenderness.  Musculoskeletal:        General: Normal range of motion.     Cervical back: Normal range of motion and neck supple.     Right lower leg: No edema.     Left lower leg: No edema.  Lymphadenopathy:     Cervical: No cervical adenopathy.  Skin:    General: Skin is warm and dry.  Neurological:     General: No focal deficit present.     Mental Status: She is alert and oriented to person, place, and time. Mental status is at baseline.  Psychiatric:        Mood and Affect: Mood normal.        Behavior: Behavior normal.        Thought Content: Thought content normal.        Judgment: Judgment normal.     LABS:   CBC Latest Ref Rng & Units 09/17/2021 08/27/2021 08/06/2021  WBC - 9.8 8.6 11.5  Hemoglobin 12.0 - 16.0 14.0 14.0 14.0  Hematocrit 36 - 46 42 41 41  Platelets 150 - 400 K/uL 454(A) 395 363   CMP Latest Ref Rng & Units 09/17/2021 08/27/2021 08/06/2021  Glucose 70 - 99 mg/dL - - -  BUN 4 - 21 22(A) 23(A) 20  Creatinine 0.5 - 1.1 0.9 0.7 0.8  Sodium 137 - 147 134(A) 136(A) 132(A)  Potassium 3.5 - 5.1 mEq/L 4.2 3.7 3.9  Chloride 99 - 108 101 101 97(A)  CO2 13 - 22 28(A) 30(A) 29(A)  Calcium 8.7 - 10.7 10.3 10.4 10.2  Total Protein 6.5 - 8.1 g/dL - - -  Total Bilirubin  0.3 - 1.2 mg/dL - - -  Alkaline Phos 25 - 125 233(A) 243(A) 192(A)  AST 13 - 35 35 40(A) 51(A)  ALT 7 - 35 U/L '20 19 22     ' Lab Results  Component Value Date   CEA1 8.3 (H) 02/19/2021   /  CEA  Date Value Ref Range Status  02/19/2021 8.3 (H) 0.0 - 4.7 ng/mL Final    Comment:    (NOTE)                             Nonsmokers          <3.9                             Smokers             <5.6 [employer] Electrochemiluminescence Immunoassay (ECLIA) Values obtained with different assay methods or kits cannot be used interchangeably.  Results cannot be interpreted as absolute evidence of the presence or absence of malignant disease. Performed At: [facility] [place], [place] [id number] Rush Farmer MD [id number]     Lab  Results  Component Value Date   TNZ182 23.7 07/22/2020      STUDIES:  No results found.    I, Rita Ohara, am acting as scribe for [name], MD  I have reviewed this report as typed by the medical scribe, and it is complete and accurate.

## 2021-09-16 ENCOUNTER — Other Ambulatory Visit: Payer: Self-pay | Admitting: Cardiology

## 2021-09-17 ENCOUNTER — Encounter: Payer: Self-pay | Admitting: Oncology

## 2021-09-17 ENCOUNTER — Other Ambulatory Visit: Payer: Self-pay | Admitting: Oncology

## 2021-09-17 ENCOUNTER — Inpatient Hospital Stay: Payer: Medicare Other | Attending: Oncology | Admitting: Oncology

## 2021-09-17 ENCOUNTER — Inpatient Hospital Stay: Payer: Medicare Other

## 2021-09-17 ENCOUNTER — Other Ambulatory Visit: Payer: Self-pay

## 2021-09-17 VITALS — BP 178/84 | HR 75 | Temp 98.0°F | Resp 16 | Ht 66.0 in | Wt 167.0 lb

## 2021-09-17 DIAGNOSIS — C50212 Malignant neoplasm of upper-inner quadrant of left female breast: Secondary | ICD-10-CM

## 2021-09-17 DIAGNOSIS — Z170 Estrogen receptor positive status [ER+]: Secondary | ICD-10-CM

## 2021-09-17 DIAGNOSIS — E860 Dehydration: Secondary | ICD-10-CM | POA: Diagnosis not present

## 2021-09-17 DIAGNOSIS — Z7983 Long term (current) use of bisphosphonates: Secondary | ICD-10-CM | POA: Insufficient documentation

## 2021-09-17 DIAGNOSIS — Z9012 Acquired absence of left breast and nipple: Secondary | ICD-10-CM | POA: Insufficient documentation

## 2021-09-17 DIAGNOSIS — M810 Age-related osteoporosis without current pathological fracture: Secondary | ICD-10-CM | POA: Diagnosis not present

## 2021-09-17 DIAGNOSIS — R300 Dysuria: Secondary | ICD-10-CM

## 2021-09-17 DIAGNOSIS — C773 Secondary and unspecified malignant neoplasm of axilla and upper limb lymph nodes: Secondary | ICD-10-CM | POA: Insufficient documentation

## 2021-09-17 DIAGNOSIS — Z5111 Encounter for antineoplastic chemotherapy: Secondary | ICD-10-CM | POA: Insufficient documentation

## 2021-09-17 DIAGNOSIS — Z9081 Acquired absence of spleen: Secondary | ICD-10-CM | POA: Diagnosis not present

## 2021-09-17 DIAGNOSIS — C7951 Secondary malignant neoplasm of bone: Secondary | ICD-10-CM | POA: Diagnosis not present

## 2021-09-17 DIAGNOSIS — Z853 Personal history of malignant neoplasm of breast: Secondary | ICD-10-CM | POA: Insufficient documentation

## 2021-09-17 DIAGNOSIS — E871 Hypo-osmolality and hyponatremia: Secondary | ICD-10-CM | POA: Diagnosis not present

## 2021-09-17 LAB — BASIC METABOLIC PANEL
BUN: 22 — AB (ref 4–21)
CO2: 28 — AB (ref 13–22)
Chloride: 101 (ref 99–108)
Creatinine: 0.9 (ref 0.5–1.1)
Glucose: 125
Potassium: 4.2 mEq/L (ref 3.5–5.1)
Sodium: 134 — AB (ref 137–147)

## 2021-09-17 LAB — COMPREHENSIVE METABOLIC PANEL
Albumin: 4 (ref 3.5–5.0)
Calcium: 10.3 (ref 8.7–10.7)

## 2021-09-17 LAB — CBC AND DIFFERENTIAL
HCT: 42 (ref 36–46)
Hemoglobin: 14 (ref 12.0–16.0)
Neutrophils Absolute: 5.78
Platelets: 454 10*3/uL — AB (ref 150–400)
WBC: 9.8

## 2021-09-17 LAB — CBC: RBC: 4.58 (ref 3.87–5.11)

## 2021-09-17 LAB — HEPATIC FUNCTION PANEL
ALT: 20 U/L (ref 7–35)
AST: 35 (ref 13–35)
Alkaline Phosphatase: 233 — AB (ref 25–125)
Bilirubin, Total: 0.5

## 2021-09-18 ENCOUNTER — Telehealth: Payer: Self-pay

## 2021-09-18 LAB — CANCER ANTIGEN 27.29: CA 27.29: 782.8 U/mL — ABNORMAL HIGH (ref 0.0–38.6)

## 2021-09-18 MED FILL — Trastuzumab-Hyaluronidase-oysk Inj 600-10000 MG-Unit/5ML: SUBCUTANEOUS | Qty: 5 | Status: AC

## 2021-09-18 NOTE — Telephone Encounter (Signed)
Patient's daughter notified.

## 2021-09-19 ENCOUNTER — Inpatient Hospital Stay: Payer: Medicare Other

## 2021-09-19 VITALS — BP 133/63 | HR 70 | Temp 97.9°F | Resp 16 | Ht 66.0 in | Wt 167.0 lb

## 2021-09-19 DIAGNOSIS — Z5111 Encounter for antineoplastic chemotherapy: Secondary | ICD-10-CM | POA: Diagnosis not present

## 2021-09-19 DIAGNOSIS — C50212 Malignant neoplasm of upper-inner quadrant of left female breast: Secondary | ICD-10-CM

## 2021-09-19 DIAGNOSIS — C7951 Secondary malignant neoplasm of bone: Secondary | ICD-10-CM

## 2021-09-19 MED ORDER — ACETAMINOPHEN 325 MG PO TABS: 650.0000 mg | ORAL_TABLET | Freq: Once | ORAL | Status: DC

## 2021-09-19 MED ORDER — TRASTUZUMAB-HYALURONIDASE-OYSK 600-10000 MG-UNT/5ML ~~LOC~~ SOLN
600.0000 mg | Freq: Once | SUBCUTANEOUS | Status: AC
  Administered 2021-09-19: 600 mg via SUBCUTANEOUS
  Filled 2021-09-19: qty 5

## 2021-09-19 MED ORDER — LORATADINE 10 MG PO TABS: 10.0000 mg | ORAL_TABLET | Freq: Once | ORAL | Status: DC

## 2021-09-19 NOTE — Patient Instructions (Signed)
[facility]  Discharge Instructions: Thank you for choosing [facility] to provide your oncology and hematology care.  If you have a lab appointment with the Cancer Center, please go directly to the Cancer Center and check in at the registration area.   Wear comfortable clothing and clothing appropriate for easy access to any Portacath or PICC line.   We strive to give you quality time with your provider. You may need to reschedule your appointment if you arrive late (15 or more minutes).  Arriving late affects you and other patients whose appointments are after yours.  Also, if you miss three or more appointments without notifying the office, you may be dismissed from the clinic at the provider's discretion.      For prescription refill requests, have your pharmacy contact our office and allow 72 hours for refills to be completed.    Today you received the following chemotherapy and/or immunotherapy agents Trastuzumab     To help prevent nausea and vomiting after your treatment, we encourage you to take your nausea medication as directed.  BELOW ARE SYMPTOMS THAT SHOULD BE REPORTED IMMEDIATELY: *FEVER GREATER THAN 100.4 F (38 C) OR HIGHER *CHILLS OR SWEATING *NAUSEA AND VOMITING THAT IS NOT CONTROLLED WITH YOUR NAUSEA MEDICATION *UNUSUAL SHORTNESS OF BREATH *UNUSUAL BRUISING OR BLEEDING *URINARY PROBLEMS (pain or burning when urinating, or frequent urination) *BOWEL PROBLEMS (unusual diarrhea, constipation, pain near the anus) TENDERNESS IN MOUTH AND THROAT WITH OR WITHOUT PRESENCE OF ULCERS (sore throat, sores in mouth, or a toothache) UNUSUAL RASH, SWELLING OR PAIN  UNUSUAL VAGINAL DISCHARGE OR ITCHING   Items with * indicate a potential emergency and should be followed up as soon as possible or go to the Emergency Department if any problems should occur.  Please show the CHEMOTHERAPY ALERT CARD or IMMUNOTHERAPY ALERT CARD at check-in to the  Emergency Department and triage nurse.  Should you have questions after your visit or need to cancel or reschedule your appointment, please contact [facility]  Dept: [phone]  and follow the prompts.  Office hours are 8:00 a.m. to 4:30 p.m. Monday - Friday. Please note that voicemails left after 4:00 p.m. may not be returned until the following business day.  We are closed weekends and major holidays. You have access to a nurse at all times for urgent questions. Please call the main number to the clinic Dept: [phone] and follow the prompts.  For any non-urgent questions, you may also contact your provider using MyChart. We now offer e-Visits for anyone 18 and older to request care online for non-urgent symptoms. For details visit [email or web address].   Also download the MyChart app! Go to the app store, search "MyChart", open the app, select [facility], and log in with your MyChart username and password.  Due to Covid, a mask is required upon entering the hospital/clinic. If you do not have a mask, one will be given to you upon arrival. For doctor visits, patients may have 1 support person aged 18 or older with them. For treatment visits, patients cannot have anyone with them due to current Covid guidelines and our immunocompromised population.    

## 2021-09-19 NOTE — Progress Notes (Signed)
1432:PT STABLE AT TIME OF DISCHARGE ?

## 2021-09-22 ENCOUNTER — Encounter: Payer: Self-pay | Admitting: Oncology

## 2021-09-26 ENCOUNTER — Telehealth: Payer: Self-pay

## 2021-09-26 ENCOUNTER — Ambulatory Visit: Payer: Medicare Other

## 2021-09-26 NOTE — Telephone Encounter (Signed)
Ulice Dash, pharmacist: I moved the appt to 4/3 at 2pm.  Will you let her know Amy Kline? ? ?Dr [name]: yes, that would be fine ? ?Pt's daughter,[name], called to ask if it would be ok to move infusion appt from 3/31/ to Monday, 10/13/21? ?

## 2021-10-03 ENCOUNTER — Inpatient Hospital Stay: Payer: Medicare Other

## 2021-10-03 VITALS — BP 125/81 | HR 79 | Temp 98.3°F | Resp 18 | Ht 66.0 in | Wt 167.0 lb

## 2021-10-03 DIAGNOSIS — Z5111 Encounter for antineoplastic chemotherapy: Secondary | ICD-10-CM | POA: Diagnosis not present

## 2021-10-03 DIAGNOSIS — C50212 Malignant neoplasm of upper-inner quadrant of left female breast: Secondary | ICD-10-CM

## 2021-10-03 MED ORDER — FULVESTRANT 250 MG/5ML IM SOSY
500.0000 mg | PREFILLED_SYRINGE | Freq: Once | INTRAMUSCULAR | Status: AC
  Administered 2021-10-03: 500 mg via INTRAMUSCULAR
  Filled 2021-10-03: qty 10

## 2021-10-03 NOTE — Patient Instructions (Signed)
Fulvestrant injection °What is this medication? °FULVESTRANT (ful VES trant) blocks the effects of estrogen. It is used to treat breast cancer. °This medicine may be used for other purposes; ask your health care provider or pharmacist if you have questions. °COMMON BRAND NAME(S): FASLODEX °What should I tell my care team before I take this medication? °They need to know if you have any of these conditions: °bleeding disorders °liver disease °low blood counts, like low white cell, platelet, or red cell counts °an unusual or allergic reaction to fulvestrant, other medicines, foods, dyes, or preservatives °pregnant or trying to get pregnant °breast-feeding °How should I use this medication? °This medicine is for injection into a muscle. It is usually given by a health care professional in a hospital or clinic setting. °Talk to your pediatrician regarding the use of this medicine in children. Special care may be needed. °Overdosage: If you think you have taken too much of this medicine contact a poison control center or emergency room at once. °NOTE: This medicine is only for you. Do not share this medicine with others. °What if I miss a dose? °It is important not to miss your dose. Call your doctor or health care professional if you are unable to keep an appointment. °What may interact with this medication? °medicines that treat or prevent blood clots like warfarin, enoxaparin, dalteparin, apixaban, dabigatran, and rivaroxaban °This list may not describe all possible interactions. Give your health care provider a list of all the medicines, herbs, non-prescription drugs, or dietary supplements you use. Also tell them if you smoke, drink alcohol, or use illegal drugs. Some items may interact with your medicine. °What should I watch for while using this medication? °Your condition will be monitored carefully while you are receiving this medicine. You will need important blood work done while you are taking this  medicine. °Do not become pregnant while taking this medicine or for at least 1 year after stopping it. Women of child-bearing potential will need to have a negative pregnancy test before starting this medicine. Women should inform their doctor if they wish to become pregnant or think they might be pregnant. There is a potential for serious side effects to an unborn child. Men should inform their doctors if they wish to father a child. This medicine may lower sperm counts. Talk to your health care professional or pharmacist for more information. Do not breast-feed an infant while taking this medicine or for 1 year after the last dose. °What side effects may I notice from receiving this medication? °Side effects that you should report to your doctor or health care professional as soon as possible: °allergic reactions like skin rash, itching or hives, swelling of the face, lips, or tongue °feeling faint or lightheaded, falls °pain, tingling, numbness, or weakness in the legs °signs and symptoms of infection like fever or chills; cough; flu-like symptoms; sore throat °vaginal bleeding °Side effects that usually do not require medical attention (report to your doctor or health care professional if they continue or are bothersome): °aches, pains °constipation °diarrhea °headache °hot flashes °nausea, vomiting °pain at site where injected °stomach pain °This list may not describe all possible side effects. Call your doctor for medical advice about side effects. You may report side effects to FDA at 1-800-FDA-1088. °Where should I keep my medication? °This drug is given in a hospital or clinic and will not be stored at home. °NOTE: This sheet is a summary. It may not cover all possible information. If you have   questions about this medicine, talk to your doctor, pharmacist, or health care provider. °© 2022 Elsevier/Gold Standard (2017-10-12 00:00:00) ° °

## 2021-10-03 NOTE — Progress Notes (Signed)
1436:PT STABLE AT TIME OF DISCHARGE ?

## 2021-10-07 NOTE — Progress Notes (Signed)
?[name]  ?[address] ?[place],  [place]  [address] ?[phone] ? ?Clinic Day:  10/08/21 ? ?Referring physician: Raina Mina., MD ? ? ?ASSESSMENT & PLAN:  ? ?1.  Stage IA triple positive left breast cancer, diagnosed in May 2013.  She was treated with left mastectomy in June.  She was started on tamoxifen 20 mg daily in July 2013, but this was discontinued in July 2017 due to concerns regarding endometrial hyperplasia.  She completed one year of therapy with trastuzumab in September 2014.   ?  ?2.  Recurrent breast cancer, February 2022, with evidence of left axillary and regional lymph nodes as well as bone lesions.  She was subsequently placed on anastrozole which she started on Febuary 28th.  Trastuzumab every 3 weeks was recommended and started on March 18th 2022. Anastrozole was discontinued due to possible mental status toxicities, which in retrospect were more likely from the Mybetriq. She has been switched to fulvestrant injections starting September 1st. ?  ?3.  Significant elevation of the CA 27-29 which fluctuates, but was over 300 by October and 403 by November. It started to come down, from 368 to 344 in early January. However, by late January, it had significantly increased to 706.2.  The last reading was up to 782.8 and we repeated that today.  We discussed other therapeutic approaches including going back to the anastrozole, or even better to go to letrozole instead of the fulvestrant. If that is ineffective, another option would be to add in a low dose of palbociclib and I discussed the potential toxicities.  We could also target the HER2 with additional medication with agents such as pertuzumab.  They are not interested in pursuing aggressive IV chemotherapy. ? ?4.  Lower extremity weakness and episodes of confusion, being evaluated by neurology and felt to be neuropathy.  She is currently on Mestinon 0.5 mg TID. She declines re-evaluation by physical  therapy at this time. ?  ?5.  Osteoporosis of the forearm.  She received denosumab on March 3rd, but did not tolerate this therapy due to jaw pain, weakness and fatigue, and so it was discontinued.  We decided to place her on alendronate 70 mg weekly and she is tolerating this well.  She is no longer on oral calcium. ?  ?6.  Status post splenectomy.  She has started vaccines and had a severe reaction with the meningitis vaccines.  We will not pursue further meningitis vaccines. ? ?7.  Hyponatremia.  This has been chronic and generally stable.  I advised that she include fluids such as Gatorade or Pedialyte.  ? ?8.  Worsening thrombocytosis.  I feel this is reactive but we will continue to monitor it closely. ? ?Her CA 27.29 from late January significantly increased to 706.2 and was 782.8 in early March. However, I do not want to change her currently therapy regimen based on this since she is clinically stable.  We have repeated this today, and if this has again increased, we will consider switching her therapy. The potential alternative treatment options were reviewed again today including letrozole and/or palbociclib as well as pertuzumab. For now, she will proceed with a 19th cycle of trastuzumab this week. She will be due for her next fulvestrant on March 24th, so we will try and make a decision on therapy before that based on the CA 27.29 from today. She really shows no increase in symptoms relating to her bone metastases. She knows to continue  alendronate 70 mg weekly. Otherwise, we will plan to see her back in 3 weeks with CBC, CMP and CA 27.29 prior to her 20th cycle of trastuzumab.  Her last echocardiogram was August 22, 2021 and I suggested that she have the next one at [facility] cardiology going forward.  Both she and her daughter verbalize understanding of and agreement to the plans discussed today. They know to call the office should any new questions or concerns arise.  ? ?I provided 20 minutes of  face-to-face time during this this encounter and > 50% was spent counseling as documented under my assessment and plan.  ? ?ADDENDUM: Her latest CA 27-29 went down a little to 777. ? ?[name], MD ?[facility] ?[address] ?[place] [address] ?Dept: [phone] ?Dept Fax: [phone]  ? ? ?CHIEF COMPLAINT:  ?CC: History of recurrent breast cancer ? ?Current Treatment:  Trastuzumab every 3 weeks, alendronate and fulvestrant injections monthly ? ? ?HISTORY OF PRESENT ILLNESS:  ?Amy Kline is a 88 y.o. female who presented as a transfer of care due to recurrent breast cancer. She was originally diagnosed with a stage IA left breast cancer in May 2013 when biopsy confirmed invasive ductal carcinoma, grade 2.  Estrogen and progesterone receptors were positive at 100% and 96%, and HER2 was negative.  She was treated with left mastectomy in June and repeat HER2 was positive by [facility].  She followed with Dr. Lurline Del, and was started on tamoxifen 20 mg daily in July 2013, but this was discontinued in July 2017 due to concerns regarding endometrial hyperplasia.  She completed one year of therapy with trastuzumab in September 2014.  She did have some cardiotoxicity with this therapy, and so this had to be held this for 5-6 weeks. ?  ?However, in the summer of 2021 she started having trouble walking with generalized lower extremity weakness and lower back pain.  She also was having episodes of syncope.  MRI brain from August was negative, but MRI lumbar spine from December revealed STIR hyperintense foci at T12-L1 and S1.  MRI pelvis showed similar areas, and in addition, numerous enhancing marrow replacing lesions in the pelvis.  Bone marrow biopsy from January 2022 showed no evidence of carcinoma and a normocellular marrow.  CT chest, abdomen and pelvis showed increased left axillary and subpectoral lymphadenopathy but no other  evidence of metastatic disease.  She underwent left axillary lymph node biopsy on February 18th which confirmed triple positive carcinoma involving fatty tissue.  She was subsequently placed on anastrozole which she started on Febuary 28th, and denosumab was initiated on March 3rd, but she did not tolerate this, so denosumab was stopped.  Trastuzumab was recommended, and initiated on March 18th.  ECHO from September 2021 revealed an ejection fraction between 60-65%.  Tumor markers from January revealed a CA 27-29 of 87, and CEA of 6.65.  Recent CA 27-29 from March is significantly elevated at 221, and CEA was 10.12.  Staging PET imaging from March confirmed metastatic breast cancer as evidenced by hypermetabolic left subpectoral/left axillary lymph nodes and widespread hypermetabolic osseous lesions.  There is mild hypermetabolism within an nonenlarged subcarinal lymph node and both hilar regions.  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  She is status post splenectomy, and has had both Prevnar 13 and pneumovax within the last few years. Bone density scan from March revealed  osteoporosis with a T-score of -3.3 of the forearm, and right femur neck measures -2.0, considered osteopenic.  She did have her meningitis vaccine and experienced severe soreness which is resolving. ECHO from June 30th revealed an EF between 55-60%.  She was placed on alendronate 70 mg weekly.  We stopped the anastrozole and switched her to fulvestrant injections with the 1st one administered September 2nd. New baseline CA 27.29 from September was 299.9, up from 261. She had some worsening mental status changes which seemed to improve when the Vesicare was stopped. There may have been other factors involved such as mild hypercalcemia (up over 11.0), hormonal therapy, and her previous reaction to the meningitis vaccine. ECHO from October 31st revealed normal EF between 60-65%. ? ?INTERVAL HISTORY:   ?I have reviewed her chart and materials related to her cancer extensively and collaborated history with the patient. Summary of oncologic history is as follows: ?Oncology History  ?Breast cancer of upper-inner q

## 2021-10-08 ENCOUNTER — Other Ambulatory Visit: Payer: Self-pay

## 2021-10-08 ENCOUNTER — Other Ambulatory Visit: Payer: Self-pay | Admitting: Oncology

## 2021-10-08 ENCOUNTER — Inpatient Hospital Stay: Payer: Medicare Other

## 2021-10-08 ENCOUNTER — Encounter: Payer: Self-pay | Admitting: Oncology

## 2021-10-08 ENCOUNTER — Inpatient Hospital Stay (INDEPENDENT_AMBULATORY_CARE_PROVIDER_SITE_OTHER): Payer: Medicare Other | Admitting: Oncology

## 2021-10-08 VITALS — BP 165/73 | HR 67 | Temp 97.5°F | Resp 18 | Ht 66.0 in | Wt 167.0 lb

## 2021-10-08 DIAGNOSIS — C7951 Secondary malignant neoplasm of bone: Secondary | ICD-10-CM

## 2021-10-08 DIAGNOSIS — C50212 Malignant neoplasm of upper-inner quadrant of left female breast: Secondary | ICD-10-CM

## 2021-10-08 DIAGNOSIS — Z170 Estrogen receptor positive status [ER+]: Secondary | ICD-10-CM

## 2021-10-08 DIAGNOSIS — Z5111 Encounter for antineoplastic chemotherapy: Secondary | ICD-10-CM | POA: Diagnosis not present

## 2021-10-08 LAB — HEPATIC FUNCTION PANEL
ALT: 32 U/L (ref 7–35)
AST: 64 — AB (ref 13–35)
Alkaline Phosphatase: 285 — AB (ref 25–125)
Bilirubin, Total: 0.5

## 2021-10-08 LAB — CBC AND DIFFERENTIAL
HCT: 39 (ref 36–46)
Hemoglobin: 12.9 (ref 12.0–16.0)
Neutrophils Absolute: 5.09
Platelets: 489 10*3/uL — AB (ref 150–400)
WBC: 9.6

## 2021-10-08 LAB — COMPREHENSIVE METABOLIC PANEL
Albumin: 3.8 (ref 3.5–5.0)
Calcium: 9.9 (ref 8.7–10.7)

## 2021-10-08 LAB — BASIC METABOLIC PANEL
BUN: 24 — AB (ref 4–21)
CO2: 29 — AB (ref 13–22)
Chloride: 102 (ref 99–108)
Creatinine: 0.7 (ref 0.5–1.1)
Glucose: 103
Potassium: 3.8 mEq/L (ref 3.5–5.1)
Sodium: 134 — AB (ref 137–147)

## 2021-10-08 LAB — CBC: RBC: 4.31 (ref 3.87–5.11)

## 2021-10-09 ENCOUNTER — Encounter: Payer: Self-pay | Admitting: Oncology

## 2021-10-09 LAB — CANCER ANTIGEN 27.29: CA 27.29: 772.3 U/mL — ABNORMAL HIGH (ref 0.0–38.6)

## 2021-10-10 ENCOUNTER — Ambulatory Visit: Payer: Medicare Other

## 2021-10-13 ENCOUNTER — Inpatient Hospital Stay: Payer: Medicare Other | Attending: Oncology

## 2021-10-13 VITALS — BP 133/57 | HR 70 | Temp 97.6°F | Resp 19 | Wt 163.1 lb

## 2021-10-13 DIAGNOSIS — C773 Secondary and unspecified malignant neoplasm of axilla and upper limb lymph nodes: Secondary | ICD-10-CM | POA: Diagnosis present

## 2021-10-13 DIAGNOSIS — E871 Hypo-osmolality and hyponatremia: Secondary | ICD-10-CM | POA: Diagnosis not present

## 2021-10-13 DIAGNOSIS — C7951 Secondary malignant neoplasm of bone: Secondary | ICD-10-CM | POA: Diagnosis not present

## 2021-10-13 DIAGNOSIS — Z170 Estrogen receptor positive status [ER+]: Secondary | ICD-10-CM | POA: Diagnosis not present

## 2021-10-13 DIAGNOSIS — Z9081 Acquired absence of spleen: Secondary | ICD-10-CM | POA: Insufficient documentation

## 2021-10-13 DIAGNOSIS — D75839 Thrombocytosis, unspecified: Secondary | ICD-10-CM | POA: Insufficient documentation

## 2021-10-13 DIAGNOSIS — C50212 Malignant neoplasm of upper-inner quadrant of left female breast: Secondary | ICD-10-CM | POA: Insufficient documentation

## 2021-10-13 DIAGNOSIS — Z79899 Other long term (current) drug therapy: Secondary | ICD-10-CM | POA: Insufficient documentation

## 2021-10-13 DIAGNOSIS — Z9012 Acquired absence of left breast and nipple: Secondary | ICD-10-CM | POA: Diagnosis not present

## 2021-10-13 DIAGNOSIS — Z5111 Encounter for antineoplastic chemotherapy: Secondary | ICD-10-CM | POA: Insufficient documentation

## 2021-10-13 DIAGNOSIS — Z7982 Long term (current) use of aspirin: Secondary | ICD-10-CM | POA: Diagnosis not present

## 2021-10-13 DIAGNOSIS — M810 Age-related osteoporosis without current pathological fracture: Secondary | ICD-10-CM | POA: Insufficient documentation

## 2021-10-13 MED ORDER — SODIUM CHLORIDE 0.9 % IV SOLN: Freq: Once | INTRAVENOUS | Status: DC

## 2021-10-13 MED ORDER — SODIUM CHLORIDE 0.9% FLUSH: 10.0000 mL | INTRAVENOUS | Status: DC | PRN

## 2021-10-13 MED ORDER — HEPARIN SOD (PORK) LOCK FLUSH 100 UNIT/ML IV SOLN: 500.0000 [IU] | Freq: Once | INTRAVENOUS | Status: DC | PRN

## 2021-10-13 MED ORDER — ACETAMINOPHEN 325 MG PO TABS: 650.0000 mg | ORAL_TABLET | Freq: Once | ORAL | Status: DC

## 2021-10-13 MED ORDER — TRASTUZUMAB-HYALURONIDASE-OYSK 600-10000 MG-UNT/5ML ~~LOC~~ SOLN
600.0000 mg | Freq: Once | SUBCUTANEOUS | Status: AC
  Administered 2021-10-13: 600 mg via SUBCUTANEOUS
  Filled 2021-10-13: qty 5

## 2021-10-13 MED ORDER — LORATADINE 10 MG PO TABS: 10.0000 mg | ORAL_TABLET | Freq: Once | ORAL | Status: DC

## 2021-10-13 NOTE — Patient Instructions (Signed)
Trastuzumab; Hyaluronidase injection ?What is this medication? ?TRASTUZUMAB; HYALURONIDASE (tras TOO zoo mab / hye al ur ON i dase) is used to treat breast cancer and stomach cancer. Trastuzumab is a monoclonal antibody. Hyaluronidase is used to improve the effects of trastuzumab. ?This medicine may be used for other purposes; ask your health care provider or pharmacist if you have questions. ?COMMON BRAND NAME(S): HERCEPTIN HYLECTA ?What should I tell my care team before I take this medication? ?They need to know if you have any of these conditions: ?heart disease ?heart failure ?lung or breathing disease, like asthma ?an unusual or allergic reaction to trastuzumab, or other medications, foods, dyes, or preservatives ?pregnant or trying to get pregnant ?breast-feeding ?How should I use this medication? ?This medicine is for injection under the skin. It is given by a health care professional in a hospital or clinic setting. ?Talk to your pediatrician regarding the use of this medicine in children. This medicine is not approved for use in children. ?Overdosage: If you think you have taken too much of this medicine contact a poison control center or emergency room at once. ?NOTE: This medicine is only for you. Do not share this medicine with others. ?What if I miss a dose? ?It is important not to miss a dose. Call your doctor or health care professional if you are unable to keep an appointment. ?What may interact with this medication? ?This medicine may interact with the following medications: ?certain types of chemotherapy, such as daunorubicin, doxorubicin, epirubicin, and idarubicin ?This list may not describe all possible interactions. Give your health care provider a list of all the medicines, herbs, non-prescription drugs, or dietary supplements you use. Also tell them if you smoke, drink alcohol, or use illegal drugs. Some items may interact with your medicine. ?What should I watch for while using this  medication? ?Visit your doctor for checks on your progress. Report any side effects. Continue your course of treatment even though you feel ill unless your doctor tells you to stop. ?Call your doctor or health care professional for advice if you get a fever, chills or sore throat, or other symptoms of a cold or flu. Do not treat yourself. Try to avoid being around people who are sick. ?You may experience fever, chills and shaking during your first infusion. These effects are usually mild and can be treated with other medicines. Report any side effects during the infusion to your health care professional. Fever and chills usually do not happen with later infusions. ?Do not become pregnant while taking this medicine or for 7 months after stopping it. Women should inform their doctor if they wish to become pregnant or think they might be pregnant. Women of child-bearing potential will need to have a negative pregnancy test before starting this medicine. There is a potential for serious side effects to an unborn child. Talk to your health care professional or pharmacist for more information. Do not breast-feed an infant while taking this medicine or for 7 months after stopping it. ?What side effects may I notice from receiving this medication? ?Side effects that you should report to your doctor or health care professional as soon as possible: ?allergic reactions like skin rash, itching or hives, swelling of the face, lips, or tongue ?breathing problems ?chest pain or palpitations ?cough ?fever ?general ill feeling or flu-like symptoms ?signs of worsening heart failure like breathing problems; swelling in your legs and feet ?Side effects that usually do not require medical attention (report these to your doctor   or health care professional if they continue or are bothersome): ?bone pain ?changes in taste ?diarrhea ?joint pain ?nausea/vomiting ?unusually weak or tired ?weight loss ?This list may not describe all possible  side effects. Call your doctor for medical advice about side effects. You may report side effects to FDA at 1-800-FDA-1088. ?Where should I keep my medication? ?This drug is given in a hospital or clinic and will not be stored at home. ?NOTE: This sheet is a summary. It may not cover all possible information. If you have questions about this medicine, talk to your doctor, pharmacist, or health care provider. ?? 2022 Elsevier/Gold Standard (2017-11-15 00:00:00) ? ?

## 2021-10-18 ENCOUNTER — Emergency Department (HOSPITAL_BASED_OUTPATIENT_CLINIC_OR_DEPARTMENT_OTHER): Payer: Medicare Other

## 2021-10-18 ENCOUNTER — Other Ambulatory Visit: Payer: Self-pay

## 2021-10-18 ENCOUNTER — Encounter (HOSPITAL_BASED_OUTPATIENT_CLINIC_OR_DEPARTMENT_OTHER): Payer: Self-pay

## 2021-10-18 ENCOUNTER — Emergency Department (HOSPITAL_BASED_OUTPATIENT_CLINIC_OR_DEPARTMENT_OTHER)
Admission: EM | Admit: 2021-10-18 | Discharge: 2021-10-18 | Disposition: A | Discharge status: Home / Self Care | Payer: Medicare Other | Attending: Emergency Medicine | Admitting: Emergency Medicine

## 2021-10-18 DIAGNOSIS — Z7982 Long term (current) use of aspirin: Secondary | ICD-10-CM | POA: Insufficient documentation

## 2021-10-18 DIAGNOSIS — R079 Chest pain, unspecified: Secondary | ICD-10-CM | POA: Diagnosis present

## 2021-10-18 DIAGNOSIS — J8410 Pulmonary fibrosis, unspecified: Secondary | ICD-10-CM | POA: Diagnosis not present

## 2021-10-18 DIAGNOSIS — R0789 Other chest pain: Secondary | ICD-10-CM | POA: Diagnosis not present

## 2021-10-18 DIAGNOSIS — Z853 Personal history of malignant neoplasm of breast: Secondary | ICD-10-CM | POA: Diagnosis not present

## 2021-10-18 LAB — TROPONIN I (HIGH SENSITIVITY)
Troponin I (High Sensitivity): 5 ng/L (ref <18)
Troponin I (High Sensitivity): 4 ng/L (ref <18)

## 2021-10-18 LAB — BASIC METABOLIC PANEL
Anion gap: 9 (ref 5–15)
BUN: 28 mg/dL — ABNORMAL HIGH (ref 8–23)
CO2: 25 mmol/L (ref 22–32)
Calcium: 9.9 mg/dL (ref 8.9–10.3)
Chloride: 99 mmol/L (ref 98–111)
Creatinine, Ser: 1.05 mg/dL — ABNORMAL HIGH (ref 0.44–1.00)
GFR, Estimated: 51 mL/min — ABNORMAL LOW (ref >60)
Glucose, Bld: 112 mg/dL — ABNORMAL HIGH (ref 70–99)
Potassium: 3.7 mmol/L (ref 3.5–5.1)
Sodium: 133 mmol/L — ABNORMAL LOW (ref 135–145)

## 2021-10-18 LAB — CBC
HCT: 40.5 % (ref 36.0–46.0)
Hemoglobin: 13.9 g/dL (ref 12.0–15.0)
MCH: 31 pg (ref 26.0–34.0)
MCHC: 34.3 g/dL (ref 30.0–36.0)
MCV: 90.2 fL (ref 80.0–100.0)
Platelets: 537 10*3/uL — ABNORMAL HIGH (ref 150–400)
RBC: 4.49 MIL/uL (ref 3.87–5.11)
RDW: 14.1 % (ref 11.5–15.5)
WBC: 11.9 10*3/uL — ABNORMAL HIGH (ref 4.0–10.5)
nRBC: 0 % (ref 0.0–0.2)

## 2021-10-18 MED ORDER — SODIUM CHLORIDE 0.9 % IV BOLUS
1000.0000 mL | Freq: Once | INTRAVENOUS | Status: AC
  Administered 2021-10-18: 1000 mL via INTRAVENOUS

## 2021-10-18 MED ORDER — IOHEXOL 350 MG/ML SOLN
100.0000 mL | Freq: Once | INTRAVENOUS | Status: AC | PRN
  Administered 2021-10-18: 75 mL via INTRAVENOUS

## 2021-10-18 NOTE — Discharge Instructions (Addendum)
You were seen in the emergency department for left-sided chest pain.  You had blood work EKG chest x-ray and a CAT scan of your chest that did not show an obvious explanation for your symptoms.  Please continue your regular medications and follow-up with your primary care doctor.  Included below is the report of the CAT scan that may require some further follow-up. ? ?1. Negative examination for pulmonary embolism.  ?2. Mild pulmonary fibrosis in a pattern with apical to basal  ?gradient, featuring areas of subpleural bronchiolectasis at the lung  ?bases, incompletely characterized by this expiratory angiographic  ?examination.  ?3. Coronary artery disease.  ?4. Similar appearance of enlarged metastatic left axillary and  ?subpectoral lymph nodes as well as sclerotic osseous metastatic  ?disease, in keeping with known metastatic breast malignancy.  ?   ?Aortic Atherosclerosis (ICD10-I70.0).  ? ?

## 2021-10-18 NOTE — ED Provider Notes (Signed)
?[facility] ?Provider Note ? ? ?CSN: [id number] ?Arrival date & time: 10/18/21  1404 ? ?  ? ?History ? ?Chief Complaint  ?Patient presents with  ? Chest Pain  ? ? ?Amy Kline is a 88 y.o. female.  She has a history of breast cancer and is getting Trastuzumab injections.  Received 1 about 5 days ago.  Starting 2 days ago she had some left-sided chest pain.  Worse with movement and deep breaths.  She denies feeling short of breath no nausea or vomiting.  Her husband gave her a Pepsi which seemed to help her symptoms a little bit.  Currently not having any pain.  Brought in by her daughters for this pain.  She has had this injection before although usually receives it as an IV infusion this time it was a Depo shot.  She has a history of cardiac problems and had to get echoes due to decreased cardiac function due to chemotherapy.  Denies any fevers chills cough.  ? ?The history is provided by the patient.  ?Chest Pain ?Pain location:  L chest ?Pain quality: sharp   ?Pain severity:  Severe ?Onset quality:  Gradual ?Duration:  2 days ?Timing:  Constant ?Progression:  Resolved ?Chronicity:  New ?Context: movement   ?Relieved by:  None tried ?Worsened by:  Movement and deep breathing ?Ineffective treatments: Ibuprofen. ?Associated symptoms: no abdominal pain, no diaphoresis, no fever, no numbness, no shortness of breath, no vomiting and no weakness   ? ?  ? ?Home Medications ?Prior to Admission medications   ?Medication Sig Start Date End Date Taking? Authorizing Provider  ?alendronate (FOSAMAX) 70 MG tablet TAKE 1 TABLET BY MOUTH  WEEKLY WITH 8 OZ OF PLAIN  WATER 30 MINUTES BEFORE  FIRST FOOD, DRINK OR MEDS.  STAY UPRIGHT FOR 30 MINS 09/10/21   [name], MD  ?amLODipine (NORVASC) 2.5 MG tablet Take 1 tablet (2.5 mg total) by mouth daily. 06/03/21 09/01/21  Richardo Priest, MD  ?aspirin 81 MG tablet Take 81 mg by mouth daily.    [provider]  ?carvedilol (COREG) 12.5  MG tablet Take 1 tablet (12.5 mg total) by mouth 2 (two) times daily with a meal. 05/02/21   Munley, Hilton Cork, MD  ?ergocalciferol (VITAMIN D2) 1.25 MG (50000 UT) capsule Take 50,000 Units by mouth once a week.    [provider]  ?furosemide (LASIX) 20 MG tablet Take 20 mg by mouth daily. 12/14/20   [provider]  ?ibuprofen (ADVIL) 600 MG tablet Take 600 mg by mouth as needed for headache.    [provider]  ?lactose free nutrition (BOOST) LIQD Take 237 mLs by mouth as needed (other). for no appetite    [provider]  ?lamoTRIgine (LAMICTAL) 25 MG tablet Take 1 tablet (25 mg total) by mouth 2 (two) times daily. 08/21/21   Sater, Nanine Means, MD  ?omeprazole (PRILOSEC) 20 MG capsule Take 20 mg by mouth at bedtime.  11/14/12   [provider]  ?pyridostigmine (MESTINON) 60 MG tablet TAKE 0.5 TABLETS('30MG'$  TOTAL) BY MOUTH 3 TIMES DAILY. ?Patient taking differently: 2 (two) times daily. 03/04/21   Sater, Nanine Means, MD  ?SAVELLA 50 MG TABS tablet Take 50 mg by mouth 2 (two) times daily. 08/05/20   [provider]  ?telmisartan-hydrochlorothiazide (MICARDIS HCT) 40-12.5 MG tablet Take 0.5 tablets by mouth daily. 09/16/21   Richardo Priest, MD  ?traMADol (ULTRAM) 50 MG tablet Take 1 tablet (50 mg  total) by mouth every 6 (six) hours as needed. 12/18/20   Melodye Ped, NP  ?   ? ?Allergies    ?Neosporin [neomycin-polymyxin-gramicidin], Sulfa antibiotics, Bacitracin-polymyxin b, Bexsero [meningococcal grp b (recomb) vaccine], Cefdinir, Denosumab, Other, and Statins   ? ?Review of Systems   ?Review of Systems  ?Constitutional:  Negative for diaphoresis and fever.  ?HENT:  Negative for sore throat.   ?Respiratory:  Negative for shortness of breath.   ?Cardiovascular:  Positive for chest pain.  ?Gastrointestinal:  Negative for abdominal pain and vomiting.  ?Genitourinary:  Negative for dysuria.  ?Skin:  Negative for rash.  ?Neurological:  Negative for weakness and numbness.   ? ?Physical Exam ?Updated Vital Signs ?BP (!) 114/59   Pulse 70   Temp 97.8 ?F (36.6 ?C) (Oral)   Resp 20   Ht '5\' 6"'$  (1.676 m)   Wt 73.9 kg   SpO2 96%   BMI 26.31 kg/m?  ?Physical Exam ?Vitals and nursing note reviewed.  ?Constitutional:   ?   General: She is not in acute distress. ?   Appearance: She is well-developed.  ?HENT:  ?   Head: Normocephalic and atraumatic.  ?Eyes:  ?   Conjunctiva/sclera: Conjunctivae normal.  ?Cardiovascular:  ?   Rate and Rhythm: Normal rate and regular rhythm.  ?   Heart sounds: Normal heart sounds. No murmur heard. ?Pulmonary:  ?   Effort: Pulmonary effort is normal. No respiratory distress.  ?   Breath sounds: Normal breath sounds.  ?Abdominal:  ?   Palpations: Abdomen is soft.  ?   Tenderness: There is no abdominal tenderness.  ?Musculoskeletal:     ?   General: No swelling. Normal range of motion.  ?   Cervical back: Neck supple.  ?   Right lower leg: No tenderness. No edema.  ?   Left lower leg: No tenderness. No edema.  ?Skin: ?   General: Skin is warm and dry.  ?   Capillary Refill: Capillary refill takes less than 2 seconds.  ?Neurological:  ?   General: No focal deficit present.  ?   Mental Status: She is alert.  ? ? ?ED Results / Procedures / Treatments   ?Labs ?(all labs ordered are listed, but only abnormal results are displayed) ?Labs Reviewed  ?BASIC METABOLIC PANEL - Abnormal; Notable for the following components:  ?    Result Value  ? Sodium 133 (*)   ? Glucose, Bld 112 (*)   ? BUN 28 (*)   ? Creatinine, Ser 1.05 (*)   ? GFR, Estimated 51 (*)   ? All other components within normal limits  ?CBC - Abnormal; Notable for the following components:  ? WBC 11.9 (*)   ? Platelets 537 (*)   ? All other components within normal limits  ?TROPONIN I (HIGH SENSITIVITY)  ?TROPONIN I (HIGH SENSITIVITY)  ? ? ?EKG ?EKG Interpretation ? ?Date/Time:  Saturday October 18 2021 14:14:19 EDT ?Ventricular Rate:  78 ?PR Interval:  178 ?QRS Duration: 82 ?QT Interval:  386 ?QTC  Calculation: 440 ?R Axis:   75 ?Text Interpretation: Normal sinus rhythm Nonspecific ST abnormality Abnormal ECG When compared with ECG of 26-Apr-2021 17:45, No significant change since last tracing Confirmed by Aletta Edouard [phone]) on 10/18/2021 3:28:59 PM ? ?Radiology ?DG Chest 2 View ? ?Result Date: 10/18/2021 ?CLINICAL DATA:  Chest pain EXAM: CHEST - 2 VIEW COMPARISON:  Chest x-rays dated 04/26/2021 and 03/04/2020. FINDINGS: Heart size and mediastinal contours are stable. Coarse lung  markings are again seen bilaterally, stable. No confluent opacity to suggest a superimposed pneumonia. No pleural effusion or pneumothorax is seen. No acute-appearing osseous abnormality. IMPRESSION: 1. No active cardiopulmonary disease. No evidence of pneumonia or pulmonary edema. 2. Stable chronic interstitial lung disease/fibrosis and probable chronic bronchitic change. Electronically Signed   By: Franki Cabot M.D.   On: 10/18/2021 14:38  ? ?CT Angio Chest PE W/Cm &/Or Wo Cm ? ?Result Date: 10/18/2021 ?CLINICAL DATA:  Left-sided chest pain, PE suspected EXAM: CT ANGIOGRAPHY CHEST WITH CONTRAST TECHNIQUE: Multidetector CT imaging of the chest was performed using the standard protocol during bolus administration of intravenous contrast. Multiplanar CT image reconstructions and MIPs were obtained to evaluate the vascular anatomy. RADIATION DOSE REDUCTION: This exam was performed according to the departmental dose-optimization program which includes automated exposure control, adjustment of the mA and/or kV according to patient size and/or use of iterative reconstruction technique. CONTRAST:  49m OMNIPAQUE IOHEXOL 350 MG/ML SOLN COMPARISON:  PET-CT, 10/07/2020 FINDINGS: Cardiovascular: Satisfactory opacification of the pulmonary arteries to the segmental level. No evidence of pulmonary embolism. Mild cardiomegaly. Scattered left coronary artery calcifications. No pericardial effusion. Aortic atherosclerosis. Mediastinum/Nodes: Similar  appearance of enlarged left axillary and subpectoral lymph nodes, measuring up to 1.7 x 1.3 cm (series 4, image 29). No other enlarged mediastinal, hilar, or axillary lymph nodes. Thyroid gland, trachea, and esophagus de

## 2021-10-18 NOTE — ED Notes (Signed)
Patient discharged via [facility] with family. No acute distress noted upon this RN's departure of Patient.  ?

## 2021-10-18 NOTE — ED Triage Notes (Addendum)
Got a proceptin injection (cancer treatment) on Monday, since then c/o pain. At 0300 on Thursday morning started with severe left sided chest pain. States pain has improved today after taking motrin '600mg'$ . ?

## 2021-10-20 ENCOUNTER — Telehealth: Payer: Self-pay

## 2021-10-20 ENCOUNTER — Other Ambulatory Visit: Payer: Self-pay | Admitting: Hematology and Oncology

## 2021-10-20 DIAGNOSIS — Z79899 Other long term (current) drug therapy: Secondary | ICD-10-CM

## 2021-10-20 DIAGNOSIS — C50212 Malignant neoplasm of upper-inner quadrant of left female breast: Secondary | ICD-10-CM

## 2021-10-20 MED ORDER — ACETAMINOPHEN-CODEINE #3 300-30 MG PO TABS: 1.0000 | ORAL_TABLET | Freq: Four times a day (QID) | ORAL | 0 refills | Status: DC | PRN

## 2021-10-20 NOTE — Telephone Encounter (Signed)
I let patient daughter know about Tylenol 3. ? ?

## 2021-10-20 NOTE — Telephone Encounter (Signed)
-----   Message from Melodye Ped, NP sent at 10/20/2021  3:51 PM EDT ----- ?Ok, I will send in the Tylenol 3 and order her ECHO. ?----- Message ----- ?From: Georgette Shell, RN ?Sent: 10/20/2021   3:51 PM EDT ?To: Melodye Ped, NP ? ?Not infusion, herceptin injection. Per Malachy Mood the sight was up more on her hip then before.  She does have tramadol but pt does not like to take it, she requested Tylenol 3?  He Echo is due this month, and Malachy Mood is wanting to get this done since she had chest pain on Saturday.  [facility] ED did rule out pleura infusion, MI and blood clot.   ? ? ?----- Message ----- ?From: Georgette Shell, RN ?Sent: 10/20/2021  11:13 AM EDT ?To: Melodye Ped, NP ? ?Malachy Mood, patient daughter, called to let us know that patient c/o that her infusion last Monday was very painful, which is uncommon.  On Thursday patient had chest pain and they went to [facility] ED.  Patient is also complaining of left axillary pain which per daughter is where the cancer has spread to lymph nodes.   ? ? ? ?

## 2021-10-24 ENCOUNTER — Encounter: Payer: Self-pay | Admitting: Oncology

## 2021-10-28 NOTE — Progress Notes (Signed)
?[facility]  ?[address] ?[place],  [place]  [address] ?[phone] ? ?Clinic Day:  10/29/21 ? ?Referring physician: Raina Mina., MD ? ? ?ASSESSMENT & PLAN:  ? ?1.  Stage IA triple positive left breast cancer, diagnosed in May 2013.  She was treated with left mastectomy in June.  She was started on tamoxifen 20 mg daily in July 2013, but this was discontinued in July 2017 due to concerns regarding endometrial hyperplasia.  She completed one year of therapy with trastuzumab in September 2014.   ?  ?2.  Recurrent breast cancer, February 2022, with evidence of left axillary and regional lymph nodes as well as bone lesions.  She was subsequently placed on anastrozole which she started on Febuary 28th.  Trastuzumab every 3 weeks was recommended and started on March 18th 2022. Anastrozole was discontinued due to possible mental status toxicities, which in retrospect were more likely from the Mybetriq. She had been switched to fulvestrant injections since September 1st. ?  ?3.  Significant elevation of the CA 27-29 which fluctuates, but was over 300 by October and 403 by November. It started to come down, from 368 to 344 in early January. However, by late January, it had significantly increased to 706.2.  The last reading was up to 782.8 and repeat went down slightly to 772.3.  However, it has now increased to 889 and it is time to change treatment.  We have discussed other therapeutic approaches and will switch to oral letrozole instead of the fulvestrant injections. If that is ineffective, another option would be to add in a low dose of palbociclib and I discussed the potential toxicities.  We could also target the HER2 with additional medication with agents such as pertuzumab.  They are not interested in pursuing aggressive IV chemotherapy. ? ?4.  Lower extremity weakness and episodes of confusion, being evaluated by neurology and felt to be neuropathy.  She is currently on  Mestinon 0.5 mg TID. She declines re-evaluation by physical therapy at this time. ?  ?5.  Osteoporosis of the forearm.  She received denosumab on March 3rd, but did not tolerate this therapy due to jaw pain, weakness and fatigue, and so it was discontinued.  We decided to place her on alendronate 70 mg weekly and she is tolerating this well.  She is no longer on oral calcium. ?  ?6.  Status post splenectomy.  She has started vaccines and had a severe reaction with the meningitis vaccines.  We will not pursue further meningitis vaccines. ? ?7.  Hyponatremia.  This has been chronic and generally stable.  I advised that she include fluids such as Gatorade or Pedialyte.  ? ?8.  Thrombocytosis.  I feel this is reactive and it is improved today at 409,000.  We will continue to monitor. ? ?Her CA 27.29 from late January significantly increased to 706.2 and was 782.8 in early March and went down to 772.3.  I did not want to change her therapy regimen based on this since she was clinically stable.  We have repeated this today, and it is now up to 889, so we will consider switching her therapy.  I think our best choice would be oral letrozole and/or palbociclib as well as pertuzumab. For now, she will proceed with a 19th cycle of trastuzumab this week.  I will stop the that fulvestrant. She really shows no increase in symptoms relating to her bone metastases. She knows to continue alendronate 70  mg weekly. Otherwise, we will plan to see her back in 3 weeks with CBC, CMP and CA 27.29 prior to her 21st cycle of trastuzumab.  Her last echocardiogram was August 22, 2021 and I suggested that she have the next one at [facility] going forward.  Both she and her daughter verbalize understanding of and agreement to the plans discussed today. They know to call the office should any new questions or concerns arise.  ? ?I provided 20 minutes of face-to-face time during this this encounter and > 50% was spent counseling as  documented under my assessment and plan.  ? ? ?[name], MD ?[facility] ?[place] ?[place] [address] ?Dept: [phone] ?Dept Fax: [phone]  ? ? ?CHIEF COMPLAINT:  ?CC: History of recurrent breast cancer ? ?Current Treatment:  Trastuzumab every 3 weeks, alendronate and fulvestrant injections monthly ? ? ?HISTORY OF PRESENT ILLNESS:  ?Amy Kline is a 88 y.o. female who presented as a transfer of care due to recurrent breast cancer. She was originally diagnosed with a stage IA left breast cancer in May 2013 when biopsy confirmed invasive ductal carcinoma, grade 2.  Estrogen and progesterone receptors were positive at 100% and 96%, and HER2 was negative.  She was treated with left mastectomy in June and repeat HER2 was positive by [facility].  She followed with Dr. Lurline Del, and was started on tamoxifen 20 mg daily in July 2013, but this was discontinued in July 2017 due to concerns regarding endometrial hyperplasia.  She completed one year of therapy with trastuzumab in September 2014.  She did have some cardiotoxicity with this therapy, and so this had to be held this for 5-6 weeks. ?  ?However, in the summer of 2021 she started having trouble walking with generalized lower extremity weakness and lower back pain.  She also was having episodes of syncope.  MRI brain from August was negative, but MRI lumbar spine from December revealed STIR hyperintense foci at T12-L1 and S1.  MRI pelvis showed similar areas, and in addition, numerous enhancing marrow replacing lesions in the pelvis.  Bone marrow biopsy from January 2022 showed no evidence of carcinoma and a normocellular marrow.  CT chest, abdomen and pelvis showed increased left axillary and subpectoral lymphadenopathy but no other evidence of metastatic disease.  She underwent left axillary lymph node biopsy on February 18th which confirmed triple positive carcinoma  involving fatty tissue.  She was subsequently placed on anastrozole which she started on Febuary 28th, and denosumab was initiated on March 3rd, but she did not tolerate this, so denosumab was stopped.  Trastuzumab was recommended, and initiated on March 18th.  ECHO from September 2021 revealed an ejection fraction between 60-65%.  Tumor markers from January revealed a CA 27-29 of 87, and CEA of 6.65.  Recent CA 27-29 from March is significantly elevated at 221, and CEA was 10.12.  Staging PET imaging from March confirmed metastatic breast cancer as evidenced by hypermetabolic left subpectoral/left axillary lymph nodes and widespread hypermetabolic osseous lesions.  There is mild hypermetabolism within an nonenlarged subcarinal lymph node and both hilar regions.  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  She is status post splenectomy, and has had both Prevnar 13 and pneumovax within the last few years. Bone density scan from March revealed osteoporosis with a T-score of -3.3 of the forearm, and right femur neck  measures -2.0, considered osteopenic.  She did have her meningitis vaccine and experienced severe soreness which is resolving. ECHO from June 30th revealed an EF between 55-60%.  She was placed on alendronate 70 mg weekly.  We stopped the anastrozole and switched her to fulvestrant injections with the 1st one administered September 2nd. New baseline CA 27.29 from September was 299.9, up from 261, and has steadily worsened. She had some worsening mental status changes which seemed to improve when the Vesicare was stopped. There may have been other factors involved such as mild hypercalcemia (up over 11.0), hormonal therapy, and her previous reaction to the meningitis vaccine.  ?INTERVAL HISTORY:  ?I have reviewed her chart and materials related to her cancer extensively and collaborated history with the patient. Summary of oncologic history is as follows: ?Oncology  History  ?Breast cancer of upper-inner quadrant of left female breast ([place])  ?12/02/2011 Cancer Staging  ? Staging form: Breast, AJCC 7th Edition ?- Pathologic: Stage IV (TX, NX, M1) - Signed by Chauncey Cruel

## 2021-10-29 ENCOUNTER — Inpatient Hospital Stay (INDEPENDENT_AMBULATORY_CARE_PROVIDER_SITE_OTHER): Payer: Medicare Other | Admitting: Oncology

## 2021-10-29 ENCOUNTER — Inpatient Hospital Stay: Payer: Medicare Other

## 2021-10-29 ENCOUNTER — Other Ambulatory Visit: Payer: Self-pay

## 2021-10-29 ENCOUNTER — Other Ambulatory Visit: Payer: Self-pay | Admitting: Oncology

## 2021-10-29 ENCOUNTER — Encounter: Payer: Self-pay | Admitting: Oncology

## 2021-10-29 ENCOUNTER — Telehealth: Payer: Self-pay

## 2021-10-29 VITALS — BP 121/57 | HR 69 | Temp 97.6°F | Resp 18 | Ht 66.0 in

## 2021-10-29 DIAGNOSIS — N390 Urinary tract infection, site not specified: Secondary | ICD-10-CM

## 2021-10-29 DIAGNOSIS — C50212 Malignant neoplasm of upper-inner quadrant of left female breast: Secondary | ICD-10-CM

## 2021-10-29 DIAGNOSIS — Z170 Estrogen receptor positive status [ER+]: Secondary | ICD-10-CM

## 2021-10-29 DIAGNOSIS — Z5111 Encounter for antineoplastic chemotherapy: Secondary | ICD-10-CM | POA: Diagnosis not present

## 2021-10-29 DIAGNOSIS — C7951 Secondary malignant neoplasm of bone: Secondary | ICD-10-CM

## 2021-10-29 LAB — URINALYSIS, COMPLETE (UACMP) WITH MICROSCOPIC
Bilirubin Urine: NEGATIVE
Glucose, UA: NEGATIVE mg/dL
Hgb urine dipstick: NEGATIVE
Ketones, ur: NEGATIVE mg/dL
Nitrite: NEGATIVE
Protein, ur: NEGATIVE mg/dL
Specific Gravity, Urine: 1.018 (ref 1.005–1.030)
pH: 5 (ref 5.0–8.0)

## 2021-10-29 LAB — BASIC METABOLIC PANEL
BUN: 21 (ref 4–21)
CO2: 28 — AB (ref 13–22)
Chloride: 98 — AB (ref 99–108)
Creatinine: 0.7 (ref 0.5–1.1)
Glucose: 119
Potassium: 4 mEq/L (ref 3.5–5.1)
Sodium: 134 — AB (ref 137–147)

## 2021-10-29 LAB — CBC AND DIFFERENTIAL
HCT: 39 (ref 36–46)
Hemoglobin: 12.9 (ref 12.0–16.0)
Neutrophils Absolute: 5.54
Platelets: 409 10*3/uL — AB (ref 150–400)
WBC: 9.9

## 2021-10-29 LAB — CBC: RBC: 4.27 (ref 3.87–5.11)

## 2021-10-29 LAB — HEPATIC FUNCTION PANEL
ALT: 22 U/L (ref 7–35)
AST: 51 — AB (ref 13–35)
Alkaline Phosphatase: 255 — AB (ref 25–125)
Bilirubin, Total: 0.6

## 2021-10-29 LAB — COMPREHENSIVE METABOLIC PANEL
Albumin: 3.8 (ref 3.5–5.0)
Calcium: 10.2 (ref 8.7–10.7)

## 2021-10-29 NOTE — Telephone Encounter (Signed)
-----   Message from [name], MD sent at 10/28/2021  2:15 PM EDT ----- ?Regarding: ECHO ?ECHO this week? Maybe thru [facility] now ? ?

## 2021-10-29 NOTE — Telephone Encounter (Signed)
Echo scheduled for 10/30/21 at Cardiology office ? ?

## 2021-10-30 ENCOUNTER — Other Ambulatory Visit: Payer: Self-pay | Admitting: Oncology

## 2021-10-30 ENCOUNTER — Telehealth: Payer: Self-pay

## 2021-10-30 ENCOUNTER — Ambulatory Visit (INDEPENDENT_AMBULATORY_CARE_PROVIDER_SITE_OTHER): Payer: Medicare Other

## 2021-10-30 DIAGNOSIS — C50212 Malignant neoplasm of upper-inner quadrant of left female breast: Secondary | ICD-10-CM

## 2021-10-30 DIAGNOSIS — Z7969 Long term (current) use of other immunomodulators and immunosuppressants: Secondary | ICD-10-CM

## 2021-10-30 DIAGNOSIS — Z0189 Encounter for other specified special examinations: Secondary | ICD-10-CM

## 2021-10-30 DIAGNOSIS — Z79899 Other long term (current) drug therapy: Secondary | ICD-10-CM | POA: Diagnosis not present

## 2021-10-30 DIAGNOSIS — N390 Urinary tract infection, site not specified: Secondary | ICD-10-CM

## 2021-10-30 LAB — ECHOCARDIOGRAM COMPLETE
Area-P 1/2: 2.6 cm2
P 1/2 time: 400 msec
S' Lateral: 2.9 cm

## 2021-10-30 LAB — CANCER ANTIGEN 27.29: CA 27.29: 889 U/mL — ABNORMAL HIGH (ref 0.0–38.6)

## 2021-10-30 MED ORDER — LETROZOLE 2.5 MG PO TABS: 2.5000 mg | ORAL_TABLET | Freq: Every day | ORAL | 3 refills | Status: DC

## 2021-10-30 MED ORDER — CIPROFLOXACIN HCL 250 MG PO TABS: 250.0000 mg | ORAL_TABLET | Freq: Two times a day (BID) | ORAL | 0 refills | Status: DC

## 2021-10-30 NOTE — Telephone Encounter (Addendum)
RE: UA results ?Received: Today ?[name], MD  [name], RN ?I called her and will send in Cipro 250 bid x 10 days.  I also discussed the CA 27.29 is up to 889.  ?Will stop the Faslodex and place her on letrozole 2.5 mg qd  ? ? ?Pt's dtr, [name], called. She saw the UA results on MyChart and mentions she understands results are not overwhelming positive. She states, "but in past she just feels better if we go ahead and treat her". Let me know if Dr [name] would send her in antibiotic. Above message sent to Dr [name] ?

## 2021-10-31 ENCOUNTER — Ambulatory Visit: Payer: Medicare Other

## 2021-10-31 ENCOUNTER — Inpatient Hospital Stay: Payer: Medicare Other

## 2021-10-31 ENCOUNTER — Other Ambulatory Visit: Payer: Self-pay | Admitting: Pharmacist

## 2021-10-31 VITALS — BP 152/63 | HR 65 | Temp 98.3°F | Resp 18 | Ht 66.0 in | Wt 165.0 lb

## 2021-10-31 DIAGNOSIS — Z5111 Encounter for antineoplastic chemotherapy: Secondary | ICD-10-CM | POA: Diagnosis not present

## 2021-10-31 DIAGNOSIS — C50212 Malignant neoplasm of upper-inner quadrant of left female breast: Secondary | ICD-10-CM

## 2021-10-31 DIAGNOSIS — C7951 Secondary malignant neoplasm of bone: Secondary | ICD-10-CM

## 2021-10-31 LAB — URINE CULTURE: Culture: >=100000 — AB

## 2021-10-31 MED ORDER — TRASTUZUMAB-HYALURONIDASE-OYSK 600-10000 MG-UNT/5ML ~~LOC~~ SOLN
600.0000 mg | Freq: Once | SUBCUTANEOUS | Status: AC
  Administered 2021-10-31: 600 mg via SUBCUTANEOUS
  Filled 2021-10-31: qty 5

## 2021-10-31 MED ORDER — LORATADINE 10 MG PO TABS: 10.0000 mg | ORAL_TABLET | Freq: Once | ORAL | Status: DC

## 2021-10-31 MED ORDER — ACETAMINOPHEN 325 MG PO TABS: 650.0000 mg | ORAL_TABLET | Freq: Once | ORAL | Status: DC

## 2021-10-31 NOTE — Progress Notes (Signed)
1450:PT STABLE AT TIME OF DISCHARGE ?

## 2021-10-31 NOTE — Patient Instructions (Signed)
[facility]  Discharge Instructions: Thank you for choosing [facility] to provide your oncology and hematology care.  If you have a lab appointment with the Cancer Center, please go directly to the Cancer Center and check in at the registration area.   Wear comfortable clothing and clothing appropriate for easy access to any Portacath or PICC line.   We strive to give you quality time with your provider. You may need to reschedule your appointment if you arrive late (15 or more minutes).  Arriving late affects you and other patients whose appointments are after yours.  Also, if you miss three or more appointments without notifying the office, you may be dismissed from the clinic at the provider's discretion.      For prescription refill requests, have your pharmacy contact our office and allow 72 hours for refills to be completed.    Today you received the following chemotherapy and/or immunotherapy agents Trastuzumab     To help prevent nausea and vomiting after your treatment, we encourage you to take your nausea medication as directed.  BELOW ARE SYMPTOMS THAT SHOULD BE REPORTED IMMEDIATELY: *FEVER GREATER THAN 100.4 F (38 C) OR HIGHER *CHILLS OR SWEATING *NAUSEA AND VOMITING THAT IS NOT CONTROLLED WITH YOUR NAUSEA MEDICATION *UNUSUAL SHORTNESS OF BREATH *UNUSUAL BRUISING OR BLEEDING *URINARY PROBLEMS (pain or burning when urinating, or frequent urination) *BOWEL PROBLEMS (unusual diarrhea, constipation, pain near the anus) TENDERNESS IN MOUTH AND THROAT WITH OR WITHOUT PRESENCE OF ULCERS (sore throat, sores in mouth, or a toothache) UNUSUAL RASH, SWELLING OR PAIN  UNUSUAL VAGINAL DISCHARGE OR ITCHING   Items with * indicate a potential emergency and should be followed up as soon as possible or go to the Emergency Department if any problems should occur.  Please show the CHEMOTHERAPY ALERT CARD or IMMUNOTHERAPY ALERT CARD at check-in to the  Emergency Department and triage nurse.  Should you have questions after your visit or need to cancel or reschedule your appointment, please contact [facility]  Dept: [phone]  and follow the prompts.  Office hours are 8:00 a.m. to 4:30 p.m. Monday - Friday. Please note that voicemails left after 4:00 p.m. may not be returned until the following business day.  We are closed weekends and major holidays. You have access to a nurse at all times for urgent questions. Please call the main number to the clinic Dept: [phone] and follow the prompts.  For any non-urgent questions, you may also contact your provider using MyChart. We now offer e-Visits for anyone 18 and older to request care online for non-urgent symptoms. For details visit [email or web address].   Also download the MyChart app! Go to the app store, search "MyChart", open the app, select [facility], and log in with your MyChart username and password.  Due to Covid, a mask is required upon entering the hospital/clinic. If you do not have a mask, one will be given to you upon arrival. For doctor visits, patients may have 1 support person aged 18 or older with them. For treatment visits, patients cannot have anyone with them due to current Covid guidelines and our immunocompromised population.    

## 2021-11-03 ENCOUNTER — Ambulatory Visit: Payer: Medicare Other | Admitting: Neurology

## 2021-11-04 ENCOUNTER — Other Ambulatory Visit: Payer: Self-pay | Admitting: Cardiology

## 2021-11-04 NOTE — Telephone Encounter (Signed)
Rx refill sent to pharmacy. 

## 2021-11-08 ENCOUNTER — Encounter: Payer: Self-pay | Admitting: Oncology

## 2021-11-18 ENCOUNTER — Other Ambulatory Visit: Payer: Self-pay | Admitting: Cardiology

## 2021-11-18 NOTE — Progress Notes (Signed)
[place]  [address] [place],  [place]  [address] [phone]  Clinic Day:  11/19/21  Referring physician: Raina Mina., MD   ASSESSMENT & PLAN:   1.  Stage IA triple positive left breast cancer, diagnosed in May 2013.  She was treated with left mastectomy in June.  She was started on tamoxifen 20 mg daily in July 2013, but this was discontinued in July 2017 due to concerns regarding endometrial hyperplasia.  She completed one year of therapy with trastuzumab in September 2014.     2.  Recurrent breast cancer, February 2022, with evidence of left axillary and regional lymph nodes as well as bone lesions.  She was subsequently placed on anastrozole which she started on Febuary 28th.  Trastuzumab every 3 weeks was recommended and started on March 18th 2022. Anastrozole was discontinued due to possible mental status toxicities, which in retrospect were more likely from the Mybetriq. She had been switched to fulvestrant injections since September.  3.  Significant elevation of the CA 27-29 which fluctuates, but was over 300 by October and 403 by November. It started to come down, from 368 to 344 in early January. However, by late January, it had significantly increased to 706.2.  The last reading was up to 782.8 and repeat went down slightly to 772.3.  However, it has now increased to 889 and we switched to oral letrozole on May 1, instead of the fulvestrant injections. If that is ineffective, another option would be to add in a low dose of palbociclib and I discussed the potential toxicities.  We could also target the HER2 with additional medication with agents such as pertuzumab.  They are not interested in pursuing aggressive IV chemotherapy.  4.  Lower extremity weakness and episodes of confusion, being evaluated by neurology and felt to be neuropathy.  She is currently on Mestinon 0.5 mg TID. She declines re-evaluation by physical therapy at this time.    5.  Osteoporosis of the forearm.  She received denosumab on March 3rd, but did not tolerate this therapy due to jaw pain, weakness and fatigue, and so it was discontinued.  We decided to place her on alendronate 70 mg weekly and she is tolerating this well.  She is no longer on oral calcium.   6.  Status post splenectomy.  She has started vaccines and had a severe reaction with the meningitis vaccines.  We will not pursue further meningitis vaccines.  7.  Hyponatremia.  This has been chronic and generally stable.  I advised that she include fluids such as Gatorade or Pedialyte.   8.  Thrombocytosis.  I feel this is reactive and it is worse today at 493,000.  We will continue to monitor.  Her CA 27.29 from late January significantly increased to 706.2 and was 782.8 in early March and went down to 772.3.  However the most recent 1 is now up to 889, so we switched her therapy to oral letrozole as of May 1. For now, she will proceed with a 21st cycle of trastuzumab this week.  She complains of increase in symptoms relating to her bone metastases, and complains of extreme weakness.  I think a lot of her leg pain is more with the and the patient has Tylenol 3.  We will check her urine since she has had recurrent urine infections.  Dr. Toma Deiters has prescribed a low-dose of Macrodantin as a very suppressive antibiotic and I explained that this is  an excellent idea and encouraged them to start it.  She knows to continue alendronate 70 mg weekly. Otherwise, we will plan to see her back in 3 weeks with CBC, CMP and CA 27.29 prior to her 22nd cycle of trastuzumab.  Her last echocardiogram was August 22, 2021 and I suggested that she have the next one at [facility] going forward.  Both she and her daughter verbalize understanding of and agreement to the plans discussed today. They know to call the office should any new questions or concerns arise.   I provided 20 minutes of face-to-face time during this this  encounter and > 50% was spent counseling as documented under my assessment and plan.    [name], MD [place] [address] [place] [address] Dept: [phone] Dept Fax: [phone]    CHIEF COMPLAINT:  CC: History of recurrent breast cancer  Current Treatment:  Trastuzumab every 3 weeks, alendronate and letrozole   HISTORY OF PRESENT ILLNESS:  Amy Kline is a 88 y.o. female who presented as a transfer of care due to recurrent breast cancer. She was originally diagnosed with a stage IA left breast cancer in May 2013 when biopsy confirmed invasive ductal carcinoma, grade 2.  Estrogen and progesterone receptors were positive at 100% and 96%, and HER2 was negative.  She was treated with left mastectomy in June and repeat HER2 was positive by [facility].  She followed with Dr. Lurline Del, and was started on tamoxifen 20 mg daily in July 2013, but this was discontinued in July 2017 due to concerns regarding endometrial hyperplasia.  She completed one year of therapy with trastuzumab in September 2014.  She did have some cardiotoxicity with this therapy, and so this had to be held this for 5-6 weeks.   However, in the summer of 2021 she started having trouble walking with generalized lower extremity weakness and lower back pain.  She also was having episodes of syncope.  MRI brain from August was negative, but MRI lumbar spine from December revealed STIR hyperintense foci at T12-L1 and S1.  MRI pelvis showed similar areas, and in addition, numerous enhancing marrow replacing lesions in the pelvis.  Bone marrow biopsy from January 2022 showed no evidence of carcinoma and a normocellular marrow.  CT chest, abdomen and pelvis showed increased left axillary and subpectoral lymphadenopathy but no other evidence of metastatic disease.  She underwent left axillary lymph node biopsy on February 18th which confirmed  triple positive carcinoma involving fatty tissue.  She was subsequently placed on anastrozole which she started on Febuary 28th, and denosumab was initiated on March 3rd, but she did not tolerate this, so denosumab was stopped.  Trastuzumab was recommended, and initiated on March 18th.  ECHO from September 2021 revealed an ejection fraction between 60-65%.  Tumor markers from January revealed a CA 27-29 of 87, and CEA of 6.65.  Recent CA 27-29 from March is significantly elevated at 221, and CEA was 10.12.  Staging PET imaging from March confirmed metastatic breast cancer as evidenced by hypermetabolic left subpectoral/left axillary lymph nodes and widespread hypermetabolic osseous lesions.  There is mild hypermetabolism within an nonenlarged subcarinal lymph node and both hilar regions.  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  She is status post splenectomy, and has had both Prevnar 13 and pneumovax within the last few years. Bone density scan from March  revealed osteoporosis with a T-score of -3.3 of the forearm, and right femur neck measures -2.0, considered osteopenic.  She did have her meningitis vaccine and experienced severe soreness which is resolving. ECHO from June 30th revealed an EF between 55-60%.  She was placed on alendronate 70 mg weekly.  We stopped the anastrozole and switched her to fulvestrant injections with the 1st one administered September 2nd. New baseline CA 27.29 from September was 299.9, up from 261, and has steadily worsened. She had some worsening mental status changes which seemed to improve when the Vesicare was stopped. There may have been other factors involved such as mild hypercalcemia (up over 11.0), hormonal therapy, and her previous reaction to the meningitis vaccine.  INTERVAL HISTORY:  I have reviewed her chart and materials related to her cancer extensively and collaborated history with the patient. Summary of oncologic  history is as follows: Oncology History  Breast cancer of upper-inner quadrant of left female breast ([place])  12/02/2011 Cancer Staging   Staging form: Breast, AJCC 7th Edition - Pathologic: Stage IV (TX, NX, M1) - Signed by Chauncey Cruel, MD on 09/02/2020 Specimen type: Core Needle Biopsy Histopathologic type: Infiltrating duct carcinoma, NOS Laterality: Left    05/30/2013 Initial Diagnosis   Breast cancer of upper-inner quadrant of left female breast ([place])    09/27/2020 -  Chemotherapy   Patient is on Treatment Plan : BREAST Trastuzumab SubQ q21d      Metastasis to bone ([place])  07/03/2020 Initial Diagnosis   Metastasis to bone ([place])    09/27/2020 -  Chemotherapy   Patient is on Treatment Plan : BREAST Trastuzumab SubQ q21d       Houda is here for routine follow up prior to a 21st cycle of trastuzumab. She states that she is doing fair but has had more weakness this week.  Her last CTA in April was negative for pulmonary embolism and just showed mild pulmonary fibrosis and similar appearance of the enlarged metastatic left axillary and subpectoral lymph nodes, as well as the sclerotic osseous metastatic lesions.  She is using ibuprofen 600 mg as needed for pain, and occasional Tylenol 3.  She does have pain and paresthesias of the lower extremities, consistent with neuropathy, which occasionally wakes her up during the night. Platelets have increased again to 493,000 and white count and hemoglobin are normal. Chemistries are unremarkable except for a BUN of 20, and an alkaline phosphatase of 293 and her SGOT is mildly elevated at 80.  Her sodium remains mildly low at 134.  We have stopped the fulvestrant injections.  We switched her to oral letrozole since her cancer marker continues to rise dramatically. Her  appetite is poor, and her weight has decreased 3 pounds since her last visit.  Her last echocardiogram was on February 10 and she will have the next one at [facility] tomorrow.   She denies fever, chills or other signs of infection.  She denies nausea, vomiting, bowel issues, or abdominal pain.  She denies sore throat, cough, dyspnea, or chest pain.  She does complain of extreme weakness.  They request that we recheck a urine since she has had so many recurrent urinary tract infections.  I will therefore get a urinalysis and urine culture if she is able to give [place] a specimen.  HISTORY:   Allergies:  Allergies  Allergen Reactions   Neosporin [Neomycin-Polymyxin-Gramicidin] Rash   Sulfa Antibiotics Hives    itching   Bacitracin-Polymyxin B    Bexsero [Meningococcal  Grp B (Recomb) Vaccine] Other (See Comments)    Pt reported severe arthralgias and fatigue after 1st dose of bexsero and wishes to not receive again.   Cefdinir Nausea And Vomiting   Denosumab     Pain in jaw/head/mouth numb/weak   Other Other (See Comments)    Polysporin causes a rash and swelling Unknown   Statins     Current Medications: Current Outpatient Medications  Medication Sig Dispense Refill   acetaminophen-codeine (TYLENOL #3) 300-30 MG tablet Take 1 tablet by mouth every 6 (six) hours as needed for moderate pain. 90 tablet 0   alendronate (FOSAMAX) 70 MG tablet TAKE 1 TABLET BY MOUTH  WEEKLY WITH 8 OZ OF PLAIN  WATER 30 MINUTES BEFORE  FIRST FOOD, DRINK OR MEDS.  STAY UPRIGHT FOR 30 MINS 12 tablet 3   amLODipine (NORVASC) 2.5 MG tablet TAKE 1 TABLET BY MOUTH DAILY 90 tablet 3   aspirin 81 MG tablet Take 81 mg by mouth daily.     carvedilol (COREG) 12.5 MG tablet Take 1 tablet (12.5 mg total) by mouth 2 (two) times daily with a meal. 180 tablet 3   cyanocobalamin 1000 MCG tablet Take by mouth.     ergocalciferol (VITAMIN D2) 1.25 MG (50000 UT) capsule Take 50,000 Units by mouth once a week.     furosemide (LASIX) 20 MG tablet Take 20 mg by mouth daily.     ibuprofen (ADVIL) 600 MG tablet Take 600 mg by mouth as needed for headache.     lactose free nutrition (BOOST) LIQD Take 237 mLs by  mouth as needed (other). for no appetite     lamoTRIgine (LAMICTAL) 25 MG tablet Take 1 tablet (25 mg total) by mouth 2 (two) times daily. 60 tablet 5   letrozole (FEMARA) 2.5 MG tablet Take 1 tablet (2.5 mg total) by mouth daily. 90 tablet 3   nitrofurantoin (MACRODANTIN) 50 MG capsule Take 50 mg by mouth daily.     omeprazole (PRILOSEC) 20 MG capsule Take 20 mg by mouth at bedtime.      pyridostigmine (MESTINON) 60 MG tablet Take by mouth.     SAVELLA 50 MG TABS tablet Take 50 mg by mouth 2 (two) times daily.     telmisartan-hydrochlorothiazide (MICARDIS HCT) 40-12.5 MG tablet Take 0.5 tablets by mouth daily. 45 tablet 1   traMADol (ULTRAM) 50 MG tablet Take 1 tablet (50 mg total) by mouth every 6 (six) hours as needed. 60 tablet 0   No current facility-administered medications for this visit.    REVIEW OF SYSTEMS:  Review of Systems  Constitutional:  Negative for appetite change, chills, fatigue, fever and unexpected weight change.  HENT:  Negative.    Eyes: Negative.   Respiratory: Negative.  Negative for chest tightness, cough, hemoptysis, shortness of breath and wheezing.   Cardiovascular: Negative.  Negative for chest pain, leg swelling and palpitations.  Gastrointestinal:  Negative for abdominal distention, abdominal pain, blood in stool, constipation, diarrhea, nausea and vomiting.  Endocrine: Negative.   Genitourinary:  Negative for difficulty urinating, dysuria, frequency and hematuria.   Musculoskeletal:  Positive for back pain (of the lower back, chronic) and gait problem (in a wheelchair). Negative for arthralgias, flank pain and myalgias.  Skin: Negative.   Neurological:  Positive for extremity weakness, gait problem (in a wheelchair) and numbness (neuropathy/paresthesias of the lower extremities). Negative for dizziness, headaches, light-headedness, seizures and speech difficulty.  Hematological: Negative.   Psychiatric/Behavioral: Negative.  Negative for depression and  sleep  disturbance. The patient is not nervous/anxious.      VITALS:  Blood pressure (!) 119/59, pulse 73, temperature 98.3 F (36.8 C), temperature source Oral, resp. rate 18, height $RemoveBe'5\' 6"'bDVoFDEaa$  (1.676 m), weight 162 lb 1.6 oz (73.5 kg), SpO2 95 %.  Wt Readings from Last 3 Encounters:  11/19/21 162 lb 1.6 oz (73.5 kg)  10/31/21 165 lb (74.8 kg)  10/18/21 163 lb (73.9 kg)    Body mass index is 26.16 kg/m.  Performance status (ECOG): 1 - Symptomatic but completely ambulatory  PHYSICAL EXAM:  Physical Exam Constitutional:      General: She is not in acute distress.    Appearance: Normal appearance. She is normal weight.  HENT:     Head: Normocephalic and atraumatic.  Eyes:     General: No scleral icterus.    Extraocular Movements: Extraocular movements intact.     Conjunctiva/sclera: Conjunctivae normal.     Pupils: Pupils are equal, round, and reactive to light.  Cardiovascular:     Rate and Rhythm: Normal rate and regular rhythm.     Pulses: Normal pulses.     Heart sounds: Normal heart sounds. No murmur heard.   No friction rub. No gallop.  Pulmonary:     Effort: Pulmonary effort is normal. No respiratory distress.     Breath sounds: Normal breath sounds.  Abdominal:     General: Bowel sounds are normal. There is no distension.     Palpations: Abdomen is soft. There is no hepatomegaly, splenomegaly or mass.     Tenderness: There is no abdominal tenderness.  Musculoskeletal:        General: Normal range of motion.     Cervical back: Normal range of motion and neck supple.     Right lower leg: No edema.     Left lower leg: No edema.  Lymphadenopathy:     Cervical: No cervical adenopathy.  Skin:    General: Skin is warm and dry.  Neurological:     General: No focal deficit present.     Mental Status: She is alert and oriented to person, place, and time. Mental status is at baseline.  Psychiatric:        Mood and Affect: Mood normal.        Behavior: Behavior normal.         Thought Content: Thought content normal.        Judgment: Judgment normal.     LABS:      Latest Ref Rng & Units 11/19/2021   12:00 AM 10/29/2021   12:00 AM 10/18/2021    2:33 PM  CBC  WBC  9.4      9.9      11.9    Hemoglobin 12.0 - 16.0 12.7      12.9      13.9    Hematocrit 36 - 46 38      39      40.5    Platelets 150 - 400 K/uL 493      409      537       This result is from an external source.      Latest Ref Rng & Units 11/19/2021   12:00 AM 10/29/2021   12:00 AM 10/18/2021    2:33 PM  CMP  Glucose 70 - 99 mg/dL   112    BUN 4 - $R'21 20      21      28    'Lv$ Creatinine  0.5 - 1.1 0.8      0.7      1.05    Sodium 137 - 147 134      134      133    Potassium 3.5 - 5.1 mEq/L 4.3      4.0      3.7    Chloride 99 - 108 98      98      99    CO2 13 - $Re'22 26      28      25    'sUg$ Calcium 8.7 - 10.7 9.8      10.2      9.9    Alkaline Phos 25 - 125 293      255        AST 13 - 35 80      51        ALT 7 - 35 U/L 27      22           This result is from an external source.     Lab Results  Component Value Date   CEA1 8.3 (H) 02/19/2021   /  CEA  Date Value Ref Range Status  02/19/2021 8.3 (H) 0.0 - 4.7 ng/mL Final    Comment:    (NOTE)                             Nonsmokers          <3.9                             Smokers             <5.6 Roche [employer]ostics Electrochemiluminescence Immunoassay (ECLIA) Values obtained with different assay methods or kits cannot be used interchangeably.  Results cannot be interpreted as absolute evidence of the presence or absence of malignant disease. Performed At: [facility] [place], [place] [id number] Rush Farmer MD UR:[id number]     Lab Results  Component Value Date   GBT517 23.7 07/22/2020      STUDIES:  No results found.

## 2021-11-19 ENCOUNTER — Encounter: Payer: Self-pay | Admitting: Oncology

## 2021-11-19 ENCOUNTER — Inpatient Hospital Stay (INDEPENDENT_AMBULATORY_CARE_PROVIDER_SITE_OTHER): Payer: Medicare Other | Admitting: Oncology

## 2021-11-19 ENCOUNTER — Inpatient Hospital Stay: Payer: Medicare Other | Attending: Oncology

## 2021-11-19 ENCOUNTER — Other Ambulatory Visit: Payer: Self-pay | Admitting: Oncology

## 2021-11-19 VITALS — BP 119/59 | HR 73 | Temp 98.3°F | Resp 18 | Ht 66.0 in | Wt 162.1 lb

## 2021-11-19 DIAGNOSIS — R630 Anorexia: Secondary | ICD-10-CM | POA: Diagnosis not present

## 2021-11-19 DIAGNOSIS — R531 Weakness: Secondary | ICD-10-CM | POA: Diagnosis not present

## 2021-11-19 DIAGNOSIS — G893 Neoplasm related pain (acute) (chronic): Secondary | ICD-10-CM | POA: Diagnosis not present

## 2021-11-19 DIAGNOSIS — G620 Drug-induced polyneuropathy: Secondary | ICD-10-CM | POA: Diagnosis not present

## 2021-11-19 DIAGNOSIS — C773 Secondary and unspecified malignant neoplasm of axilla and upper limb lymph nodes: Secondary | ICD-10-CM | POA: Insufficient documentation

## 2021-11-19 DIAGNOSIS — Z79811 Long term (current) use of aromatase inhibitors: Secondary | ICD-10-CM | POA: Diagnosis not present

## 2021-11-19 DIAGNOSIS — D75839 Thrombocytosis, unspecified: Secondary | ICD-10-CM | POA: Diagnosis not present

## 2021-11-19 DIAGNOSIS — Z5111 Encounter for antineoplastic chemotherapy: Secondary | ICD-10-CM | POA: Diagnosis present

## 2021-11-19 DIAGNOSIS — C7951 Secondary malignant neoplasm of bone: Secondary | ICD-10-CM | POA: Diagnosis not present

## 2021-11-19 DIAGNOSIS — E871 Hypo-osmolality and hyponatremia: Secondary | ICD-10-CM | POA: Diagnosis not present

## 2021-11-19 DIAGNOSIS — Z7983 Long term (current) use of bisphosphonates: Secondary | ICD-10-CM | POA: Diagnosis not present

## 2021-11-19 DIAGNOSIS — M810 Age-related osteoporosis without current pathological fracture: Secondary | ICD-10-CM | POA: Insufficient documentation

## 2021-11-19 DIAGNOSIS — Z170 Estrogen receptor positive status [ER+]: Secondary | ICD-10-CM

## 2021-11-19 DIAGNOSIS — R634 Abnormal weight loss: Secondary | ICD-10-CM | POA: Insufficient documentation

## 2021-11-19 DIAGNOSIS — C50212 Malignant neoplasm of upper-inner quadrant of left female breast: Secondary | ICD-10-CM

## 2021-11-19 DIAGNOSIS — N390 Urinary tract infection, site not specified: Secondary | ICD-10-CM

## 2021-11-19 LAB — HEPATIC FUNCTION PANEL
ALT: 27 U/L (ref 7–35)
AST: 80 — AB (ref 13–35)
Alkaline Phosphatase: 293 — AB (ref 25–125)
Bilirubin, Total: 0.7

## 2021-11-19 LAB — CBC AND DIFFERENTIAL
HCT: 38 (ref 36–46)
Hemoglobin: 12.7 (ref 12.0–16.0)
Neutrophils Absolute: 5.45
Platelets: 493 10*3/uL — AB (ref 150–400)
WBC: 9.4

## 2021-11-19 LAB — BASIC METABOLIC PANEL
BUN: 20 (ref 4–21)
CO2: 26 — AB (ref 13–22)
Chloride: 98 — AB (ref 99–108)
Creatinine: 0.8 (ref 0.5–1.1)
Glucose: 133
Potassium: 4.3 mEq/L (ref 3.5–5.1)
Sodium: 134 — AB (ref 137–147)

## 2021-11-19 LAB — COMPREHENSIVE METABOLIC PANEL
Albumin: 3.9 (ref 3.5–5.0)
Calcium: 9.8 (ref 8.7–10.7)

## 2021-11-19 LAB — CBC: RBC: 4.27 (ref 3.87–5.11)

## 2021-11-20 LAB — CANCER ANTIGEN 27.29: CA 27.29: 923.8 U/mL — ABNORMAL HIGH (ref 0.0–38.6)

## 2021-11-20 MED FILL — Trastuzumab-Hyaluronidase-oysk Inj 600-10000 MG-Unit/5ML: SUBCUTANEOUS | Qty: 5 | Status: AC

## 2021-11-21 ENCOUNTER — Inpatient Hospital Stay: Payer: Medicare Other

## 2021-11-25 ENCOUNTER — Telehealth: Payer: Self-pay

## 2021-11-25 NOTE — Telephone Encounter (Signed)
-----   Message from [name], MD sent at 11/25/2021  9:59 AM EDT ----- ?Regarding: UA, C&S ?Pls look for UA, C&S from [facility], may have been sent to [facility]? ? ?

## 2021-11-25 NOTE — Telephone Encounter (Signed)
Per Frances Furbish, urine was not collected in the office and patient's daughter has not brought a urine into the clinic. No UA, C&S was done ?

## 2021-11-26 ENCOUNTER — Telehealth: Payer: Self-pay | Admitting: Dietician

## 2021-11-26 NOTE — Telephone Encounter (Signed)
Patient's daughter [name] called to return the voice mail that I left.  When I told her that I couldn't relay any information with out patient consent she responded "How about I talk and you don't have to provide any info?" ?She relayed patient lives with spouse, spouse does most of the cooking but she and her sister Butch Penny bring food over several times a week. There is a caregiver that come 5 hours 3 days a week and she is always trying to encourage intake as well.  [name] states that since patient had Covid she has lost taste and only wants to eat little bits of food. She also reports she doesn't think patient checks emails. She does pick up the phone if she hears it. Best number is the mobile#. Verified #. ? ?April Manson, RDN, LDN ?Registered Dietitian, [place] ?Part Time Remote (Usual office hours: Tuesday-Thursday) ?Remote Office: [phone]  ?Cell: [phone] ?

## 2021-11-26 NOTE — Telephone Encounter (Signed)
Patient screened on MST. First attempt to reach, home# answered by message from daughter [name].  Provided my cell# on voice mail to return call to set up a nutrition consult. ? ?April Manson, RDN, LDN ?Registered Dietitian, [place] ?Part Time Remote (Usual office hours: Tuesday-Thursday) ?Cell: [phone]   ?

## 2021-11-26 NOTE — Telephone Encounter (Signed)
Nutrition Assessment ? ?Reason for Assessment: MST screen for weight loss.  ? ? ?ASSESSMENT: Patient is 88 year old female she reports her last injection of Herceptin was painful, and lasted for almost week which did impact her intake.  Spouse does the cooking and he has requested recipes to help with ideas to make for her.  Daughters have brought her a big huge basket of snack items (trail mix, nutrition bars) to eat between meals.  She reports that she's making efforts to increase her intake since Dr. [name] "gave her a talking to."  She has no appetite and foods don't taste like anything which makes eating less enjoyable.   Spouse also said she was amazing cook which makes it hard to make meals as good as she used to make.  Usual PO is low on calcium rich foods, she does like yogurts and pudding, will drink milk occasionally, but doesn't like cheese.   ? ?PO intake: ?[place] ?Break:  Sausage biscuit, ham & egg biscuit ? Or grits & egg  ?Lunch:  1/2 sandwich ham with lettuce & tomato ?Dinner:  pork chop, chicken rice, potato, green beans ? ?Fluids:  coffee large mug, ice water,  Pepsi, Ice tea   ? ? ? ?Anthropometrics:  ? ?Height: 66" ?Weight:  ?11/19/21  162.2# ?10/18/21   163# ?09/17/21    167# ?08/21/21   169# ?UBW:  ?BMI: 26.16 ? ?INTERVENTION: Encouraged nutrient rich snacking and increasing good calcium sources. Will mail high calorie/high protein food list.   Encouraged weight maintenance. Will leave copy of cancer cookbook at [facility]. when she returns for MD appointment 12/10/21 ? ?Next Visit: At patient or provider request ? ?April Manson, RDN, LDN ?Registered Dietitian, [place] ?Part Time Remote (Usual office hours: Tuesday-Thursday) ?Cell: [phone]   ?

## 2021-11-27 ENCOUNTER — Encounter: Payer: Self-pay | Admitting: Oncology

## 2021-11-27 ENCOUNTER — Other Ambulatory Visit: Payer: Self-pay | Admitting: Pharmacist

## 2021-11-27 MED FILL — Trastuzumab-Hyaluronidase-oysk Inj 600-10000 MG-Unit/5ML: SUBCUTANEOUS | Qty: 5 | Status: AC

## 2021-11-27 NOTE — Telephone Encounter (Signed)
Tried calling patient mobile but no answer and no voice mail.

## 2021-11-28 ENCOUNTER — Inpatient Hospital Stay: Payer: Medicare Other

## 2021-11-28 VITALS — BP 134/66 | HR 74 | Temp 97.9°F | Resp 16

## 2021-11-28 DIAGNOSIS — Z5111 Encounter for antineoplastic chemotherapy: Secondary | ICD-10-CM | POA: Diagnosis not present

## 2021-11-28 DIAGNOSIS — C50212 Malignant neoplasm of upper-inner quadrant of left female breast: Secondary | ICD-10-CM

## 2021-11-28 DIAGNOSIS — C7951 Secondary malignant neoplasm of bone: Secondary | ICD-10-CM

## 2021-11-28 MED ORDER — ACETAMINOPHEN 325 MG PO TABS: 650.0000 mg | ORAL_TABLET | Freq: Once | ORAL | Status: DC

## 2021-11-28 MED ORDER — LORATADINE 10 MG PO TABS: 10.0000 mg | ORAL_TABLET | Freq: Once | ORAL | Status: DC

## 2021-11-28 MED ORDER — TRASTUZUMAB-HYALURONIDASE-OYSK 600-10000 MG-UNT/5ML ~~LOC~~ SOLN
600.0000 mg | Freq: Once | SUBCUTANEOUS | Status: AC
  Administered 2021-11-28: 600 mg via SUBCUTANEOUS
  Filled 2021-11-28: qty 5

## 2021-11-28 NOTE — Patient Instructions (Signed)
Trastuzumab; Hyaluronidase injection What is this medication? TRASTUZUMAB; HYALURONIDASE (tras TOO zoo mab / hye al ur ON i dase) is used to treat breast cancer and stomach cancer. Trastuzumab is a monoclonal antibody. Hyaluronidase is used to improve the effects of trastuzumab. This medicine may be used for other purposes; ask your health care provider or pharmacist if you have questions. COMMON BRAND NAME(S): HERCEPTIN HYLECTA What should I tell my care team before I take this medication? They need to know if you have any of these conditions: heart disease heart failure lung or breathing disease, like asthma an unusual or allergic reaction to trastuzumab, or other medications, foods, dyes, or preservatives pregnant or trying to get pregnant breast-feeding How should I use this medication? This medicine is for injection under the skin. It is given by a health care professional in a hospital or clinic setting. Talk to your pediatrician regarding the use of this medicine in children. This medicine is not approved for use in children. Overdosage: If you think you have taken too much of this medicine contact a poison control center or emergency room at once. NOTE: This medicine is only for you. Do not share this medicine with others. What if I miss a dose? It is important not to miss a dose. Call your doctor or health care professional if you are unable to keep an appointment. What may interact with this medication? This medicine may interact with the following medications: certain types of chemotherapy, such as daunorubicin, doxorubicin, epirubicin, and idarubicin This list may not describe all possible interactions. Give your health care provider a list of all the medicines, herbs, non-prescription drugs, or dietary supplements you use. Also tell them if you smoke, drink alcohol, or use illegal drugs. Some items may interact with your medicine. What should I watch for while using this  medication? Visit your doctor for checks on your progress. Report any side effects. Continue your course of treatment even though you feel ill unless your doctor tells you to stop. Call your doctor or health care professional for advice if you get a fever, chills or sore throat, or other symptoms of a cold or flu. Do not treat yourself. Try to avoid being around people who are sick. You may experience fever, chills and shaking during your first infusion. These effects are usually mild and can be treated with other medicines. Report any side effects during the infusion to your health care professional. Fever and chills usually do not happen with later infusions. Do not become pregnant while taking this medicine or for 7 months after stopping it. Women should inform their doctor if they wish to become pregnant or think they might be pregnant. Women of child-bearing potential will need to have a negative pregnancy test before starting this medicine. There is a potential for serious side effects to an unborn child. Talk to your health care professional or pharmacist for more information. Do not breast-feed an infant while taking this medicine or for 7 months after stopping it. What side effects may I notice from receiving this medication? Side effects that you should report to your doctor or health care professional as soon as possible: allergic reactions like skin rash, itching or hives, swelling of the face, lips, or tongue breathing problems chest pain or palpitations cough fever general ill feeling or flu-like symptoms signs of worsening heart failure like breathing problems; swelling in your legs and feet Side effects that usually do not require medical attention (report these to your doctor  or health care professional if they continue or are bothersome): bone pain changes in taste diarrhea joint pain nausea/vomiting unusually weak or tired weight loss This list may not describe all possible  side effects. Call your doctor for medical advice about side effects. You may report side effects to FDA at 1-800-FDA-1088. Where should I keep my medication? This drug is given in a hospital or clinic and will not be stored at home. NOTE: This sheet is a summary. It may not cover all possible information. If you have questions about this medicine, talk to your doctor, pharmacist, or health care provider.  2023 Elsevier/Gold Standard (2017-11-15 00:00:00)  

## 2021-12-01 ENCOUNTER — Ambulatory Visit: Payer: Medicare Other

## 2021-12-04 ENCOUNTER — Encounter: Payer: Self-pay | Admitting: Oncology

## 2021-12-10 ENCOUNTER — Inpatient Hospital Stay: Payer: Medicare Other

## 2021-12-10 ENCOUNTER — Inpatient Hospital Stay (INDEPENDENT_AMBULATORY_CARE_PROVIDER_SITE_OTHER): Payer: Medicare Other | Admitting: Oncology

## 2021-12-10 ENCOUNTER — Other Ambulatory Visit: Payer: Self-pay | Admitting: Oncology

## 2021-12-10 ENCOUNTER — Telehealth: Payer: Self-pay | Admitting: Oncology

## 2021-12-10 ENCOUNTER — Encounter: Payer: Self-pay | Admitting: Oncology

## 2021-12-10 ENCOUNTER — Other Ambulatory Visit: Payer: Self-pay | Admitting: Hematology and Oncology

## 2021-12-10 VITALS — BP 134/63 | HR 76 | Temp 97.5°F | Resp 18 | Ht 66.0 in | Wt 158.7 lb

## 2021-12-10 DIAGNOSIS — C7951 Secondary malignant neoplasm of bone: Secondary | ICD-10-CM | POA: Diagnosis not present

## 2021-12-10 DIAGNOSIS — N390 Urinary tract infection, site not specified: Secondary | ICD-10-CM

## 2021-12-10 DIAGNOSIS — Z170 Estrogen receptor positive status [ER+]: Secondary | ICD-10-CM | POA: Diagnosis not present

## 2021-12-10 DIAGNOSIS — C50212 Malignant neoplasm of upper-inner quadrant of left female breast: Secondary | ICD-10-CM

## 2021-12-10 DIAGNOSIS — Z5111 Encounter for antineoplastic chemotherapy: Secondary | ICD-10-CM | POA: Diagnosis not present

## 2021-12-10 LAB — URINALYSIS, COMPLETE (UACMP) WITH MICROSCOPIC
Bacteria, UA: NONE SEEN
Bilirubin Urine: NEGATIVE
Glucose, UA: NEGATIVE mg/dL
Hgb urine dipstick: NEGATIVE
Ketones, ur: NEGATIVE mg/dL
Nitrite: NEGATIVE
Protein, ur: NEGATIVE mg/dL
Specific Gravity, Urine: 1.021 (ref 1.005–1.030)
pH: 5 (ref 5.0–8.0)

## 2021-12-10 LAB — BASIC METABOLIC PANEL
BUN: 21 (ref 4–21)
CO2: 27 — AB (ref 13–22)
Chloride: 97 — AB (ref 99–108)
Creatinine: 0.7 (ref 0.5–1.1)
Glucose: 112
Potassium: 3.9 mEq/L (ref 3.5–5.1)
Sodium: 134 — AB (ref 137–147)

## 2021-12-10 LAB — CBC AND DIFFERENTIAL
HCT: 41 (ref 36–46)
Hemoglobin: 13.2 (ref 12.0–16.0)
Neutrophils Absolute: 7.87
Platelets: 538 10*3/uL — AB (ref 150–400)
WBC: 12.3

## 2021-12-10 LAB — HEPATIC FUNCTION PANEL
ALT: 21 U/L (ref 7–35)
AST: 85 — AB (ref 13–35)
Alkaline Phosphatase: 367 — AB (ref 25–125)
Bilirubin, Total: 0.7

## 2021-12-10 LAB — COMPREHENSIVE METABOLIC PANEL
Albumin: 4.2 (ref 3.5–5.0)
Calcium: 10.7 (ref 8.7–10.7)

## 2021-12-10 LAB — CBC: RBC: 4.54 (ref 3.87–5.11)

## 2021-12-10 MED ORDER — CIPROFLOXACIN HCL 250 MG PO TABS: 250.0000 mg | ORAL_TABLET | Freq: Every day | ORAL | 3 refills | Status: DC

## 2021-12-10 MED ORDER — CIPROFLOXACIN HCL 500 MG PO TABS: 500.0000 mg | ORAL_TABLET | Freq: Two times a day (BID) | ORAL | 0 refills | Status: DC

## 2021-12-10 NOTE — Progress Notes (Unsigned)
[facility]  [address] [place],  [place]  [address] [phone]  Clinic Day:  11/19/21  Referring physician: Raina Mina., MD   ASSESSMENT & PLAN:   1.  Stage IA triple positive left breast cancer, diagnosed in May 2013.  She was treated with left mastectomy in June.  She was started on tamoxifen 20 mg daily in July 2013, but this was discontinued in July 2017 due to concerns regarding endometrial hyperplasia.  She completed one year of therapy with trastuzumab in September 2014.     2.  Recurrent breast cancer, February 2022, with evidence of left axillary and regional lymph nodes as well as bone lesions.  She was subsequently placed on anastrozole which she started on Febuary 28th.  Trastuzumab every 3 weeks was recommended and started on March 18th 2022. Anastrozole was discontinued due to possible mental status toxicities, which in retrospect were more likely from the Mybetriq. She had been switched to fulvestrant injections since September.  3.  Significant elevation of the CA 27-29 which fluctuates, but was over 300 by October and 403 by November. It started to come down, from 368 to 344 in early January. However, by late January, it had significantly increased to 706.2.  The last reading was up to 782.8 and repeat went down slightly to 772.3.  However, it has now increased to 889 and we switched to oral letrozole on May 1, instead of the fulvestrant injections. If that is ineffective, another option would be to add in a low dose of palbociclib and I discussed the potential toxicities.  We could also target the HER2 with additional medication with agents such as pertuzumab.  They are not interested in pursuing aggressive IV chemotherapy.  4.  Lower extremity weakness and episodes of confusion, being evaluated by neurology and felt to be neuropathy.  She is currently on Mestinon 0.5 mg TID. She declines re-evaluation by physical therapy at this time.    5.  Osteoporosis of the forearm.  She received denosumab on March 3rd, but did not tolerate this therapy due to jaw pain, weakness and fatigue, and so it was discontinued.  We decided to place her on alendronate 70 mg weekly and she is tolerating this well.  She is no longer on oral calcium.   6.  Status post splenectomy.  She has started vaccines and had a severe reaction with the meningitis vaccines.  We will not pursue further meningitis vaccines.  7.  Hyponatremia.  This has been chronic and generally stable.  I advised that she include fluids such as Gatorade or Pedialyte.   8.  Thrombocytosis.  I feel this is reactive and it is worse today at 493,000.  We will continue to monitor.  Her CA 27.29 from late January significantly increased to 706.2 and was 782.8 in early March and went down to 772.3.  However the most recent 1 is now up to 889, so we switched her therapy to oral letrozole as of May 1. For now, she will proceed with a 21st cycle of trastuzumab this week.  She complains of increase in symptoms relating to her bone metastases, and complains of extreme weakness.  I think a lot of her leg pain is more with the and the patient has Tylenol 3.  We will check her urine since she has had recurrent urine infections.  Dr. Toma Deiters has prescribed a low-dose of Macrodantin as a very suppressive antibiotic and I explained that this is  an excellent idea and encouraged them to start it.  She knows to continue alendronate 70 mg weekly. Otherwise, we will plan to see her back in 3 weeks with CBC, CMP and CA 27.29 prior to her 22nd cycle of trastuzumab.  Her last echocardiogram was August 22, 2021 and I suggested that she have the next one at [facility] going forward.  Both she and her daughter verbalize understanding of and agreement to the plans discussed today. They know to call the office should any new questions or concerns arise.   I provided 20 minutes of face-to-face time during this this  encounter and > 50% was spent counseling as documented under my assessment and plan.    [name], MD [facility] [address] [place] [address] Dept: [phone] Dept Fax: [phone]    CHIEF COMPLAINT:  CC: History of recurrent breast cancer  Current Treatment:  Trastuzumab every 3 weeks, alendronate and letrozole   HISTORY OF PRESENT ILLNESS:  Amy Kline is a 88 y.o. female who presented as a transfer of care due to recurrent breast cancer. She was originally diagnosed with a stage IA left breast cancer in May 2013 when biopsy confirmed invasive ductal carcinoma, grade 2.  Estrogen and progesterone receptors were positive at 100% and 96%, and HER2 was negative.  She was treated with left mastectomy in June and repeat HER2 was positive by [facility].  She followed with Dr. Lurline Del, and was started on tamoxifen 20 mg daily in July 2013, but this was discontinued in July 2017 due to concerns regarding endometrial hyperplasia.  She completed one year of therapy with trastuzumab in September 2014.  She did have some cardiotoxicity with this therapy, and so this had to be held this for 5-6 weeks.   However, in the summer of 2021 she started having trouble walking with generalized lower extremity weakness and lower back pain.  She also was having episodes of syncope.  MRI brain from August was negative, but MRI lumbar spine from December revealed STIR hyperintense foci at T12-L1 and S1.  MRI pelvis showed similar areas, and in addition, numerous enhancing marrow replacing lesions in the pelvis.  Bone marrow biopsy from January 2022 showed no evidence of carcinoma and a normocellular marrow.  CT chest, abdomen and pelvis showed increased left axillary and subpectoral lymphadenopathy but no other evidence of metastatic disease.  She underwent left axillary lymph node biopsy on February 18th which confirmed  triple positive carcinoma involving fatty tissue.  She was subsequently placed on anastrozole which she started on Febuary 28th, and denosumab was initiated on March 3rd, but she did not tolerate this, so denosumab was stopped.  Trastuzumab was recommended, and initiated on March 18th.  ECHO from September 2021 revealed an ejection fraction between 60-65%.  Tumor markers from January revealed a CA 27-29 of 87, and CEA of 6.65.  Recent CA 27-29 from March is significantly elevated at 221, and CEA was 10.12.  Staging PET imaging from March confirmed metastatic breast cancer as evidenced by hypermetabolic left subpectoral/left axillary lymph nodes and widespread hypermetabolic osseous lesions.  There is mild hypermetabolism within an nonenlarged subcarinal lymph node and both hilar regions.  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  She is status post splenectomy, and has had both Prevnar 13 and pneumovax within the last few years. Bone density scan from March  revealed osteoporosis with a T-score of -3.3 of the forearm, and right femur neck measures -2.0, considered osteopenic.  She did have her meningitis vaccine and experienced severe soreness which is resolving. ECHO from June 30th revealed an EF between 55-60%.  She was placed on alendronate 70 mg weekly.  We stopped the anastrozole and switched her to fulvestrant injections with the 1st one administered September 2nd. New baseline CA 27.29 from September was 299.9, up from 261, and has steadily worsened. She had some worsening mental status changes which seemed to improve when the Vesicare was stopped. There may have been other factors involved such as mild hypercalcemia (up over 11.0), hormonal therapy, and her previous reaction to the meningitis vaccine.  INTERVAL HISTORY:  I have reviewed her chart and materials related to her cancer extensively and collaborated history with the patient. Summary of oncologic  history is as follows: Oncology History  Breast cancer of upper-inner quadrant of left female breast ([place])  12/02/2011 Cancer Staging   Staging form: Breast, AJCC 7th Edition - Pathologic: Stage IV (TX, NX, M1) - Signed by Chauncey Cruel, MD on 09/02/2020 Specimen type: Core Needle Biopsy Histopathologic type: Infiltrating duct carcinoma, NOS Laterality: Left   05/30/2013 Initial Diagnosis   Breast cancer of upper-inner quadrant of left female breast ([place])   09/27/2020 -  Chemotherapy   Patient is on Treatment Plan : BREAST Trastuzumab SubQ q21d     Metastasis to bone ([place])  07/03/2020 Initial Diagnosis   Metastasis to bone ([place])   09/27/2020 -  Chemotherapy   Patient is on Treatment Plan : BREAST Trastuzumab SubQ q21d      Tyshawna is here for routine follow up prior to a 21st cycle of trastuzumab. She states that she is doing fair but has had more weakness this week.  Her last CTA in April was negative for pulmonary embolism and just showed mild pulmonary fibrosis and similar appearance of the enlarged metastatic left axillary and subpectoral lymph nodes, as well as the sclerotic osseous metastatic lesions.  She is using ibuprofen 600 mg as needed for pain, and occasional Tylenol 3.  She does have pain and paresthesias of the lower extremities, consistent with neuropathy, which occasionally wakes her up during the night. Platelets have increased again to 493,000 and white count and hemoglobin are normal. Chemistries are unremarkable except for a BUN of 20, and an alkaline phosphatase of 293 and her SGOT is mildly elevated at 80.  Her sodium remains mildly low at 134.  We have stopped the fulvestrant injections.  We switched her to oral letrozole since her cancer marker continues to rise dramatically. Her  appetite is poor, and her weight has decreased 3 pounds since her last visit.  Her last echocardiogram was on February 10 and she will have the next one at [facility] tomorrow.  She  denies fever, chills or other signs of infection.  She denies nausea, vomiting, bowel issues, or abdominal pain.  She denies sore throat, cough, dyspnea, or chest pain.  She does complain of extreme weakness.  They request that we recheck a urine since she has had so many recurrent urinary tract infections.  I will therefore get a urinalysis and urine culture if she is able to give [place] a specimen.  HISTORY:   Allergies:  Allergies  Allergen Reactions  . Neosporin [Neomycin-Polymyxin-Gramicidin] Rash  . Sulfa Antibiotics Hives    itching  . Bacitracin-Polymyxin B   . Bexsero [Meningococcal Grp B (Recomb) Vaccine] Other (  See Comments)    Pt reported severe arthralgias and fatigue after 1st dose of bexsero and wishes to not receive again.  . Cefdinir Nausea And Vomiting  . Denosumab     Pain in jaw/head/mouth numb/weak  . Other Other (See Comments)    Polysporin causes a rash and swelling Unknown  . Statins     Current Medications: Current Outpatient Medications  Medication Sig Dispense Refill  . acetaminophen-codeine (TYLENOL #3) 300-30 MG tablet Take 1 tablet by mouth every 6 (six) hours as needed for moderate pain. 90 tablet 0  . alendronate (FOSAMAX) 70 MG tablet TAKE 1 TABLET BY MOUTH  WEEKLY WITH 8 OZ OF PLAIN  WATER 30 MINUTES BEFORE  FIRST FOOD, DRINK OR MEDS.  STAY UPRIGHT FOR 30 MINS 12 tablet 3  . amLODipine (NORVASC) 2.5 MG tablet TAKE 1 TABLET BY MOUTH DAILY 90 tablet 3  . aspirin 81 MG tablet Take 81 mg by mouth daily.    . carvedilol (COREG) 12.5 MG tablet Take 1 tablet (12.5 mg total) by mouth 2 (two) times daily with a meal. 180 tablet 3  . cyanocobalamin 1000 MCG tablet Take by mouth.    . ergocalciferol (VITAMIN D2) 1.25 MG (50000 UT) capsule Take 50,000 Units by mouth once a week.    . furosemide (LASIX) 20 MG tablet Take 20 mg by mouth daily.    Marland Kitchen ibuprofen (ADVIL) 600 MG tablet Take 600 mg by mouth as needed for headache.    . lactose free nutrition (BOOST) LIQD  Take 237 mLs by mouth as needed (other). for no appetite    . lamoTRIgine (LAMICTAL) 25 MG tablet Take 1 tablet (25 mg total) by mouth 2 (two) times daily. 60 tablet 5  . letrozole (FEMARA) 2.5 MG tablet Take 1 tablet (2.5 mg total) by mouth daily. 90 tablet 3  . nitrofurantoin (MACRODANTIN) 50 MG capsule Take 50 mg by mouth daily.    Marland Kitchen omeprazole (PRILOSEC) 20 MG capsule Take 20 mg by mouth at bedtime.     . pyridostigmine (MESTINON) 60 MG tablet Take by mouth.    Marland Kitchen SAVELLA 50 MG TABS tablet Take 50 mg by mouth 2 (two) times daily.    Marland Kitchen telmisartan-hydrochlorothiazide (MICARDIS HCT) 40-12.5 MG tablet Take 0.5 tablets by mouth daily. 45 tablet 1  . traMADol (ULTRAM) 50 MG tablet Take 1 tablet (50 mg total) by mouth every 6 (six) hours as needed. 60 tablet 0   No current facility-administered medications for this visit.    REVIEW OF SYSTEMS:  Review of Systems  Constitutional:  Negative for appetite change, chills, fatigue, fever and unexpected weight change.  HENT:  Negative.    Eyes: Negative.   Respiratory: Negative.  Negative for chest tightness, cough, hemoptysis, shortness of breath and wheezing.   Cardiovascular: Negative.  Negative for chest pain, leg swelling and palpitations.  Gastrointestinal:  Negative for abdominal distention, abdominal pain, blood in stool, constipation, diarrhea, nausea and vomiting.  Endocrine: Negative.   Genitourinary:  Negative for difficulty urinating, dysuria, frequency and hematuria.   Musculoskeletal:  Positive for back pain (of the lower back, chronic) and gait problem (in a wheelchair). Negative for arthralgias, flank pain and myalgias.  Skin: Negative.   Neurological:  Positive for extremity weakness, gait problem (in a wheelchair) and numbness (neuropathy/paresthesias of the lower extremities). Negative for dizziness, headaches, light-headedness, seizures and speech difficulty.  Hematological: Negative.   Psychiatric/Behavioral: Negative.   Negative for depression and sleep disturbance. The patient is not  nervous/anxious.      VITALS:  There were no vitals taken for this visit.  Wt Readings from Last 3 Encounters:  11/19/21 162 lb 1.6 oz (73.5 kg)  10/31/21 165 lb (74.8 kg)  10/18/21 163 lb (73.9 kg)    There is no height or weight on file to calculate BMI.  Performance status (ECOG): 1 - Symptomatic but completely ambulatory  PHYSICAL EXAM:  Physical Exam Constitutional:      General: She is not in acute distress.    Appearance: Normal appearance. She is normal weight.  HENT:     Head: Normocephalic and atraumatic.  Eyes:     General: No scleral icterus.    Extraocular Movements: Extraocular movements intact.     Conjunctiva/sclera: Conjunctivae normal.     Pupils: Pupils are equal, round, and reactive to light.  Cardiovascular:     Rate and Rhythm: Normal rate and regular rhythm.     Pulses: Normal pulses.     Heart sounds: Normal heart sounds. No murmur heard.   No friction rub. No gallop.  Pulmonary:     Effort: Pulmonary effort is normal. No respiratory distress.     Breath sounds: Normal breath sounds.  Abdominal:     General: Bowel sounds are normal. There is no distension.     Palpations: Abdomen is soft. There is no hepatomegaly, splenomegaly or mass.     Tenderness: There is no abdominal tenderness.  Musculoskeletal:        General: Normal range of motion.     Cervical back: Normal range of motion and neck supple.     Right lower leg: No edema.     Left lower leg: No edema.  Lymphadenopathy:     Cervical: No cervical adenopathy.  Skin:    General: Skin is warm and dry.  Neurological:     General: No focal deficit present.     Mental Status: She is alert and oriented to person, place, and time. Mental status is at baseline.  Psychiatric:        Mood and Affect: Mood normal.        Behavior: Behavior normal.        Thought Content: Thought content normal.        Judgment: Judgment normal.      LABS:      Latest Ref Rng & Units 11/19/2021   12:00 AM 10/29/2021   12:00 AM 10/18/2021    2:33 PM  CBC  WBC  9.4      9.9      11.9    Hemoglobin 12.0 - 16.0 12.7      12.9      13.9    Hematocrit 36 - 46 38      39      40.5    Platelets 150 - 400 K/uL 493      409      537       This result is from an external source.       Latest Ref Rng & Units 11/19/2021   12:00 AM 10/29/2021   12:00 AM 10/18/2021    2:33 PM  CMP  Glucose 70 - 99 mg/dL   112    BUN 4 - _0 Creatinine 0.5 - 1.1 0.8      0.7      1.05    Sodium 137 - 147  134      134      133    Potassium 3.5 - 5.1 mEq/L 4.3      4.0      3.7    Chloride 99 - 108 98      98      99    CO2 13 - _0 Calcium 8.7 - 10.7 9.8      10.2      9.9    Alkaline Phos 25 - 125 293      255        AST 13 - 35 80      51        ALT 7 - 35 U/L 27      22           This result is from an external source.      Lab Results  Component Value Date   CEA1 8.3 (H) 02/19/2021   /  CEA  Date Value Ref Range Status  02/19/2021 8.3 (H) 0.0 - 4.7 ng/mL Final    Comment:    (NOTE)                             Nonsmokers          <3.9                             Smokers             <5.6 Roche [employer] Electrochemiluminescence Immunoassay (ECLIA) Values obtained with different assay methods or kits cannot be used interchangeably.  Results cannot be interpreted as absolute evidence of the presence or absence of malignant disease. Performed At: [facility] [place], [place] [id number] Rush Farmer MD [id number]     Lab Results  Component Value Date   HFW263 23.7 07/22/2020      STUDIES:  No results found.

## 2021-12-10 NOTE — Telephone Encounter (Signed)
Per 12/10/21 los next appt scheduled and confirmed with patient

## 2021-12-11 LAB — CANCER ANTIGEN 27.29: CA 27.29: 1167.7 U/mL — ABNORMAL HIGH (ref 0.0–38.6)

## 2021-12-12 ENCOUNTER — Encounter: Payer: Self-pay | Admitting: Oncology

## 2021-12-12 ENCOUNTER — Ambulatory Visit: Payer: Medicare Other

## 2021-12-12 LAB — URINE CULTURE: Culture: >=100000 — AB

## 2021-12-15 ENCOUNTER — Other Ambulatory Visit: Payer: Self-pay | Admitting: Hematology and Oncology

## 2021-12-15 MED ORDER — NITROFURANTOIN MACROCRYSTAL 100 MG PO CAPS: 100.0000 mg | ORAL_CAPSULE | Freq: Four times a day (QID) | ORAL | 0 refills | Status: DC

## 2021-12-16 ENCOUNTER — Other Ambulatory Visit: Payer: Self-pay | Admitting: Hematology and Oncology

## 2021-12-16 ENCOUNTER — Other Ambulatory Visit: Payer: Self-pay

## 2021-12-16 ENCOUNTER — Telehealth: Payer: Self-pay

## 2021-12-16 MED ORDER — FUROSEMIDE 20 MG PO TABS
20.0000 mg | ORAL_TABLET | Freq: Every day | ORAL | 3 refills | Status: AC
Start: 2021-12-16 — End: ?

## 2021-12-16 MED ORDER — FOSFOMYCIN TROMETHAMINE 3 G PO PACK: 3.0000 g | PACK | Freq: Once | ORAL | 0 refills | Status: DC

## 2021-12-16 MED ORDER — FLUCONAZOLE 150 MG PO TABS: 150.0000 mg | ORAL_TABLET | Freq: Once | ORAL | 0 refills | Status: DC

## 2021-12-16 NOTE — Telephone Encounter (Signed)
12/16/2021 Pt's daughter notified that new medication has been sent in.  12/16/2021 - Macrodantin was not listed on pt's allergy list. Pt's daughter,[name], states "we told Dr [name] when it happened, but she must have forgotten to put on chart".    12/15/21 - Melissa P,NP, and Ulice Dash, pharmacist, reviewed culture and decided to place pt on macrodantin.

## 2021-12-16 NOTE — Telephone Encounter (Signed)
Furosemide 20 mg # 90 x 3 refills sent to [facility]

## 2021-12-17 ENCOUNTER — Encounter: Payer: Self-pay | Admitting: Oncology

## 2021-12-17 ENCOUNTER — Other Ambulatory Visit: Payer: Self-pay | Admitting: Hematology and Oncology

## 2021-12-17 MED ORDER — EPINEPHRINE 0.3 MG/0.3ML IJ SOAJ
0.3000 mg | INTRAMUSCULAR | 1 refills | Status: AC | PRN
Start: 2021-12-17 — End: ?

## 2021-12-19 ENCOUNTER — Inpatient Hospital Stay: Payer: Medicare Other | Attending: Oncology

## 2021-12-19 VITALS — BP 112/60 | HR 81 | Temp 97.5°F | Resp 18

## 2021-12-19 DIAGNOSIS — R413 Other amnesia: Secondary | ICD-10-CM | POA: Diagnosis not present

## 2021-12-19 DIAGNOSIS — C50212 Malignant neoplasm of upper-inner quadrant of left female breast: Secondary | ICD-10-CM | POA: Diagnosis present

## 2021-12-19 DIAGNOSIS — Z5111 Encounter for antineoplastic chemotherapy: Secondary | ICD-10-CM | POA: Diagnosis present

## 2021-12-19 DIAGNOSIS — C7951 Secondary malignant neoplasm of bone: Secondary | ICD-10-CM

## 2021-12-19 DIAGNOSIS — Z170 Estrogen receptor positive status [ER+]: Secondary | ICD-10-CM | POA: Insufficient documentation

## 2021-12-19 DIAGNOSIS — C773 Secondary and unspecified malignant neoplasm of axilla and upper limb lymph nodes: Secondary | ICD-10-CM | POA: Insufficient documentation

## 2021-12-19 MED ORDER — METHYLPREDNISOLONE SODIUM SUCC 125 MG IJ SOLR: 125.0000 mg | Freq: Once | INTRAMUSCULAR | Status: DC | PRN

## 2021-12-19 MED ORDER — SODIUM CHLORIDE 0.9% FLUSH: 10.0000 mL | INTRAVENOUS | Status: DC | PRN

## 2021-12-19 MED ORDER — ALTEPLASE 2 MG IJ SOLR: 2.0000 mg | Freq: Once | INTRAMUSCULAR | Status: DC | PRN

## 2021-12-19 MED ORDER — LORATADINE 10 MG PO TABS: 10.0000 mg | ORAL_TABLET | Freq: Once | ORAL | Status: DC

## 2021-12-19 MED ORDER — ALBUTEROL SULFATE (2.5 MG/3ML) 0.083% IN NEBU: 2.5000 mg | INHALATION_SOLUTION | Freq: Once | RESPIRATORY_TRACT | Status: DC | PRN

## 2021-12-19 MED ORDER — FAMOTIDINE IN NACL 20-0.9 MG/50ML-% IV SOLN: 20.0000 mg | Freq: Once | INTRAVENOUS | Status: DC | PRN

## 2021-12-19 MED ORDER — ACETAMINOPHEN 325 MG PO TABS: 650.0000 mg | ORAL_TABLET | Freq: Once | ORAL | Status: DC

## 2021-12-19 MED ORDER — EPINEPHRINE 0.3 MG/0.3ML IJ SOAJ: 0.3000 mg | Freq: Once | INTRAMUSCULAR | Status: DC | PRN

## 2021-12-19 MED ORDER — HEPARIN SOD (PORK) LOCK FLUSH 100 UNIT/ML IV SOLN: 250.0000 [IU] | Freq: Once | INTRAVENOUS | Status: DC | PRN

## 2021-12-19 MED ORDER — SODIUM CHLORIDE 0.9% FLUSH: 3.0000 mL | INTRAVENOUS | Status: DC | PRN

## 2021-12-19 MED ORDER — DIPHENHYDRAMINE HCL 50 MG/ML IJ SOLN: 50.0000 mg | Freq: Once | INTRAMUSCULAR | Status: DC | PRN

## 2021-12-19 MED ORDER — SODIUM CHLORIDE 0.9 % IV SOLN: Freq: Once | INTRAVENOUS | Status: DC | PRN

## 2021-12-19 MED ORDER — TRASTUZUMAB-HYALURONIDASE-OYSK 600-10000 MG-UNT/5ML ~~LOC~~ SOLN
600.0000 mg | Freq: Once | SUBCUTANEOUS | Status: AC
  Administered 2021-12-19: 600 mg via SUBCUTANEOUS
  Filled 2021-12-19: qty 5

## 2021-12-19 MED ORDER — SODIUM CHLORIDE 0.9 % IV SOLN: Freq: Once | INTRAVENOUS | Status: DC

## 2021-12-19 MED ORDER — HEPARIN SOD (PORK) LOCK FLUSH 100 UNIT/ML IV SOLN: 500.0000 [IU] | Freq: Once | INTRAVENOUS | Status: DC | PRN

## 2021-12-19 NOTE — Patient Instructions (Signed)
Trastuzumab; Hyaluronidase injection What is this medication? TRASTUZUMAB; HYALURONIDASE (tras TOO zoo mab / hye al ur ON i dase) is used to treat breast cancer and stomach cancer. Trastuzumab is a monoclonal antibody. Hyaluronidase is used to improve the effects of trastuzumab. This medicine may be used for other purposes; ask your health care provider or pharmacist if you have questions. COMMON BRAND NAME(S): HERCEPTIN HYLECTA What should I tell my care team before I take this medication? They need to know if you have any of these conditions: heart disease heart failure lung or breathing disease, like asthma an unusual or allergic reaction to trastuzumab, or other medications, foods, dyes, or preservatives pregnant or trying to get pregnant breast-feeding How should I use this medication? This medicine is for injection under the skin. It is given by a health care professional in a hospital or clinic setting. Talk to your pediatrician regarding the use of this medicine in children. This medicine is not approved for use in children. Overdosage: If you think you have taken too much of this medicine contact a poison control center or emergency room at once. NOTE: This medicine is only for you. Do not share this medicine with others. What if I miss a dose? It is important not to miss a dose. Call your doctor or health care professional if you are unable to keep an appointment. What may interact with this medication? This medicine may interact with the following medications: certain types of chemotherapy, such as daunorubicin, doxorubicin, epirubicin, and idarubicin This list may not describe all possible interactions. Give your health care provider a list of all the medicines, herbs, non-prescription drugs, or dietary supplements you use. Also tell them if you smoke, drink alcohol, or use illegal drugs. Some items may interact with your medicine. What should I watch for while using this  medication? Visit your doctor for checks on your progress. Report any side effects. Continue your course of treatment even though you feel ill unless your doctor tells you to stop. Call your doctor or health care professional for advice if you get a fever, chills or sore throat, or other symptoms of a cold or flu. Do not treat yourself. Try to avoid being around people who are sick. You may experience fever, chills and shaking during your first infusion. These effects are usually mild and can be treated with other medicines. Report any side effects during the infusion to your health care professional. Fever and chills usually do not happen with later infusions. Do not become pregnant while taking this medicine or for 7 months after stopping it. Women should inform their doctor if they wish to become pregnant or think they might be pregnant. Women of child-bearing potential will need to have a negative pregnancy test before starting this medicine. There is a potential for serious side effects to an unborn child. Talk to your health care professional or pharmacist for more information. Do not breast-feed an infant while taking this medicine or for 7 months after stopping it. What side effects may I notice from receiving this medication? Side effects that you should report to your doctor or health care professional as soon as possible: allergic reactions like skin rash, itching or hives, swelling of the face, lips, or tongue breathing problems chest pain or palpitations cough fever general ill feeling or flu-like symptoms signs of worsening heart failure like breathing problems; swelling in your legs and feet Side effects that usually do not require medical attention (report these to your doctor  or health care professional if they continue or are bothersome): bone pain changes in taste diarrhea joint pain nausea/vomiting unusually weak or tired weight loss This list may not describe all possible  side effects. Call your doctor for medical advice about side effects. You may report side effects to FDA at 1-800-FDA-1088. Where should I keep my medication? This drug is given in a hospital or clinic and will not be stored at home. NOTE: This sheet is a summary. It may not cover all possible information. If you have questions about this medicine, talk to your doctor, pharmacist, or health care provider.  2023 Elsevier/Gold Standard (2017-11-15 00:00:00)  
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
## 2021-12-22 ENCOUNTER — Encounter: Payer: Self-pay | Admitting: Cardiology

## 2021-12-22 ENCOUNTER — Ambulatory Visit (INDEPENDENT_AMBULATORY_CARE_PROVIDER_SITE_OTHER): Payer: Medicare Other | Admitting: Cardiology

## 2021-12-22 VITALS — BP 124/66 | HR 86 | Ht 66.0 in | Wt 160.8 lb

## 2021-12-22 DIAGNOSIS — R079 Chest pain, unspecified: Secondary | ICD-10-CM | POA: Diagnosis not present

## 2021-12-22 DIAGNOSIS — R55 Syncope and collapse: Secondary | ICD-10-CM | POA: Diagnosis not present

## 2021-12-22 DIAGNOSIS — I119 Hypertensive heart disease without heart failure: Secondary | ICD-10-CM

## 2021-12-22 NOTE — Patient Instructions (Addendum)
Medication Instructions:  Your physician recommends that you continue on your current medications as directed. Please refer to the Current Medication list given to you today.  *If you need a refill on your cardiac medications before your next appointment, please call your pharmacy*   Lab Work: None If you have labs (blood work) drawn today and your tests are completely normal, you will receive your results only by: [place] (if you have MyChart) OR A paper copy in the mail If you have any lab test that is abnormal or we need to change your treatment, we will call you to review the results.   Testing/Procedures: None   Follow-Up: At [facility], you and your health needs are our priority.  As part of our continuing mission to provide you with exceptional heart care, we have created designated Provider Care Teams.  These Care Teams include your primary Cardiologist (physician) and Advanced Practice Providers (APPs -  Physician Assistants and Nurse Practitioners) who all work together to provide you with the care you need, when you need it.  We recommend signing up for the patient portal called "MyChart".  Sign up information is provided on this After Visit Summary.  MyChart is used to connect with patients for Virtual Visits (Telemedicine).  Patients are able to view lab/test results, encounter notes, upcoming appointments, etc.  Non-urgent messages can be sent to your provider as well.   To learn more about what you can do with MyChart, go to [email or web address].    Your next appointment:   6 month(s)  The format for your next appointment:   In Person  Provider:   Shirlee More, MD    Other Instructions Get an Apple watch to record if you have syncope  Important Information About Sugar         [facility]  Tips to measure your blood pressure correctly  To determine whether you have hypertension, a medical professional will take a blood pressure reading. How  you prepare for the test, the position of your arm, and other factors can change a blood pressure reading by 10% or more. That could be enough to hide high blood pressure, start you on a drug you don't really need, or lead your doctor to incorrectly adjust your medications. National and international guidelines offer specific instructions for measuring blood pressure. If a doctor, nurse, or medical assistant isn't doing it right, don't hesitate to ask him or her to get with the guidelines. Here's what you can do to ensure a correct reading:  Don't drink a caffeinated beverage or smoke during the 30 minutes before the test.  Sit quietly for five minutes before the test begins.  During the measurement, sit in a chair with your feet on the floor and your arm supported so your elbow is at about heart level.  The inflatable part of the cuff should completely cover at least 80% of your upper arm, and the cuff should be placed on bare skin, not over a shirt.  Don't talk during the measurement.  Have your blood pressure measured twice, with a brief break in between. If the readings are different by 5 points or more, have it done a third time. There are times to break these rules. If you sometimes feel lightheaded when getting out of bed in the morning or when you stand after sitting, you should have your blood pressure checked while seated and then while standing to see if it falls from one position to the next. Because  blood pressure varies throughout the day, your doctor will rarely diagnose hypertension on the basis of a single reading. Instead, he or she will want to confirm the measurements on at least two occasions, usually within a few weeks of one another. The exception to this rule is if you have a blood pressure reading of 180/110 mm Hg or higher. A result this high usually calls for prompt treatment. It's also a good idea to have your blood pressure measured in both arms at least once, since the  reading in one arm (usually the right) may be higher than that in the left. A 2014 study in The American Journal of Medicine of nearly 3,400 people found average arm- to-arm differences in systolic blood pressure of about 5 points. The higher number should be used to make treatment decisions. In 2017, new guidelines from the [place], the [employer] of Cardiology, and nine other health organizations lowered the diagnosis of high blood pressure to 130/80 mm Hg or higher for all adults. The guidelines also redefined the various blood pressure categories to now include normal, elevated, Stage 1 hypertension, Stage 2 hypertension, and hypertensive crisis (see "Blood pressure categories"). Blood pressure categories  Blood pressure category SYSTOLIC (upper number)  DIASTOLIC (lower number)  Normal Less than 120 mm Hg and Less than 80 mm Hg  Elevated 120-129 mm Hg and Less than 80 mm Hg  High blood pressure: Stage 1 hypertension 130-139 mm Hg or 80-89 mm Hg  High blood pressure: Stage 2 hypertension 140 mm Hg or higher or 90 mm Hg or higher  Hypertensive crisis (consult your doctor immediately) Higher than 180 mm Hg and/or Higher than 120 mm Hg  Source: American Heart Association and American Stroke Association. For more on getting your blood pressure under control, buy Controlling Your Blood Pressure, a Special Health Report from [facility].

## 2021-12-25 ENCOUNTER — Other Ambulatory Visit: Payer: Self-pay | Admitting: Hematology and Oncology

## 2021-12-25 ENCOUNTER — Telehealth: Payer: Self-pay

## 2021-12-25 ENCOUNTER — Other Ambulatory Visit: Payer: Self-pay

## 2021-12-25 ENCOUNTER — Inpatient Hospital Stay: Payer: Medicare Other

## 2021-12-25 DIAGNOSIS — Z5111 Encounter for antineoplastic chemotherapy: Secondary | ICD-10-CM | POA: Diagnosis not present

## 2021-12-25 DIAGNOSIS — N390 Urinary tract infection, site not specified: Secondary | ICD-10-CM

## 2021-12-25 LAB — URINALYSIS, COMPLETE (UACMP) WITH MICROSCOPIC
Bacteria, UA: NONE SEEN
Bilirubin Urine: NEGATIVE
Glucose, UA: NEGATIVE mg/dL
Ketones, ur: NEGATIVE mg/dL
Leukocytes,Ua: NEGATIVE
Nitrite: NEGATIVE
Protein, ur: NEGATIVE mg/dL
Specific Gravity, Urine: 1.011 (ref 1.005–1.030)
pH: 6 (ref 5.0–8.0)

## 2021-12-25 MED ORDER — ERGOCALCIFEROL 1.25 MG (50000 UT) PO CAPS: 50000.0000 [IU] | ORAL_CAPSULE | ORAL | 0 refills | Status: DC

## 2021-12-25 NOTE — Telephone Encounter (Signed)
Spoke with daughter , patient with no complaints of burning, frequency ,urgency or fever . Daughter may bring in a specimen for U/A and culture , she voiced her mother couldn't remember the names of her great grandchildren at times and she is concerned this may be a side effect of UTI.

## 2021-12-25 NOTE — Telephone Encounter (Signed)
-----   Message from Melodye Ped, NP sent at 12/25/2021  1:48 PM EDT ----- Regarding: RE: vitamin D Prescription sent ----- Message ----- From: Daryel November, LPN Sent: 2/99/3716   1:34 PM EDT To: Melodye Ped, NP Subject: vitamin D                                      Kim at [facility] [phone] left a message she is awaiting a Vitamin D script for patient .

## 2021-12-25 NOTE — Telephone Encounter (Signed)
Noted  

## 2021-12-25 NOTE — Telephone Encounter (Signed)
-----   Message from Melodye Ped, NP sent at 12/25/2021  1:49 PM EDT ----- If symptomatic, we will repeat urine studies ----- Message ----- From: Daryel November, LPN Sent: 11/27/9840  12:19 PM EDT To: Melodye Ped, NP  Patients daughter Aleda Grana [phone] is wanting to know about last antibiotic given to treat UTI , and possible repeat dose and how affective this medication is, also when to repeat a urine specimen or culture.

## 2021-12-26 ENCOUNTER — Ambulatory Visit: Payer: Medicare Other

## 2021-12-26 LAB — URINE CULTURE

## 2021-12-29 ENCOUNTER — Telehealth: Payer: Self-pay

## 2021-12-29 ENCOUNTER — Other Ambulatory Visit: Payer: Self-pay | Admitting: Hematology and Oncology

## 2021-12-29 DIAGNOSIS — N390 Urinary tract infection, site not specified: Secondary | ICD-10-CM

## 2021-12-29 NOTE — Telephone Encounter (Signed)
[name] patients daughter notified,will repeat urine with clean catch specimen.

## 2021-12-29 NOTE — Telephone Encounter (Signed)
-----   Message from Melodye Ped, NP sent at 12/29/2021 12:03 PM EDT ----- Regarding: RE: urine results The culture shows multiple species present, suggest recollection. Can she bring her in for repeat/ ----- Message ----- From: Daryel November, LPN Sent: 8/48/5927  11:48 AM EDT To: Melodye Ped, NP Subject: urine results                                  Patients daughter Amy Kline has reviewed urine results and wants a call with intrepretation , and further treatment plan thanks.

## 2021-12-31 ENCOUNTER — Ambulatory Visit: Payer: Medicare Other | Admitting: Oncology

## 2021-12-31 ENCOUNTER — Other Ambulatory Visit: Payer: Medicare Other

## 2022-01-01 ENCOUNTER — Telehealth: Payer: Self-pay

## 2022-01-01 NOTE — Telephone Encounter (Signed)
-----   Message from Melodye Ped, NP sent at 01/01/2022  1:47 PM EDT ----- Regarding: RE: urinalysis Yes, it looks fine. No bacteria noted.  ----- Message ----- From: Georgette Shell, RN Sent: 01/01/2022   1:30 PM EDT To: Melodye Ped, NP Subject: urinalysis                                     Urinalysis done on 12/25/2021, results in Epic.  Look okay, what do you think.  I need to call daughter back.

## 2022-01-01 NOTE — Telephone Encounter (Signed)
Spoke with [name]

## 2022-01-02 ENCOUNTER — Ambulatory Visit: Payer: Medicare Other

## 2022-01-05 ENCOUNTER — Encounter: Payer: Self-pay | Admitting: Oncology

## 2022-01-06 NOTE — Progress Notes (Signed)
[facility]  [address] [place],  [place]  [address] [phone]  Clinic Day:  01/08/22  Referring physician: Raina Mina., MD   ASSESSMENT & PLAN:   1.  Stage IA triple positive left breast cancer, diagnosed in May 2013.  She was treated with left mastectomy in June.  She was started on tamoxifen 20 mg daily in July 2013, but this was discontinued in July 2017 due to concerns regarding endometrial hyperplasia.  She completed one year of therapy with trastuzumab in September 2014.     2.  Recurrent breast cancer, February 2022, with evidence of left axillary and regional lymph nodes as well as bone lesions.  She was subsequently placed on anastrozole which she started on Febuary 28th.  Trastuzumab every 3 weeks was recommended and started on March 18th 2022. Anastrozole was discontinued due to possible mental status toxicities, which in retrospect were more likely from the Mybetriq. She had been switched to fulvestrant injections since September, but then changed to letrozole in May due to progression.  3.  Significant elevation of the CA 27-29 which fluctuates, but was over 300 by October and 403 by November. It started to come down, from 368 to 344 in early January. However, by late January, it had significantly increased to 706.2.  However, it has now increased to 889 and we switched to oral letrozole on May 1, instead of the fulvestrant injections. If that is ineffective, another option would be to add in a low dose of palbociclib but I doubt she could tolerate that.  We could also target the HER2 with additional medication with agents such as pertuzumab.  They are not interested in pursuing aggressive IV chemotherapy.  4.  Lower extremity weakness and episodes of confusion, being evaluated by neurology and felt to be neuropathy.  She is currently on Mestinon 0.5 mg TID. She declines re-evaluation by physical therapy at this time.   5.  Osteoporosis of  the forearm.  She received denosumab on March 3rd, but did not tolerate this therapy due to jaw pain, weakness and fatigue, and so it was discontinued.  We decided to place her on alendronate 70 mg weekly and she is tolerating this well.  She is no longer on oral calcium as she has chronic mild hypercalcemia.   6.  Status post splenectomy.  She has started vaccines and had a severe reaction with the meningitis vaccines.  We will not pursue further meningitis vaccines.  7.  Hyponatremia.  This has been chronic and generally stable.  I advised that she include fluids such as Gatorade or Pedialyte.   8.  Thrombocytosis.  I feel this is reactive and it is better today at 515,000.  We will continue to monitor.  Her CA 27.29 from late January significantly increased to 706.2 and up to 889 in April, so we switched her therapy to oral letrozole as of May 1. It was up to 923.8, and then to 1167 on 5/31. For now, she will proceed with a 23rd cycle of trastuzumab this week. We discussed possibly adding Perjeta.  We rechecked her urine since she has had recurrent urine infections, and it is clear. I will order Cipro 250 mg daily as a urinary suppressive antibiotic.She knows to continue alendronate 70 mg weekly. Otherwise, we will plan to see her back in 3 weeks with CBC, CMP and CA 27.29 prior to her 24th cycle of trastuzumab.  Her last echocardiogram was October 30, 2021  at [facility] and was good, with an EF of 60-65%, so she will be due in July.  I have recommended heat for her arthralgias. Both she and her daughter verbalize understanding of and agreement to the plans discussed today. They know to call the office should any new questions or concerns arise.   I provided 20 minutes of face-to-face time during this this encounter and > 50% was spent counseling as documented under my assessment and plan.    [name], MD [employer] [address] [place] [address] Dept: [phone] Dept Fax: [phone]    CHIEF COMPLAINT:  CC: History of recurrent breast cancer  Current Treatment:  Trastuzumab every 3 weeks, alendronate and letrozole   HISTORY OF PRESENT ILLNESS:  Amy Kline is a 88 y.o. female who presented as a transfer of care due to recurrent breast cancer. She was originally diagnosed with a stage IA left breast cancer in May 2013 when biopsy confirmed invasive ductal carcinoma, grade 2.  Estrogen and progesterone receptors were positive at 100% and 96%, and HER2 was negative.  She was treated with left mastectomy in June and repeat HER2 was positive by [facility].  She followed with Dr. Lurline Del, and was started on tamoxifen 20 mg daily in July 2013, but this was discontinued in July 2017 due to concerns regarding endometrial hyperplasia.  She completed one year of therapy with trastuzumab in September 2014.  She did have some cardiotoxicity with this therapy, and so this had to be held this for 5-6 weeks.   However, in the summer of 2021 she started having trouble walking with generalized lower extremity weakness and lower back pain.  She also was having episodes of syncope.  MRI brain from August was negative, but MRI lumbar spine from December revealed STIR hyperintense foci at T12-L1 and S1.  MRI pelvis showed similar areas, and in addition, numerous enhancing marrow replacing lesions in the pelvis.  Bone marrow biopsy from January 2022 showed no evidence of carcinoma and a normocellular marrow.  CT chest, abdomen and pelvis showed increased left axillary and subpectoral lymphadenopathy but no other evidence of metastatic disease.  She underwent left axillary lymph node biopsy on February 18th which confirmed triple positive carcinoma involving fatty tissue.  She was subsequently placed on anastrozole which she started on Febuary 28th, and denosumab was initiated on March 3rd, but she did not tolerate  this, so it was stopped.  Trastuzumab was recommended, and initiated on March 18th.  ECHO from September 2021 revealed an ejection fraction between 60-65%.  Tumor markers from January revealed a CA 27-29 of 87, and CEA of 6.65.  Recent CA 27-29 from March is significantly elevated at 221, and CEA was 10.12.  Staging PET imaging from March confirmed metastatic breast cancer as evidenced by hypermetabolic left subpectoral/left axillary lymph nodes and widespread hypermetabolic osseous lesions.  There is mild hypermetabolism within an nonenlarged subcarinal lymph node and both hilar regions.  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  She is status post splenectomy, and has had both Prevnar 13 and pneumovax within the last few years. Bone density scan from March revealed osteoporosis with a T-score of -3.3 of the forearm, and right femur neck measures -2.0, considered osteopenic.  She did have her meningitis vaccine and experienced severe soreness which is resolving. ECHO from June 30th revealed an EF  between 55-60%.  She was placed on alendronate 70 mg weekly.  We stopped the anastrozole and switched her to fulvestrant injections with the 1st one administered September 2nd. New baseline CA 27.29 from September was 299.9, up from 261, and has steadily worsened. She had some worsening mental status changes which seemed to improve when the Vesicare was stopped. There may have been other factors involved such as mild hypercalcemia (up over 11.0), hormonal therapy, and her previous reaction to the meningitis vaccine.  INTERVAL HISTORY:  I have reviewed her chart and materials related to her cancer extensively and collaborated history with the patient. Summary of oncologic history is as follows: Oncology History  Breast cancer of upper-inner quadrant of left female breast ([place])  12/02/2011 Cancer Staging   Staging form: Breast, AJCC 7th Edition - Pathologic: Stage IV (TX,  NX, M1) - Signed by Chauncey Cruel, MD on 09/02/2020 Specimen type: Core Needle Biopsy Histopathologic type: Infiltrating duct carcinoma, NOS Laterality: Left   05/30/2013 Initial Diagnosis   Breast cancer of upper-inner quadrant of left female breast ([place])   09/27/2020 -  Chemotherapy   Patient is on Treatment Plan : BREAST Trastuzumab SubQ q21d     Metastasis to bone ([place])  07/03/2020 Initial Diagnosis   Metastasis to bone ([place])   09/27/2020 -  Chemotherapy   Patient is on Treatment Plan : BREAST Trastuzumab SubQ q21d      Amy Kline is here for routine follow up prior to a 23rd cycle of trastuzumab. She states that she is weak and slipped off the bed last week. She has severe fatigue and lack of energy. Her last CTA in April was negative for pulmonary embolism and just showed mild pulmonary fibrosis and similar appearance of the enlarged metastatic left axillary and subpectoral lymph nodes, as well as the sclerotic osseous metastatic lesions.  She is using ibuprofen 600 mg as needed for pain, and occasional Tylenol 3.  She does have pain and paresthesias of the lower extremities, consistent with neuropathy, which occasionally wakes her up during the night. Platelets have increased again to 515,000 and white count and hemoglobin are normal. Chemistries are unremarkable except for a BUN of 22, and and her SGOT is more elevated at 113.  We switched her to oral letrozole but her cancer marker continues to rise dramatically. The latest one went from 923.8 to 1167.7.  Her  appetite is fair.  Her last echocardiogram was on April 20 at [facility], and looked good, with an EF of 60-65%, so she will be due again in July.  She denies fever, chills or other signs of infection.  She denies nausea, vomiting, bowel issues, or abdominal pain.  She denies sore throat, cough, dyspnea, or chest pain.  They request that we recheck a urine since she has had so many recurrent urinary tract infections.  That sample  looks clear but we have discussed using low dose Cipro as a urinary suppressive antibiotic, and I will order that.  HISTORY:   Allergies:  Allergies  Allergen Reactions   Neosporin [Neomycin-Polymyxin-Gramicidin] Rash   Sulfa Antibiotics Hives    itching   Bacitra-Neomycin-Polymyxin-Hc    Bacitracin-Polymyxin B    Bexsero [Meningococcal Grp B (Recomb) Vaccine] Other (See Comments)    Pt reported severe arthralgias and fatigue after 1st dose of bexsero and wishes to not receive again.   Cefdinir Nausea And Vomiting   Denosumab     Pain in jaw/head/mouth numb/weak   Other Other (See Comments)  Polysporin causes a rash and swelling Unknown   Statins    Macrodantin [Nitrofurantoin] Itching and Rash    Pt's daughter,Amy Kline, called to state that the last time Dr Bea Graff put her on this med, she had a lot of itching and redness in areas.    Current Medications: Current Outpatient Medications  Medication Sig Dispense Refill   acetaminophen-codeine (TYLENOL #3) 300-30 MG tablet Take 1 tablet by mouth every 6 (six) hours as needed for moderate pain. 90 tablet 0   alendronate (FOSAMAX) 70 MG tablet TAKE 1 TABLET BY MOUTH  WEEKLY WITH 8 OZ OF PLAIN  WATER 30 MINUTES BEFORE  FIRST FOOD, DRINK OR MEDS.  STAY UPRIGHT FOR 30 MINS 12 tablet 3   amLODipine (NORVASC) 2.5 MG tablet TAKE 1 TABLET BY MOUTH DAILY 90 tablet 3   aspirin 81 MG tablet Take 81 mg by mouth daily.     carvedilol (COREG) 12.5 MG tablet Take 1 tablet (12.5 mg total) by mouth 2 (two) times daily with a meal. 180 tablet 3   cyanocobalamin 1000 MCG tablet Take by mouth.     EPINEPHrine (EPIPEN 2-PAK) 0.3 mg/0.3 mL IJ SOAJ injection Inject 0.3 mg into the muscle as needed for anaphylaxis. 1 each 1   ergocalciferol (VITAMIN D2) 1.25 MG (50000 UT) capsule Take 1 capsule (50,000 Units total) by mouth once a week. 60 capsule 0   fluconazole (DIFLUCAN) 150 MG tablet TAKE 1 TABLET(150 MG) BY MOUTH 1 TIME FOR 1 DOSE 1 tablet 0    furosemide (LASIX) 20 MG tablet Take 1 tablet (20 mg total) by mouth daily. 90 tablet 3   ibuprofen (ADVIL) 600 MG tablet Take 1 tablet (600 mg total) by mouth as needed for headache. 30 tablet 2   lactose free nutrition (BOOST) LIQD Take 237 mLs by mouth as needed (other). for no appetite     lamoTRIgine (LAMICTAL) 25 MG tablet Take 1 tablet (25 mg total) by mouth 2 (two) times daily. 60 tablet 5   letrozole (FEMARA) 2.5 MG tablet Take 1 tablet (2.5 mg total) by mouth daily. 90 tablet 3   loratadine (CLARITIN) 10 MG tablet Take 10 mg by mouth daily.     omeprazole (PRILOSEC) 20 MG capsule Take 20 mg by mouth at bedtime.      pyridostigmine (MESTINON) 60 MG tablet Take by mouth.     SAVELLA 50 MG TABS tablet Take 50 mg by mouth 2 (two) times daily.     telmisartan-hydrochlorothiazide (MICARDIS HCT) 40-12.5 MG tablet Take 0.5 tablets by mouth daily. 45 tablet 1   traMADol (ULTRAM) 50 MG tablet Take 1 tablet (50 mg total) by mouth every 6 (six) hours as needed. 60 tablet 0   No current facility-administered medications for this visit.    REVIEW OF SYSTEMS:  Review of Systems  Constitutional:  Negative for appetite change, chills, fatigue, fever and unexpected weight change.  HENT:  Negative.    Eyes: Negative.   Respiratory: Negative.  Negative for chest tightness, cough, hemoptysis, shortness of breath and wheezing.   Cardiovascular: Negative.  Negative for chest pain, leg swelling and palpitations.  Gastrointestinal:  Negative for abdominal distention, abdominal pain, blood in stool, constipation, diarrhea, nausea and vomiting.  Endocrine: Negative.   Genitourinary:  Negative for difficulty urinating, dysuria, frequency and hematuria.   Musculoskeletal:  Positive for back pain (of the lower back, chronic) and gait problem (in a wheelchair). Negative for arthralgias, flank pain and myalgias.  Skin: Negative.   Neurological:  Positive for extremity weakness, gait problem (in a wheelchair)  and numbness (neuropathy/paresthesias of the lower extremities). Negative for dizziness, headaches, light-headedness, seizures and speech difficulty.  Hematological: Negative.   Psychiatric/Behavioral: Negative.  Negative for depression and sleep disturbance. The patient is not nervous/anxious.       VITALS:  Blood pressure (!) 151/69, pulse 79, temperature (!) 97.4 F (36.3 C), temperature source Oral, resp. rate 18, height '5\' 6"'  (1.676 m), weight 159 lb 6.4 oz (72.3 kg), SpO2 97 %.  Wt Readings from Last 3 Encounters:  01/28/22 154 lb 6.4 oz (70 kg)  01/08/22 159 lb 6.4 oz (72.3 kg)  12/22/21 160 lb 12.8 oz (72.9 kg)    Body mass index is 25.73 kg/m.  Performance status (ECOG): 2  PHYSICAL EXAM:  Physical Exam Constitutional:      General: She is not in acute distress.    Appearance: Normal appearance. She is normal weight.  HENT:     Head: Normocephalic and atraumatic.  Eyes:     General: No scleral icterus.    Extraocular Movements: Extraocular movements intact.     Conjunctiva/sclera: Conjunctivae normal.     Pupils: Pupils are equal, round, and reactive to light.  Cardiovascular:     Rate and Rhythm: Normal rate and regular rhythm.     Pulses: Normal pulses.     Heart sounds: Normal heart sounds. No murmur heard.    No friction rub. No gallop.  Pulmonary:     Effort: Pulmonary effort is normal. No respiratory distress.     Breath sounds: Normal breath sounds.  Abdominal:     General: Bowel sounds are normal. There is no distension.     Palpations: Abdomen is soft. There is no hepatomegaly, splenomegaly or mass.     Tenderness: There is no abdominal tenderness.  Musculoskeletal:        General: Normal range of motion.     Cervical back: Normal range of motion and neck supple.     Right lower leg: No edema.     Left lower leg: No edema.  Lymphadenopathy:     Cervical: No cervical adenopathy.  Skin:    General: Skin is warm and dry.  Neurological:     General:  No focal deficit present.     Mental Status: She is alert and oriented to person, place, and time. Mental status is at baseline.  Psychiatric:        Mood and Affect: Mood normal.        Behavior: Behavior normal.        Thought Content: Thought content normal.        Judgment: Judgment normal.      LABS:      Latest Ref Rng & Units 01/28/2022   12:00 AM 01/08/2022   12:00 AM 12/10/2021   12:00 AM  CBC  WBC  10.6     11.6     12.3      Hemoglobin 12.0 - 16.0 12.7     12.3     13.2      Hematocrit 36 - 46 38     36     41      Platelets 150 - 400 K/uL 613     515     538         This result is from an external source.      Latest Ref Rng & Units 01/28/2022   12:00 AM 01/08/2022   12:00 AM  12/10/2021   12:00 AM  CMP  BUN 4 - '21 17     22     21      ' Creatinine 0.5 - 1.1 0.7     0.6     0.7      Sodium 137 - 147 132     133     134      Potassium 3.5 - 5.1 mEq/L 3.7     4.3     3.9      Chloride 99 - 108 98     101     97      CO2 13 - '22 26     26     27      ' Calcium 8.7 - 10.7 10.0     10.1     10.7      Alkaline Phos 25 - 125 338     345     367      AST 13 - 35 81     113     85      ALT 7 - 35 U/L '24     19     21         ' This result is from an external source.     Lab Results  Component Value Date   CEA1 8.3 (H) 02/19/2021   /  CEA  Date Value Ref Range Status  02/19/2021 8.3 (H) 0.0 - 4.7 ng/mL Final    Comment:    (NOTE)                             Nonsmokers          <3.9                             Smokers             <5.6 Roche [employer] Electrochemiluminescence Immunoassay (ECLIA) Values obtained with different assay methods or kits cannot be used interchangeably.  Results cannot be interpreted as absolute evidence of the presence or absence of malignant disease. Performed At: [facility] [place], [place] [id number] Rush Farmer MD RZ:[id number]     Lab Results  Component Value Date   VUD314 23.7 07/22/2020       STUDIES:  No results found.

## 2022-01-07 ENCOUNTER — Ambulatory Visit: Payer: Medicare Other | Admitting: Oncology

## 2022-01-07 ENCOUNTER — Other Ambulatory Visit: Payer: Medicare Other

## 2022-01-08 ENCOUNTER — Inpatient Hospital Stay: Payer: Medicare Other

## 2022-01-08 ENCOUNTER — Encounter: Payer: Self-pay | Admitting: Oncology

## 2022-01-08 ENCOUNTER — Inpatient Hospital Stay (INDEPENDENT_AMBULATORY_CARE_PROVIDER_SITE_OTHER): Payer: Medicare Other | Admitting: Oncology

## 2022-01-08 VITALS — BP 151/69 | HR 79 | Temp 97.4°F | Resp 18 | Ht 66.0 in | Wt 159.4 lb

## 2022-01-08 DIAGNOSIS — C7951 Secondary malignant neoplasm of bone: Secondary | ICD-10-CM

## 2022-01-08 DIAGNOSIS — Z170 Estrogen receptor positive status [ER+]: Secondary | ICD-10-CM

## 2022-01-08 DIAGNOSIS — C50212 Malignant neoplasm of upper-inner quadrant of left female breast: Secondary | ICD-10-CM

## 2022-01-08 DIAGNOSIS — Z5111 Encounter for antineoplastic chemotherapy: Secondary | ICD-10-CM | POA: Diagnosis not present

## 2022-01-08 DIAGNOSIS — N390 Urinary tract infection, site not specified: Secondary | ICD-10-CM

## 2022-01-08 LAB — CBC: RBC: 4.1 (ref 3.87–5.11)

## 2022-01-08 LAB — COMPREHENSIVE METABOLIC PANEL
Albumin: 3.9 (ref 3.5–5.0)
Calcium: 10.1 (ref 8.7–10.7)

## 2022-01-08 LAB — CBC AND DIFFERENTIAL
HCT: 36 (ref 36–46)
Hemoglobin: 12.3 (ref 12.0–16.0)
Neutrophils Absolute: 6.5
Platelets: 515 10*3/uL — AB (ref 150–400)
WBC: 11.6

## 2022-01-08 LAB — HEPATIC FUNCTION PANEL
ALT: 19 U/L (ref 7–35)
AST: 113 — AB (ref 13–35)
Alkaline Phosphatase: 345 — AB (ref 25–125)
Bilirubin, Total: 0.5

## 2022-01-08 LAB — BASIC METABOLIC PANEL
BUN: 22 — AB (ref 4–21)
CO2: 26 — AB (ref 13–22)
Chloride: 101 (ref 99–108)
Creatinine: 0.6 (ref 0.5–1.1)
Glucose: 111
Potassium: 4.3 mEq/L (ref 3.5–5.1)
Sodium: 133 — AB (ref 137–147)

## 2022-01-08 MED FILL — Trastuzumab-Hyaluronidase-oysk Inj 600-10000 MG-Unit/5ML: SUBCUTANEOUS | Qty: 5 | Status: AC

## 2022-01-09 ENCOUNTER — Inpatient Hospital Stay: Payer: Medicare Other

## 2022-01-09 ENCOUNTER — Encounter: Payer: Self-pay | Admitting: Oncology

## 2022-01-09 VITALS — BP 118/50 | HR 77 | Temp 98.3°F | Resp 18

## 2022-01-09 DIAGNOSIS — C50212 Malignant neoplasm of upper-inner quadrant of left female breast: Secondary | ICD-10-CM

## 2022-01-09 DIAGNOSIS — Z5111 Encounter for antineoplastic chemotherapy: Secondary | ICD-10-CM | POA: Diagnosis not present

## 2022-01-09 DIAGNOSIS — C7951 Secondary malignant neoplasm of bone: Secondary | ICD-10-CM

## 2022-01-09 LAB — CANCER ANTIGEN 27.29: CA 27.29: 1145.4 U/mL — ABNORMAL HIGH (ref 0.0–38.6)

## 2022-01-09 MED ORDER — TRASTUZUMAB-HYALURONIDASE-OYSK 600-10000 MG-UNT/5ML ~~LOC~~ SOLN
600.0000 mg | Freq: Once | SUBCUTANEOUS | Status: AC
  Administered 2022-01-09: 600 mg via SUBCUTANEOUS
  Filled 2022-01-09: qty 5

## 2022-01-09 MED ORDER — ACETAMINOPHEN 325 MG PO TABS: 650.0000 mg | ORAL_TABLET | Freq: Once | ORAL | Status: DC

## 2022-01-09 MED ORDER — LORATADINE 10 MG PO TABS: 10.0000 mg | ORAL_TABLET | Freq: Once | ORAL | Status: DC

## 2022-01-09 NOTE — Patient Instructions (Signed)
Trastuzumab injection for infusion ?What is this medication? ?TRASTUZUMAB (tras TOO zoo mab) is a monoclonal antibody. It is used to treat breast cancer and stomach cancer. ?This medicine may be used for other purposes; ask your health care provider or pharmacist if you have questions. ?COMMON BRAND NAME(S): Herceptin, Herzuma, KANJINTI, Ogivri, Ontruzant, Trazimera ?What should I tell my care team before I take this medication? ?They need to know if you have any of these conditions: ?heart disease ?heart failure ?lung or breathing disease, like asthma ?an unusual or allergic reaction to trastuzumab, benzyl alcohol, or other medications, foods, dyes, or preservatives ?pregnant or trying to get pregnant ?breast-feeding ?How should I use this medication? ?This drug is given as an infusion into a vein. It is administered in a hospital or clinic by a specially trained health care professional. ?Talk to your pediatrician regarding the use of this medicine in children. This medicine is not approved for use in children. ?Overdosage: If you think you have taken too much of this medicine contact a poison control center or emergency room at once. ?NOTE: This medicine is only for you. Do not share this medicine with others. ?What if I miss a dose? ?It is important not to miss a dose. Call your doctor or health care professional if you are unable to keep an appointment. ?What may interact with this medication? ?This medicine may interact with the following medications: ?certain types of chemotherapy, such as daunorubicin, doxorubicin, epirubicin, and idarubicin ?This list may not describe all possible interactions. Give your health care provider a list of all the medicines, herbs, non-prescription drugs, or dietary supplements you use. Also tell them if you smoke, drink alcohol, or use illegal drugs. Some items may interact with your medicine. ?What should I watch for while using this medication? ?Visit your doctor for checks  on your progress. Report any side effects. Continue your course of treatment even though you feel ill unless your doctor tells you to stop. ?Call your doctor or health care professional for advice if you get a fever, chills or sore throat, or other symptoms of a cold or flu. Do not treat yourself. Try to avoid being around people who are sick. ?You may experience fever, chills and shaking during your first infusion. These effects are usually mild and can be treated with other medicines. Report any side effects during the infusion to your health care professional. Fever and chills usually do not happen with later infusions. ?Do not become pregnant while taking this medicine or for 7 months after stopping it. Women should inform their doctor if they wish to become pregnant or think they might be pregnant. Women of child-bearing potential will need to have a negative pregnancy test before starting this medicine. There is a potential for serious side effects to an unborn child. Talk to your health care professional or pharmacist for more information. Do not breast-feed an infant while taking this medicine or for 7 months after stopping it. ?Women must use effective birth control with this medicine. ?What side effects may I notice from receiving this medication? ?Side effects that you should report to your doctor or health care professional as soon as possible: ?allergic reactions like skin rash, itching or hives, swelling of the face, lips, or tongue ?chest pain or palpitations ?cough ?dizziness ?feeling faint or lightheaded, falls ?fever ?general ill feeling or flu-like symptoms ?signs of worsening heart failure like breathing problems; swelling in your legs and feet ?unusually weak or tired ?Side effects that usually   do not require medical attention (report to your doctor or health care professional if they continue or are bothersome): ?bone pain ?changes in taste ?diarrhea ?joint pain ?nausea/vomiting ?weight  loss ?This list may not describe all possible side effects. Call your doctor for medical advice about side effects. You may report side effects to FDA at 1-800-FDA-1088. ?Where should I keep my medication? ?This drug is given in a hospital or clinic and will not be stored at home. ?NOTE: This sheet is a summary. It may not cover all possible information. If you have questions about this medicine, talk to your doctor, pharmacist, or health care provider. ?? 2023 Elsevier/Gold Standard (2016-07-14 00:00:00) ? ?

## 2022-01-22 ENCOUNTER — Other Ambulatory Visit: Payer: Self-pay | Admitting: Pharmacist

## 2022-01-25 ENCOUNTER — Other Ambulatory Visit: Payer: Self-pay | Admitting: Oncology

## 2022-01-27 ENCOUNTER — Other Ambulatory Visit: Payer: Self-pay | Admitting: Hematology and Oncology

## 2022-01-27 MED ORDER — IBUPROFEN 600 MG PO TABS: 600.0000 mg | ORAL_TABLET | ORAL | 2 refills | Status: AC | PRN

## 2022-01-28 ENCOUNTER — Inpatient Hospital Stay: Payer: Medicare Other | Attending: Oncology | Admitting: Oncology

## 2022-01-28 ENCOUNTER — Other Ambulatory Visit: Payer: Self-pay | Admitting: Oncology

## 2022-01-28 ENCOUNTER — Inpatient Hospital Stay: Payer: Medicare Other

## 2022-01-28 ENCOUNTER — Encounter: Payer: Self-pay | Admitting: Oncology

## 2022-01-28 VITALS — BP 119/59 | HR 82 | Temp 98.4°F | Resp 18 | Ht 66.0 in | Wt 154.4 lb

## 2022-01-28 DIAGNOSIS — R978 Other abnormal tumor markers: Secondary | ICD-10-CM | POA: Diagnosis not present

## 2022-01-28 DIAGNOSIS — C50212 Malignant neoplasm of upper-inner quadrant of left female breast: Secondary | ICD-10-CM

## 2022-01-28 DIAGNOSIS — R531 Weakness: Secondary | ICD-10-CM | POA: Diagnosis not present

## 2022-01-28 DIAGNOSIS — Z79810 Long term (current) use of selective estrogen receptor modulators (SERMs): Secondary | ICD-10-CM | POA: Diagnosis not present

## 2022-01-28 DIAGNOSIS — E871 Hypo-osmolality and hyponatremia: Secondary | ICD-10-CM | POA: Diagnosis not present

## 2022-01-28 DIAGNOSIS — Z9012 Acquired absence of left breast and nipple: Secondary | ICD-10-CM | POA: Insufficient documentation

## 2022-01-28 DIAGNOSIS — R410 Disorientation, unspecified: Secondary | ICD-10-CM | POA: Insufficient documentation

## 2022-01-28 DIAGNOSIS — Z9081 Acquired absence of spleen: Secondary | ICD-10-CM | POA: Diagnosis not present

## 2022-01-28 DIAGNOSIS — C778 Secondary and unspecified malignant neoplasm of lymph nodes of multiple regions: Secondary | ICD-10-CM | POA: Insufficient documentation

## 2022-01-28 DIAGNOSIS — Z5111 Encounter for antineoplastic chemotherapy: Secondary | ICD-10-CM | POA: Insufficient documentation

## 2022-01-28 DIAGNOSIS — Z7983 Long term (current) use of bisphosphonates: Secondary | ICD-10-CM | POA: Insufficient documentation

## 2022-01-28 DIAGNOSIS — G629 Polyneuropathy, unspecified: Secondary | ICD-10-CM | POA: Insufficient documentation

## 2022-01-28 DIAGNOSIS — Z170 Estrogen receptor positive status [ER+]: Secondary | ICD-10-CM | POA: Diagnosis not present

## 2022-01-28 DIAGNOSIS — Z79899 Other long term (current) drug therapy: Secondary | ICD-10-CM | POA: Insufficient documentation

## 2022-01-28 DIAGNOSIS — N390 Urinary tract infection, site not specified: Secondary | ICD-10-CM

## 2022-01-28 DIAGNOSIS — Z87440 Personal history of urinary (tract) infections: Secondary | ICD-10-CM | POA: Diagnosis not present

## 2022-01-28 DIAGNOSIS — M810 Age-related osteoporosis without current pathological fracture: Secondary | ICD-10-CM | POA: Diagnosis not present

## 2022-01-28 DIAGNOSIS — C7951 Secondary malignant neoplasm of bone: Secondary | ICD-10-CM | POA: Diagnosis not present

## 2022-01-28 DIAGNOSIS — D75839 Thrombocytosis, unspecified: Secondary | ICD-10-CM | POA: Diagnosis not present

## 2022-01-28 LAB — HEPATIC FUNCTION PANEL
ALT: 24 U/L (ref 7–35)
AST: 81 — AB (ref 13–35)
Alkaline Phosphatase: 338 — AB (ref 25–125)
Bilirubin, Total: 0.6

## 2022-01-28 LAB — CBC AND DIFFERENTIAL
HCT: 38 (ref 36–46)
Hemoglobin: 12.7 (ref 12.0–16.0)
Neutrophils Absolute: 5.94
Platelets: 613 10*3/uL — AB (ref 150–400)
WBC: 10.6

## 2022-01-28 LAB — BASIC METABOLIC PANEL
BUN: 17 (ref 4–21)
CO2: 26 — AB (ref 13–22)
Chloride: 98 — AB (ref 99–108)
Creatinine: 0.7 (ref 0.5–1.1)
Glucose: 112
Potassium: 3.7 mEq/L (ref 3.5–5.1)
Sodium: 132 — AB (ref 137–147)

## 2022-01-28 LAB — COMPREHENSIVE METABOLIC PANEL
Albumin: 3.8 (ref 3.5–5.0)
Calcium: 10 (ref 8.7–10.7)

## 2022-01-28 LAB — CBC: RBC: 4.33 (ref 3.87–5.11)

## 2022-01-28 NOTE — Progress Notes (Signed)
[name]  [address] [place],  [place]  [address] [phone]  Clinic Day:  01/28/22  Referring physician: Raina Mina., MD   ASSESSMENT & PLAN:   1.  Stage IA triple positive left breast cancer, diagnosed in May 2013.  She was treated with left mastectomy in June.  She was started on tamoxifen 20 mg daily in July 2013, but this was discontinued in July 2017 due to concerns regarding endometrial hyperplasia.  She completed one year of therapy with trastuzumab in September 2014.     2.  Recurrent breast cancer, February 2022, with evidence of left axillary and regional lymph nodes as well as bone lesions.  She was subsequently placed on anastrozole which she started on Febuary 28th.  Trastuzumab every 3 weeks was recommended and started on March 18th 2022. Anastrozole was discontinued due to possible mental status toxicities, which in retrospect were more likely from the Mybetriq. She had been switched to fulvestrant injections since September.  3.  Significant elevation of the CA 27-29 which fluctuates, but was over 300 by October and 403 by November. It started to come down, from 368 to 344 in early January. However, by late January, it had significantly increased to 706.2.  However, it has now increased to 889 and we switched to oral letrozole on May 1, instead of the fulvestrant injections. If that is ineffective, another option would be to add in a low dose of palbociclib and I discussed the potential toxicities.  We could also target the HER2 with additional medication with agents such as pertuzumab.  They are not interested in pursuing aggressive IV chemotherapy.  4.  Lower extremity weakness and episodes of confusion, being evaluated by neurology and felt to be neuropathy.  She is currently on Mestinon 0.5 mg TID. She declines re-evaluation by physical therapy at this time.   5.  Osteoporosis of the forearm.  She received denosumab on March 3rd,  but did not tolerate this therapy due to jaw pain, weakness and fatigue, and so it was discontinued.  We decided to place her on alendronate 70 mg weekly and she is tolerating this well.  She is no longer on oral calcium as she has chronic mild hypercalcemia.   6.  Status post splenectomy.  She has started vaccines and had a severe reaction with the meningitis vaccines.  We will not pursue further meningitis vaccines.  7.  Hyponatremia.  This has been chronic and generally stable.  I advised that she include fluids such as Gatorade or Pedialyte.   8.  Thrombocytosis.  I feel this is reactive and it is worse today at 613,000.  We will continue to monitor.  Her CA 27.29 was up to 889 in April, so we switched her therapy to oral letrozole as of May 1. It went up to 923.8 after that and then 1167.7, but did come down to 1145 in June. For now, she will proceed with a 24th cycle of trastuzumab this week.  She complains of increase in symptoms relating to her bone metastases, and complains of extreme weakness.  I think a lot of her leg pain is more neuropathy, and the patient has Tylenol 3.  We will check her urine since she has had recurrent urine infections.  I tried a low dose of Cipro instead but she had side effects and so it has been stopped.  She knows to continue alendronate 70 mg weekly. Otherwise, we will plan to see  her back in 4 weeks with CBC, CMP and CA 27.29 prior to her 25th cycle of trastuzumab.  Her last echocardiogram was October 30, 2021 at [facility] and was good, with an EF of 60-65%, so she is due again now.  Both she and her daughter verbalize understanding of and agreement to the plans discussed today. They know to call the office should any new questions or concerns arise.   I provided 20 minutes of face-to-face time during this this encounter and > 50% was spent counseling as documented under my assessment and plan.    [name], MD [facility] [address] [place] [address] Dept: [phone] Dept Fax: [phone]    CHIEF COMPLAINT:  CC: History of recurrent breast cancer  Current Treatment:  Trastuzumab every 3 weeks, alendronate and letrozole   HISTORY OF PRESENT ILLNESS:  Amy Kline is a 88 y.o. female who presented as a transfer of care due to recurrent breast cancer. She was originally diagnosed with a stage IA left breast cancer in May 2013 when biopsy confirmed invasive ductal carcinoma, grade 2.  Estrogen and progesterone receptors were positive at 100% and 96%, and HER2 was negative.  She was treated with left mastectomy in June and repeat HER2 was positive by [facility].  She followed with Dr. Lurline Del, and was started on tamoxifen 20 mg daily in July 2013, but this was discontinued in July 2017 due to concerns regarding endometrial hyperplasia.  She completed one year of therapy with trastuzumab in September 2014.  She did have some cardiotoxicity with this therapy, and so this had to be held this for 5-6 weeks.   However, in the summer of 2021 she started having trouble walking with generalized lower extremity weakness and lower back pain.  She also was having episodes of syncope.  MRI brain from August was negative, but MRI lumbar spine from December revealed STIR hyperintense foci at T12-L1 and S1.  MRI pelvis showed similar areas, and in addition, numerous enhancing marrow replacing lesions in the pelvis.  Bone marrow biopsy from January 2022 showed no evidence of carcinoma and a normocellular marrow.  CT chest, abdomen and pelvis showed increased left axillary and subpectoral lymphadenopathy but no other evidence of metastatic disease.  She underwent left axillary lymph node biopsy on February 18th which confirmed triple positive carcinoma involving fatty tissue.  She was subsequently placed on anastrozole which she started on Febuary 28th, and denosumab was  initiated on March 3rd, but she did not tolerate this, so it was stopped.  Trastuzumab was recommended, and initiated on March 18th.  ECHO from September 2021 revealed an ejection fraction between 60-65%.  Tumor markers from January revealed a CA 27-29 of 87, and CEA of 6.65.  Recent CA 27-29 from March is significantly elevated at 221, and CEA was 10.12.  Staging PET imaging from March confirmed metastatic breast cancer as evidenced by hypermetabolic left subpectoral/left axillary lymph nodes and widespread hypermetabolic osseous lesions.  There is mild hypermetabolism within an nonenlarged subcarinal lymph node and both hilar regions.  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  She is status post splenectomy, and has had both Prevnar 13 and pneumovax within the last few years. Bone density scan from March revealed osteoporosis with a T-score of -3.3 of the forearm, and right femur neck measures -2.0, considered osteopenic.  She did  have her meningitis vaccine and experienced severe soreness which is resolving. ECHO from June 30th revealed an EF between 55-60%.  She was placed on alendronate 70 mg weekly.  We stopped the anastrozole and switched her to fulvestrant injections with the 1st one administered September 2nd. New baseline CA 27.29 from September was 299.9, up from 261, and has steadily worsened. She had some worsening mental status changes which seemed to improve when the Vesicare was stopped. There may have been other factors involved such as mild hypercalcemia (up over 11.0), hormonal therapy, and her previous reaction to the meningitis vaccine.  INTERVAL HISTORY:  I have reviewed her chart and materials related to her cancer extensively and collaborated history with the patient. Summary of oncologic history is as follows: Oncology History  Breast cancer of upper-inner quadrant of left female breast ([place])  12/02/2011 Cancer Staging   Staging form: Breast,  AJCC 7th Edition - Pathologic: Stage IV (TX, NX, M1) - Signed by Chauncey Cruel, MD on 09/02/2020 Specimen type: Core Needle Biopsy Histopathologic type: Infiltrating duct carcinoma, NOS Laterality: Left   05/30/2013 Initial Diagnosis   Breast cancer of upper-inner quadrant of left female breast ([place])   09/27/2020 -  Chemotherapy   Patient is on Treatment Plan : BREAST Trastuzumab SubQ q21d     Metastasis to bone ([place])  07/03/2020 Initial Diagnosis   Metastasis to bone ([place])   09/27/2020 -  Chemotherapy   Patient is on Treatment Plan : BREAST Trastuzumab SubQ q21d      Junior is here for routine follow up prior to a 24th cycle of trastuzumab. She states that she is weak and has had some minor falls with no serious injuries.  I think she is declining in general and might benefit from further physical therapy.  She is not eating well and we had a discussion about this.  She has severe fatigue and lack of energy. Her last CTA in April was negative for pulmonary embolism and just showed mild pulmonary fibrosis and similar appearance of the enlarged metastatic left axillary and subpectoral lymph nodes, as well as the sclerotic osseous metastatic lesions.  She is using ibuprofen 600 mg as needed for pain, and occasional Tylenol 3.  She does have pain and paresthesias of the lower extremities, consistent with neuropathy, which occasionally wakes her up during the night. Platelets have increased again to 613,000 and white count and hemoglobin are normal. Chemistries are unremarkable except for an alkaline phosphatase of 338 and her SGOT is mildly elevated at 81.  Her sodium remains mildly low at 132.  We have stopped the fulvestrant injections.  We switched her to oral letrozole in May since her cancer marker continues to rise dramatically. The latest one went from 889 to 923.8 in April, and then to 1167.7, but the last one had decreased slightly to 1145.  Her  appetite is poor, and her weight has  decreased 5-1/2 pounds since her last visit.  Her last echocardiogram was on April 20 at [facility], and looked good, with an EF of 60-65%, so she is due for repeat and I will get that scheduled now.  She denies fever, chills or other signs of infection.  She denies nausea, vomiting, bowel issues, or abdominal pain.  She denies sore throat, cough, dyspnea, or chest pain.  They request that we recheck a urine since she has had so many recurrent urinary tract infections.  I will therefore get a urinalysis and urine culture if she is  able to give [place] a specimen.  She did not tolerate the low-dose of Cipro 250 mg that we were using as a suppressive antibiotic so this has been stopped. HISTORY:   Allergies:  Allergies  Allergen Reactions   Neosporin [Neomycin-Polymyxin-Gramicidin] Rash   Sulfa Antibiotics Hives    itching   Bacitra-Neomycin-Polymyxin-Hc    Bacitracin-Polymyxin B    Bexsero [Meningococcal Grp B (Recomb) Vaccine] Other (See Comments)    Pt reported severe arthralgias and fatigue after 1st dose of bexsero and wishes to not receive again.   Cefdinir Nausea And Vomiting   Denosumab     Pain in jaw/head/mouth numb/weak   Other Other (See Comments)    Polysporin causes a rash and swelling Unknown   Statins    Macrodantin [Nitrofurantoin] Itching and Rash    Pt's daughter,Amy Kline, called to state that the last time Dr Bea Graff put her on this med, she had a lot of itching and redness in areas.    Current Medications: Current Outpatient Medications  Medication Sig Dispense Refill   acetaminophen-codeine (TYLENOL #3) 300-30 MG tablet Take 1 tablet by mouth every 6 (six) hours as needed for moderate pain. 90 tablet 0   alendronate (FOSAMAX) 70 MG tablet TAKE 1 TABLET BY MOUTH  WEEKLY WITH 8 OZ OF PLAIN  WATER 30 MINUTES BEFORE  FIRST FOOD, DRINK OR MEDS.  STAY UPRIGHT FOR 30 MINS 12 tablet 3   amLODipine (NORVASC) 2.5 MG tablet TAKE 1 TABLET BY MOUTH DAILY 90 tablet 3   aspirin 81 MG  tablet Take 81 mg by mouth daily.     carvedilol (COREG) 12.5 MG tablet Take 1 tablet (12.5 mg total) by mouth 2 (two) times daily with a meal. 180 tablet 3   cyanocobalamin 1000 MCG tablet Take by mouth.     EPINEPHrine (EPIPEN 2-PAK) 0.3 mg/0.3 mL IJ SOAJ injection Inject 0.3 mg into the muscle as needed for anaphylaxis. 1 each 1   ergocalciferol (VITAMIN D2) 1.25 MG (50000 UT) capsule Take 1 capsule (50,000 Units total) by mouth once a week. 60 capsule 0   fluconazole (DIFLUCAN) 150 MG tablet TAKE 1 TABLET(150 MG) BY MOUTH 1 TIME FOR 1 DOSE 1 tablet 0   furosemide (LASIX) 20 MG tablet Take 1 tablet (20 mg total) by mouth daily. 90 tablet 3   ibuprofen (ADVIL) 600 MG tablet Take 1 tablet (600 mg total) by mouth as needed for headache. 30 tablet 2   lactose free nutrition (BOOST) LIQD Take 237 mLs by mouth as needed (other). for no appetite     lamoTRIgine (LAMICTAL) 25 MG tablet Take 1 tablet (25 mg total) by mouth 2 (two) times daily. 60 tablet 5   letrozole (FEMARA) 2.5 MG tablet Take 1 tablet (2.5 mg total) by mouth daily. 90 tablet 3   loratadine (CLARITIN) 10 MG tablet Take 10 mg by mouth daily.     omeprazole (PRILOSEC) 20 MG capsule Take 20 mg by mouth at bedtime.      pyridostigmine (MESTINON) 60 MG tablet Take by mouth.     SAVELLA 50 MG TABS tablet Take 50 mg by mouth 2 (two) times daily.     telmisartan-hydrochlorothiazide (MICARDIS HCT) 40-12.5 MG tablet Take 0.5 tablets by mouth daily. 45 tablet 1   traMADol (ULTRAM) 50 MG tablet Take 1 tablet (50 mg total) by mouth every 6 (six) hours as needed. 60 tablet 0   No current facility-administered medications for this visit.    REVIEW OF SYSTEMS:  Review of Systems  Constitutional:  Negative for appetite change, chills, fatigue, fever and unexpected weight change.  HENT:  Negative.    Eyes: Negative.   Respiratory: Negative.  Negative for chest tightness, cough, hemoptysis, shortness of breath and wheezing.   Cardiovascular:  Negative.  Negative for chest pain, leg swelling and palpitations.  Gastrointestinal:  Negative for abdominal distention, abdominal pain, blood in stool, constipation, diarrhea, nausea and vomiting.  Endocrine: Negative.   Genitourinary:  Negative for difficulty urinating, dysuria, frequency and hematuria.   Musculoskeletal:  Positive for back pain (of the lower back, chronic) and gait problem (in a wheelchair). Negative for arthralgias, flank pain and myalgias.  Skin: Negative.   Neurological:  Positive for extremity weakness, gait problem (in a wheelchair) and numbness (neuropathy/paresthesias of the lower extremities). Negative for dizziness, headaches, light-headedness, seizures and speech difficulty.  Hematological: Negative.   Psychiatric/Behavioral: Negative.  Negative for depression and sleep disturbance. The patient is not nervous/anxious.       VITALS:  Blood pressure (!) 119/59, pulse 82, temperature 98.4 F (36.9 C), temperature source Tympanic, resp. rate 18, height '5\' 6"'  (1.676 m), weight 154 lb 6.4 oz (70 kg), SpO2 96 %.  Wt Readings from Last 3 Encounters:  01/28/22 154 lb 6.4 oz (70 kg)  01/08/22 159 lb 6.4 oz (72.3 kg)  12/22/21 160 lb 12.8 oz (72.9 kg)    Body mass index is 24.92 kg/m.  Performance status (ECOG): 2  PHYSICAL EXAM:  Physical Exam Constitutional:      General: She is not in acute distress.    Appearance: Normal appearance. She is normal weight.  HENT:     Head: Normocephalic and atraumatic.  Eyes:     General: No scleral icterus.    Extraocular Movements: Extraocular movements intact.     Conjunctiva/sclera: Conjunctivae normal.     Pupils: Pupils are equal, round, and reactive to light.  Cardiovascular:     Rate and Rhythm: Normal rate and regular rhythm.     Pulses: Normal pulses.     Heart sounds: Normal heart sounds. No murmur heard.    No friction rub. No gallop.  Pulmonary:     Effort: Pulmonary effort is normal. No respiratory  distress.     Breath sounds: Normal breath sounds.  Abdominal:     General: Bowel sounds are normal. There is no distension.     Palpations: Abdomen is soft. There is no hepatomegaly, splenomegaly or mass.     Tenderness: There is no abdominal tenderness.  Musculoskeletal:        General: Normal range of motion.     Cervical back: Normal range of motion and neck supple.     Right lower leg: No edema.     Left lower leg: No edema.  Lymphadenopathy:     Cervical: No cervical adenopathy.  Skin:    General: Skin is warm and dry.  Neurological:     General: No focal deficit present.     Mental Status: She is alert and oriented to person, place, and time. Mental status is at baseline.  Psychiatric:        Mood and Affect: Mood normal.        Behavior: Behavior normal.        Thought Content: Thought content normal.        Judgment: Judgment normal.      LABS:      Latest Ref Rng & Units 01/28/2022   12:00 AM 01/08/2022  12:00 AM 12/10/2021   12:00 AM  CBC  WBC  10.6     11.6     12.3      Hemoglobin 12.0 - 16.0 12.7     12.3     13.2      Hematocrit 36 - 46 38     36     41      Platelets 150 - 400 K/uL 613     515     538         This result is from an external source.      Latest Ref Rng & Units 01/28/2022   12:00 AM 01/08/2022   12:00 AM 12/10/2021   12:00 AM  CMP  BUN 4 - '21 17     22     21      ' Creatinine 0.5 - 1.1 0.7     0.6     0.7      Sodium 137 - 147 132     133     134      Potassium 3.5 - 5.1 mEq/L 3.7     4.3     3.9      Chloride 99 - 108 98     101     97      CO2 13 - '22 26     26     27      ' Calcium 8.7 - 10.7 10.0     10.1     10.7      Alkaline Phos 25 - 125 338     345     367      AST 13 - 35 81     113     85      ALT 7 - 35 U/L '24     19     21         ' This result is from an external source.     Lab Results  Component Value Date   CEA1 8.3 (H) 02/19/2021   /  CEA  Date Value Ref Range Status  02/19/2021 8.3 (H) 0.0 - 4.7 ng/mL Final     Comment:    (NOTE)                             Nonsmokers          <3.9                             Smokers             <5.6 Roche [employer]ostics Electrochemiluminescence Immunoassay (ECLIA) Values obtained with different assay methods or kits cannot be used interchangeably.  Results cannot be interpreted as absolute evidence of the presence or absence of malignant disease. Performed At: [facility] [place], [place] [id number] Rush Farmer MD [id number]     Lab Results  Component Value Date   DUK025 23.7 07/22/2020      STUDIES:  No results found.

## 2022-01-29 ENCOUNTER — Inpatient Hospital Stay: Payer: Medicare Other

## 2022-01-29 ENCOUNTER — Other Ambulatory Visit: Payer: Self-pay | Admitting: Oncology

## 2022-01-29 DIAGNOSIS — Z5111 Encounter for antineoplastic chemotherapy: Secondary | ICD-10-CM | POA: Diagnosis not present

## 2022-01-29 DIAGNOSIS — C50212 Malignant neoplasm of upper-inner quadrant of left female breast: Secondary | ICD-10-CM

## 2022-01-29 DIAGNOSIS — Z0189 Encounter for other specified special examinations: Secondary | ICD-10-CM

## 2022-01-29 LAB — URINALYSIS, COMPLETE (UACMP) WITH MICROSCOPIC
Bacteria, UA: NONE SEEN
Bilirubin Urine: NEGATIVE
Glucose, UA: NEGATIVE mg/dL
Hgb urine dipstick: NEGATIVE
Ketones, ur: NEGATIVE mg/dL
Leukocytes,Ua: NEGATIVE
Nitrite: NEGATIVE
Protein, ur: NEGATIVE mg/dL
Specific Gravity, Urine: 1.017 (ref 1.005–1.030)
pH: 5 (ref 5.0–8.0)

## 2022-01-29 LAB — CANCER ANTIGEN 27.29: CA 27.29: 1253.4 U/mL — ABNORMAL HIGH (ref 0.0–38.6)

## 2022-01-29 MED FILL — Trastuzumab-Hyaluronidase-oysk Inj 600-10000 MG-Unit/5ML: SUBCUTANEOUS | Qty: 5 | Status: AC

## 2022-01-30 ENCOUNTER — Telehealth: Payer: Self-pay

## 2022-01-30 ENCOUNTER — Inpatient Hospital Stay: Payer: Medicare Other

## 2022-01-30 LAB — URINE CULTURE: Culture: NO GROWTH

## 2022-01-30 NOTE — Telephone Encounter (Signed)
Amy Kline patients daughter called for upcomming appt after todays appt was missed. Spoke with Dwyane Luo and appt is for Monday at 12noon, [employer] notified patient . And now Amy Kline is aware.

## 2022-02-02 ENCOUNTER — Other Ambulatory Visit: Payer: Self-pay

## 2022-02-02 ENCOUNTER — Inpatient Hospital Stay: Payer: Medicare Other

## 2022-02-02 ENCOUNTER — Ambulatory Visit: Payer: Medicare Other

## 2022-02-02 ENCOUNTER — Telehealth: Payer: Self-pay

## 2022-02-02 VITALS — BP 132/66 | HR 87 | Temp 98.4°F | Resp 16

## 2022-02-02 DIAGNOSIS — C50212 Malignant neoplasm of upper-inner quadrant of left female breast: Secondary | ICD-10-CM

## 2022-02-02 DIAGNOSIS — C7951 Secondary malignant neoplasm of bone: Secondary | ICD-10-CM

## 2022-02-02 DIAGNOSIS — Z5111 Encounter for antineoplastic chemotherapy: Secondary | ICD-10-CM | POA: Diagnosis not present

## 2022-02-02 MED ORDER — TRASTUZUMAB-HYALURONIDASE-OYSK 600-10000 MG-UNT/5ML ~~LOC~~ SOLN
600.0000 mg | Freq: Once | SUBCUTANEOUS | Status: AC
  Administered 2022-02-02: 600 mg via SUBCUTANEOUS
  Filled 2022-02-02 (×2): qty 5

## 2022-02-02 MED ORDER — ACETAMINOPHEN 325 MG PO TABS: 650.0000 mg | ORAL_TABLET | Freq: Once | ORAL | Status: DC

## 2022-02-02 MED ORDER — LORATADINE 10 MG PO TABS: 10.0000 mg | ORAL_TABLET | Freq: Once | ORAL | Status: DC

## 2022-02-02 NOTE — Patient Instructions (Signed)
Trastuzumab; Hyaluronidase injection What is this medication? TRASTUZUMAB; HYALURONIDASE (tras TOO zoo mab / hye al ur ON i dase) is used to treat breast cancer and stomach cancer. Trastuzumab is a monoclonal antibody. Hyaluronidase is used to improve the effects of trastuzumab. This medicine may be used for other purposes; ask your health care provider or pharmacist if you have questions. COMMON BRAND NAME(S): HERCEPTIN HYLECTA What should I tell my care team before I take this medication? They need to know if you have any of these conditions: heart disease heart failure lung or breathing disease, like asthma an unusual or allergic reaction to trastuzumab, or other medications, foods, dyes, or preservatives pregnant or trying to get pregnant breast-feeding How should I use this medication? This medicine is for injection under the skin. It is given by a health care professional in a hospital or clinic setting. Talk to your pediatrician regarding the use of this medicine in children. This medicine is not approved for use in children. Overdosage: If you think you have taken too much of this medicine contact a poison control center or emergency room at once. NOTE: This medicine is only for you. Do not share this medicine with others. What if I miss a dose? It is important not to miss a dose. Call your doctor or health care professional if you are unable to keep an appointment. What may interact with this medication? This medicine may interact with the following medications: certain types of chemotherapy, such as daunorubicin, doxorubicin, epirubicin, and idarubicin This list may not describe all possible interactions. Give your health care provider a list of all the medicines, herbs, non-prescription drugs, or dietary supplements you use. Also tell them if you smoke, drink alcohol, or use illegal drugs. Some items may interact with your medicine. What should I watch for while using this  medication? Visit your doctor for checks on your progress. Report any side effects. Continue your course of treatment even though you feel ill unless your doctor tells you to stop. Call your doctor or health care professional for advice if you get a fever, chills or sore throat, or other symptoms of a cold or flu. Do not treat yourself. Try to avoid being around people who are sick. You may experience fever, chills and shaking during your first infusion. These effects are usually mild and can be treated with other medicines. Report any side effects during the infusion to your health care professional. Fever and chills usually do not happen with later infusions. Do not become pregnant while taking this medicine or for 7 months after stopping it. Women should inform their doctor if they wish to become pregnant or think they might be pregnant. Women of child-bearing potential will need to have a negative pregnancy test before starting this medicine. There is a potential for serious side effects to an unborn child. Talk to your health care professional or pharmacist for more information. Do not breast-feed an infant while taking this medicine or for 7 months after stopping it. What side effects may I notice from receiving this medication? Side effects that you should report to your doctor or health care professional as soon as possible: allergic reactions like skin rash, itching or hives, swelling of the face, lips, or tongue breathing problems chest pain or palpitations cough fever general ill feeling or flu-like symptoms signs of worsening heart failure like breathing problems; swelling in your legs and feet Side effects that usually do not require medical attention (report these to your doctor  or health care professional if they continue or are bothersome): bone pain changes in taste diarrhea joint pain nausea/vomiting unusually weak or tired weight loss This list may not describe all possible  side effects. Call your doctor for medical advice about side effects. You may report side effects to FDA at 1-800-FDA-1088. Where should I keep my medication? This drug is given in a hospital or clinic and will not be stored at home. NOTE: This sheet is a summary. It may not cover all possible information. If you have questions about this medicine, talk to your doctor, pharmacist, or health care provider.  2023 Elsevier/Gold Standard (2017-11-15 00:00:00)

## 2022-02-02 NOTE — Telephone Encounter (Signed)
Contacted patient's daughter to determine if patient felt well enough for appt today- Daughter reported that patient has had increased difficulty in ambulation. Requested to meet with social worker regarding increased assistance at home- Mort Sawyers notified via quick chat in [place].

## 2022-02-03 NOTE — Progress Notes (Signed)
Spoke with patients daughter [name], patient is having a hard time walking. They were asking about Home Health and equipment to help assist. Dr. [name] is going to write for a hospital bed and [facility] lift. Levada Dy will make referral to [facility].

## 2022-02-04 ENCOUNTER — Encounter: Payer: Self-pay | Admitting: Oncology

## 2022-02-05 ENCOUNTER — Ambulatory Visit (INDEPENDENT_AMBULATORY_CARE_PROVIDER_SITE_OTHER): Payer: Medicare Other

## 2022-02-05 ENCOUNTER — Telehealth: Payer: Self-pay

## 2022-02-05 ENCOUNTER — Encounter: Payer: Self-pay | Admitting: Oncology

## 2022-02-05 DIAGNOSIS — Z170 Estrogen receptor positive status [ER+]: Secondary | ICD-10-CM | POA: Diagnosis not present

## 2022-02-05 DIAGNOSIS — C50212 Malignant neoplasm of upper-inner quadrant of left female breast: Secondary | ICD-10-CM | POA: Diagnosis not present

## 2022-02-05 DIAGNOSIS — Z0189 Encounter for other specified special examinations: Secondary | ICD-10-CM

## 2022-02-05 LAB — ECHOCARDIOGRAM COMPLETE
Area-P 1/2: 2.56 cm2
MV M vel: 4.95 m/s
MV Peak grad: 98 mmHg
P 1/2 time: 420 msec
S' Lateral: 2.9 cm

## 2022-02-05 NOTE — Telephone Encounter (Signed)
-----   Message from [name], MD sent at 02/03/2022 12:52 PM EDT ----- Yes, she has had Home health before but rehab optimized and they could not continue.  Let's see if they can re-evaluate for PT. I can write for a hospital bed but what kind of lift are we thinking? Hoyer? ----- Message ----- From: Mort Sawyers Sent: 02/03/2022  10:30 AM EDT To: [name], MD; Belva Chimes, LPN  I spoke with [name], she is saying patient is having a hard time walking and using her legs most days. They want to see if she can get Home Health back or palative care, but its my understanding palative care would not help in the way they are wanting. [name] stated that patient is going to need a hospital bed and a lift. I mentioned [facility] and private pay assistance and she did not seem interested in these options, and states she definitely does not want Hospice. Maybe Home Health would be the best option then, that way they can come in and see what she is needing. What do you think? ----- Message ----- From: Belva Chimes, LPN Sent: 09/24/3886   9:12 AM EDT To: [name], MD; Mort Sawyers  Patients daughter [name] would like to speak with Dr. [name] and Evelena Peat regarding her mother. [name] would like to talk with Evelena Peat regarding options with assistance and also wants to wait on home health referral. States that they think that patients neuropathy has gotten worse. Had trouble moving lower extremities and moving since Friday. Was able to move to the [facility] last night with lots of help.  Called daughter and informed her that Dr. [name] was not in the office today.

## 2022-02-05 NOTE — Telephone Encounter (Signed)
Referral sent to [facility] home health. Also Evelena Peat was asking about the script for a Harrel Lemon lift or wait until home health sees patient.

## 2022-02-10 ENCOUNTER — Other Ambulatory Visit: Payer: Self-pay | Admitting: Cardiology

## 2022-02-18 ENCOUNTER — Inpatient Hospital Stay: Payer: Medicare Other | Attending: Oncology | Admitting: Oncology

## 2022-02-18 ENCOUNTER — Encounter: Payer: Self-pay | Admitting: Oncology

## 2022-02-18 ENCOUNTER — Inpatient Hospital Stay: Payer: Medicare Other

## 2022-02-18 ENCOUNTER — Other Ambulatory Visit: Payer: Self-pay | Admitting: Oncology

## 2022-02-18 VITALS — BP 119/59 | HR 80 | Temp 97.6°F | Resp 18 | Ht 66.0 in | Wt 152.0 lb

## 2022-02-18 DIAGNOSIS — Z9081 Acquired absence of spleen: Secondary | ICD-10-CM | POA: Diagnosis not present

## 2022-02-18 DIAGNOSIS — M810 Age-related osteoporosis without current pathological fracture: Secondary | ICD-10-CM | POA: Insufficient documentation

## 2022-02-18 DIAGNOSIS — G629 Polyneuropathy, unspecified: Secondary | ICD-10-CM | POA: Diagnosis not present

## 2022-02-18 DIAGNOSIS — R978 Other abnormal tumor markers: Secondary | ICD-10-CM | POA: Insufficient documentation

## 2022-02-18 DIAGNOSIS — Z170 Estrogen receptor positive status [ER+]: Secondary | ICD-10-CM

## 2022-02-18 DIAGNOSIS — C50912 Malignant neoplasm of unspecified site of left female breast: Secondary | ICD-10-CM | POA: Diagnosis not present

## 2022-02-18 DIAGNOSIS — D75839 Thrombocytosis, unspecified: Secondary | ICD-10-CM | POA: Diagnosis not present

## 2022-02-18 DIAGNOSIS — C7951 Secondary malignant neoplasm of bone: Secondary | ICD-10-CM | POA: Insufficient documentation

## 2022-02-18 DIAGNOSIS — C50212 Malignant neoplasm of upper-inner quadrant of left female breast: Secondary | ICD-10-CM | POA: Diagnosis not present

## 2022-02-18 DIAGNOSIS — Z79899 Other long term (current) drug therapy: Secondary | ICD-10-CM | POA: Insufficient documentation

## 2022-02-18 DIAGNOSIS — E871 Hypo-osmolality and hyponatremia: Secondary | ICD-10-CM | POA: Insufficient documentation

## 2022-02-18 DIAGNOSIS — N390 Urinary tract infection, site not specified: Secondary | ICD-10-CM

## 2022-02-18 DIAGNOSIS — C778 Secondary and unspecified malignant neoplasm of lymph nodes of multiple regions: Secondary | ICD-10-CM | POA: Insufficient documentation

## 2022-02-18 LAB — COMPREHENSIVE METABOLIC PANEL
Albumin: 3.9 (ref 3.5–5.0)
Calcium: 10.1 (ref 8.7–10.7)

## 2022-02-18 LAB — BASIC METABOLIC PANEL
BUN: 21 (ref 4–21)
CO2: 27 — AB (ref 13–22)
Chloride: 99 (ref 99–108)
Creatinine: 0.7 (ref 0.5–1.1)
Glucose: 137
Potassium: 3.4 mEq/L — AB (ref 3.5–5.1)
Sodium: 135 — AB (ref 137–147)

## 2022-02-18 LAB — CBC AND DIFFERENTIAL
HCT: 38 (ref 36–46)
Hemoglobin: 12.6 (ref 12.0–16.0)
Neutrophils Absolute: 6.34
Platelets: 517 10*3/uL — AB (ref 150–400)
WBC: 10.4

## 2022-02-18 LAB — CBC: RBC: 4.35 (ref 3.87–5.11)

## 2022-02-18 LAB — HEPATIC FUNCTION PANEL
ALT: 23 U/L (ref 7–35)
AST: 70 — AB (ref 13–35)
Alkaline Phosphatase: 385 — AB (ref 25–125)
Bilirubin, Total: 0.7

## 2022-02-18 NOTE — Progress Notes (Signed)
Amy Kline  [address] [place],    [id number] [phone]  Clinic Day:  02/18/22  Referring physician: Raina Mina., MD   ASSESSMENT & PLAN:   1.  Stage IA triple positive left breast cancer, diagnosed in May 2013.  She was treated with left mastectomy in June.  She was started on tamoxifen 20 mg daily in July 2013, but this was discontinued in July 2017 due to concerns regarding endometrial hyperplasia.  She completed one year of therapy with trastuzumab in September 2014.     2.  Recurrent breast cancer, February 2022, with evidence of left axillary and regional lymph nodes as well as bone lesions.  She was subsequently placed on anastrozole which she started on Febuary 28th.  Trastuzumab every 3 weeks was recommended and started on March 18th 2022. Anastrozole was discontinued due to possible mental status toxicities, which in retrospect were more likely from the Mybetriq. She was switched to fulvestrant injections since September.  3.  Significant elevation of the CA 27-29 which fluctuates, but was over 300 by October and 403 by November. It started to come down, from 368 to 344 in early January. However, by late January, it had significantly increased to 706.2.  However, it has now increased to 889 and we switched to oral letrozole on May 1, instead of the fulvestrant injections. They are not interested in pursuing aggressive IV chemotherapy.  4.  Lower extremity weakness and episodes of confusion, being evaluated by neurology and felt to be neuropathy.  She is currently on Mestinon 0.5 mg TID. She declines re-evaluation by physical therapy at this time.   5.  Osteoporosis of the forearm.  She received denosumab on March 3rd, but did not tolerate this therapy due to jaw pain, weakness and fatigue, and so it was discontinued.  We decided to place her on alendronate 70 mg weekly and she is tolerating this well.  She is no longer on oral calcium as  she has chronic mild hypercalcemia.   6.  Status post splenectomy.  She has started vaccines and had a severe reaction with the meningitis vaccines.  We will not pursue further meningitis vaccines.  7.  Hyponatremia.  This has been chronic and generally stable.  I advised that she include fluids such as Gatorade or Pedialyte.   8.  Thrombocytosis.  I feel this is reactive and it is worse today at 613,000.  We will continue to monitor.  Her CA 27.29 was up to 889 in April, so we switched her therapy to oral letrozole as of May 1. It went up to 923.8 after that and then 1167.7, but did come down to 1145 in June. For now, she will proceed with a 24th cycle of trastuzumab this week.  She complains of increase in symptoms relating to her bone metastases, and complains of extreme weakness.  I think a lot of her leg pain is more neuropathy, and the patient has Tylenol 3.  We will check her urine since she has had recurrent urine infections.  I tried a low dose of Cipro instead but she had side effects and so it has been stopped.  She knows to continue alendronate 70 mg weekly. Otherwise, we will plan to see her back in 4 weeks with CBC, CMP and CA 27.29 prior to her 25th cycle of trastuzumab.  Her last echocardiogram was October 30, 2021 at [facility] and was good, with an EF of 60-65%, so she  is due again now.  Both she and her daughter verbalize understanding of and agreement to the plans discussed today. They know to call the office should any new questions or concerns arise.   I provided 20 minutes of face-to-face time during this this encounter and > 50% was spent counseling as documented under my assessment and plan.    [name], MD [place] [address] [place] [address] Dept: [phone] Dept Fax: [phone]    CHIEF COMPLAINT:  CC: History of recurrent breast cancer  Current Treatment:  Trastuzumab  every 3 weeks, alendronate and letrozole   HISTORY OF PRESENT ILLNESS:  Amy Kline is a 88 y.o. female who presented as a transfer of care due to recurrent breast cancer. She was originally diagnosed with a stage IA left breast cancer in May 2013 when biopsy confirmed invasive ductal carcinoma, grade 2.  Estrogen and progesterone receptors were positive at 100% and 96%, and HER2 was negative.  She was treated with left mastectomy in June and repeat HER2 was positive by [facility].  She followed with Dr. Lurline Del, and was started on tamoxifen 20 mg daily in July 2013, but this was discontinued in July 2017 due to concerns regarding endometrial hyperplasia.  She completed one year of therapy with trastuzumab in September 2014.  She did have some cardiotoxicity with this therapy, and so this had to be held this for 5-6 weeks.   However, in the summer of 2021 she started having trouble walking with generalized lower extremity weakness and lower back pain.  She also was having episodes of syncope.  MRI brain from August was negative, but MRI lumbar spine from December revealed STIR hyperintense foci at T12-L1 and S1.  MRI pelvis showed similar areas, and in addition, numerous enhancing marrow replacing lesions in the pelvis.  Bone marrow biopsy from January 2022 showed no evidence of carcinoma and a normocellular marrow.  CT chest, abdomen and pelvis showed increased left axillary and subpectoral lymphadenopathy but no other evidence of metastatic disease.  She underwent left axillary lymph node biopsy on February 18th which confirmed triple positive carcinoma involving fatty tissue.  She was subsequently placed on anastrozole which she started on Febuary 28th, and denosumab was initiated on March 3rd, but she did not tolerate this, so it was stopped.  Trastuzumab was recommended, and initiated on March 18th.  ECHO from September 2021 revealed an ejection fraction between 60-65%.  Tumor markers from  January revealed a CA 27-29 of 87, and CEA of 6.65.  Recent CA 27-29 from March is significantly elevated at 221, and CEA was 10.12.  Staging PET imaging from March confirmed metastatic breast cancer as evidenced by hypermetabolic left subpectoral/left axillary lymph nodes and widespread hypermetabolic osseous lesions.  There is mild hypermetabolism within an nonenlarged subcarinal lymph node and both hilar regions.  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  She is status post splenectomy, and has had both Prevnar 13 and pneumovax within the last few years. Bone density scan from March revealed osteoporosis with a T-score of -3.3 of the forearm, and right femur neck measures -2.0, considered osteopenic.  She did have her meningitis vaccine and experienced severe soreness which is resolving. ECHO from June 30th revealed an EF between 55-60%.  She was placed on alendronate 70 mg weekly.  We stopped the anastrozole and switched her to fulvestrant  injections with the 1st one administered September 2nd. New baseline CA 27.29 from September was 299.9, up from 261, and has steadily worsened. She had some worsening mental status changes which seemed to improve when the Vesicare was stopped. There may have been other factors involved such as mild hypercalcemia (up over 11.0), hormonal therapy, and her previous reaction to the meningitis vaccine.  INTERVAL HISTORY:  I have reviewed her chart and materials related to her cancer extensively and collaborated history with the patient. Summary of oncologic history is as follows: Oncology History  Breast cancer of upper-inner quadrant of left female breast ([place])  12/02/2011 Cancer Staging   Staging form: Breast, AJCC 7th Edition - Pathologic: Stage IV (TX, NX, M1) - Signed by Chauncey Cruel, MD on 09/02/2020 Specimen type: Core Needle Biopsy Histopathologic type: Infiltrating duct carcinoma, NOS Laterality: Left    05/30/2013 Initial Diagnosis   Breast cancer of upper-inner quadrant of left female breast ([place])   09/27/2020 - 02/02/2022 Chemotherapy   Patient is on Treatment Plan : BREAST Trastuzumab SubQ q21d     02/23/2022 - 02/23/2022 Chemotherapy   Patient is on Treatment Plan : BREAST Trastuzumab + Pertuzumab SubQ q21d     Metastasis to bone ([place])  07/03/2020 Initial Diagnosis   Metastasis to bone ([place])   09/27/2020 - 02/02/2022 Chemotherapy   Patient is on Treatment Plan : BREAST Trastuzumab SubQ q21d      Rya is here for routine follow up prior to a 24th cycle of trastuzumab. She states that she is weak and has had some minor falls with no serious injuries.  I think she is declining in general and might benefit from further physical therapy.  She is not eating well and we had a discussion about this. She has severe fatigue and lack of energy. Her last CTA in April was negative for pulmonary embolism and just showed mild pulmonary fibrosis and similar appearance of the enlarged metastatic left axillary and subpectoral lymph nodes, as well as the sclerotic osseous metastatic lesions.  She is using ibuprofen 600 mg as needed for pain, and occasional Tylenol 3. She does have pain and paresthesias of the lower extremities, consistent with neuropathy, which occasionally wakes her up during the night. Platelets remained elevated at 517,000 and white count and hemoglobin are normal. Chemistries are unremarkable except for an alkaline phosphatase of 385 and her SGOT is mildly elevated at 70. We have stopped the fulvestrant injections. We switched her to oral letrozole in May since her cancer marker continues to rise dramatically. The latest one went from 889 to 923.8 in April, and then to 1167.7, but the last one had decreased slightly to 1145. Her  appetite is poor, and her weight has decreased 2 pounds since her last visit. Her repeat echocardiogram was on July 27 at [facility], and looked stable, with an EF  of 60-65%. She denies fever, chills or other signs of infection.  She denies nausea, vomiting, bowel issues, or abdominal pain.  She denies sore throat, cough, dyspnea, or chest pain. They request that we recheck a urine since she has had so many recurrent urinary tract infections.  I will therefore get a urinalysis and urine culture if she is able to give [place] a specimen.    HISTORY:   Allergies:  Allergies  Allergen Reactions   Neosporin [Neomycin-Polymyxin-Gramicidin] Rash   Sulfa Antibiotics Hives    itching   Bacitra-Neomycin-Polymyxin-Hc    Bacitracin-Polymyxin B    Bexsero [Meningococcal Grp B (  Recomb) Vaccine] Other (See Comments)    Pt reported severe arthralgias and fatigue after 1st dose of bexsero and wishes to not receive again.   Cefdinir Nausea And Vomiting   Denosumab     Pain in jaw/head/mouth numb/weak   Neomycin-Bacitracin Zn-Polymyx    Other Other (See Comments)    Polysporin causes a rash and swelling Unknown   Statins    Macrodantin [Nitrofurantoin] Itching and Rash    Pt's daughter,[name], called to state that the last time Dr Bea Graff put her on this med, she had a lot of itching and redness in areas.    Current Medications: Current Outpatient Medications  Medication Sig Dispense Refill   amLODipine (NORVASC) 2.5 MG tablet TAKE 1 TABLET BY MOUTH DAILY (Patient taking differently: Take 2.5 mg by mouth daily.) 90 tablet 3   aspirin 81 MG tablet Take 81 mg by mouth daily.     carvedilol (COREG) 12.5 MG tablet Take 1 tablet (12.5 mg total) by mouth 2 (two) times daily with a meal. 180 tablet 3   EPINEPHrine (EPIPEN 2-PAK) 0.3 mg/0.3 mL IJ SOAJ injection Inject 0.3 mg into the muscle as needed for anaphylaxis. 1 each 1   furosemide (LASIX) 20 MG tablet Take 1 tablet (20 mg total) by mouth daily. (Patient taking differently: Take 20 mg by mouth once a week.) 90 tablet 3   ibuprofen (ADVIL) 600 MG tablet Take 1 tablet (600 mg total) by mouth as needed for headache. 30  tablet 2   loratadine (CLARITIN) 10 MG tablet Take 10 mg by mouth daily.     morphine (ROXANOL) 20 MG/ML concentrated solution Take 0.25 mLs (5 mg total) by mouth every 2 (two) hours as needed for severe pain. 30 mL 0   omeprazole (PRILOSEC) 20 MG capsule Take 20 mg by mouth at bedtime.      polyethylene glycol (MIRALAX / GLYCOLAX) 17 g packet Take 17 g by mouth 2 (two) times daily. 60 each 2   SAVELLA 50 MG TABS tablet Take 50 mg by mouth 2 (two) times daily.     senna-docusate (SENOKOT-S) 8.6-50 MG tablet Take 2 tablets by mouth 2 (two) times daily. 60 tablet 2   No current facility-administered medications for this visit.    REVIEW OF SYSTEMS:  Review of Systems  Constitutional:  Negative for appetite change, chills, fatigue, fever and unexpected weight change.  HENT:  Negative.    Eyes: Negative.   Respiratory: Negative.  Negative for chest tightness, cough, hemoptysis, shortness of breath and wheezing.   Cardiovascular: Negative.  Negative for chest pain, leg swelling and palpitations.  Gastrointestinal:  Negative for abdominal distention, abdominal pain, blood in stool, constipation, diarrhea, nausea and vomiting.  Endocrine: Negative.   Genitourinary:  Negative for difficulty urinating, dysuria, frequency and hematuria.   Musculoskeletal:  Positive for back pain (of the lower back, chronic) and gait problem (in a wheelchair). Negative for arthralgias, flank pain and myalgias.  Skin: Negative.   Neurological:  Positive for extremity weakness, gait problem (in a wheelchair) and numbness (neuropathy/paresthesias of the lower extremities). Negative for dizziness, headaches, light-headedness, seizures and speech difficulty.  Hematological: Negative.   Psychiatric/Behavioral: Negative.  Negative for depression and sleep disturbance. The patient is not nervous/anxious.       VITALS:  Blood pressure (!) 119/59, pulse 80, temperature 97.6 F (36.4 C), temperature source Oral, resp. rate  18, height '5\' 6"'  (1.676 m), weight 152 lb (68.9 kg), SpO2 96 %.  Wt Readings from Last  3 Encounters:  02/24/22 152 lb 8 oz (69.2 kg)  02/18/22 152 lb (68.9 kg)  01/28/22 154 lb 6.4 oz (70 kg)    Body mass index is 24.53 kg/m.  Performance status (ECOG): 2  PHYSICAL EXAM:  Physical Exam Constitutional:      General: She is not in acute distress.    Appearance: Normal appearance. She is normal weight.  HENT:     Head: Normocephalic and atraumatic.  Eyes:     General: No scleral icterus.    Extraocular Movements: Extraocular movements intact.     Conjunctiva/sclera: Conjunctivae normal.     Pupils: Pupils are equal, round, and reactive to light.  Cardiovascular:     Rate and Rhythm: Normal rate and regular rhythm.     Pulses: Normal pulses.     Heart sounds: Normal heart sounds. No murmur heard.    No friction rub. No gallop.  Pulmonary:     Effort: Pulmonary effort is normal. No respiratory distress.     Breath sounds: Normal breath sounds.  Abdominal:     General: Bowel sounds are normal. There is no distension.     Palpations: Abdomen is soft. There is no hepatomegaly, splenomegaly or mass.     Tenderness: There is no abdominal tenderness.  Musculoskeletal:        General: Normal range of motion.     Cervical back: Normal range of motion and neck supple.     Right lower leg: No edema.     Left lower leg: No edema.  Lymphadenopathy:     Cervical: No cervical adenopathy.  Skin:    General: Skin is warm and dry.  Neurological:     General: No focal deficit present.     Mental Status: She is alert and oriented to person, place, and time. Mental status is at baseline.  Psychiatric:        Mood and Affect: Mood normal.        Behavior: Behavior normal.        Thought Content: Thought content normal.        Judgment: Judgment normal.      LABS:      Latest Ref Rng & Units 02/24/2022   11:45 AM 02/23/2022    5:10 PM 02/18/2022   12:00 AM  CBC  WBC 4.0 - 10.5 K/uL  9.5  10.8  10.4      Hemoglobin 12.0 - 15.0 g/dL 13.4  12.8  12.6      Hematocrit 36.0 - 46.0 % 40.7  37.9  38      Platelets 150 - 400 K/uL 479  508  517         This result is from an external source.      Latest Ref Rng & Units 02/25/2022    4:49 AM 02/24/2022   11:45 AM 02/23/2022    5:10 PM  CMP  Glucose 70 - 99 mg/dL 111  130  117   BUN 8 - 23 mg/dL '16  12  17   ' Creatinine 0.44 - 1.00 mg/dL 0.60  0.61  0.68   Sodium 135 - 145 mmol/L 135  133  133   Potassium 3.5 - 5.1 mmol/L 4.1  3.3  3.5   Chloride 98 - 111 mmol/L 104  97  97   CO2 22 - 32 mmol/L '25  25  27   ' Calcium 8.9 - 10.3 mg/dL 9.2  9.8  9.9   Total Protein 6.5 - 8.1  g/dL  7.3  7.5   Total Bilirubin 0.3 - 1.2 mg/dL  0.9  0.7   Alkaline Phos 38 - 126 U/L  314  354   AST 15 - 41 U/L  53  70   ALT 0 - 44 U/L  22  21      Lab Results  Component Value Date   CEA1 8.3 (H) 02/19/2021   /  CEA  Date Value Ref Range Status  02/19/2021 8.3 (H) 0.0 - 4.7 ng/mL Final    Comment:    (NOTE)                             Nonsmokers          <3.9                             Smokers             <5.6 [employer] Electrochemiluminescence Immunoassay (ECLIA) Values obtained with different assay methods or kits cannot be used interchangeably.  Results cannot be interpreted as absolute evidence of the presence or absence of malignant disease. Performed At: [facility] [place], [place] [id number] Rush Farmer MD YF:[id number]     Lab Results  Component Value Date   FMB846 23.7 07/22/2020      STUDIES:  MR CERVICAL SPINE W WO CONTRAST  Result Date: 02/25/2022 CLINICAL DATA:  Evaluation for metastatic disease EXAM: MRI CERVICAL SPINE WITHOUT AND WITH CONTRAST TECHNIQUE: Multiplanar and multiecho pulse sequences of the cervical spine, to include the craniocervical junction and cervicothoracic junction, were obtained without and with intravenous contrast. CONTRAST:  37m GADAVIST GADOBUTROL 1  MMOL/ML IV SOLN COMPARISON:  No prior MRI of the cervical spine available FINDINGS: Alignment: Trace retrolisthesis C3 on C4. Trace anterolisthesis C7 on T1. Vertebrae: Diffusely abnormal marrow signal, seen throughout the cervical and upper thoracic spine, concerning for diffuse osseous metastatic disease. No evidence of pathologic fracture. Cord: Normal in signal and morphology. Possible small T2 hyperintense lesion in the central spinal cord possibly in the canal, which is only seen on the sagittal T2 images (series 10, image 8) at the level of C7. No abnormal spinal cord enhancement. Posterior Fossa, vertebral arteries, paraspinal tissues: Normal craniocervical junction. Patent vertebral artery flow voids. Disc levels: C2-C3: Minimal disc bulge. Facet and uncovertebral hypertrophy. No spinal canal stenosis. Mild right neural foraminal narrowing. C3-C4: Trace retrolisthesis with disc osteophyte complex. Facet and uncovertebral hypertrophy. Mild spinal canal stenosis. Mild bilateral neural foraminal narrowing. C4-C5: Disc height loss with small disc osteophyte complex. Facet arthropathy. No spinal canal stenosis or neural foraminal narrowing. C5-C6: Disc height loss with disc osteophyte complex. Facet and uncovertebral hypertrophy. No spinal canal stenosis. Mild right neural foraminal narrowing. C6-C7: Small disc osteophyte complex. Facet and uncovertebral hypertrophy. No spinal canal stenosis. Mild left neural foraminal narrowing. C7-T1: Trace anterolisthesis with minimal disc bulge. No spinal canal stenosis. Mild bilateral neural foraminal narrowing. IMPRESSION: 1. Diffusely abnormal marrow signal in the cervical and upper thoracic spine, concerning for diffuse osseous metastatic disease. 2. C3-C4 mild spinal canal stenosis and mild bilateral neural foraminal narrowing. 3. No additional spinal canal stenosis, with mild neural foraminal narrowing noted on the right at C2-C3 and C5-C6, on the left at C6-C7, and  bilaterally at C7-T1. Electronically Signed   By: AMerilyn BabaM.D.   On: 02/25/2022 01:39   EEG  adult  Result Date: 02/24/2022 Lora Havens, MD     02/24/2022 10:14 PM Patient Name: Neiva Maenza Harden MRN: [id number] Epilepsy Attending: Lora Havens Referring Physician/Provider: Vernelle Emerald, MD Date: 02/24/2022 Duration: 24.25 mins Patient history: 88yo F with syncope. EEG to evaluate for seizure. Level of alertness: Awake, drowsy AEDs during EEG study: None Technical aspects: This EEG study was done with scalp electrodes positioned according to the 10-20 International system of electrode placement. Electrical activity was reviewed with band pass filter of 1-'70Hz' , sensitivity of 7 uV/mm, display speed of 6m/sec with a '60Hz'  notched filter applied as appropriate. EEG data were recorded continuously and digitally stored.  Video monitoring was available and reviewed as appropriate. Description: The posterior dominant rhythm consists of 8 Hz activity of moderate voltage (25-35 uV) seen predominantly in posterior head regions, symmetric and reactive to eye opening and eye closing. Drowsiness was characterized by attenuation of the posterior background rhythm. EEG showed intermittent generalized 3 to 6 Hz theta-delta slowing. Hyperventilation and photic stimulation were not performed.   ABNORMALITY - Intermittent slow, generalized IMPRESSION: This study is suggestive of mild diffuse encephalopathy, nonspecific etiology. No seizures or epileptiform discharges were seen throughout the recording. PLora Havens  MR BRAIN W WO CONTRAST  Result Date: 02/24/2022 CLINICAL DATA:  Brain metastases suspected, history of metastatic breast cancer, weakness, confusion EXAM: MRI HEAD WITHOUT AND WITH CONTRAST TECHNIQUE: Multiplanar, multiecho pulse sequences of the brain and surrounding structures were obtained without and with intravenous contrast. CONTRAST:  768mGADAVIST GADOBUTROL 1 MMOL/ML IV SOLN  COMPARISON:  Prior MRI head from 02/13/2020 could not be retrieved. Correlation is made with 02/23/2022 CT head FINDINGS: Brain: Along the anterior right frontal convexity, there is a lesion that appears to be dural-based and demonstrates some enhancement, which measures approximately 0.9 x 2.1 x 1.1 cm (AP x TR x CC) (series 16, image 107 and series 19, image 5). This exerts mild mass effect on the underlying frontal lobe without evidence of increased T2 hyperintense signal in the parenchyma. The lesion appears to extend through the calvarium (series 16, image 105-108) to an area of enhancing tissue on the right frontal scalp. The scalp lesion enhances and measures approximately 3.9 x 4.5 x 1.3 cm (series 20, image 9 and series 19, image 11). No restricted diffusion to suggest acute or subacute infarct. No acute hemorrhage or midline shift. No hydrocephalus or extra-axial collection. No abnormal parenchymal enhancement. Confluent T2 hyperintense signal in the periventricular white matter and pons, likely the sequela of severe chronic small vessel ischemic disease. Lacunar infarcts in the bilateral thalami, basal ganglia, and cerebellar hemispheres. Vascular: Normal arterial flow voids. Normal arterial and venous enhancement. Skull and upper cervical spine: Numerous enhancing lesions throughout the calvarium, most likely metastatic disease. Abnormal marrow signal is also noted in the cervical spine. Sinuses/Orbits: No acute finding. Status post bilateral lens replacements. Other: The mastoids are well aerated. IMPRESSION: 1. Enhancing lesion along the anterior right frontal convexity, likely dural-based, which extends through the right frontal calvarium and appears connected to a right frontal scalp lesion. Mild mass effect on the right frontal lobe without edema. Given the patient's history, a dural-based or osseous metastatic process is favored. 2. Numerous enhancing lesions in the calvarium, most likely  metastatic disease given the patient's history. Abnormal marrow signal is also noted in the imaged cervical spine. 3. No evidence of parenchymal metastatic disease. These results were called by telephone at the time of interpretation on 02/24/2022  at 12:34 am to provider Inda Merlin , who verbally acknowledged these results. Electronically Signed   By: Merilyn Baba M.D.   On: 02/24/2022 00:35   CT L-SPINE NO CHARGE  Result Date: 02/23/2022 CLINICAL DATA:  Pt bib PTAR for ams and previous fall on August 10th. Pt experienced LOC prior to fall. Pt sent in by family for increased AMS EXAM: CT LUMBAR SPINE WITHOUT CONTRAST TECHNIQUE: Multidetector CT imaging of the lumbar spine was performed without intravenous contrast administration. Multiplanar CT image reconstructions were also generated. RADIATION DOSE REDUCTION: This exam was performed according to the departmental dose-optimization program which includes automated exposure control, adjustment of the mA and/or kV according to patient size and/or use of iterative reconstruction technique. COMPARISON:  PET-CT 10/07/2020 FINDINGS: Segmentation: 5 lumbar type vertebrae. Alignment: Dextrocurvature centered at L3 level. Mild retrolisthesis of L1 on L2, L2 on L3, L3 on L4. Vertebrae: No acute fracture. Diffuse axial and appendicular sclerotic osseous metastases. Paraspinal and other soft tissues: Negative. Disc levels: Multilevel intervertebral disc space narrowing with intervertebral disc space vacuum phenomenon at the L2-L3 level. Other: Atherosclerotic plaque. Please see separately dictated CT abdomen pelvis 02/23/2022. IMPRESSION: 1. No acute displaced fracture or traumatic listhesis of the lumbar spine. 2. Diffuse axial and appendicular sclerotic osseous metastases. 3. Please see separately dictated CT abdomen pelvis 02/23/2022. 4.  Aortic Atherosclerosis (ICD10-I70.0). Electronically Signed   By: Iven Finn M.D.   On: 02/23/2022 20:33   CT Abdomen  Pelvis W Contrast  Result Date: 02/23/2022 CLINICAL DATA:  Pt bib PTAR for ams and previous fall on August 10th. Pt experienced LOC prior to fall. Pt sent in by family for increased AMS. Abdominal pain, acute, nonlocalized mild elevation lfts, hx cancer EXAM: CT ABDOMEN AND PELVIS WITH CONTRAST TECHNIQUE: Multidetector CT imaging of the abdomen and pelvis was performed using the standard protocol following bolus administration of intravenous contrast. RADIATION DOSE REDUCTION: This exam was performed according to the departmental dose-optimization program which includes automated exposure control, adjustment of the mA and/or kV according to patient size and/or use of iterative reconstruction technique. CONTRAST:  174m OMNIPAQUE IOHEXOL 300 MG/ML  SOLN COMPARISON:  CT angiography chest 10/18/2021, PET CT 10/07/2020, MRI abdomen 09/10/2016 FINDINGS: Lower chest: Peripheral reticulations with cystic changes. Hepatobiliary: No focal liver abnormality. Status post cholecystectomy. No biliary dilatation. Pancreas: Vague hypodensity along the pancreatic head likely related to known pancreatic divisum that is not well visualized (2:20). No definite focal lesion. Normal pancreatic contour. No surrounding inflammatory changes. No main pancreatic ductal dilatation. Spleen: Status post splenectomy. Adrenals/Urinary Tract: No adrenal nodule bilaterally. Bilateral kidneys enhance symmetrically. Multi septated (thin septations) 5.1 x 4.1 cm left renal cyst appears grossly stable in size. Subcentimeter hypodensities are too small to characterize. 6 mm right nephrolithiasis. No left nephrolithiasis. No hydronephrosis. No hydroureter. The urinary bladder is unremarkable. Stomach/Bowel: Stomach is within normal limits. No evidence of bowel wall thickening or dilatation. Colonic diverticulosis. Appendix appears normal. Vascular/Lymphatic: Query filling defect within the left portal vein noted on axial view likely artifactual with no  finding on coronal sagittal view. No abdominal aorta or iliac aneurysm. Mild atherosclerotic plaque of the aorta and its branches. No abdominal, pelvic, or inguinal lymphadenopathy. Reproductive: Uterus and bilateral adnexa are unremarkable. Other: No intraperitoneal free fluid. No intraperitoneal free gas. No organized fluid collection. Musculoskeletal: No abdominal wall hernia or abnormality. Diffuse sclerotic lesion of the axial and appendicular skeleton. Lytic lesion along the left iliac bone (2:58). No acute displaced fracture. Please see  separately dictated CT lumbar spine 02/23/2022. IMPRESSION: 1. No acute traumatic injury of the abdomen or pelvis. 2. Nonobstructing 6 mm right nephrolithiasis. 3. Colonic diverticulosis with no acute diverticulitis. 4. Grossly stable in size minimally complex 5.1 x 4.1 cm left renal cyst. Lesion better evaluated on MRI abdomen 09/10/2016. 5.  Aortic Atherosclerosis (ICD10-I70.0). 6. Please see separately dictated CT lumbar spine 02/23/2022. Electronically Signed   By: Iven Finn M.D.   On: 02/23/2022 20:30   CT Head Wo Contrast  Result Date: 02/23/2022 CLINICAL DATA:  Mental status change.  Previous fall. EXAM: CT HEAD WITHOUT CONTRAST TECHNIQUE: Contiguous axial images were obtained from the base of the skull through the vertex without intravenous contrast. RADIATION DOSE REDUCTION: This exam was performed according to the departmental dose-optimization program which includes automated exposure control, adjustment of the mA and/or kV according to patient size and/or use of iterative reconstruction technique. COMPARISON:  None Available. FINDINGS: Brain: No evidence of acute infarction, hemorrhage, hydrocephalus, extra-axial collection or mass lesion/mass effect. Remote infarct within the left cerebellar hemisphere identified, image 55/5. There is mild diffuse low-attenuation within the subcortical and periventricular white matter compatible with chronic  microvascular disease. Prominence of the sulci and ventricles compatible with brain atrophy. Vascular: No hyperdense vessel or unexpected calcification. Skull: Normal. Negative for fracture or focal lesion. Sinuses/Orbits: No acute finding. Other: There is a right frontal scalp hematoma which measures approximately 9 mm in thickness. No underlying skull fracture identified. IMPRESSION: 1. No acute intracranial abnormalities. 2. Chronic small vessel ischemic disease and brain atrophy. 3. Remote infarct within the left cerebellar hemisphere. 4. Right frontal scalp hematoma. Electronically Signed   By: Kerby Moors M.D.   On: 02/23/2022 20:24   DG Chest Port 1 View  Result Date: 02/23/2022 CLINICAL DATA:  Altered mental status and weakness. EXAM: PORTABLE CHEST 1 VIEW COMPARISON:  CT chest and chest x-ray dated October 18, 2021. FINDINGS: Stable cardiomediastinal silhouette. Unchanged mild peripheral and basilar predominant interstitial thickening. 1.7 cm nodular density at the right lung base has been present on prior studies and represents prominent costochondral calcification, more accentuated on today's exam given mild leftward patient rotation. No focal consolidation, pleural effusion, or pneumothorax. No acute osseous abnormality. IMPRESSION: 1. No active disease. 2. Unchanged mild interstitial lung disease. Electronically Signed   By: Titus Dubin M.D.   On: 02/23/2022 17:39       I,Gabriella Ballesteros,acting as a scribe for [name], MD.,have documented all relevant documentation on the behalf of [name], MD,as directed by  [name], MD while in the presence of [name], MD.

## 2022-02-19 ENCOUNTER — Other Ambulatory Visit: Payer: Self-pay | Admitting: Oncology

## 2022-02-19 ENCOUNTER — Other Ambulatory Visit: Payer: Self-pay

## 2022-02-19 ENCOUNTER — Other Ambulatory Visit: Payer: Self-pay | Admitting: Pharmacist

## 2022-02-19 DIAGNOSIS — C50212 Malignant neoplasm of upper-inner quadrant of left female breast: Secondary | ICD-10-CM

## 2022-02-19 LAB — CANCER ANTIGEN 27.29: CA 27.29: 1290.8 U/mL — ABNORMAL HIGH (ref 0.0–38.6)

## 2022-02-20 ENCOUNTER — Inpatient Hospital Stay: Payer: Medicare Other

## 2022-02-20 ENCOUNTER — Other Ambulatory Visit: Payer: Self-pay

## 2022-02-20 ENCOUNTER — Telehealth: Payer: Self-pay

## 2022-02-20 DIAGNOSIS — C50212 Malignant neoplasm of upper-inner quadrant of left female breast: Secondary | ICD-10-CM

## 2022-02-20 DIAGNOSIS — C778 Secondary and unspecified malignant neoplasm of lymph nodes of multiple regions: Secondary | ICD-10-CM | POA: Diagnosis not present

## 2022-02-20 LAB — URINALYSIS, COMPLETE (UACMP) WITH MICROSCOPIC
Bilirubin Urine: NEGATIVE
Glucose, UA: NEGATIVE mg/dL
Hgb urine dipstick: NEGATIVE
Ketones, ur: NEGATIVE mg/dL
Leukocytes,Ua: NEGATIVE
Nitrite: NEGATIVE
Protein, ur: NEGATIVE mg/dL
Specific Gravity, Urine: 1.008 (ref 1.005–1.030)
pH: 6 (ref 5.0–8.0)

## 2022-02-20 MED FILL — Pertuzumab-Trastuz-Hyaluron-zzxf Inj 60 MG-60 MG-2000 UNT/ML: SUBCUTANEOUS | Qty: 10 | Status: AC

## 2022-02-20 NOTE — Telephone Encounter (Signed)
[name], PT, w/[name], called to discuss pt needs & request orders. She needs some equipment in the home - electronic hospital bed (as pt's spouse cant crank the bed up and down), & a tilt w/c that supports pt's back and neck (as she slumps to the side of w/c).He mentioned possibly Hospice appropriate, but pt is getting treatment, so that isn't an option. I also saw  in office note that family didn't even want to discuss Hospice. Need orders to see pt once weekly for 4 weeks.

## 2022-02-22 LAB — URINE CULTURE

## 2022-02-23 ENCOUNTER — Ambulatory Visit: Payer: Medicare Other

## 2022-02-23 ENCOUNTER — Emergency Department (HOSPITAL_COMMUNITY): Payer: Medicare Other

## 2022-02-23 ENCOUNTER — Telehealth: Payer: Self-pay | Admitting: Oncology

## 2022-02-23 ENCOUNTER — Encounter: Payer: Self-pay | Admitting: Oncology

## 2022-02-23 ENCOUNTER — Other Ambulatory Visit: Payer: Self-pay

## 2022-02-23 ENCOUNTER — Observation Stay (HOSPITAL_COMMUNITY): Payer: Medicare Other

## 2022-02-23 ENCOUNTER — Encounter (HOSPITAL_COMMUNITY): Payer: Self-pay

## 2022-02-23 ENCOUNTER — Inpatient Hospital Stay (HOSPITAL_COMMUNITY)
Admission: EM | Admit: 2022-02-23 | Discharge: 2022-02-26 | DRG: 055 | Disposition: A | Discharge status: Hospice | Payer: Medicare Other | Attending: Internal Medicine | Admitting: Internal Medicine

## 2022-02-23 ENCOUNTER — Telehealth: Payer: Self-pay

## 2022-02-23 DIAGNOSIS — Z20822 Contact with and (suspected) exposure to covid-19: Secondary | ICD-10-CM | POA: Diagnosis present

## 2022-02-23 DIAGNOSIS — Z8673 Personal history of transient ischemic attack (TIA), and cerebral infarction without residual deficits: Secondary | ICD-10-CM

## 2022-02-23 DIAGNOSIS — E871 Hypo-osmolality and hyponatremia: Secondary | ICD-10-CM | POA: Diagnosis present

## 2022-02-23 DIAGNOSIS — K5900 Constipation, unspecified: Secondary | ICD-10-CM | POA: Diagnosis present

## 2022-02-23 DIAGNOSIS — I739 Peripheral vascular disease, unspecified: Secondary | ICD-10-CM | POA: Diagnosis present

## 2022-02-23 DIAGNOSIS — Z8249 Family history of ischemic heart disease and other diseases of the circulatory system: Secondary | ICD-10-CM

## 2022-02-23 DIAGNOSIS — G9341 Metabolic encephalopathy: Secondary | ICD-10-CM | POA: Diagnosis present

## 2022-02-23 DIAGNOSIS — Z825 Family history of asthma and other chronic lower respiratory diseases: Secondary | ICD-10-CM

## 2022-02-23 DIAGNOSIS — I110 Hypertensive heart disease with heart failure: Secondary | ICD-10-CM | POA: Diagnosis present

## 2022-02-23 DIAGNOSIS — E538 Deficiency of other specified B group vitamins: Secondary | ICD-10-CM | POA: Diagnosis present

## 2022-02-23 DIAGNOSIS — G9340 Encephalopathy, unspecified: Secondary | ICD-10-CM | POA: Diagnosis present

## 2022-02-23 DIAGNOSIS — Z807 Family history of other malignant neoplasms of lymphoid, hematopoietic and related tissues: Secondary | ICD-10-CM

## 2022-02-23 DIAGNOSIS — R531 Weakness: Secondary | ICD-10-CM | POA: Diagnosis present

## 2022-02-23 DIAGNOSIS — R55 Syncope and collapse: Secondary | ICD-10-CM

## 2022-02-23 DIAGNOSIS — Z853 Personal history of malignant neoplasm of breast: Secondary | ICD-10-CM

## 2022-02-23 DIAGNOSIS — E782 Mixed hyperlipidemia: Secondary | ICD-10-CM | POA: Diagnosis present

## 2022-02-23 DIAGNOSIS — C7931 Secondary malignant neoplasm of brain: Secondary | ICD-10-CM | POA: Diagnosis not present

## 2022-02-23 DIAGNOSIS — Z803 Family history of malignant neoplasm of breast: Secondary | ICD-10-CM

## 2022-02-23 DIAGNOSIS — E876 Hypokalemia: Secondary | ICD-10-CM | POA: Diagnosis not present

## 2022-02-23 DIAGNOSIS — I951 Orthostatic hypotension: Secondary | ICD-10-CM | POA: Diagnosis present

## 2022-02-23 DIAGNOSIS — Z9012 Acquired absence of left breast and nipple: Secondary | ICD-10-CM

## 2022-02-23 DIAGNOSIS — G629 Polyneuropathy, unspecified: Secondary | ICD-10-CM | POA: Diagnosis present

## 2022-02-23 DIAGNOSIS — Z87440 Personal history of urinary (tract) infections: Secondary | ICD-10-CM

## 2022-02-23 DIAGNOSIS — Y92009 Unspecified place in unspecified non-institutional (private) residence as the place of occurrence of the external cause: Secondary | ICD-10-CM

## 2022-02-23 DIAGNOSIS — G4730 Sleep apnea, unspecified: Secondary | ICD-10-CM | POA: Diagnosis present

## 2022-02-23 DIAGNOSIS — M797 Fibromyalgia: Secondary | ICD-10-CM | POA: Diagnosis present

## 2022-02-23 DIAGNOSIS — R296 Repeated falls: Secondary | ICD-10-CM | POA: Diagnosis present

## 2022-02-23 DIAGNOSIS — I10 Essential (primary) hypertension: Secondary | ICD-10-CM | POA: Diagnosis present

## 2022-02-23 DIAGNOSIS — Z66 Do not resuscitate: Secondary | ICD-10-CM | POA: Diagnosis not present

## 2022-02-23 DIAGNOSIS — Z9081 Acquired absence of spleen: Secondary | ICD-10-CM

## 2022-02-23 DIAGNOSIS — C50919 Malignant neoplasm of unspecified site of unspecified female breast: Secondary | ICD-10-CM | POA: Diagnosis present

## 2022-02-23 DIAGNOSIS — C7951 Secondary malignant neoplasm of bone: Secondary | ICD-10-CM | POA: Diagnosis present

## 2022-02-23 DIAGNOSIS — Z515 Encounter for palliative care: Secondary | ICD-10-CM

## 2022-02-23 DIAGNOSIS — I5032 Chronic diastolic (congestive) heart failure: Secondary | ICD-10-CM | POA: Diagnosis present

## 2022-02-23 DIAGNOSIS — H9190 Unspecified hearing loss, unspecified ear: Secondary | ICD-10-CM | POA: Diagnosis present

## 2022-02-23 DIAGNOSIS — K219 Gastro-esophageal reflux disease without esophagitis: Secondary | ICD-10-CM | POA: Diagnosis present

## 2022-02-23 DIAGNOSIS — W19XXXA Unspecified fall, initial encounter: Secondary | ICD-10-CM | POA: Diagnosis present

## 2022-02-23 LAB — URINALYSIS, ROUTINE W REFLEX MICROSCOPIC
Bilirubin Urine: NEGATIVE
Glucose, UA: NEGATIVE mg/dL
Hgb urine dipstick: NEGATIVE
Ketones, ur: NEGATIVE mg/dL
Leukocytes,Ua: NEGATIVE
Nitrite: NEGATIVE
Protein, ur: NEGATIVE mg/dL
Specific Gravity, Urine: 1.011 (ref 1.005–1.030)
pH: 7 (ref 5.0–8.0)

## 2022-02-23 LAB — CBC WITH DIFFERENTIAL/PLATELET
Abs Immature Granulocytes: 0.05 10*3/uL (ref 0.00–0.07)
Basophils Absolute: 0.1 10*3/uL (ref 0.0–0.1)
Basophils Relative: 1 %
Eosinophils Absolute: 0.3 10*3/uL (ref 0.0–0.5)
Eosinophils Relative: 3 %
HCT: 37.9 % (ref 36.0–46.0)
Hemoglobin: 12.8 g/dL (ref 12.0–15.0)
Immature Granulocytes: 1 %
Lymphocytes Relative: 31 %
Lymphs Abs: 3.4 10*3/uL (ref 0.7–4.0)
MCH: 29.3 pg (ref 26.0–34.0)
MCHC: 33.8 g/dL (ref 30.0–36.0)
MCV: 86.7 fL (ref 80.0–100.0)
Monocytes Absolute: 1.7 10*3/uL — ABNORMAL HIGH (ref 0.1–1.0)
Monocytes Relative: 15 %
Neutro Abs: 5.4 10*3/uL (ref 1.7–7.7)
Neutrophils Relative %: 49 %
Platelets: 508 10*3/uL — ABNORMAL HIGH (ref 150–400)
RBC: 4.37 MIL/uL (ref 3.87–5.11)
RDW: 16 % — ABNORMAL HIGH (ref 11.5–15.5)
WBC: 10.8 10*3/uL — ABNORMAL HIGH (ref 4.0–10.5)
nRBC: 0 % (ref 0.0–0.2)

## 2022-02-23 LAB — COMPREHENSIVE METABOLIC PANEL
ALT: 21 U/L (ref 0–44)
AST: 70 U/L — ABNORMAL HIGH (ref 15–41)
Albumin: 3 g/dL — ABNORMAL LOW (ref 3.5–5.0)
Alkaline Phosphatase: 354 U/L — ABNORMAL HIGH (ref 38–126)
Anion gap: 9 (ref 5–15)
BUN: 17 mg/dL (ref 8–23)
CO2: 27 mmol/L (ref 22–32)
Calcium: 9.9 mg/dL (ref 8.9–10.3)
Chloride: 97 mmol/L — ABNORMAL LOW (ref 98–111)
Creatinine, Ser: 0.68 mg/dL (ref 0.44–1.00)
GFR, Estimated: >60 mL/min (ref >60)
Glucose, Bld: 117 mg/dL — ABNORMAL HIGH (ref 70–99)
Potassium: 3.5 mmol/L (ref 3.5–5.1)
Sodium: 133 mmol/L — ABNORMAL LOW (ref 135–145)
Total Bilirubin: 0.7 mg/dL (ref 0.3–1.2)
Total Protein: 7.5 g/dL (ref 6.5–8.1)

## 2022-02-23 LAB — TROPONIN I (HIGH SENSITIVITY)
Troponin I (High Sensitivity): 9 ng/L (ref <18)
Troponin I (High Sensitivity): 9 ng/L (ref <18)

## 2022-02-23 LAB — AMMONIA: Ammonia: 27 umol/L (ref 9–35)

## 2022-02-23 LAB — CBG MONITORING, ED: Glucose-Capillary: 112 mg/dL — ABNORMAL HIGH (ref 70–99)

## 2022-02-23 MED ORDER — IOHEXOL 300 MG/ML  SOLN
100.0000 mL | Freq: Once | INTRAMUSCULAR | Status: AC | PRN
  Administered 2022-02-23: 100 mL via INTRAVENOUS

## 2022-02-23 MED ORDER — GADOBUTROL 1 MMOL/ML IV SOLN
7.0000 mL | Freq: Once | INTRAVENOUS | Status: AC | PRN
  Administered 2022-02-23: 7 mL via INTRAVENOUS

## 2022-02-23 MED ORDER — SODIUM CHLORIDE 0.9 % IV BOLUS
1000.0000 mL | Freq: Once | INTRAVENOUS | Status: AC
Start: 2022-02-23 — End: 2022-02-23
  Administered 2022-02-23: 1000 mL via INTRAVENOUS

## 2022-02-23 NOTE — ED Notes (Signed)
Pt placed on bedpan as requested. Family at bedside and patient is oriented to the callbell.

## 2022-02-23 NOTE — ED Triage Notes (Signed)
Pt bib PTAR for ams and previous fall on August 10th.  Pt experienced LOC prior to fall.  Pt sent in by family for increased AMS

## 2022-02-23 NOTE — ED Provider Notes (Signed)
[facility] DEPT Provider Note   CSN: [id number] Arrival date & time: 02/23/22  1651     History {Add pertinent medical, surgical, social history, OB history to HPI:1} Chief Complaint  Patient presents with   Altered Mental Status   Fall    Amy Kline is a 88 y.o. female.  She lives at home with family.  Reportedly had a fall few days ago.  Since then she has had more difficulty ambulating and family is having difficulty caring for her.  Generally weak and intermittently confused.  Patient herself is not sure when she fell thinks it might have been yesterday and had some upper back pain although denies currently.  She does not have any complaints.  No headache chest pain shortness of breath abdominal pain or dysuria.  The history is provided by the patient and the EMS personnel.  Weakness Severity:  Severe Onset quality:  Gradual Duration:  4 days Timing:  Constant Progression:  Unchanged Chronicity:  New Relieved by:  Nothing Worsened by:  Activity Ineffective treatments:  None tried Associated symptoms: difficulty walking and falls   Associated symptoms: no abdominal pain, no chest pain, no cough, no dysuria, no fever, no headaches and no shortness of breath        Home Medications Prior to Admission medications   Medication Sig Start Date End Date Taking? Authorizing Provider  acetaminophen-codeine (TYLENOL #3) 300-30 MG tablet Take 1 tablet by mouth every 6 (six) hours as needed for moderate pain. 10/20/21   Dayton Scrape A, NP  alendronate (FOSAMAX) 70 MG tablet TAKE 1 TABLET BY MOUTH  WEEKLY WITH 8 OZ OF PLAIN  WATER 30 MINUTES BEFORE  FIRST FOOD, DRINK OR MEDS.  STAY UPRIGHT FOR 30 MINS 09/10/21   [name], MD  amLODipine (NORVASC) 2.5 MG tablet TAKE 1 TABLET BY MOUTH DAILY 11/04/21   Richardo Priest, MD  aspirin 81 MG tablet Take 81 mg by mouth daily.    [provider]  carvedilol (COREG) 12.5 MG tablet Take  1 tablet (12.5 mg total) by mouth 2 (two) times daily with a meal. 05/02/21   Munley, Hilton Cork, MD  cyanocobalamin 1000 MCG tablet Take by mouth.    [provider]  EPINEPHrine (EPIPEN 2-PAK) 0.3 mg/0.3 mL IJ SOAJ injection Inject 0.3 mg into the muscle as needed for anaphylaxis. 12/17/21   Dayton Scrape A, NP  ergocalciferol (VITAMIN D2) 1.25 MG (50000 UT) capsule Take 1 capsule (50,000 Units total) by mouth once a week. 12/25/21   Dayton Scrape A, NP  fluconazole (DIFLUCAN) 150 MG tablet TAKE 1 TABLET(150 MG) BY MOUTH 1 TIME FOR 1 DOSE 12/17/21   Dayton Scrape A, NP  furosemide (LASIX) 20 MG tablet Take 1 tablet (20 mg total) by mouth daily. 12/16/21   Richardo Priest, MD  ibuprofen (ADVIL) 600 MG tablet Take 1 tablet (600 mg total) by mouth as needed for headache. 01/27/22   Rosanne Sack A, PA-C  lactose free nutrition (BOOST) LIQD Take 237 mLs by mouth as needed (other). for no appetite    [provider]  lamoTRIgine (LAMICTAL) 25 MG tablet Take 1 tablet (25 mg total) by mouth 2 (two) times daily. 08/21/21   Sater, Nanine Means, MD  letrozole [facility]) 2.5 MG tablet Take 1 tablet (2.5 mg total) by mouth daily. 10/30/21   [name], MD  loratadine (CLARITIN) 10 MG tablet Take 10 mg by mouth daily.    [provider]  omeprazole (PRILOSEC) 20 MG capsule Take 20 mg by mouth at bedtime.  11/14/12   [provider]  pyridostigmine (MESTINON) 60 MG tablet Take by mouth.    [provider]  SAVELLA 50 MG TABS tablet Take 50 mg by mouth 2 (two) times daily. 08/05/20   [provider]  telmisartan-hydrochlorothiazide (MICARDIS HCT) 40-12.5 MG tablet Take 0.5 tablets by mouth daily. 02/11/22   Richardo Priest, MD  traMADol (ULTRAM) 50 MG tablet Take 1 tablet (50 mg total) by mouth every 6 (six) hours as needed. 12/18/20   Melodye Ped, NP      Allergies    Neosporin [neomycin-polymyxin-gramicidin], Sulfa antibiotics,  Bacitra-neomycin-polymyxin-hc, Bacitracin-polymyxin b, Bexsero [meningococcal grp b (recomb) vaccine], Cefdinir, Denosumab, Neomycin-bacitracin zn-polymyx, Other, Statins, and Macrodantin [nitrofurantoin]    Review of Systems   Review of Systems  Constitutional:  Negative for fever.  Respiratory:  Negative for cough and shortness of breath.   Cardiovascular:  Negative for chest pain.  Gastrointestinal:  Negative for abdominal pain.  Genitourinary:  Negative for dysuria.  Musculoskeletal:  Positive for back pain and falls.  Neurological:  Positive for weakness. Negative for headaches.    Physical Exam Updated Vital Signs BP (!) 140/111   Pulse 92   Temp 98.3 F (36.8 C) (Oral)   Resp 18   Ht '5\' 6"'$  (1.676 m)   Wt 69 kg   SpO2 97%   BMI 24.55 kg/m  Physical Exam Vitals and nursing note reviewed.  Constitutional:      General: She is not in acute distress.    Appearance: Normal appearance. She is well-developed.  HENT:     Head: Normocephalic and atraumatic.  Eyes:     Conjunctiva/sclera: Conjunctivae normal.  Cardiovascular:     Rate and Rhythm: Normal rate and regular rhythm.     Heart sounds: No murmur heard. Pulmonary:     Effort: Pulmonary effort is normal. No respiratory distress.     Breath sounds: Normal breath sounds.  Abdominal:     Palpations: Abdomen is soft.     Tenderness: There is no abdominal tenderness. There is no guarding or rebound.  Musculoskeletal:        General: No deformity. Normal range of motion.     Cervical back: Neck supple.     Right lower leg: No edema.     Left lower leg: No edema.  Skin:    General: Skin is warm and dry.     Capillary Refill: Capillary refill takes less than 2 seconds.  Neurological:     General: No focal deficit present.     Mental Status: She is alert.     Comments: Patient is awake and alert.  She is moving her upper extremities without any difficulty.  She can wiggle her feet but really gives no effort on lifting  her leg off the bed bilaterally.     ED Results / Procedures / Treatments   Labs (all labs ordered are listed, but only abnormal results are displayed) Labs Reviewed  CBC WITH DIFFERENTIAL/PLATELET  COMPREHENSIVE METABOLIC PANEL  URINALYSIS, ROUTINE W REFLEX MICROSCOPIC  AMMONIA  CBG MONITORING, ED  TROPONIN I (HIGH SENSITIVITY)    EKG EKG Interpretation  Date/Time:  Monday February 23 2022 17:03:20 EDT Ventricular Rate:  89 PR Interval:  178 QRS Duration: 85 QT Interval:  347 QTC Calculation: 423 R Axis:   46 Text Interpretation: Sinus rhythm Probable left atrial enlargement Abnormal R-wave progression, early transition No  significant change since prior 4/23 Confirmed by Aletta Edouard [phone]) on 02/23/2022 5:06:59 PM  Radiology No results found.  Procedures Procedures  {Document cardiac monitor, telemetry assessment procedure when appropriate:1}  Medications Ordered in ED Medications  sodium chloride 0.9 % bolus 1,000 mL (has no administration in time range)    ED Course/ Medical Decision Making/ A&P                           Medical Decision Making Amount and/or Complexity of Data Reviewed Labs: ordered. Radiology: ordered.   This patient complains of ***; this involves an extensive number of treatment Options and is a complaint that carries with it a high risk of complications and morbidity. The differential includes ***  I ordered, reviewed and interpreted labs, which included *** I ordered medication *** and reviewed PMP when indicated. I ordered imaging studies which included *** and I independently    visualized and interpreted imaging which showed *** Additional history obtained from *** Previous records obtained and reviewed *** I consulted *** and discussed lab and imaging findings and discussed disposition.  Cardiac monitoring reviewed, *** Social determinants considered, *** Critical Interventions: ***  After the interventions stated above, I  reevaluated the patient and found *** Admission and further testing considered, ***   {Document critical care time when appropriate:1} {Document review of labs and clinical decision tools ie heart score, Chads2Vasc2 etc:1}  {Document your independent review of radiology images, and any outside records:1} {Document your discussion with family members, caretakers, and with consultants:1} {Document social determinants of health affecting pt's care:1} {Document your decision making why or why not admission, treatments were needed:1} Final Clinical Impression(s) / ED Diagnoses Final diagnoses:  None    Rx / DC Orders ED Discharge Orders     None

## 2022-02-23 NOTE — Telephone Encounter (Signed)
PATIENT DAUGHTER [name] CALLED AND CANCELLED HER APPT FOR TODAY FOR THE [place]. THE DAUGHTER STATED THAT THE PATIENT WAS TOO WEAK TO COME. THE PATIENT HAD REHAB LAST WEEK AND IT SEEMED TO WIPE HER OUT. THE PATIENT FELL NOT SHORTLY AFTER THE REHAB. WE CANCELLED THE APPT TODAY AND RESCHEDULE FOR TENTATIVE TREATMENT ON FRIDAY. PHARMACY [facility], PHARMD AWARE AND DR. MCCARTY AWARE. THE DAUGHTER WILL CALL ON 8/17 THIS WEEK TO LET us KNOW IF HER MOM CAN MAKE IT.

## 2022-02-23 NOTE — ED Notes (Signed)
Patient transported to CT 

## 2022-02-23 NOTE — Telephone Encounter (Signed)
Pt's daughter came into speak with nurse. She reported that her they need help with her mom. She states her 89y father is hurting his back trying to pick her up off the floor (she fell Thursday night). No injury with fall as they could see. No bleeding or goose egg formed on her head.Daughter reports that have home health was with her for 6 hours on Friday, she was completely overwhelmed. Nursing was there for 3 hours, then physical therapy was there for 3 hours. It was too much and she became really agitated.  She mentioned that she felt like her mom needed to be assessed and see what is wrong. It's like one day she is ok, the next day she isn't. She periodically has a blank stare. We definitely can take care of her until we get some help. She plans to go back to her mom's home and call the ambulance, as the are unable to get her in a personal vehicle. She understands that her mom most likely needs at least a short term rehab stay until she regains strength.  She plans to go home and call 911 and have them transport her to [facility]. Last time they come here was not a good experience. They took Mrs Blatter off stretcher and she sat in waiting room for 3 more hours, before leaving.

## 2022-02-23 NOTE — Telephone Encounter (Signed)
02/23/22 Spoke with daughter and patient is not feeling well.Cancelled appt on 02/23/22.

## 2022-02-24 ENCOUNTER — Encounter (HOSPITAL_COMMUNITY): Payer: Self-pay | Admitting: Internal Medicine

## 2022-02-24 ENCOUNTER — Inpatient Hospital Stay (HOSPITAL_COMMUNITY)
Admit: 2022-02-24 | Discharge: 2022-02-24 | Disposition: A | Discharge status: Home / Self Care | Payer: Medicare Other | Attending: Internal Medicine | Admitting: Internal Medicine

## 2022-02-24 ENCOUNTER — Inpatient Hospital Stay (HOSPITAL_COMMUNITY): Payer: Medicare Other

## 2022-02-24 ENCOUNTER — Encounter: Payer: Self-pay | Admitting: Oncology

## 2022-02-24 DIAGNOSIS — G4730 Sleep apnea, unspecified: Secondary | ICD-10-CM | POA: Diagnosis present

## 2022-02-24 DIAGNOSIS — E782 Mixed hyperlipidemia: Secondary | ICD-10-CM | POA: Diagnosis present

## 2022-02-24 DIAGNOSIS — C7951 Secondary malignant neoplasm of bone: Secondary | ICD-10-CM | POA: Diagnosis present

## 2022-02-24 DIAGNOSIS — W19XXXA Unspecified fall, initial encounter: Secondary | ICD-10-CM | POA: Diagnosis present

## 2022-02-24 DIAGNOSIS — Z66 Do not resuscitate: Secondary | ICD-10-CM | POA: Diagnosis not present

## 2022-02-24 DIAGNOSIS — E538 Deficiency of other specified B group vitamins: Secondary | ICD-10-CM | POA: Diagnosis present

## 2022-02-24 DIAGNOSIS — K5900 Constipation, unspecified: Secondary | ICD-10-CM | POA: Diagnosis present

## 2022-02-24 DIAGNOSIS — R531 Weakness: Secondary | ICD-10-CM | POA: Diagnosis present

## 2022-02-24 DIAGNOSIS — Z20822 Contact with and (suspected) exposure to covid-19: Secondary | ICD-10-CM | POA: Diagnosis present

## 2022-02-24 DIAGNOSIS — Z515 Encounter for palliative care: Secondary | ICD-10-CM | POA: Diagnosis not present

## 2022-02-24 DIAGNOSIS — I5032 Chronic diastolic (congestive) heart failure: Secondary | ICD-10-CM

## 2022-02-24 DIAGNOSIS — C50919 Malignant neoplasm of unspecified site of unspecified female breast: Secondary | ICD-10-CM | POA: Diagnosis present

## 2022-02-24 DIAGNOSIS — G9341 Metabolic encephalopathy: Secondary | ICD-10-CM | POA: Diagnosis present

## 2022-02-24 DIAGNOSIS — H9190 Unspecified hearing loss, unspecified ear: Secondary | ICD-10-CM | POA: Diagnosis present

## 2022-02-24 DIAGNOSIS — Z7189 Other specified counseling: Secondary | ICD-10-CM

## 2022-02-24 DIAGNOSIS — M797 Fibromyalgia: Secondary | ICD-10-CM | POA: Diagnosis present

## 2022-02-24 DIAGNOSIS — Z803 Family history of malignant neoplasm of breast: Secondary | ICD-10-CM | POA: Diagnosis not present

## 2022-02-24 DIAGNOSIS — R55 Syncope and collapse: Secondary | ICD-10-CM

## 2022-02-24 DIAGNOSIS — E871 Hypo-osmolality and hyponatremia: Secondary | ICD-10-CM | POA: Diagnosis present

## 2022-02-24 DIAGNOSIS — Z853 Personal history of malignant neoplasm of breast: Secondary | ICD-10-CM | POA: Diagnosis not present

## 2022-02-24 DIAGNOSIS — G9340 Encephalopathy, unspecified: Secondary | ICD-10-CM | POA: Diagnosis present

## 2022-02-24 DIAGNOSIS — G629 Polyneuropathy, unspecified: Secondary | ICD-10-CM | POA: Diagnosis present

## 2022-02-24 DIAGNOSIS — K219 Gastro-esophageal reflux disease without esophagitis: Secondary | ICD-10-CM | POA: Diagnosis present

## 2022-02-24 DIAGNOSIS — I110 Hypertensive heart disease with heart failure: Secondary | ICD-10-CM | POA: Diagnosis present

## 2022-02-24 DIAGNOSIS — I951 Orthostatic hypotension: Secondary | ICD-10-CM

## 2022-02-24 DIAGNOSIS — Y92009 Unspecified place in unspecified non-institutional (private) residence as the place of occurrence of the external cause: Secondary | ICD-10-CM | POA: Diagnosis not present

## 2022-02-24 DIAGNOSIS — Z9012 Acquired absence of left breast and nipple: Secondary | ICD-10-CM | POA: Diagnosis not present

## 2022-02-24 DIAGNOSIS — I739 Peripheral vascular disease, unspecified: Secondary | ICD-10-CM | POA: Diagnosis present

## 2022-02-24 DIAGNOSIS — I10 Essential (primary) hypertension: Secondary | ICD-10-CM | POA: Diagnosis not present

## 2022-02-24 DIAGNOSIS — E876 Hypokalemia: Secondary | ICD-10-CM | POA: Diagnosis not present

## 2022-02-24 DIAGNOSIS — C7931 Secondary malignant neoplasm of brain: Secondary | ICD-10-CM | POA: Diagnosis present

## 2022-02-24 LAB — COMPREHENSIVE METABOLIC PANEL
ALT: 22 U/L (ref 0–44)
AST: 53 U/L — ABNORMAL HIGH (ref 15–41)
Albumin: 3 g/dL — ABNORMAL LOW (ref 3.5–5.0)
Alkaline Phosphatase: 314 U/L — ABNORMAL HIGH (ref 38–126)
Anion gap: 11 (ref 5–15)
BUN: 12 mg/dL (ref 8–23)
CO2: 25 mmol/L (ref 22–32)
Calcium: 9.8 mg/dL (ref 8.9–10.3)
Chloride: 97 mmol/L — ABNORMAL LOW (ref 98–111)
Creatinine, Ser: 0.61 mg/dL (ref 0.44–1.00)
GFR, Estimated: >60 mL/min (ref >60)
Glucose, Bld: 130 mg/dL — ABNORMAL HIGH (ref 70–99)
Potassium: 3.3 mmol/L — ABNORMAL LOW (ref 3.5–5.1)
Sodium: 133 mmol/L — ABNORMAL LOW (ref 135–145)
Total Bilirubin: 0.9 mg/dL (ref 0.3–1.2)
Total Protein: 7.3 g/dL (ref 6.5–8.1)

## 2022-02-24 LAB — CBC WITH DIFFERENTIAL/PLATELET
Abs Immature Granulocytes: 0.05 10*3/uL (ref 0.00–0.07)
Basophils Absolute: 0.1 10*3/uL (ref 0.0–0.1)
Basophils Relative: 1 %
Eosinophils Absolute: 0.3 10*3/uL (ref 0.0–0.5)
Eosinophils Relative: 4 %
HCT: 40.7 % (ref 36.0–46.0)
Hemoglobin: 13.4 g/dL (ref 12.0–15.0)
Immature Granulocytes: 1 %
Lymphocytes Relative: 33 %
Lymphs Abs: 3.1 10*3/uL (ref 0.7–4.0)
MCH: 29.1 pg (ref 26.0–34.0)
MCHC: 32.9 g/dL (ref 30.0–36.0)
MCV: 88.5 fL (ref 80.0–100.0)
Monocytes Absolute: 1.1 10*3/uL — ABNORMAL HIGH (ref 0.1–1.0)
Monocytes Relative: 11 %
Neutro Abs: 4.8 10*3/uL (ref 1.7–7.7)
Neutrophils Relative %: 50 %
Platelets: 479 10*3/uL — ABNORMAL HIGH (ref 150–400)
RBC: 4.6 MIL/uL (ref 3.87–5.11)
RDW: 16.1 % — ABNORMAL HIGH (ref 11.5–15.5)
WBC: 9.5 10*3/uL (ref 4.0–10.5)
nRBC: 0 % (ref 0.0–0.2)

## 2022-02-24 LAB — SARS CORONAVIRUS 2 BY RT PCR: SARS Coronavirus 2 by RT PCR: NEGATIVE

## 2022-02-24 LAB — MAGNESIUM: Magnesium: 2.2 mg/dL (ref 1.7–2.4)

## 2022-02-24 LAB — TSH: TSH: 3.798 u[IU]/mL (ref 0.350–4.500)

## 2022-02-24 LAB — VITAMIN B12: Vitamin B-12: 1171 pg/mL — ABNORMAL HIGH (ref 180–914)

## 2022-02-24 MED ORDER — POLYETHYLENE GLYCOL 3350 17 G PO PACK: 17.0000 g | PACK | Freq: Every day | ORAL | Status: DC

## 2022-02-24 MED ORDER — LACTATED RINGERS IV SOLN: INTRAVENOUS | Status: AC

## 2022-02-24 MED ORDER — ACETAMINOPHEN-CODEINE 300-30 MG PO TABS
1.0000 | ORAL_TABLET | Freq: Four times a day (QID) | ORAL | Status: DC | PRN
  Administered 2022-02-24 – 2022-02-26 (×2): 1 via ORAL
  Filled 2022-02-24 (×2): qty 1
  Filled 2022-02-24: qty 2

## 2022-02-24 MED ORDER — MILNACIPRAN HCL 50 MG PO TABS
50.0000 mg | ORAL_TABLET | Freq: Two times a day (BID) | ORAL | Status: DC
  Administered 2022-02-24 – 2022-02-26 (×6): 50 mg via ORAL
  Filled 2022-02-24 (×8): qty 1

## 2022-02-24 MED ORDER — ACETAMINOPHEN-CODEINE 300-30 MG PO TABS
1.0000 | ORAL_TABLET | Freq: Every day | ORAL | Status: DC
  Administered 2022-02-24 – 2022-02-26 (×3): 1 via ORAL
  Filled 2022-02-24 (×3): qty 1

## 2022-02-24 MED ORDER — ONDANSETRON HCL 4 MG/2ML IJ SOLN: 4.0000 mg | Freq: Four times a day (QID) | INTRAMUSCULAR | Status: DC | PRN

## 2022-02-24 MED ORDER — ACETAMINOPHEN 325 MG PO TABS: 650.0000 mg | ORAL_TABLET | Freq: Four times a day (QID) | ORAL | Status: DC | PRN

## 2022-02-24 MED ORDER — IBUPROFEN 200 MG PO TABS
400.0000 mg | ORAL_TABLET | Freq: Three times a day (TID) | ORAL | Status: DC
  Administered 2022-02-25 – 2022-02-26 (×6): 400 mg via ORAL
  Filled 2022-02-24 (×6): qty 2

## 2022-02-24 MED ORDER — PANTOPRAZOLE SODIUM 40 MG PO TBEC
40.0000 mg | DELAYED_RELEASE_TABLET | Freq: Every day | ORAL | Status: DC
  Administered 2022-02-24 – 2022-02-26 (×3): 40 mg via ORAL
  Filled 2022-02-24 (×3): qty 1

## 2022-02-24 MED ORDER — POLYETHYLENE GLYCOL 3350 17 G PO PACK
17.0000 g | PACK | Freq: Every day | ORAL | Status: DC | PRN
  Administered 2022-02-24: 17 g via ORAL
  Filled 2022-02-24: qty 1

## 2022-02-24 MED ORDER — CARVEDILOL 12.5 MG PO TABS
12.5000 mg | ORAL_TABLET | Freq: Two times a day (BID) | ORAL | Status: DC
  Administered 2022-02-24 – 2022-02-26 (×6): 12.5 mg via ORAL
  Filled 2022-02-24 (×6): qty 1

## 2022-02-24 MED ORDER — ENSURE ENLIVE PO LIQD
237.0000 mL | Freq: Two times a day (BID) | ORAL | Status: DC
  Administered 2022-02-24 (×2): 237 mL via ORAL

## 2022-02-24 MED ORDER — MILNACIPRAN HCL 50 MG PO TABS
50.0000 mg | ORAL_TABLET | Freq: Two times a day (BID) | ORAL | Status: DC
Start: 2022-02-24 — End: 2022-02-24
  Filled 2022-02-24 (×2): qty 1

## 2022-02-24 MED ORDER — ENOXAPARIN SODIUM 40 MG/0.4ML IJ SOSY
40.0000 mg | PREFILLED_SYRINGE | INTRAMUSCULAR | Status: DC
  Administered 2022-02-24 – 2022-02-25 (×2): 40 mg via SUBCUTANEOUS
  Filled 2022-02-24 (×3): qty 0.4

## 2022-02-24 MED ORDER — ASPIRIN 81 MG PO CHEW
81.0000 mg | CHEWABLE_TABLET | Freq: Every day | ORAL | Status: DC
  Administered 2022-02-24 – 2022-02-26 (×3): 81 mg via ORAL
  Filled 2022-02-24 (×3): qty 1

## 2022-02-24 MED ORDER — GADOBUTROL 1 MMOL/ML IV SOLN
7.0000 mL | Freq: Once | INTRAVENOUS | Status: AC | PRN
  Administered 2022-02-24: 7 mL via INTRAVENOUS

## 2022-02-24 MED ORDER — ONDANSETRON HCL 4 MG PO TABS: 4.0000 mg | ORAL_TABLET | Freq: Four times a day (QID) | ORAL | Status: DC | PRN

## 2022-02-24 MED ORDER — IRBESARTAN 150 MG PO TABS
150.0000 mg | ORAL_TABLET | Freq: Every day | ORAL | Status: DC
  Administered 2022-02-24 – 2022-02-26 (×3): 150 mg via ORAL
  Filled 2022-02-24 (×3): qty 1

## 2022-02-24 MED ORDER — LIP MEDEX EX OINT
TOPICAL_OINTMENT | CUTANEOUS | Status: DC | PRN
  Administered 2022-02-24: 75 via TOPICAL
  Filled 2022-02-24: qty 7

## 2022-02-24 MED ORDER — PYRIDOSTIGMINE BROMIDE 60 MG PO TABS
30.0000 mg | ORAL_TABLET | Freq: Two times a day (BID) | ORAL | Status: DC
Start: 2022-02-24 — End: 2022-02-27
  Administered 2022-02-24 – 2022-02-26 (×6): 30 mg via ORAL
  Filled 2022-02-24 (×7): qty 0.5

## 2022-02-24 MED ORDER — HYDRALAZINE HCL 20 MG/ML IJ SOLN: 10.0000 mg | Freq: Four times a day (QID) | INTRAMUSCULAR | Status: DC | PRN

## 2022-02-24 MED ORDER — ACETAMINOPHEN 650 MG RE SUPP: 650.0000 mg | Freq: Four times a day (QID) | RECTAL | Status: DC | PRN

## 2022-02-24 MED ORDER — LETROZOLE 2.5 MG PO TABS
2.5000 mg | ORAL_TABLET | Freq: Every day | ORAL | Status: DC
  Administered 2022-02-24 – 2022-02-26 (×3): 2.5 mg via ORAL
  Filled 2022-02-24 (×3): qty 1

## 2022-02-24 MED ORDER — SENNOSIDES-DOCUSATE SODIUM 8.6-50 MG PO TABS
1.0000 | ORAL_TABLET | Freq: Every day | ORAL | Status: DC
  Administered 2022-02-24: 1 via ORAL
  Filled 2022-02-24: qty 1

## 2022-02-24 MED ORDER — POTASSIUM CHLORIDE CRYS ER 20 MEQ PO TBCR
40.0000 meq | EXTENDED_RELEASE_TABLET | Freq: Once | ORAL | Status: AC
  Administered 2022-02-24: 40 meq via ORAL
  Filled 2022-02-24: qty 2

## 2022-02-24 MED ORDER — AMLODIPINE BESYLATE 5 MG PO TABS
2.5000 mg | ORAL_TABLET | Freq: Every day | ORAL | Status: DC
  Administered 2022-02-24 – 2022-02-26 (×3): 2.5 mg via ORAL
  Filled 2022-02-24 (×3): qty 1
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
## 2022-02-24 NOTE — Assessment & Plan Note (Addendum)
   Patient presenting with longstanding history of progressive weakness with intermittent bouts of transient confusion that seems to have become particularly severe in the past several weeks  Working up patient for any evidence of reversible causes of encephalopathy/weakness  Obtaining RPR, TSH, urinalysis, CT head, ammonia, electrolytes  I have reviewed the MRI of the patient's brain with radiology.  Patient appears to have a dural based mass with a small amount of mass effect but without edema.  There are air in numerable mets to the calvarium and some abnormal signal enhancement of the cervical spine.    Overall I feel that the cause of the patient's decline is likely multifactorial secondary to patient's progressive metastatic malignancy in the setting of polyneuropathy and progressive deconditioning  We will additionally review MRI imaging with neurology, order MRI imaging of the spine as well.  Patient's overall long-term prognosis is guarded to poor  Evaluating for any clinical evidence of underlying infection  Providing patient with gentle intravenous hydration  PT evaluation

## 2022-02-24 NOTE — Procedures (Signed)
Patient Name: Amy Kline  MRN: [id number]  Epilepsy Attending: Lora Havens  Referring Physician/Provider: Vernelle Emerald, MD  Date: 02/24/2022  Duration: 24.25 mins  Patient history: 88yo F with syncope. EEG to evaluate for seizure.   Level of alertness: Awake, drowsy  AEDs during EEG study: None  Technical aspects: This EEG study was done with scalp electrodes positioned according to the 10-20 International system of electrode placement. Electrical activity was reviewed with band pass filter of 1-'70Hz'$ , sensitivity of 7 uV/mm, display speed of 63m/sec with a '60Hz'$  notched filter applied as appropriate. EEG data were recorded continuously and digitally stored.  Video monitoring was available and reviewed as appropriate.  Description: The posterior dominant rhythm consists of 8 Hz activity of moderate voltage (25-35 uV) seen predominantly in posterior head regions, symmetric and reactive to eye opening and eye closing. Drowsiness was characterized by attenuation of the posterior background rhythm. EEG showed intermittent generalized 3 to 6 Hz theta-delta slowing. Hyperventilation and photic stimulation were not performed.     ABNORMALITY - Intermittent slow, generalized  IMPRESSION: This study is suggestive of mild diffuse encephalopathy, nonspecific etiology. No seizures or epileptiform discharges were seen throughout the recording.  Aarna Mihalko OBarbra Sarks

## 2022-02-24 NOTE — Assessment & Plan Note (Signed)
   Progressively worsening falls due to worsening weakness and deconditioning, discussion concerning etiology of this as above  Patient additionally experiencing episodes of loss of consciousness, possibly secondary to bouts of orthostatic syncope considering patient's history of orthostatic hypotension  CT trauma survey in the emergency department reveals no evidence of bony injury but does reveal evidence of diffuse osseous metastasis throughout the spine  PT evaluation

## 2022-02-24 NOTE — Consult Note (Cosign Needed Addendum)
Palliative Medicine Inpatient Consult Note  Consulting Provider: Dessa Phi, DO  Reason for consult:   Palliative Care Consult Services Symptom Management Consult  Reason for Consult? hospice, metastatic cancer   Metastatic breast cancer, progressing. Dr. [name] (oncology) spoke with daughters and they're interested in pursuing home hospice. Will need symptom management going forward.    02/24/2022  HPI:  Per intake H&P --> Patient is a 88 year old female with past medical history significant for left breast cancer diagnosed in 2013 with recurrence in 2022.  She is currently followed by Dr. [name], oncologist in [place].  She presented to the hospital 8/14 due to worsening weakness and intermittent altered mental status.  Palliative care has been asked to get involved in the setting of metastatic breast cancer which appears to be worsening for further conversations about hospice care and CODE STATUS.   Clinical Assessment/Goals of Care:  *Please note that this is a verbal dictation therefore any spelling or grammatical errors are due to the "[place] One" system interpretation.  I have reviewed medical records including EPIC notes, labs and imaging, received report from bedside RN, assessed the patient who is getting an EEG at the time of assessment resting comfortably in bed.  She was not in distress and her husband asked that we excuse herself for further conversations after briefly vocalizing that I was on the palliative care team.  ___________________ The following conversations were held outside of the patient's room in the conference room:   I met with [place] 3 daughters and husband this evening to further discuss diagnosis prognosis, GOC, EOL wishes, disposition and options.   I introduced Palliative Medicine as specialized medical care for people living with serious illness. It focuses on providing relief from the symptoms and stress of a serious illness. The goal  is to improve quality of life for both the patient and the family.  Medical History Review and Understanding:  A review of jewels past medical history was held in the setting of her breast cancer, hypertension, fibromyalgia, GERD, and peripheral vascular disease.  Social History:  Song lives in [place], [place].  She has been married to her husband Gwyndolyn Saxon for the past 45 years.  They share 3 daughters, 8 grandchildren, and 4 great-grandchildren together.  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  Functional and Nutritional State:  Patient's husband was her primary caregiver for all basic activities of daily living prior to admission.  Patient does have a fairly good appetite.  Advance Directives: A detailed discussion was had today regarding advanced directives. Patient's husband is her Ambulance person.  Code Status:  Provided  "Hard Choices for Loving People" booklet.  To patient's family for further review.  Given that all of the information along receiving was relatively recent we determined that further conversations on cardiopulmonary resuscitation status would happen tomorrow.  Discussion:  Patient's daughters had a long detailed conversation with Jackson Latino primary oncologist,  Dr. [name] which you may have a relationship.  Unfortunately given patient's present disease state and constellation of symptoms it was recommended that family consider hospice.  We will meeting with patient's spouse and 3 daughters they were very overwhelmed and had not told this information to [facility] yet.   The differences between palliative care and hospice  were described.  Patient's family was overwhelmed while discussing what a wonderful mother she was.  We reviewed that  additional conversations could be held tomorrow after patient's family was able to speak to [facility] more about what Dr. [name] had vocalized.  They are very clear that they do not want their mother to know any timeline regarding to possible passing.  Discussed the importance of continued conversation with family and their  medical providers regarding overall plan of care and treatment options, ensuring decisions are within the context of the patients values and GOCs. ___________________ I met with [name] at bedside to discuss the symptoms that are most burdensome to her as below:  Palliative Symptoms:  Patient experiences pain in her lower back which radiates down to her legs.  Upon conversation with Jackson Latino she shares that the pain is constantly present though she does get relief with Tylenol 3.  Sharee Pimple vocalizes that she does not like to take such "strong" pain medication unless she really needs it.  Gwyndolyn Saxon shares that at home she was also getting ibuprofen 600 mg which helped with the pain as well.  Patient is pleasant and we reviewed that her family would be in shortly to speak with her.  Decision Maker: Abernathy,William Spouse [phone]   [phone]     SUMMARY OF RECOMMENDATIONS   FULL CODE for the time being --> additional conversations will be held tomorrow  Patient's family have all spoken with Dr. [name] --> though they want time to digest the information they were told and then they would like to speak to [place] privately prior to the palliative team getting further involved in conversations  Family would like to have another meeting tomorrow around 2:00  --> though they will require a call in advance to verify they can all be present  Appreciate hospice of the [place] involvement for further conversations --> Cheri kennedy aware family vocalizes a strong desire for no timelines to be presented  Ongoing palliative care support --> I will not be present tomorrow though will  request a colleague follow up  Symptom management as below  Code Status/Advance Care Planning: FULL CODE  Symptom Management:  Lower back pain radiating to the legs: -Patient for the time being is agreeable to continuing Tylenol 3 we determined that we would schedule it nightly and possibly 3 times daily in the future -Ibuprofen 400 mg 3 times daily scheduled  Constipation prophylaxis: -MiraLAX 17 g p.o. daily -Senna 1 tab p.o. nightly  Palliative Prophylaxis:  Aspiration, Bowel Regimen, Delirium Protocol, Frequent Pain Assessment, Oral Care, Palliative Wound Care, and Turn Reposition  Additional Recommendations (Limitations, Scope, Preferences): Continue current care  Psycho-social/Spiritual:  Desire for further Chaplaincy support: Alferd Apa faithful Additional Recommendations: Education on complex disease logical process   Prognosis: Hospice is an appropriate consideration less than 6 months  Discharge Planning: Discharge likely home with hospice if medically optimized   Vitals:   02/24/22 0803 02/24/22 1232  BP: 139/70 (!) 121/59  Pulse: 80 79  Resp: 16 16  Temp: 97.8 F (36.6 C) 98.3 F (36.8 C)  SpO2: 97% 97%    Intake/Output Summary (Last 24 hours) at 02/24/2022 1712 Last data filed at 02/24/2022 1549 Gross per 24 hour  Intake 2467.66 ml  Output 1150 ml  Net 1317.66 ml   Last Weight  Most recent update: 02/24/2022 12:17 AM    Weight  69.2 kg (152 lb 8 oz)            Gen: Elderly Caucasian  woman in no acute distress HEENT: moist mucous membranes CV: Regular rate and rhythm PULM: On room air breathing is even and nonlabored ABD: soft/nontender EXT: No edema Neuro: Alert and oriented x2-3  PPS: 40%   This conversation/these recommendations were discussed with patient primary care team, Dr. Maylene Roes  Billing based on MDM: High  Problems Addressed: One acute or chronic illness or injury that poses a threat to life or bodily function  Amount and/or  Complexity of Data: Category 3:Discussion of management or test interpretation with external physician/other qualified health care professional/appropriate source (not separately reported)  Risks: Decision regarding hospitalization or escalation of hospital care and Decision not to resuscitate or to de-escalate care because of poor prognosis ______________________________________________________ [place] Team Team Cell Phone: [phone] Please utilize secure chat with additional questions, if there is no response within 30 minutes please call the above phone number  Palliative Medicine Team providers are available by phone from 7am to 7pm daily and can be reached through the team cell phone.  Should this patient require assistance outside of these hours, please call the patient's attending physician.

## 2022-02-24 NOTE — TOC Initial Note (Signed)
Transition of Care [facility]) - Initial/Assessment Note    Patient Details  Name: Amy Kline MRN: [id number] Date of Birth: 28-Jul-1929  Transition of Care [facility]) CM/SW Contact:    Leeroy Cha, RN Phone Number: 02/24/2022, 9:10 AM  Clinical Narrative:                  Transition of Care (TOC) Screening Note   Patient Details  Name: Amy Kline Date of Birth: 01/31/1930   Transition of Care [facility]) CM/SW Contact:    Leeroy Cha, RN Phone Number: 02/24/2022, 9:10 AM    Transition of Care Department [facility]) has reviewed patient and no TOC needs have been identified at this time. We will continue to monitor patient advancement through interdisciplinary progression rounds. If new patient transition needs arise, please place a TOC consult.    Expected Discharge Plan: Home w Hospice Care Barriers to Discharge: Continued Medical Work up   Patient Goals and CMS Choice Patient states their goals for this hospitalization and ongoing recovery are:: to go home   Choice offered to / list presented to : Spouse, Patient  Expected Discharge Plan and Services Expected Discharge Plan: [place]   Discharge Planning Services: CM Consult   Living arrangements for the past 2 months: Single Family Home                                      Prior Living Arrangements/Services Living arrangements for the past 2 months: Single Family Home Lives with:: Spouse Patient language and need for interpreter reviewed:: Yes Do you feel safe going back to the place where you live?: Yes               Activities of Daily Living Home Assistive Devices/Equipment: Shower chair without back, CPAP (patient does not use CPAP) ADL Screening (condition at time of admission) Patient's cognitive ability adequate to safely complete daily activities?: No Is the patient deaf or have difficulty hearing?: Yes Does the patient have difficulty seeing, even when wearing  glasses/contacts?: No Does the patient have difficulty concentrating, remembering, or making decisions?: Yes Patient able to express need for assistance with ADLs?: Yes Does the patient have difficulty dressing or bathing?: Yes Independently performs ADLs?: No Communication: Independent Dressing (OT): Needs assistance Grooming: Needs assistance Feeding: Independent Bathing: Needs assistance Toileting: Needs assistance In/Out Bed: Needs assistance Walks in Home: Needs assistance Does the patient have difficulty walking or climbing stairs?: Yes Weakness of Legs: Both Weakness of Arms/Hands: Both  Permission Sought/Granted                  Emotional Assessment Appearance:: Appears stated age Attitude/Demeanor/Rapport: Engaged Affect (typically observed): Calm Orientation: : Oriented to Place, Oriented to Self, Oriented to Situation Alcohol / Substance Use: Never Used Psych Involvement: No (comment)  Admission diagnosis:  Acute metabolic encephalopathy [P29.51] Patient Active Problem List   Diagnosis Date Noted   Fall at home, initial encounter 02/24/2022   Orthostatic syncope 02/24/2022   Chronic diastolic CHF (congestive heart failure) ([place]) 02/24/2022   Breast cancer ([place]) 02/24/2022   Generalized weakness 02/23/2022   Fatigue 06/17/2021   Pimples 06/17/2021   Pneumonia 06/17/2021   Sinus problem 06/17/2021   Osteoporosis 10/16/2020    Class: Diagnosis of   Lumbar radiculopathy 10/09/2020   Metastasis to bone ([place]) 07/03/2020   Episode of loss of consciousness 05/14/2020  History of cerebellar stroke 05/14/2020   Gait disturbance 05/14/2020   Polyneuropathy 05/14/2020   Pancreatic lesion 04/29/2020   Coronary artery calcification seen on CAT scan 03/12/2020   Pulmonary fibrosis ([place]) 03/08/2020   Serum calcium elevated    Peripheral vascular disease (HCC)    Parathyroid cyst (HCC)    Light headedness    Kidney cysts    Incontinence    Hypertension     Hoarseness of voice    Heart palpitations    Heart murmur    Bruise    Back pain    Elevated alkaline phosphatase level 02/15/2020   Dizziness 02/07/2020   Recurrent UTI 05/23/2018   H/O splenectomy 04/18/2018   Decreased vascular flow 04/01/2018   Senile purpura ([place]) 11/17/2017   Vitamin D deficiency 11/17/2017   Vertigo, intermittent 12/28/2016   Hoarse voice quality 08/31/2016   Vitamin B12 deficiency 05/07/2016   Mild cognitive impairment 05/05/2016   Goals of care, counseling/discussion 12/19/2015   Kidney mass 10/29/2015   Pelvic mass in female 10/29/2015   Allergic rhinitis 10/22/2015   Arrhythmia 10/22/2015   Cardiomyopathy ([place]) 10/22/2015   Degeneration of lumbar intervertebral disc 10/22/2015   Hearing loss 10/22/2015   High risk medication use 10/22/2015   History of transient ischemic attack (TIA) 10/22/2015   Hyperparathyroidism ([place]) 10/22/2015   Malaise and fatigue 10/22/2015   Mixed hyperlipidemia 10/22/2015   Breast cancer of upper-inner quadrant of left female breast ([place]) 05/30/2013   Essential hypertension 11/06/2012   Cystocele 02/15/2012   GERD without esophagitis    Sleep apnea    Fibromyalgia    Hot flashes    Osteopenia    Papilloma of breast    Osteoarthritis    PCP:  Raina Mina., MD Pharmacy:   [facility] [place], [place] [facility] [address] [place] [address] Phone: [phone] Fax: [phone]  [facility] [phone] - [place], [place] - [address] AT [place] [address] [place] [address] Phone: [phone] Fax: [phone]  [facility] ([facility]) - [place], [place] [address] [place] [address] Phone: [phone] Fax: [phone]     Social Determinants of Health (SDOH) Interventions    Readmission Risk Interventions     No data to  display

## 2022-02-24 NOTE — Progress Notes (Signed)
PT Cancellation Note  Patient Details Name: Amy Kline MRN: [id number] DOB: 1929/09/05   Cancelled Treatment:    Reason Eval/Treat Not Completed: Other (comment) (Family is visiting with pt. Pt/family awaiting meeting with pallitive team and oncology. Will follow up as schedule allows if pt remains appropriate.)   Verner Mould, DPT Acute Rehabilitation Services Office [phone] Pager [phone]  02/24/22 2:05 PM

## 2022-02-24 NOTE — Assessment & Plan Note (Addendum)
   Known history of orthostatic hypotension which is being managed by patient's neurologist with pyridostigmine  Therefore it is possible that the patient's recent episodes of loss of consciousness may be secondary to orthostatic syncope  Alternatively considering the patient has a dural based mass on MRI, patient could be having seizure activity although this is less likely  Obtaining EEG  Gentle intravenous hydration  Fall precautions  Monitoring patient on telemetry  Cardiac enzymes obtained and unremarkable  Relaxed blood pressure targets

## 2022-02-24 NOTE — Progress Notes (Signed)
EEG complete - results pending 

## 2022-02-24 NOTE — Assessment & Plan Note (Signed)
   Continue outpatient regimen of Savella

## 2022-02-24 NOTE — Assessment & Plan Note (Addendum)
.   No clinical evidence of cardiogenic volume overload . Temporarily holding home regimen of Lasix . Strict input and output monitoring . Daily weights . Low-sodium diet

## 2022-02-24 NOTE — Assessment & Plan Note (Signed)
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 

## 2022-02-24 NOTE — Assessment & Plan Note (Signed)
Continuing home regimen of daily PPI therapy.  

## 2022-02-24 NOTE — Progress Notes (Signed)
  PROGRESS NOTE  Patient admitted earlier this morning. See H&P.   Patient is a 88 year old female with past medical history significant for left breast cancer diagnosed in 2013 with recurrence in 2022.  She is currently followed by Dr. [name], oncologist in [place].  She presented to the hospital 8/14 due to worsening weakness and intermittent altered mental status.  Patient examined with daughter at bedside.  Patient's mentation has been slowly declining since cancer recurrence in 2022.  She has become increasingly weak, family has been having a hard time caring for her at home.  Imaging revealed metastasis.  EEG to rule out seizures Oncology consulted.  Pending discussion, would recommend palliative care consultation if family is agreeable.  They wanted to hold off until they could talk with oncology. Replace potassium   Status is: Observation The patient will require care spanning > 2 midnights and should be moved to inpatient because: Patient remains very weak.  Oncology consulted and EEG is pending.   Dessa Phi, DO [facility] 02/24/2022, 1:15 PM  Available via Epic secure chat 7am-7pm After these hours, please refer to coverage provider listed on [email or web address]

## 2022-02-24 NOTE — Assessment & Plan Note (Addendum)
.   Resume patients home regimen of oral antihypertensives . Relaxed blood pressure targets considering history of orthostatic hypotension and reported recent falls/possibility of syncope . PRN intravenous antihypertensives for excessively elevated blood pressure

## 2022-02-25 DIAGNOSIS — Z515 Encounter for palliative care: Secondary | ICD-10-CM | POA: Diagnosis not present

## 2022-02-25 DIAGNOSIS — R531 Weakness: Secondary | ICD-10-CM | POA: Diagnosis not present

## 2022-02-25 DIAGNOSIS — C7951 Secondary malignant neoplasm of bone: Secondary | ICD-10-CM

## 2022-02-25 DIAGNOSIS — C50919 Malignant neoplasm of unspecified site of unspecified female breast: Secondary | ICD-10-CM

## 2022-02-25 DIAGNOSIS — Z7189 Other specified counseling: Secondary | ICD-10-CM | POA: Diagnosis not present

## 2022-02-25 DIAGNOSIS — I10 Essential (primary) hypertension: Secondary | ICD-10-CM | POA: Diagnosis not present

## 2022-02-25 LAB — BASIC METABOLIC PANEL
Anion gap: 6 (ref 5–15)
BUN: 16 mg/dL (ref 8–23)
CO2: 25 mmol/L (ref 22–32)
Calcium: 9.2 mg/dL (ref 8.9–10.3)
Chloride: 104 mmol/L (ref 98–111)
Creatinine, Ser: 0.6 mg/dL (ref 0.44–1.00)
GFR, Estimated: >60 mL/min (ref >60)
Glucose, Bld: 111 mg/dL — ABNORMAL HIGH (ref 70–99)
Potassium: 4.1 mmol/L (ref 3.5–5.1)
Sodium: 135 mmol/L (ref 135–145)

## 2022-02-25 LAB — MAGNESIUM: Magnesium: 2.1 mg/dL (ref 1.7–2.4)

## 2022-02-25 LAB — RPR: RPR Ser Ql: NONREACTIVE

## 2022-02-25 MED ORDER — FLEET ENEMA 7-19 GM/118ML RE ENEM: 1.0000 | ENEMA | Freq: Every day | RECTAL | Status: DC | PRN

## 2022-02-25 MED ORDER — SENNOSIDES-DOCUSATE SODIUM 8.6-50 MG PO TABS
2.0000 | ORAL_TABLET | Freq: Two times a day (BID) | ORAL | Status: DC
  Administered 2022-02-25 – 2022-02-26 (×4): 2 via ORAL
  Filled 2022-02-25 (×4): qty 2

## 2022-02-25 MED ORDER — POLYETHYLENE GLYCOL 3350 17 G PO PACK
17.0000 g | PACK | Freq: Two times a day (BID) | ORAL | Status: DC
  Administered 2022-02-25 – 2022-02-26 (×3): 17 g via ORAL
  Filled 2022-02-25 (×4): qty 1

## 2022-02-25 NOTE — Progress Notes (Signed)
Initial Nutrition Assessment  DOCUMENTATION CODES:   Not applicable  INTERVENTION:  - continue Ensure Plus High Protein BID, each supplement provides 350 kcal and 20 grams of protein.  - liberalize diet from Heart Healthy to Regular.   NUTRITION DIAGNOSIS:   Increased nutrient needs related to acute illness, chronic illness, cancer and cancer related treatments as evidenced by estimated needs.  GOAL:   Patient will meet greater than or equal to 90% of their needs  MONITOR:   PO intake, Supplement acceptance, Labs, Weight trends  REASON FOR ASSESSMENT:   Malnutrition Screening Tool  ASSESSMENT:   88 year old female with medical history of L breast cancer diagnosed in 2013 s/p L mastectomy at that time with recurrence in late 2021 with identified evidence of extension into the L axillary regional lymph nodes and bony mets, CHF, HTN, polyneuropathy, GERD, and fibromyalgia. She presented to the ED due to progressively worsening weakness and mild confusion. She was admitted due to generalized weakness.  Patient sitting up in bed and her husband was at bedside. Palliative Care met with husband and 3 daughters yesterday and husband shares plan for meeting again this afternoon.   Flow sheet documentation indicates patient ate 100% of breakfast yesterday and 50% of breakfast today. Husband shares that patient's breakfast was cold and that 2 of the 3 pancakes were tough/hard.   Patient is noted to be a very good cook; husband re-iterates this and shares that he has been attempting to cook things as patient used to and that he intends to go to a session offered at the [employer] on Tuesday afternoons where the RD will provide recipes and answer questions in a class-like setting in the lobby.  Patient and husband deny any nutrition-related questions, concerns, or need for support currently. Let them know of plan to liberalize to Regular diet.   Ensure ordered BID and patient  has accepted both bottles offered.  Weight yesterday was 152 lb, weight on 7/19 was 154 lb, and weight on 6/12 was 160 lb. This indicates 8 lb weight loss (5% body weight) in the past 2 months.   Labs reviewed. Medications reviewed; 40 mg oral protonix/day, 17 g miralax BID, 40 mEq Klor-Con x1 dose 8/15, 2 tablets senokot BID.    NUTRITION - FOCUSED PHYSICAL EXAM:  Flowsheet Row Most Recent Value  Orbital Region No depletion  Upper Arm Region No depletion  Thoracic and Lumbar Region Unable to assess  Buccal Region No depletion  Temple Region No depletion  Clavicle Bone Region No depletion  Clavicle and Acromion Bone Region No depletion  Scapular Bone Region No depletion  Dorsal Hand No depletion  Patellar Region Unable to assess  Anterior Thigh Region Unable to assess  Posterior Calf Region Unable to assess  Edema (RD Assessment) Unable to assess  Hair Reviewed  Eyes Reviewed  Mouth Reviewed  Skin Reviewed  Nails Reviewed       Diet Order:   Diet Order             Diet regular Room service appropriate? Yes; Fluid consistency: Thin  Diet effective now                   EDUCATION NEEDS:   No education needs have been identified at this time  Skin:  Skin Assessment: Reviewed RN Assessment  Last BM:  PTA/unknown  Height:   Ht Readings from Last 1 Encounters:  02/24/22 $RemoveB'5\' 5"'VOAfmZtw$  (1.651 m)    Weight:  Wt Readings from Last 1 Encounters:  02/24/22 69.2 kg     BMI:  Body mass index is 25.38 kg/m.  Estimated Nutritional Needs:  Kcal:  1600-1900 kcal Protein:  85-95 grams Fluid:  >/= 1.8 L/day     Jarome Matin, MS, RD, LDN, CNSC Registered Dietitian II Inpatient Clinical Nutrition RD pager # and on-call/weekend pager # available in [facility]

## 2022-02-25 NOTE — Progress Notes (Signed)
Palliative:  HPI: 88-year-old female with past medical history significant for left breast cancer diagnosed in 2013 with recurrence in 2022.  She is currently followed by Dr. McCarty, oncologist in [place].  She presented to the hospital 8/14 due to worsening weakness and intermittent altered mental status.Palliative care has been asked to get involved in the setting of metastatic breast cancer which appears to be worsening for further conversations about hospice care and CODE STATUS.   I met today with Immaculate's husband and three daughters prior to meeting with Jeroline. Cheri Kennedy with [facility] joined us and was able to assist with family questions regarding hospice support at home. We discussed path forward, goals of comfort focused care, hospice at home vs hospice facility. We discussed her cancer progression and how this will effect expectations and activity moving forward. We discussed progression of her cancer and how this has effected her pain and activity as well as personality. Husband shares his conversation with the physician earlier about her scans. They had many good questions about expectations of care at home and what to expect from hospice. They understand that sometimes people do go from home to hospice facility for care as well. I discussed with them code status and husband shares that they have had this discussion and that they would not desire resuscitation. Daughter, Marlo, confirms that she has heard her mother voice this opinion as well. Family voice concern that they do not want to be given a "timeline" of prognosis and I reassured them that I try to focus on how we help Declan to enjoy the time she has left - whatever time that may be - as the end works itself out when it is the right time.   I went to visit with Kehlani along with husband and children. She is resting in bed and complains of feeling very tired all the time. She was able to confirm desire for DNR to me and  family. She was able to share some thoughts with her family that she loves them all and that she is "ready to go." She shares her appreciation and love for all her family and they were able to reciprocate. She is at peace and looking forward to returning to her home.   All questions/concerns addressed. Emotional support provided.   Exam: Lying in bed. Good spirits. Fatigue and weakness. No distress. Breathing regular, unlabored.   Plan: - DNR decided.  - Home with hospice support.   120 min   , NP Palliative Medicine Team Pager [phone] (Please see [email or web address] for schedule) Team Phone [phone]    Greater than 50%  of this time was spent counseling and coordinating care related to the above assessment and plan   

## 2022-02-25 NOTE — Evaluation (Signed)
Physical Therapy Evaluation Patient Details Name: Amy Kline MRN: [id number] DOB: 1930/05/14 Today's Date: 02/25/2022  History of Present Illness  88 yo female presents with worsening weaknss. PMH: L breast cancer diagnosed 2013 s/p left mastectomy with recurrence in late 2021 with identified evidence of extension into the left axillary regional lymph nodes as well as bony metastase, CHF, HTN, GERD, orthostatic hypotension, polyneuropathy and fibromyalgia.  Clinical Impression  Pt admitted with above diagnosis. Pt requiring assistance at baseline per spouse, varies from hand held assist with limited steps from transport w/c to toilet to total A. Spouse reports steady decline over the past ~3-4 months with progressive weakening. Pt requiring max A to mobilize BLE over to EOB and upright trunk into sitting, increased time to minimize pain with mobility. Pt with strong posterior lean in sitting, requires max A to return to supine and reposition to comfort using bed pad. Spouse in room reports goal is to bring pt home, planning to have family conversation regarding palliative/hospice later today. Pt has w/c lift to enter/exit home, transport chair, adjustable bed. Would recommend hoyer lift and hospital bed if family plans to take pt home along with 24/7 assist. Plan to finalize DME recs and family education/training in acute care setting for discharge to home if that is the continued family plan. Pt currently with functional limitations due to the deficits listed below (see PT Problem List). Pt will benefit from skilled PT to increase their independence and safety with mobility to allow discharge to the venue listed below.          Recommendations for follow up therapy are one component of a multi-disciplinary discharge planning process, led by the attending physician.  Recommendations may be updated based on patient status, additional functional criteria and insurance authorization.  Follow Up  Recommendations No PT follow up      Assistance Recommended at Discharge Frequent or constant Supervision/Assistance  Patient can return home with the following  Two people to help with walking and/or transfers;Two people to help with bathing/dressing/bathroom;Assist for transportation;Assistance with cooking/housework    Equipment Recommendations Hospital bed;Other (comment) (hoyer lift)  Recommendations for Other Services       Functional Status Assessment Patient has had a recent decline in their functional status and demonstrates the ability to make significant improvements in function in a reasonable and predictable amount of time.     Precautions / Restrictions Precautions Precautions: Fall Restrictions Weight Bearing Restrictions: No      Mobility  Bed Mobility Overal bed mobility: Needs Assistance Bed Mobility: Supine to Sit, Sit to Supine  Supine to sit: Max assist Sit to supine: Max assist  General bed mobility comments: max A to mobilize BLE and manage trunk, cues for hand placement and sequencing, strong posterior lean once sitting requiring asssit to maintain static sitting EOB    Transfers  General transfer comment: not attempted with +1 assist    Ambulation/Gait     Stairs            Wheelchair Mobility    Modified Rankin (Stroke Patients Only)       Balance Overall balance assessment: Needs assistance Sitting-balance support: Feet supported, Bilateral upper extremity supported Sitting balance-Leahy Scale: Zero Sitting balance - Comments: max A to sit EOB, posterior lean Postural control: Posterior lean        Pertinent Vitals/Pain Pain Assessment Pain Assessment: Faces Faces Pain Scale: Hurts little more Pain Location: generalized with mobility Pain Descriptors / Indicators: Grimacing, Guarding Pain Intervention(s):  Limited activity within patient's tolerance, Monitored during session, Repositioned    Home Living Family/patient  expects to be discharged to:: Private residence Living Arrangements: Spouse/significant other Available Help at Discharge: Family;Available 24 hours/day Type of Home: House Home Access: Stairs to enter;Other (comment) (w/c lift)   Entrance Stairs-Number of Steps: 6   Home Layout: Able to live on main level with bedroom/bathroom Home Equipment: Rolling Walker (2 wheels);Transport chair;Other (comment) (adjustable bed)      Prior Function Prior Level of Function : Needs assist  Mobility Comments: spouse reports ~3-4 month weakening, varies between hand held assist and total assist for limited steps from transport w/c to toilet, using transport w/c for main mobility ADLs Comments: spouse assisting pt as needed     Hand Dominance        Extremity/Trunk Assessment   Upper Extremity Assessment Upper Extremity Assessment: Generalized weakness    Lower Extremity Assessment Lower Extremity Assessment: Generalized weakness    Cervical / Trunk Assessment Cervical / Trunk Assessment: Kyphotic  Communication   Communication: No difficulties  Cognition Arousal/Alertness: Awake/alert Behavior During Therapy: WFL for tasks assessed/performed Overall Cognitive Status: Within Functional Limits for tasks assessed         General Comments      Exercises     Assessment/Plan    PT Assessment Patient needs continued PT services  PT Problem List Decreased strength;Decreased activity tolerance;Decreased balance;Decreased mobility;Decreased knowledge of use of DME;Pain       PT Treatment Interventions DME instruction;Gait training;Functional mobility training;Patient/family education;Therapeutic activities;Therapeutic exercise;Balance training;Neuromuscular re-education    PT Goals (Current goals can be found in the Care Plan section)  Acute Rehab PT Goals Patient Stated Goal: spouse states goal is to bring pt home, pending palliative/hospice meeting PT Goal Formulation: With  patient/family Time For Goal Achievement: 03/11/22 Potential to Achieve Goals: Fair    Frequency Min 2X/week     Co-evaluation               AM-PAC PT "6 Clicks" Mobility  Outcome Measure Help needed turning from your back to your side while in a flat bed without using bedrails?: A Lot Help needed moving from lying on your back to sitting on the side of a flat bed without using bedrails?: A Lot Help needed moving to and from a bed to a chair (including a wheelchair)?: Total Help needed standing up from a chair using your arms (e.g., wheelchair or bedside chair)?: Total Help needed to walk in hospital room?: Total Help needed climbing 3-5 steps with a railing? : Total 6 Click Score: 8    End of Session   Activity Tolerance: Patient tolerated treatment well Patient left: in bed;with call bell/phone within reach;with bed alarm set;with family/visitor present Nurse Communication: Mobility status PT Visit Diagnosis: Other abnormalities of gait and mobility (R26.89);Muscle weakness (generalized) (M62.81);Difficulty in walking, not elsewhere classified (R26.2)    Time: 8127-5170 PT Time Calculation (min) (ACUTE ONLY): 41 min   Charges:   PT Evaluation $PT Eval Moderate Complexity: 1 Mod PT Treatments $Therapeutic Activity: 8-22 mins         Tori Gibson Lad PT, DPT 02/25/22, 1:04 PM

## 2022-02-25 NOTE — Plan of Care (Signed)
  Problem: Health Behavior/Discharge Planning: Goal: Ability to manage health-related needs will improve Outcome: Progressing   Problem: Clinical Measurements: Goal: Ability to maintain clinical measurements within normal limits will improve Outcome: Progressing Goal: Will remain free from infection Outcome: Progressing Goal: Diagnostic test results will improve Outcome: Progressing Goal: Respiratory complications will improve Outcome: Progressing Goal: Cardiovascular complication will be avoided Outcome: Progressing   Problem: Nutrition: Goal: Adequate nutrition will be maintained Outcome: Progressing   Problem: Coping: Goal: Level of anxiety will decrease Outcome: Progressing   Problem: Elimination: Goal: Will not experience complications related to bowel motility Outcome: Not Progressing Note: Currently on bowel regimen   Problem: Activity: Goal: Risk for activity intolerance will decrease Outcome: Adequate for Discharge   Problem: Education: Goal: Knowledge of General Education information will improve Description: Including pain rating scale, medication(s)/side effects and non-pharmacologic comfort measures Outcome: Completed/Met

## 2022-02-25 NOTE — Progress Notes (Signed)
[facility] PROGRESS NOTE   Amy Kline WER:[id number] DOB: 04/07/1930 DOA: 02/23/2022  PCP: Raina Mina., MD  Brief History/Interval Summary: 88 year old female with past history of left breast cancer diagnosed 2013 status post left mastectomy at that time with recurrence in late 2021 with identified evidence of extension into the left axillary regional lymph nodes as well as bony metastases.  Patient also has a history of diastolic congestive heart failure (Echo 01/2022 EF 60-65% with G1DD), hypertension, orthostatic hypotension (on Pyridostigmine), polyneuropathy, gastroesophageal reflux disease and fibromyalgia.  Patient has been brought into [facility] emergency department due to progressively worsening weakness with bouts of brief confusion.  It appears that patient has been gradually experiencing worsening generalized weakness since early 2022.  She was also found to have recurrent breast cancer with metastases.  Followed by medical oncology in [place].  Consultants: Palliative care  Procedures: None    Subjective/Interval History: Patient complains of generalized weakness.  Denies any headache.  No neck pain.  Complains of constipation.  Vomiting with some abdominal discomfort in the lower abdomen.     Assessment/Plan:  Generalized weakness No evidence of infection based on evaluation in the emergency department.  Her weakness is likely multifactorial but is mostly secondary to progressive metastatic malignancy.  Metastatic breast cancer Appears to have had a relapse.  MRI of the brain showed a dural based mass with some mass effect but without edema.  There are also mets in the calvarium and within the cervical spine. Patient followed by Dr. [name] at [place].  She spoke to the patient and the family over the phone yesterday.  She supports transition to hospice.  Palliative care has been consulted and they will be meeting with family this afternoon.   Disposition is unclear at this time.  Orthostatic hypotension Known history of same.  Managed by neurology in the outpatient setting with pyridostigmine.  EEG unremarkable for seizure activity.  Chronic diastolic CHF Holding furosemide.  Essential hypertension Noted to be on amlodipine, carvedilol, Avapro.  Monitor blood pressures closely.  Fibromyalgia Noted to be on [place].  GERD Continue PPI  Constipation Initiate bowel regimen.  TSH noted to be normal.  Abdomen is benign on examination.  Hyponatremia/hypokalemia Resolved this morning.    DVT Prophylaxis: Lovenox Code Status: Full code Family Communication: Discussed with patient and her husband Disposition Plan: To be determined  Status is: Inpatient Remains inpatient appropriate because: Progressive metastatic breast cancer      Medications: Scheduled:  acetaminophen-codeine  1 tablet Oral QHS   amLODipine  2.5 mg Oral Daily   aspirin  81 mg Oral Daily   carvedilol  12.5 mg Oral BID WC   enoxaparin (LOVENOX) injection  40 mg Subcutaneous Q24H   feeding supplement  237 mL Oral BID BM   ibuprofen  400 mg Oral TID   irbesartan  150 mg Oral Daily   letrozole  2.5 mg Oral Daily   Milnacipran  50 mg Oral BID   pantoprazole  40 mg Oral Daily   polyethylene glycol  17 g Oral BID   pyridostigmine  30 mg Oral BID   senna-docusate  2 tablet Oral BID   Continuous: PPJ:[id number] **OR** acetaminophen, acetaminophen-codeine, hydrALAZINE, lip balm, ondansetron **OR** ondansetron (ZOFRAN) IV, sodium phosphate  Antibiotics: Anti-infectives (From admission, onward)    None       Objective:  Vital Signs  Vitals:   02/24/22 1232 02/24/22 1751 02/24/22 2118 02/25/22 0553  BP: (!) 121/59 (!) 149/94  113/63 136/63  Pulse: 79 (!) 103 88 77  Resp: '16  18 16  '$ Temp: 98.3 F (36.8 C)  98.1 F (36.7 C) 98.2 F (36.8 C)  TempSrc: Oral  Oral Oral  SpO2: 97%  95% 97%  Weight:      Height:         Intake/Output Summary (Last 24 hours) at 02/25/2022 1102 Last data filed at 02/25/2022 1000 Gross per 24 hour  Intake 1037.91 ml  Output 850 ml  Net 187.91 ml   Filed Weights   02/23/22 1700 02/24/22 0009  Weight: 69 kg 69.2 kg    General appearance: Awake alert.  In no distress Resp: Clear to auscultation bilaterally.  Normal effort Cardio: S1-S2 is normal regular.  No S3-S4.  No rubs murmurs or bruit GI: Abdomen is soft.  Nontender nondistended.  Bowel sounds are present normal.  No masses organomegaly Extremities: No edema.  Physical deconditioning noted.   Lab Results:  Data Reviewed: I have personally reviewed following labs and reports of the imaging studies  CBC: Recent Labs  Lab 02/23/22 1710 02/24/22 1145  WBC 10.8* 9.5  NEUTROABS 5.4 4.8  HGB 12.8 13.4  HCT 37.9 40.7  MCV 86.7 88.5  PLT 508* 479*    Basic Metabolic Panel: Recent Labs  Lab 02/23/22 1710 02/24/22 1145 02/25/22 0449  NA 133* 133* 135  K 3.5 3.3* 4.1  CL 97* 97* 104  CO2 '27 25 25  '$ GLUCOSE 117* 130* 111*  BUN '17 12 16  '$ CREATININE 0.68 0.61 0.60  CALCIUM 9.9 9.8 9.2  MG  --  2.2 2.1    GFR: Estimated Creatinine Clearance: 45.7 mL/min (by C-G formula based on SCr of 0.6 mg/dL).  Liver Function Tests: Recent Labs  Lab 02/23/22 1710 02/24/22 1145  AST 70* 53*  ALT 21 22  ALKPHOS 354* 314*  BILITOT 0.7 0.9  PROT 7.5 7.3  ALBUMIN 3.0* 3.0*    Recent Labs  Lab 02/23/22 1714  AMMONIA 27     CBG: Recent Labs  Lab 02/23/22 1729  GLUCAP 112*     Thyroid Function Tests: Recent Labs    02/24/22 1145  TSH 3.798    Anemia Panel: Recent Labs    02/24/22 1145  [place]*    Recent Results (from the past 240 hour(s))  Culture, Urine     Status: Abnormal   Collection Time: 02/20/22  4:22 PM   Specimen: Urine, Random  Result Value Ref Range Status   Specimen Description   Final    URINE, RANDOM Performed at [facility]  [address] [place], [place] [address]    Special Requests   Final    NONE Performed at [facility], [place] [address] [place], [place] [address]    Culture MULTIPLE SPECIES PRESENT, SUGGEST RECOLLECTION (A)  Final   Report Status 02/22/2022 FINAL  Final  SARS Coronavirus 2 by RT PCR (hospital order, performed in [facility] hospital lab) *cepheid single result test* Anterior Nasal Swab     Status: None   Collection Time: 02/24/22 12:00 AM   Specimen: Anterior Nasal Swab  Result Value Ref Range Status   SARS Coronavirus 2 by RT PCR NEGATIVE NEGATIVE Final    Comment: (NOTE) SARS-CoV-2 target nucleic acids are NOT DETECTED.  The SARS-CoV-2 RNA is generally detectable in upper and lower respiratory specimens during the acute phase of infection. The lowest concentration of SARS-CoV-2 viral copies this assay can detect is 250  copies / mL. A negative result does not preclude SARS-CoV-2 infection and should not be used as the sole basis for treatment or other patient management decisions.  A negative result may occur with improper specimen collection / handling, submission of specimen other than nasopharyngeal swab, presence of viral mutation(s) within the areas targeted by this assay, and inadequate number of viral copies (<250 copies / mL). A negative result must be combined with clinical observations, patient history, and epidemiological information.  Fact Sheet for Patients:   https://www.patel.info/  Fact Sheet for Healthcare Providers: https://hall.com/  This test is not yet approved or  cleared by the [place] FDA and has been authorized for detection and/or diagnosis of SARS-CoV-2 by FDA under an Emergency Use Authorization (EUA).  This EUA will remain in effect (meaning this test can be used) for the duration of the COVID-19 declaration under Section 564(b)(1) of the Act, 21 U.S.C. section 360bbb-3(b)(1), unless  the authorization is terminated or revoked sooner.  Performed at [facility], [place] [address], [place], [place] [address]       Radiology Studies: MR CERVICAL SPINE W WO CONTRAST  Result Date: 02/25/2022 CLINICAL DATA:  Evaluation for metastatic disease EXAM: MRI CERVICAL SPINE WITHOUT AND WITH CONTRAST TECHNIQUE: Multiplanar and multiecho pulse sequences of the cervical spine, to include the craniocervical junction and cervicothoracic junction, were obtained without and with intravenous contrast. CONTRAST:  29m GADAVIST GADOBUTROL 1 MMOL/ML IV SOLN COMPARISON:  No prior MRI of the cervical spine available FINDINGS: Alignment: Trace retrolisthesis C3 on C4. Trace anterolisthesis C7 on T1. Vertebrae: Diffusely abnormal marrow signal, seen throughout the cervical and upper thoracic spine, concerning for diffuse osseous metastatic disease. No evidence of pathologic fracture. Cord: Normal in signal and morphology. Possible small T2 hyperintense lesion in the central spinal cord possibly in the canal, which is only seen on the sagittal T2 images (series 10, image 8) at the level of C7. No abnormal spinal cord enhancement. Posterior Fossa, vertebral arteries, paraspinal tissues: Normal craniocervical junction. Patent vertebral artery flow voids. Disc levels: C2-C3: Minimal disc bulge. Facet and uncovertebral hypertrophy. No spinal canal stenosis. Mild right neural foraminal narrowing. C3-C4: Trace retrolisthesis with disc osteophyte complex. Facet and uncovertebral hypertrophy. Mild spinal canal stenosis. Mild bilateral neural foraminal narrowing. C4-C5: Disc height loss with small disc osteophyte complex. Facet arthropathy. No spinal canal stenosis or neural foraminal narrowing. C5-C6: Disc height loss with disc osteophyte complex. Facet and uncovertebral hypertrophy. No spinal canal stenosis. Mild right neural foraminal narrowing. C6-C7: Small disc osteophyte complex. Facet and  uncovertebral hypertrophy. No spinal canal stenosis. Mild left neural foraminal narrowing. C7-T1: Trace anterolisthesis with minimal disc bulge. No spinal canal stenosis. Mild bilateral neural foraminal narrowing. IMPRESSION: 1. Diffusely abnormal marrow signal in the cervical and upper thoracic spine, concerning for diffuse osseous metastatic disease. 2. C3-C4 mild spinal canal stenosis and mild bilateral neural foraminal narrowing. 3. No additional spinal canal stenosis, with mild neural foraminal narrowing noted on the right at C2-C3 and C5-C6, on the left at C6-C7, and bilaterally at C7-T1. Electronically Signed   By: AMerilyn BabaM.D.   On: 02/25/2022 01:39   EEG adult  Result Date: 02/24/2022 YLora Havens MD     02/24/2022 10:14 PM Patient Name: JJanelly SwitalskiApplewhite MRN: [id number] Attending: PLora HavensReferring Physician/Provider: SVernelle Emerald MD Date: 02/24/2022 Duration: 24.25 mins Patient history: 89yoF with syncope. EEG to evaluate for seizure. Level of alertness: Awake, drowsy AEDs during EEG  study: None Technical aspects: This EEG study was done with scalp electrodes positioned according to the 10-20 International system of electrode placement. Electrical activity was reviewed with band pass filter of 1-'70Hz'$ , sensitivity of 7 uV/mm, display speed of 74m/sec with a '60Hz'$  notched filter applied as appropriate. EEG data were recorded continuously and digitally stored.  Video monitoring was available and reviewed as appropriate. Description: The posterior dominant rhythm consists of 8 Hz activity of moderate voltage (25-35 uV) seen predominantly in posterior head regions, symmetric and reactive to eye opening and eye closing. Drowsiness was characterized by attenuation of the posterior background rhythm. EEG showed intermittent generalized 3 to 6 Hz theta-delta slowing. Hyperventilation and photic stimulation were not performed.   ABNORMALITY - Intermittent slow, generalized  IMPRESSION: This study is suggestive of mild diffuse encephalopathy, nonspecific etiology. No seizures or epileptiform discharges were seen throughout the recording. PLora Havens  MR BRAIN W WO CONTRAST  Result Date: 02/24/2022 CLINICAL DATA:  Brain metastases suspected, history of metastatic breast cancer, weakness, confusion EXAM: MRI HEAD WITHOUT AND WITH CONTRAST TECHNIQUE: Multiplanar, multiecho pulse sequences of the brain and surrounding structures were obtained without and with intravenous contrast. CONTRAST:  726mGADAVIST GADOBUTROL 1 MMOL/ML IV SOLN COMPARISON:  Prior MRI head from 02/13/2020 could not be retrieved. Correlation is made with 02/23/2022 CT head FINDINGS: Brain: Along the anterior right frontal convexity, there is a lesion that appears to be dural-based and demonstrates some enhancement, which measures approximately 0.9 x 2.1 x 1.1 cm (AP x TR x CC) (series 16, image 107 and series 19, image 5). This exerts mild mass effect on the underlying frontal lobe without evidence of increased T2 hyperintense signal in the parenchyma. The lesion appears to extend through the calvarium (series 16, image 105-108) to an area of enhancing tissue on the right frontal scalp. The scalp lesion enhances and measures approximately 3.9 x 4.5 x 1.3 cm (series 20, image 9 and series 19, image 11). No restricted diffusion to suggest acute or subacute infarct. No acute hemorrhage or midline shift. No hydrocephalus or extra-axial collection. No abnormal parenchymal enhancement. Confluent T2 hyperintense signal in the periventricular white matter and pons, likely the sequela of severe chronic small vessel ischemic disease. Lacunar infarcts in the bilateral thalami, basal ganglia, and cerebellar hemispheres. Vascular: Normal arterial flow voids. Normal arterial and venous enhancement. Skull and upper cervical spine: Numerous enhancing lesions throughout the calvarium, most likely metastatic disease. Abnormal  marrow signal is also noted in the cervical spine. Sinuses/Orbits: No acute finding. Status post bilateral lens replacements. Other: The mastoids are well aerated. IMPRESSION: 1. Enhancing lesion along the anterior right frontal convexity, likely dural-based, which extends through the right frontal calvarium and appears connected to a right frontal scalp lesion. Mild mass effect on the right frontal lobe without edema. Given the patient's history, a dural-based or osseous metastatic process is favored. 2. Numerous enhancing lesions in the calvarium, most likely metastatic disease given the patient's history. Abnormal marrow signal is also noted in the imaged cervical spine. 3. No evidence of parenchymal metastatic disease. These results were called by telephone at the time of interpretation on 02/24/2022 at 12:34 am to [facility] who verbally acknowledged these results. Electronically Signed   By: AlMerilyn Baba.D.   On: 02/24/2022 00:35   CT L-SPINE NO CHARGE  Result Date: 02/23/2022 CLINICAL DATA:  Pt bib PTAR for ams and previous fall on August 10th. Pt experienced LOC prior to fall. Pt  sent in by family for increased AMS EXAM: CT LUMBAR SPINE WITHOUT CONTRAST TECHNIQUE: Multidetector CT imaging of the lumbar spine was performed without intravenous contrast administration. Multiplanar CT image reconstructions were also generated. RADIATION DOSE REDUCTION: This exam was performed according to the departmental dose-optimization program which includes automated exposure control, adjustment of the mA and/or kV according to patient size and/or use of iterative reconstruction technique. COMPARISON:  PET-CT 10/07/2020 FINDINGS: Segmentation: 5 lumbar type vertebrae. Alignment: Dextrocurvature centered at L3 level. Mild retrolisthesis of L1 on L2, L2 on L3, L3 on L4. Vertebrae: No acute fracture. Diffuse axial and appendicular sclerotic osseous metastases. Paraspinal and other soft tissues: Negative.  Disc levels: Multilevel intervertebral disc space narrowing with intervertebral disc space vacuum phenomenon at the L2-L3 level. Other: Atherosclerotic plaque. Please see separately dictated CT abdomen pelvis 02/23/2022. IMPRESSION: 1. No acute displaced fracture or traumatic listhesis of the lumbar spine. 2. Diffuse axial and appendicular sclerotic osseous metastases. 3. Please see separately dictated CT abdomen pelvis 02/23/2022. 4.  Aortic Atherosclerosis (ICD10-I70.0). Electronically Signed   By: Iven Finn M.D.   On: 02/23/2022 20:33   CT Abdomen Pelvis W Contrast  Result Date: 02/23/2022 CLINICAL DATA:  Pt bib PTAR for ams and previous fall on August 10th. Pt experienced LOC prior to fall. Pt sent in by family for increased AMS. Abdominal pain, acute, nonlocalized mild elevation lfts, hx cancer EXAM: CT ABDOMEN AND PELVIS WITH CONTRAST TECHNIQUE: Multidetector CT imaging of the abdomen and pelvis was performed using the standard protocol following bolus administration of intravenous contrast. RADIATION DOSE REDUCTION: This exam was performed according to the departmental dose-optimization program which includes automated exposure control, adjustment of the mA and/or kV according to patient size and/or use of iterative reconstruction technique. CONTRAST:  178m OMNIPAQUE IOHEXOL 300 MG/ML  SOLN COMPARISON:  CT angiography chest 10/18/2021, PET CT 10/07/2020, MRI abdomen 09/10/2016 FINDINGS: Lower chest: Peripheral reticulations with cystic changes. Hepatobiliary: No focal liver abnormality. Status post cholecystectomy. No biliary dilatation. Pancreas: Vague hypodensity along the pancreatic head likely related to known pancreatic divisum that is not well visualized (2:20). No definite focal lesion. Normal pancreatic contour. No surrounding inflammatory changes. No main pancreatic ductal dilatation. Spleen: Status post splenectomy. Adrenals/Urinary Tract: No adrenal nodule bilaterally. Bilateral kidneys  enhance symmetrically. Multi septated (thin septations) 5.1 x 4.1 cm left renal cyst appears grossly stable in size. Subcentimeter hypodensities are too small to characterize. 6 mm right nephrolithiasis. No left nephrolithiasis. No hydronephrosis. No hydroureter. The urinary bladder is unremarkable. Stomach/Bowel: Stomach is within normal limits. No evidence of bowel wall thickening or dilatation. Colonic diverticulosis. Appendix appears normal. Vascular/Lymphatic: Query filling defect within the left portal vein noted on axial view likely artifactual with no finding on coronal sagittal view. No abdominal aorta or iliac aneurysm. Mild atherosclerotic plaque of the aorta and its branches. No abdominal, pelvic, or inguinal lymphadenopathy. Reproductive: Uterus and bilateral adnexa are unremarkable. Other: No intraperitoneal free fluid. No intraperitoneal free gas. No organized fluid collection. Musculoskeletal: No abdominal wall hernia or abnormality. Diffuse sclerotic lesion of the axial and appendicular skeleton. Lytic lesion along the left iliac bone (2:58). No acute displaced fracture. Please see separately dictated CT lumbar spine 02/23/2022. IMPRESSION: 1. No acute traumatic injury of the abdomen or pelvis. 2. Nonobstructing 6 mm right nephrolithiasis. 3. Colonic diverticulosis with no acute diverticulitis. 4. Grossly stable in size minimally complex 5.1 x 4.1 cm left renal cyst. Lesion better evaluated on MRI abdomen 09/10/2016. 5.  Aortic Atherosclerosis (ICD10-I70.0). 6. Please  see separately dictated CT lumbar spine 02/23/2022. Electronically Signed   By: Iven Finn M.D.   On: 02/23/2022 20:30   CT Head Wo Contrast  Result Date: 02/23/2022 CLINICAL DATA:  Mental status change.  Previous fall. EXAM: CT HEAD WITHOUT CONTRAST TECHNIQUE: Contiguous axial images were obtained from the base of the skull through the vertex without intravenous contrast. RADIATION DOSE REDUCTION: This exam was performed  according to the departmental dose-optimization program which includes automated exposure control, adjustment of the mA and/or kV according to patient size and/or use of iterative reconstruction technique. COMPARISON:  None Available. FINDINGS: Brain: No evidence of acute infarction, hemorrhage, hydrocephalus, extra-axial collection or mass lesion/mass effect. Remote infarct within the left cerebellar hemisphere identified, image 55/5. There is mild diffuse low-attenuation within the subcortical and periventricular white matter compatible with chronic microvascular disease. Prominence of the sulci and ventricles compatible with brain atrophy. Vascular: No hyperdense vessel or unexpected calcification. Skull: Normal. Negative for fracture or focal lesion. Sinuses/Orbits: No acute finding. Other: There is a right frontal scalp hematoma which measures approximately 9 mm in thickness. No underlying skull fracture identified. IMPRESSION: 1. No acute intracranial abnormalities. 2. Chronic small vessel ischemic disease and brain atrophy. 3. Remote infarct within the left cerebellar hemisphere. 4. Right frontal scalp hematoma. Electronically Signed   By: Kerby Moors M.D.   On: 02/23/2022 20:24   DG Chest Port 1 View  Result Date: 02/23/2022 CLINICAL DATA:  Altered mental status and weakness. EXAM: PORTABLE CHEST 1 VIEW COMPARISON:  CT chest and chest x-ray dated October 18, 2021. FINDINGS: Stable cardiomediastinal silhouette. Unchanged mild peripheral and basilar predominant interstitial thickening. 1.7 cm nodular density at the right lung base has been present on prior studies and represents prominent costochondral calcification, more accentuated on today's exam given mild leftward patient rotation. No focal consolidation, pleural effusion, or pneumothorax. No acute osseous abnormality. IMPRESSION: 1. No active disease. 2. Unchanged mild interstitial lung disease. Electronically Signed   By: Titus Dubin M.D.   On:  02/23/2022 17:39       LOS: 1 day   [facility] Pager on [email or web address]  02/25/2022, 11:02 AM

## 2022-02-26 ENCOUNTER — Telehealth: Payer: Self-pay

## 2022-02-26 DIAGNOSIS — I10 Essential (primary) hypertension: Secondary | ICD-10-CM | POA: Diagnosis not present

## 2022-02-26 DIAGNOSIS — R531 Weakness: Secondary | ICD-10-CM | POA: Diagnosis not present

## 2022-02-26 LAB — VITAMIN B1: Vitamin B1 (Thiamine): 92 nmol/L (ref 66.5–200.0)

## 2022-02-26 MED ORDER — ACETAMINOPHEN-CODEINE #3 300-30 MG PO TABS: 1.0000 | ORAL_TABLET | ORAL | 0 refills | Status: DC | PRN

## 2022-02-26 MED ORDER — SENNOSIDES-DOCUSATE SODIUM 8.6-50 MG PO TABS: 2.0000 | ORAL_TABLET | Freq: Two times a day (BID) | ORAL | 2 refills | Status: AC

## 2022-02-26 MED ORDER — POLYETHYLENE GLYCOL 3350 17 G PO PACK: 17.0000 g | PACK | Freq: Two times a day (BID) | ORAL | 2 refills | Status: AC

## 2022-02-26 MED ORDER — FLEET ENEMA 7-19 GM/118ML RE ENEM
1.0000 | ENEMA | Freq: Once | RECTAL | Status: AC
Start: 2022-02-26 — End: 2022-02-26
  Administered 2022-02-26: 1 via RECTAL
  Filled 2022-02-26: qty 1

## 2022-02-26 NOTE — TOC Transition Note (Signed)
Transition of Care [facility]) - CM/SW Discharge Note   Patient Details  Name: Amy Kline MRN: [id number] Date of Birth: [birth year]  [facility] CM/SW Contact:  Leeroy Cha, RN Phone Number: 02/26/2022, 12:45 PM   Clinical Narrative:    Tct-ptar 2 pm pick up set for patient to go home.     Barriers to Discharge: Continued Medical Work up   Patient Goals and CMS Choice Patient states their goals for this hospitalization and ongoing recovery are:: to go home   Choice offered to / list presented to : Spouse, Patient  Discharge Placement                       Discharge Plan and Services   Discharge Planning Services: CM Consult                                 Social Determinants of Health (SDOH) Interventions     Readmission Risk Interventions     No data to display

## 2022-02-26 NOTE — Telephone Encounter (Signed)
Pt is being discharged home today w/Hospice services in the home per [name], pt's daughter. Message sent to Dr [name], Gabriel Rung, Melissa P<NP, and Evelena Peat, pt service navitgator.

## 2022-02-26 NOTE — Progress Notes (Signed)
   This pt was referred to hospice services at home. I have met with the pt's family 3 daughters and her spouse. They were explained hospice philosophy and are in agreement with pt going home with hospice care. I have ordered a hospital bed, Over bed table, W/C, BSC and oxygen for pt to have at home.This should be delivered today per family request.   Dr. [name] will be attending for this pt and her hospice services. Our MD has approved the pt for hospice services as well at home.   Webb Silversmith RN [phone]

## 2022-02-26 NOTE — Progress Notes (Signed)
Palliative:  HPI: 88 year old female with past medical history significant for left breast cancer diagnosed in 2013 with recurrence in 2022.  She is currently followed by Dr. [name], oncologist in [place].  She presented to the hospital 8/14 due to worsening weakness and intermittent altered mental status.Palliative care has been asked to get involved in the setting of metastatic breast cancer which appears to be worsening for further conversations about hospice care and CODE STATUS.   I met today at Amy Kline's bedside with she and daughter, Amy Kline. They are making plans with hospice to get home today. We reviewed care at home. We discussed consideration of foley catheter placement to help with care at home. Amy Kline reluctantly agrees with catheter placement. We discussed medications and comfort at home. They shared stories and are in good spirits. Amy Kline is overwhelmed with all the decisions that have been made over the past days. I reassure her that this will calm down once she is tucked in at home. Hospice equipment should be delivered this morning.   All questions/concerns addressed. Emotional support provided.   Exam: Alert, oriented. Generalized fatigue and weakness. Pale. Good spirits. Breathing regular, unlabored. Abd soft - diarrhea today.   Plan: - Home with hospice today  35 min  Vinie Sill, NP Palliative Medicine Team Pager [phone] (Please see [email or web address] for schedule) Team Phone [phone]    Greater than 50%  of this time was spent counseling and coordinating care related to the above assessment and plan

## 2022-02-26 NOTE — TOC Progression Note (Addendum)
Transition of Care [facility]) - Progression Note    Patient Details  Name: Amy Kline MRN: [id number] Date of Birth: 1930-07-01  [facility] CM/SW Contact  Leeroy Cha, RN Phone Number: 02/26/2022, 9:18 AM  Clinical Narrative:    Arsenio Katz with [facility] texted to see where progress with possible dc is.  Reply-awaiting the dme to be delivered.  Will stay in touch for dc. Discharge packet completed except for tct-ptar for transport.  Packet to unit clerk at 1044. Tct-daughter-sharon-message lto please call back to verify where patient is going and to see if dme has arrived yet.  Expected Discharge Plan: Home w Hospice Care Barriers to Discharge: Continued Medical Work up  Expected Discharge Plan and Services Expected Discharge Plan: [place]   Discharge Planning Services: CM Consult   Living arrangements for the past 2 months: Single Family Home                                       Social Determinants of Health (SDOH) Interventions    Readmission Risk Interventions     No data to display

## 2022-02-26 NOTE — Progress Notes (Addendum)
Patient and daughter, Amy Kline, have been read and explained discharge instructions. IV of the right arm has been removed and site is clean, dry and intact. Foley catheter has been placed per order for discharge to home with hospice. Patient and patient's daughter, Amy Kline, have been educated on foley catheter care. There are no further questions regarding foley care at this time.  Layla Maw, RN

## 2022-02-26 NOTE — Discharge Summary (Signed)
[facility]  Physician Discharge Summary   Patient ID: Amy Kline MRN: [id number] DOB/AGE: February 24, 1930 88 y.o.  Admit date: 02/23/2022 Discharge date:   02/26/2022   PCP: Raina Mina., MD  DISCHARGE DIAGNOSES:  Metastatic breast cancer ([place])   Chronic diastolic CHF (congestive heart failure) ([place])   Essential hypertension   Fibromyalgia   GERD without esophagitis   Orthostatic syncope   RECOMMENDATIONS FOR OUTPATIENT FOLLOW UP: Patient going home with hospice services   Home Health: None Equipment/Devices: DME to be arranged by hospice  CODE STATUS: DNR  DISCHARGE CONDITION: fair  Diet recommendation: Regular as tolerated  INITIAL HISTORY:  88 year old female with past history of left breast cancer diagnosed 2013 status post left mastectomy at that time with recurrence in late 2021 with identified evidence of extension into the left axillary regional lymph nodes as well as bony metastases.  Patient also has a history of diastolic congestive heart failure (Echo 01/2022 EF 60-65% with G1DD), hypertension, orthostatic hypotension (on Pyridostigmine), polyneuropathy, gastroesophageal reflux disease and fibromyalgia.  Patient has been brought into [facility] emergency department due to progressively worsening weakness with bouts of brief confusion.  It appears that patient has been gradually experiencing worsening generalized weakness since early 2022.  She was also found to have recurrent breast cancer with metastases.  Followed by medical oncology in [place].   Consultants: Palliative care   Procedures: None   HOSPITAL COURSE:   Metastatic breast cancer MRI of the brain showed a dural based mass with some mass effect but without edema.  There are also mets in the calvarium and within the cervical spine. Patient followed by Dr. [name] at [place].  She spoke to the patient and the family over the phone.  She supports transition to hospice.   Palliative care was consulted.  Family meeting occurred yesterday and decision was made to transition to hospice at home.    Generalized weakness No evidence of infection based on evaluation in the emergency department.  Her weakness is likely multifactorial but is mostly secondary to progressive metastatic malignancy.  Orthostatic hypotension Known history of same.  Managed by neurology in the outpatient setting with pyridostigmine.  EEG unremarkable for seizure activity.   Chronic diastolic CHF Holding furosemide.   Essential hypertension Noted to be on amlodipine, carvedilol, Avapro.    Fibromyalgia Noted to be on Savella.   GERD Continue PPI   Constipation Continue bowel regimen.  TSH normal.  Give enema prior to discharge.     Hyponatremia/hypokalemia     Patient is stable.  Okay for discharge home once DME has been delievered.   PERTINENT LABS:  The results of significant diagnostics from this hospitalization (including imaging, microbiology, ancillary and laboratory) are listed below for reference.    Microbiology: Recent Results (from the past 240 hour(s))  Culture, Urine     Status: Abnormal   Collection Time: 02/20/22  4:22 PM   Specimen: Urine, Random  Result Value Ref Range Status   Specimen Description   Final    URINE, RANDOM Performed at [facility] [address], [place], [place] [address]    Special Requests   Final    NONE Performed at [facility], [place] [address], [place], [place] [address]    Culture MULTIPLE SPECIES PRESENT, SUGGEST RECOLLECTION (A)  Final   Report Status 02/22/2022 FINAL  Final  SARS Coronavirus 2 by RT PCR (hospital order, performed in [facility] hospital lab) *cepheid single result  test* Anterior Nasal Swab     Status: None   Collection Time: 02/24/22 12:00 AM   Specimen: Anterior Nasal Swab  Result Value Ref Range Status   SARS Coronavirus 2 by RT PCR NEGATIVE NEGATIVE Final     Comment: (NOTE) SARS-CoV-2 target nucleic acids are NOT DETECTED.  The SARS-CoV-2 RNA is generally detectable in upper and lower respiratory specimens during the acute phase of infection. The lowest concentration of SARS-CoV-2 viral copies this assay can detect is 250 copies / mL. A negative result does not preclude SARS-CoV-2 infection and should not be used as the sole basis for treatment or other patient management decisions.  A negative result may occur with improper specimen collection / handling, submission of specimen other than nasopharyngeal swab, presence of viral mutation(s) within the areas targeted by this assay, and inadequate number of viral copies (<250 copies / mL). A negative result must be combined with clinical observations, patient history, and epidemiological information.  Fact Sheet for Patients:   https://www.patel.info/  Fact Sheet for Healthcare Providers: https://hall.com/  This test is not yet approved or  cleared by the [place] FDA and has been authorized for detection and/or diagnosis of SARS-CoV-2 by FDA under an Emergency Use Authorization (EUA).  This EUA will remain in effect (meaning this test can be used) for the duration of the COVID-19 declaration under Section 564(b)(1) of the Act, 21 U.S.C. section 360bbb-3(b)(1), unless the authorization is terminated or revoked sooner.  Performed at [facility], [place] [address] [place], [place] [address]      Labs:   Basic Metabolic Panel: Recent Labs  Lab 02/23/22 1710 02/24/22 1145 02/25/22 0449  NA 133* 133* 135  K 3.5 3.3* 4.1  CL 97* 97* 104  CO2 '27 25 25  '$ GLUCOSE 117* 130* 111*  BUN '17 12 16  '$ CREATININE 0.68 0.61 0.60  CALCIUM 9.9 9.8 9.2  MG  --  2.2 2.1   Liver Function Tests: Recent Labs  Lab 02/23/22 1710 02/24/22 1145  AST 70* 53*  ALT 21 22  ALKPHOS 354* 314*  BILITOT 0.7 0.9  PROT 7.5 7.3  ALBUMIN  3.0* 3.0*    Recent Labs  Lab 02/23/22 1714  AMMONIA 27   CBC: Recent Labs  Lab 02/23/22 1710 02/24/22 1145  WBC 10.8* 9.5  NEUTROABS 5.4 4.8  HGB 12.8 13.4  HCT 37.9 40.7  MCV 86.7 88.5  PLT 508* 479*     CBG: Recent Labs  Lab 02/23/22 1729  GLUCAP 112*     IMAGING STUDIES MR CERVICAL SPINE W WO CONTRAST  Result Date: 02/25/2022 CLINICAL DATA:  Evaluation for metastatic disease EXAM: MRI CERVICAL SPINE WITHOUT AND WITH CONTRAST TECHNIQUE: Multiplanar and multiecho pulse sequences of the cervical spine, to include the craniocervical junction and cervicothoracic junction, were obtained without and with intravenous contrast. CONTRAST:  40m GADAVIST GADOBUTROL 1 MMOL/ML IV SOLN COMPARISON:  No prior MRI of the cervical spine available FINDINGS: Alignment: Trace retrolisthesis C3 on C4. Trace anterolisthesis C7 on T1. Vertebrae: Diffusely abnormal marrow signal, seen throughout the cervical and upper thoracic spine, concerning for diffuse osseous metastatic disease. No evidence of pathologic fracture. Cord: Normal in signal and morphology. Possible small T2 hyperintense lesion in the central spinal cord possibly in the canal, which is only seen on the sagittal T2 images (series 10, image 8) at the level of C7. No abnormal spinal cord enhancement. Posterior Fossa, vertebral arteries, paraspinal tissues: Normal craniocervical junction. Patent vertebral artery flow  voids. Disc levels: C2-C3: Minimal disc bulge. Facet and uncovertebral hypertrophy. No spinal canal stenosis. Mild right neural foraminal narrowing. C3-C4: Trace retrolisthesis with disc osteophyte complex. Facet and uncovertebral hypertrophy. Mild spinal canal stenosis. Mild bilateral neural foraminal narrowing. C4-C5: Disc height loss with small disc osteophyte complex. Facet arthropathy. No spinal canal stenosis or neural foraminal narrowing. C5-C6: Disc height loss with disc osteophyte complex. Facet and uncovertebral  hypertrophy. No spinal canal stenosis. Mild right neural foraminal narrowing. C6-C7: Small disc osteophyte complex. Facet and uncovertebral hypertrophy. No spinal canal stenosis. Mild left neural foraminal narrowing. C7-T1: Trace anterolisthesis with minimal disc bulge. No spinal canal stenosis. Mild bilateral neural foraminal narrowing. IMPRESSION: 1. Diffusely abnormal marrow signal in the cervical and upper thoracic spine, concerning for diffuse osseous metastatic disease. 2. C3-C4 mild spinal canal stenosis and mild bilateral neural foraminal narrowing. 3. No additional spinal canal stenosis, with mild neural foraminal narrowing noted on the right at C2-C3 and C5-C6, on the left at C6-C7, and bilaterally at C7-T1. Electronically Signed   By: Merilyn Baba M.D.   On: 02/25/2022 01:39   EEG adult  Result Date: 02/24/2022 Lora Havens, MD     02/24/2022 10:14 PM Patient Name: Amy Kline MRN: [id number] Epilepsy Attending: Lora Havens Referring Physician/Provider: Vernelle Emerald, MD Date: 02/24/2022 Duration: 24.25 mins Patient history: 88yo F with syncope. EEG to evaluate for seizure. Level of alertness: Awake, drowsy AEDs during EEG study: None Technical aspects: This EEG study was done with scalp electrodes positioned according to the 10-20 International system of electrode placement. Electrical activity was reviewed with band pass filter of 1-'70Hz'$ , sensitivity of 7 uV/mm, display speed of 31m/sec with a '60Hz'$  notched filter applied as appropriate. EEG data were recorded continuously and digitally stored.  Video monitoring was available and reviewed as appropriate. Description: The posterior dominant rhythm consists of 8 Hz activity of moderate voltage (25-35 uV) seen predominantly in posterior head regions, symmetric and reactive to eye opening and eye closing. Drowsiness was characterized by attenuation of the posterior background rhythm. EEG showed intermittent generalized 3 to 6 Hz  theta-delta slowing. Hyperventilation and photic stimulation were not performed.   ABNORMALITY - Intermittent slow, generalized IMPRESSION: This study is suggestive of mild diffuse encephalopathy, nonspecific etiology. No seizures or epileptiform discharges were seen throughout the recording. PLora Havens  MR BRAIN W WO CONTRAST  Result Date: 02/24/2022 CLINICAL DATA:  Brain metastases suspected, history of metastatic breast cancer, weakness, confusion EXAM: MRI HEAD WITHOUT AND WITH CONTRAST TECHNIQUE: Multiplanar, multiecho pulse sequences of the brain and surrounding structures were obtained without and with intravenous contrast. CONTRAST:  735mGADAVIST GADOBUTROL 1 MMOL/ML IV SOLN COMPARISON:  Prior MRI head from 02/13/2020 could not be retrieved. Correlation is made with 02/23/2022 CT head FINDINGS: Brain: Along the anterior right frontal convexity, there is a lesion that appears to be dural-based and demonstrates some enhancement, which measures approximately 0.9 x 2.1 x 1.1 cm (AP x TR x CC) (series 16, image 107 and series 19, image 5). This exerts mild mass effect on the underlying frontal lobe without evidence of increased T2 hyperintense signal in the parenchyma. The lesion appears to extend through the calvarium (series 16, image 105-108) to an area of enhancing tissue on the right frontal scalp. The scalp lesion enhances and measures approximately 3.9 x 4.5 x 1.3 cm (series 20, image 9 and series 19, image 11). No restricted diffusion to suggest acute or subacute infarct. No acute hemorrhage  or midline shift. No hydrocephalus or extra-axial collection. No abnormal parenchymal enhancement. Confluent T2 hyperintense signal in the periventricular white matter and pons, likely the sequela of severe chronic small vessel ischemic disease. Lacunar infarcts in the bilateral thalami, basal ganglia, and cerebellar hemispheres. Vascular: Normal arterial flow voids. Normal arterial and venous enhancement.  Skull and upper cervical spine: Numerous enhancing lesions throughout the calvarium, most likely metastatic disease. Abnormal marrow signal is also noted in the cervical spine. Sinuses/Orbits: No acute finding. Status post bilateral lens replacements. Other: The mastoids are well aerated. IMPRESSION: 1. Enhancing lesion along the anterior right frontal convexity, likely dural-based, which extends through the right frontal calvarium and appears connected to a right frontal scalp lesion. Mild mass effect on the right frontal lobe without edema. Given the patient's history, a dural-based or osseous metastatic process is favored. 2. Numerous enhancing lesions in the calvarium, most likely metastatic disease given the patient's history. Abnormal marrow signal is also noted in the imaged cervical spine. 3. No evidence of parenchymal metastatic disease. These results were called by telephone at the time of interpretation on 02/24/2022 at 12:34 am to provider [facility] , who verbally acknowledged these results. Electronically Signed   By: Merilyn Baba M.D.   On: 02/24/2022 00:35   CT L-SPINE NO CHARGE  Result Date: 02/23/2022 CLINICAL DATA:  Pt bib PTAR for ams and previous fall on August 10th. Pt experienced LOC prior to fall. Pt sent in by family for increased AMS EXAM: CT LUMBAR SPINE WITHOUT CONTRAST TECHNIQUE: Multidetector CT imaging of the lumbar spine was performed without intravenous contrast administration. Multiplanar CT image reconstructions were also generated. RADIATION DOSE REDUCTION: This exam was performed according to the departmental dose-optimization program which includes automated exposure control, adjustment of the mA and/or kV according to patient size and/or use of iterative reconstruction technique. COMPARISON:  PET-CT 10/07/2020 FINDINGS: Segmentation: 5 lumbar type vertebrae. Alignment: Dextrocurvature centered at L3 level. Mild retrolisthesis of L1 on L2, L2 on L3, L3 on L4. Vertebrae:  No acute fracture. Diffuse axial and appendicular sclerotic osseous metastases. Paraspinal and other soft tissues: Negative. Disc levels: Multilevel intervertebral disc space narrowing with intervertebral disc space vacuum phenomenon at the L2-L3 level. Other: Atherosclerotic plaque. Please see separately dictated CT abdomen pelvis 02/23/2022. IMPRESSION: 1. No acute displaced fracture or traumatic listhesis of the lumbar spine. 2. Diffuse axial and appendicular sclerotic osseous metastases. 3. Please see separately dictated CT abdomen pelvis 02/23/2022. 4.  Aortic Atherosclerosis (ICD10-I70.0). Electronically Signed   By: Iven Finn M.D.   On: 02/23/2022 20:33   CT Abdomen Pelvis W Contrast  Result Date: 02/23/2022 CLINICAL DATA:  Pt bib PTAR for ams and previous fall on August 10th. Pt experienced LOC prior to fall. Pt sent in by family for increased AMS. Abdominal pain, acute, nonlocalized mild elevation lfts, hx cancer EXAM: CT ABDOMEN AND PELVIS WITH CONTRAST TECHNIQUE: Multidetector CT imaging of the abdomen and pelvis was performed using the standard protocol following bolus administration of intravenous contrast. RADIATION DOSE REDUCTION: This exam was performed according to the departmental dose-optimization program which includes automated exposure control, adjustment of the mA and/or kV according to patient size and/or use of iterative reconstruction technique. CONTRAST:  178m OMNIPAQUE IOHEXOL 300 MG/ML  SOLN COMPARISON:  CT angiography chest 10/18/2021, PET CT 10/07/2020, MRI abdomen 09/10/2016 FINDINGS: Lower chest: Peripheral reticulations with cystic changes. Hepatobiliary: No focal liver abnormality. Status post cholecystectomy. No biliary dilatation. Pancreas: Vague hypodensity along the pancreatic head likely related to  known pancreatic divisum that is not well visualized (2:20). No definite focal lesion. Normal pancreatic contour. No surrounding inflammatory changes. No main pancreatic  ductal dilatation. Spleen: Status post splenectomy. Adrenals/Urinary Tract: No adrenal nodule bilaterally. Bilateral kidneys enhance symmetrically. Multi septated (thin septations) 5.1 x 4.1 cm left renal cyst appears grossly stable in size. Subcentimeter hypodensities are too small to characterize. 6 mm right nephrolithiasis. No left nephrolithiasis. No hydronephrosis. No hydroureter. The urinary bladder is unremarkable. Stomach/Bowel: Stomach is within normal limits. No evidence of bowel wall thickening or dilatation. Colonic diverticulosis. Appendix appears normal. Vascular/Lymphatic: Query filling defect within the left portal vein noted on axial view likely artifactual with no finding on coronal sagittal view. No abdominal aorta or iliac aneurysm. Mild atherosclerotic plaque of the aorta and its branches. No abdominal, pelvic, or inguinal lymphadenopathy. Reproductive: Uterus and bilateral adnexa are unremarkable. Other: No intraperitoneal free fluid. No intraperitoneal free gas. No organized fluid collection. Musculoskeletal: No abdominal wall hernia or abnormality. Diffuse sclerotic lesion of the axial and appendicular skeleton. Lytic lesion along the left iliac bone (2:58). No acute displaced fracture. Please see separately dictated CT lumbar spine 02/23/2022. IMPRESSION: 1. No acute traumatic injury of the abdomen or pelvis. 2. Nonobstructing 6 mm right nephrolithiasis. 3. Colonic diverticulosis with no acute diverticulitis. 4. Grossly stable in size minimally complex 5.1 x 4.1 cm left renal cyst. Lesion better evaluated on MRI abdomen 09/10/2016. 5.  Aortic Atherosclerosis (ICD10-I70.0). 6. Please see separately dictated CT lumbar spine 02/23/2022. Electronically Signed   By: Iven Finn M.D.   On: 02/23/2022 20:30   CT Head Wo Contrast  Result Date: 02/23/2022 CLINICAL DATA:  Mental status change.  Previous fall. EXAM: CT HEAD WITHOUT CONTRAST TECHNIQUE: Contiguous axial images were obtained from  the base of the skull through the vertex without intravenous contrast. RADIATION DOSE REDUCTION: This exam was performed according to the departmental dose-optimization program which includes automated exposure control, adjustment of the mA and/or kV according to patient size and/or use of iterative reconstruction technique. COMPARISON:  None Available. FINDINGS: Brain: No evidence of acute infarction, hemorrhage, hydrocephalus, extra-axial collection or mass lesion/mass effect. Remote infarct within the left cerebellar hemisphere identified, image 55/5. There is mild diffuse low-attenuation within the subcortical and periventricular white matter compatible with chronic microvascular disease. Prominence of the sulci and ventricles compatible with brain atrophy. Vascular: No hyperdense vessel or unexpected calcification. Skull: Normal. Negative for fracture or focal lesion. Sinuses/Orbits: No acute finding. Other: There is a right frontal scalp hematoma which measures approximately 9 mm in thickness. No underlying skull fracture identified. IMPRESSION: 1. No acute intracranial abnormalities. 2. Chronic small vessel ischemic disease and brain atrophy. 3. Remote infarct within the left cerebellar hemisphere. 4. Right frontal scalp hematoma. Electronically Signed   By: Kerby Moors M.D.   On: 02/23/2022 20:24   DG Chest Port 1 View  Result Date: 02/23/2022 CLINICAL DATA:  Altered mental status and weakness. EXAM: PORTABLE CHEST 1 VIEW COMPARISON:  CT chest and chest x-ray dated October 18, 2021. FINDINGS: Stable cardiomediastinal silhouette. Unchanged mild peripheral and basilar predominant interstitial thickening. 1.7 cm nodular density at the right lung base has been present on prior studies and represents prominent costochondral calcification, more accentuated on today's exam given mild leftward patient rotation. No focal consolidation, pleural effusion, or pneumothorax. No acute osseous abnormality. IMPRESSION:  1. No active disease. 2. Unchanged mild interstitial lung disease. Electronically Signed   By: Titus Dubin M.D.   On: 02/23/2022 17:39  ECHOCARDIOGRAM COMPLETE  Result Date: 02/05/2022    ECHOCARDIOGRAM REPORT   Patient Name:   Amy Kline Date of Exam: 02/05/2022 Medical Rec #:  [id number]          Height:       66.0 in Accession #:    [id number]         Weight:       154.4 lb Date of Birth:  05-21-1930           BSA:          1.792 m Patient Age:    24 years           BP:           132/66 mmHg Patient Gender: F                  HR:           77 bpm. Exam Location:  [facility] Procedure: 2D Echo, Cardiac Doppler, Color Doppler and Strain Analysis Indications:    Malignant neoplasm of upper-inner quadrant of left breast in                 female, estrogen receptor positive ([place]) [C50.212, Z17.0                 (ICD-10-CM)]; Encounter for other specified special examinations                 [Z01.89 (ICD-10-CM)]  History:        Patient has prior history of Echocardiogram examinations, most                 recent 10/30/2021. Cardiomyopathy, CAD; Risk                 Factors:Hypertension.  Sonographer:    Philipp Deputy RDCS Referring Phys: [place] Comments: Suboptimal subcostal window. IMPRESSIONS  1. GLS -17.8. Left ventricular ejection fraction, by estimation, is 60 to 65%. The left ventricle has normal function. The left ventricle has no regional wall motion abnormalities. Left ventricular diastolic parameters are consistent with Grade I diastolic dysfunction (impaired relaxation).  2. Right ventricular systolic function is normal. The right ventricular size is normal. There is normal pulmonary artery systolic pressure.  3. The mitral valve is normal in structure. No evidence of mitral valve regurgitation. No evidence of mitral stenosis.  4. The aortic valve is normal in structure. Aortic valve regurgitation is mild. No aortic stenosis is present.  5. The inferior vena cava  is normal in size with greater than 50% respiratory variability, suggesting right atrial pressure of 3 mmHg. FINDINGS  Left Ventricle: GLS -17.8. Left ventricular ejection fraction, by estimation, is 60 to 65%. The left ventricle has normal function. The left ventricle has no regional wall motion abnormalities. The left ventricular internal cavity size was normal in size. There is no left ventricular hypertrophy. Left ventricular diastolic parameters are consistent with Grade I diastolic dysfunction (impaired relaxation). Right Ventricle: The right ventricular size is normal. No increase in right ventricular wall thickness. Right ventricular systolic function is normal. There is normal pulmonary artery systolic pressure. The tricuspid regurgitant velocity is 2.43 m/s, and  with an assumed right atrial pressure of 3 mmHg, the estimated right ventricular systolic pressure is 73.5 mmHg. Left Atrium: Left atrial size was normal in size. Right Atrium: Right atrial size was normal in size. Pericardium: There is no evidence of pericardial effusion. Mitral Valve: The mitral valve is  normal in structure. No evidence of mitral valve regurgitation. No evidence of mitral valve stenosis. Tricuspid Valve: The tricuspid valve is normal in structure. Tricuspid valve regurgitation is mild . No evidence of tricuspid stenosis. Aortic Valve: The aortic valve is normal in structure. Aortic valve regurgitation is mild. Aortic regurgitation PHT measures 420 msec. No aortic stenosis is present. Pulmonic Valve: The pulmonic valve was normal in structure. Pulmonic valve regurgitation is not visualized. No evidence of pulmonic stenosis. Aorta: The aortic root is normal in size and structure. Venous: The inferior vena cava is normal in size with greater than 50% respiratory variability, suggesting right atrial pressure of 3 mmHg. IAS/Shunts: No atrial level shunt detected by color flow Doppler.  LEFT VENTRICLE PLAX 2D LVIDd:         3.50 cm    Diastology LVIDs:         2.90 cm   LV e' medial:    5.44 cm/s LV PW:         1.00 cm   LV E/e' medial:  11.8 LV IVS:        1.00 cm   LV e' lateral:   8.87 cm/s LVOT diam:     1.80 cm   LV E/e' lateral: 7.2 LV SV:         64 LV SV Index:   36 LVOT Area:     2.54 cm  RIGHT VENTRICLE RV Basal diam:  2.80 cm RV S prime:     13.70 cm/s TAPSE (M-mode): 2.1 cm LEFT ATRIUM             Index        RIGHT ATRIUM           Index LA diam:        2.00 cm 1.12 cm/m   RA Area:     12.80 cm LA Vol (A2C):   40.1 ml 22.38 ml/m  RA Volume:   26.20 ml  14.62 ml/m LA Vol (A4C):   32.9 ml 18.36 ml/m LA Biplane Vol: 36.6 ml 20.43 ml/m  AORTIC VALVE LVOT Vmax:   138.00 cm/s LVOT Vmean:  92.800 cm/s LVOT VTI:    0.251 m AI PHT:      420 msec  AORTA Ao Root diam: 2.80 cm Ao Asc diam:  3.30 cm MITRAL VALVE               TRICUSPID VALVE MV Area (PHT): 2.56 cm    TR Peak grad:   23.6 mmHg MV Decel Time: 296 msec    TR Vmax:        243.00 cm/s MR Peak grad: 98.0 mmHg MR Vmax:      495.00 cm/s  SHUNTS MV E velocity: 64.30 cm/s  Systemic VTI:  0.25 m MV A velocity: 86.90 cm/s  Systemic Diam: 1.80 cm MV E/A ratio:  0.74 Jenne Campus MD Electronically signed by Jenne Campus MD Signature Date/Time: 02/05/2022/6:03:35 PM    Final     DISCHARGE EXAMINATION: Vitals:   02/25/22 1302 02/25/22 2149 02/26/22 0546 02/26/22 1009  BP: 130/69 (!) 123/58 (!) 117/58 122/66  Pulse: 70 70 71   Resp: '16 18 17   '$ Temp: 98 F (36.7 C) 97.8 F (36.6 C) 97.8 F (36.6 C)   TempSrc: Oral Oral    SpO2: 95% 97% 96%   Weight:      Height:       General appearance: Awake alert.  In no distress Resp: Clear to auscultation  bilaterally.  Normal effort Cardio: S1-S2 is normal regular.  No S3-S4.  No rubs murmurs or bruit GI: Abdomen is soft.  Nontender nondistended.  Bowel sounds are present normal.  No masses organomegaly   DISPOSITION: Home with hospice  Discharge Instructions     Call MD for:  difficulty breathing, headache or visual  disturbances   Complete by: As directed    Call MD for:  extreme fatigue   Complete by: As directed    Call MD for:  persistant dizziness or light-headedness   Complete by: As directed    Call MD for:  persistant nausea and vomiting   Complete by: As directed    Call MD for:  severe uncontrolled pain   Complete by: As directed    Call MD for:  temperature >100.4   Complete by: As directed    Diet - low sodium heart healthy   Complete by: As directed    Discharge instructions   Complete by: As directed    Please take your medications as prescribed.  You were cared for by a hospitalist during your hospital stay. If you have any questions about your discharge medications or the care you received while you were in the hospital after you are discharged, you can call the unit and asked to speak with the hospitalist on call if the hospitalist that took care of you is not available. Once you are discharged, your primary care physician will handle any further medical issues. Please note that NO REFILLS for any discharge medications will be authorized once you are discharged, as it is imperative that you return to your primary care physician (or establish a relationship with a primary care physician if you do not have one) for your aftercare needs so that they can reassess your need for medications and monitor your lab values. If you do not have a primary care physician, you can call [phone] for a physician referral.   Increase activity slowly   Complete by: As directed           Allergies as of 02/26/2022       Reactions   Neosporin [neomycin-polymyxin-gramicidin] Rash   Sulfa Antibiotics Hives   itching   Bacitra-neomycin-polymyxin-hc    Bacitracin-polymyxin B    Bexsero [meningococcal Grp B (recomb) Vaccine] Other (See Comments)   Pt reported severe arthralgias and fatigue after 1st dose of bexsero and wishes to not receive again.   Cefdinir Nausea And Vomiting   Denosumab    Pain in  jaw/head/mouth numb/weak   Neomycin-bacitracin Zn-polymyx    Other Other (See Comments)   Polysporin causes a rash and swelling Unknown   Statins    Macrodantin [nitrofurantoin] Itching, Rash   Pt's daughter,[name], called to state that the last time Dr Bea Graff put her on this med, she had a lot of itching and redness in areas.        Medication List     STOP taking these medications    alendronate 70 MG tablet Commonly known as: FOSAMAX   fluconazole 150 MG tablet Commonly known as: DIFLUCAN   lamoTRIgine 25 MG tablet Commonly known as: LAMICTAL       TAKE these medications    acetaminophen-codeine 300-30 MG tablet Commonly known as: TYLENOL #3 Take 1 tablet by mouth every 4 (four) hours as needed for moderate pain. What changed: when to take this   amLODipine 2.5 MG tablet Commonly known as: NORVASC TAKE 1 TABLET BY MOUTH DAILY   aspirin  81 MG tablet Take 81 mg by mouth daily.   carvedilol 12.5 MG tablet Commonly known as: COREG Take 1 tablet (12.5 mg total) by mouth 2 (two) times daily with a meal.   EPINEPHrine 0.3 mg/0.3 mL Soaj injection Commonly known as: EpiPen 2-Pak Inject 0.3 mg into the muscle as needed for anaphylaxis.   ergocalciferol 1.25 MG (50000 UT) capsule Commonly known as: VITAMIN D2 Take 1 capsule (50,000 Units total) by mouth once a week.   furosemide 20 MG tablet Commonly known as: LASIX Take 1 tablet (20 mg total) by mouth daily. What changed: when to take this   ibuprofen 600 MG tablet Commonly known as: ADVIL Take 1 tablet (600 mg total) by mouth as needed for headache.   letrozole 2.5 MG tablet Commonly known as: FEMARA Take 1 tablet (2.5 mg total) by mouth daily.   loratadine 10 MG tablet Commonly known as: CLARITIN Take 10 mg by mouth daily.   omeprazole 20 MG capsule Commonly known as: PRILOSEC Take 20 mg by mouth at bedtime.   polyethylene glycol 17 g packet Commonly known as: MIRALAX / GLYCOLAX Take 17 g by  mouth 2 (two) times daily.   PROBIOTIC PO Take 1 tablet by mouth daily.   pyridostigmine 60 MG tablet Commonly known as: MESTINON Take 30 mg by mouth 2 (two) times daily.   Savella 50 MG Tabs tablet Generic drug: Milnacipran Take 50 mg by mouth 2 (two) times daily.   senna-docusate 8.6-50 MG tablet Commonly known as: Senokot-S Take 2 tablets by mouth 2 (two) times daily.   telmisartan-hydrochlorothiazide 40-12.5 MG tablet Commonly known as: MICARDIS HCT Take 0.5 tablets by mouth daily.   traMADol 50 MG tablet Commonly known as: ULTRAM Take 1 tablet (50 mg total) by mouth every 6 (six) hours as needed.          Follow-up Information     [name], MD Follow up.   Specialty: Oncology Contact information: [address] [place] [address] [phone]                 TOTAL DISCHARGE TIME: 63 minutes  [facility] Pager on [email or web address]  02/26/2022, 12:14 PM

## 2022-02-27 ENCOUNTER — Encounter: Payer: Self-pay | Admitting: Oncology

## 2022-02-27 ENCOUNTER — Ambulatory Visit: Payer: Medicare Other | Admitting: Oncology

## 2022-02-27 ENCOUNTER — Ambulatory Visit: Payer: Medicare Other

## 2022-02-27 ENCOUNTER — Telehealth: Payer: Self-pay

## 2022-02-27 NOTE — Telephone Encounter (Addendum)
I confirmed they are to stop the Telmisartan per Dr Remi Deter instructions below. ----- Message from [name], MD sent at 02/27/2022  1:33 PM EDT ----- Regarding: RE: Update, med ? Did admission over phone to hospice and reviewed all meds.  Stopping letrozole, vitamin D, pyridostigmine, probiotic, telimisartan/HCTZ and tramadol. Adding Ativan 0.'5mg'$  prn She is now at home, spoke with [facility], husband and [name] ----- Message ----- From: [name], RN Sent: 02/26/2022   3:54 PM EDT To: [name], MD; Melodye Ped, NP; # Subject: RE: Update, med ?                              Pt is being discharged home today w/Hospice services in the home per [name], pt's daughter. ----- Message ----- From: Mort Sawyers Sent: 02/25/2022   3:40 PM EDT To: [name], MD; Belva Chimes, LPN; # Subject: RE: Update, med ?                              Ok, should I just hold off then on the equipment. I have been waiting to send in with your note, but do you think that hospice will be able to assist with getting needed equipment. If not I am happy to send to [facility] Patient, insurance just needs certain things stated in note before they will approve.  ----- Message ----- From: [name], MD Sent: 02/25/2022   2:54 PM EDT To: Mort Sawyers; Belva Chimes, LPN; # Subject: RE: Update, med ?                              The palliative care team can help with a lot of their questions. Some of her meds will be stopped and mainly focus on meds that help her feel better. If hospice admits her at home, they will call me and we will review all of her meds. I was hoping they would consult while still in hospital so they can arrange all needed equipment (now that poor Evelena Peat has been trying to arrange everything) ----- Message ----- From: [name], RN Sent: 02/25/2022   2:36 PM EDT To: [name], MD Subject: Update, med ?                                   [name], pt's daughter called @ 1356. She wanted to make you aware that the family was on the way to the hospital to talk with the Palliative Care team. "We really want to keep her home if we can get some help. Also, what do I need to do about her daily meds?

## 2022-03-01 ENCOUNTER — Other Ambulatory Visit: Payer: Self-pay | Admitting: Oncology

## 2022-03-01 DIAGNOSIS — C50212 Malignant neoplasm of upper-inner quadrant of left female breast: Secondary | ICD-10-CM

## 2022-03-01 DIAGNOSIS — C7951 Secondary malignant neoplasm of bone: Secondary | ICD-10-CM

## 2022-03-01 MED ORDER — MORPHINE SULFATE (CONCENTRATE) 20 MG/ML PO SOLN: 5.0000 mg | ORAL | 0 refills | Status: DC | PRN

## 2022-03-01 MED ORDER — MORPHINE SULFATE (CONCENTRATE) 20 MG/ML PO SOLN: 5.0000 mg | ORAL | 0 refills | Status: AC | PRN

## 2022-03-11 ENCOUNTER — Other Ambulatory Visit: Payer: Medicare Other

## 2022-03-11 ENCOUNTER — Ambulatory Visit: Payer: Medicare Other | Admitting: Oncology

## 2022-03-13 ENCOUNTER — Ambulatory Visit: Payer: Medicare Other

## 2022-03-13 DEATH — deceased

## 2022-04-01 ENCOUNTER — Ambulatory Visit: Payer: Medicare Other | Admitting: Oncology

## 2022-04-01 ENCOUNTER — Other Ambulatory Visit: Payer: Medicare Other

## 2022-04-03 ENCOUNTER — Ambulatory Visit: Payer: Medicare Other

## 2022-04-22 ENCOUNTER — Other Ambulatory Visit: Payer: Medicare Other

## 2022-04-22 ENCOUNTER — Ambulatory Visit: Payer: Medicare Other | Admitting: Oncology

## 2022-04-24 ENCOUNTER — Ambulatory Visit: Payer: Medicare Other

## 2022-05-13 ENCOUNTER — Other Ambulatory Visit: Payer: Medicare Other

## 2022-05-13 ENCOUNTER — Ambulatory Visit: Payer: Medicare Other | Admitting: Oncology

## 2022-05-15 ENCOUNTER — Ambulatory Visit: Payer: Medicare Other

## 2022-07-04 IMAGING — US US BREAST*R* LIMITED INC AXILLA
1 series · 7 of 7 positions shown · non-contrast
Comparison: Previous exam(s).

CLINICAL DATA: 89-year-old female status post remote left
mastectomy for invasive mammary carcinoma presents with suspicious
left axillary lymphadenopathy on recent CT

EXAM:
DIGITAL DIAGNOSTIC UNILATERAL RIGHT MAMMOGRAM WITH TOMOSYNTHESIS AND
CAD; US AXILLARY LEFT; ULTRASOUND RIGHT BREAST LIMITED
TECHNIQUE: Right digital diagnostic mammography and breast tomosynthesis was
performed. The images were evaluated with computer-aided detection.;
Targeted ultrasound examination of the left axilla was performed.;
Targeted ultrasound examination of the right breast was performed

[Series 1: us breast*right* limited inc axilla · 0.06mm/px · 7 of 7 slices shown]
[im 1/7]
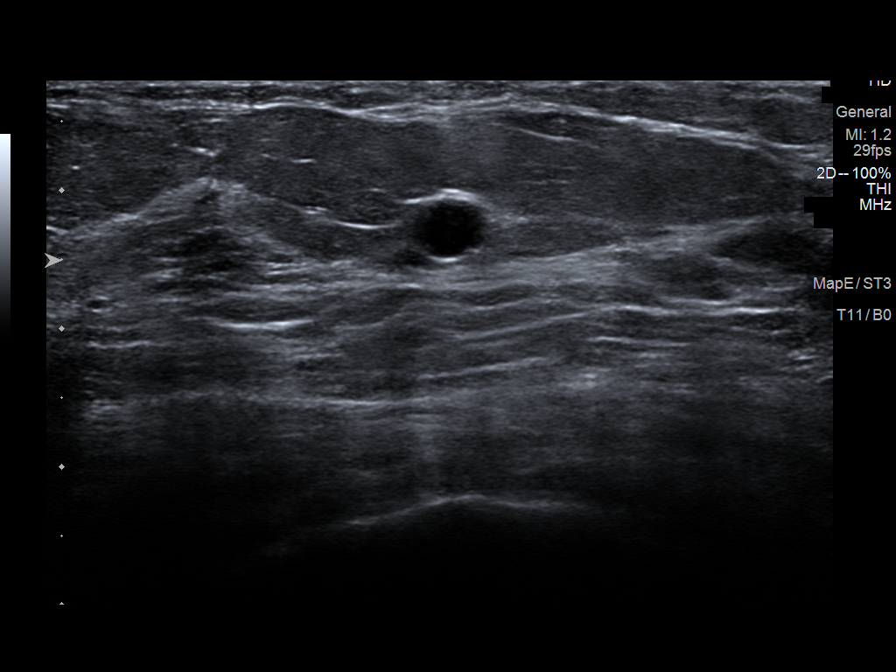
[im 2/7]
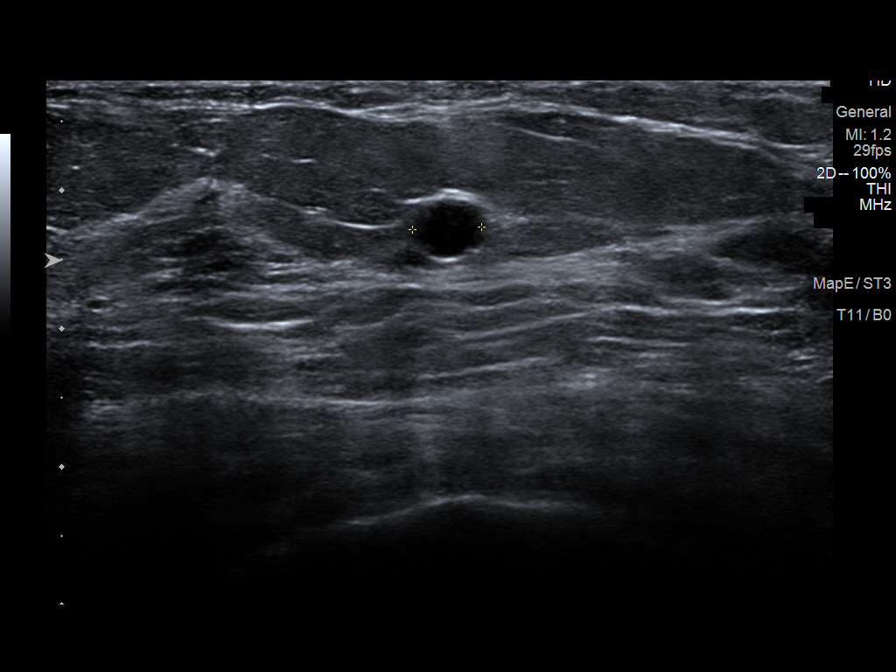
[im 3/7]
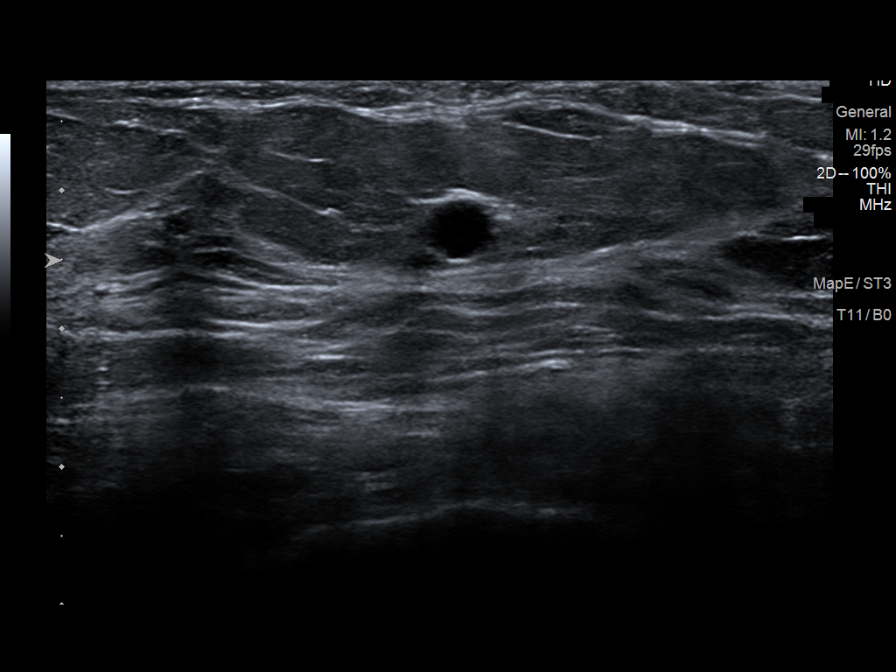
[im 4/7]
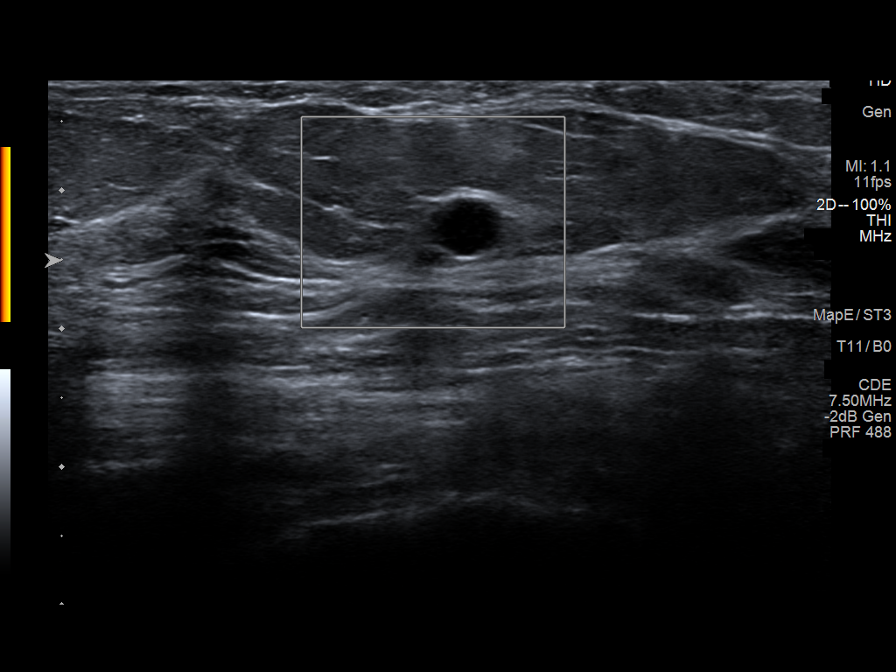
[im 5/7]
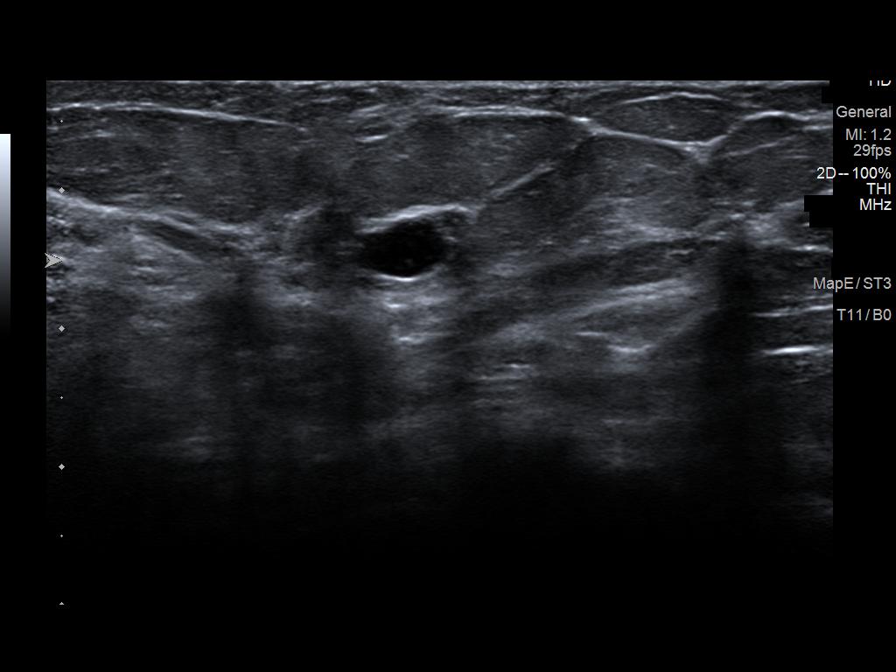
[im 6/7]
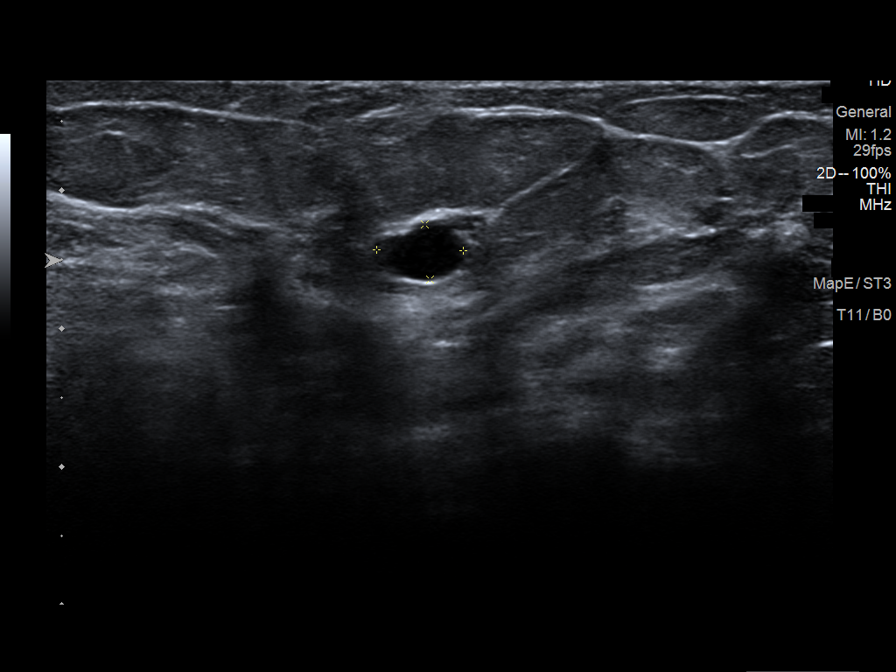
[im 7/7]
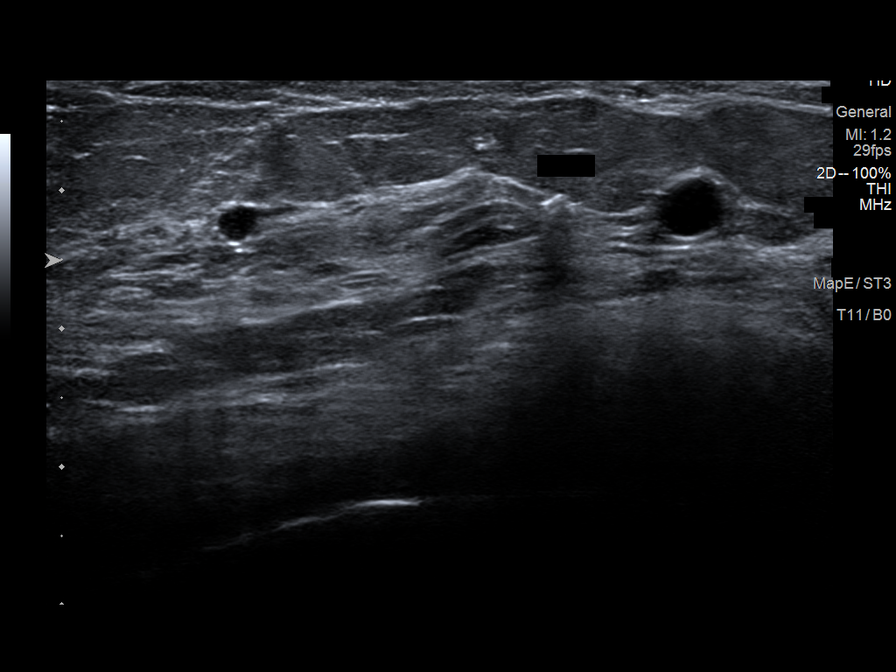

[7 of 7 positions shown; findings below may reference images not displayed]

ACR Breast Density Category b: There are scattered areas of
fibroglandular density.
FINDINGS: The patient is status post left mastectomy. There is an oval,
circumscribed equal density mass in the central medial right breast
at middle depth adjacent to a coil shaped. No additional suspicious
findings in the remainder of the right breast.

Targeted ultrasound is performed, showing an oval, circumscribed
anechoic mass at the [DATE] position 3 cm from nipple. It measures 6 x
5 x 4 mm. This is seen adjacent to a post biopsy clip at the 2
o'clock position 2 cm from the nipple. This correlates well with the
mammographic finding and is consistent with a benign simple cyst.

Evaluation of the left axilla demonstrates multiple morphologically
abnormal left axillary lymph nodes demonstrating diffuse
hypoechogenicity incomplete hilar effacement. An irregular mass is
seen originating from 1 of the lymph nodes measuring up to 1.7 cm.
IMPRESSION: 1. Highly suspicious left axillary lymphadenopathy. Recommendation
is for ultrasound-guided biopsy.
2. No mammographic or sonographic evidence of malignancy on the
right.

RECOMMENDATION:
Ultrasound-guided biopsy of the left axilla. This is scheduled for
later the same day. Please see additional dictation.

I have discussed the findings and recommendations with the patient.
If applicable, a reminder letter will be sent to the patient
regarding the next appointment.

BI-RADS CATEGORY  5: Highly suggestive of malignancy.

## 2022-07-04 IMAGING — US US AXILLARY NODE CORE BIOPSY LEFT
1 series · 11 of 11 positions shown · non-contrast
Comparison: Previous exam(s).
COMPARISON: Previous exam(s).

Addendum:
CLINICAL DATA: Year old female with suspicious left axillary
lymphadenopathy

EXAM:
US AXILLARY NODE CORE BIOPSY LEFT

[Series 1: us axillary node core biopsy left · 0.07mm/px · 11 of 11 slices shown]
[im 1/11]
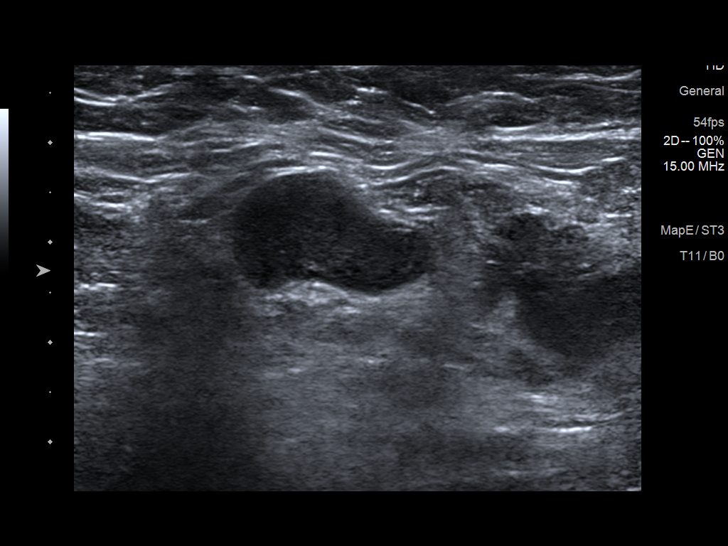
[im 2/11]
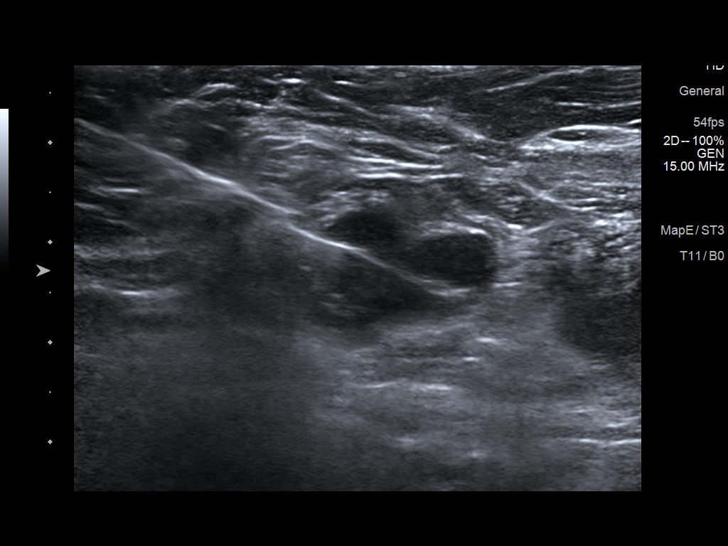
[im 3/11]
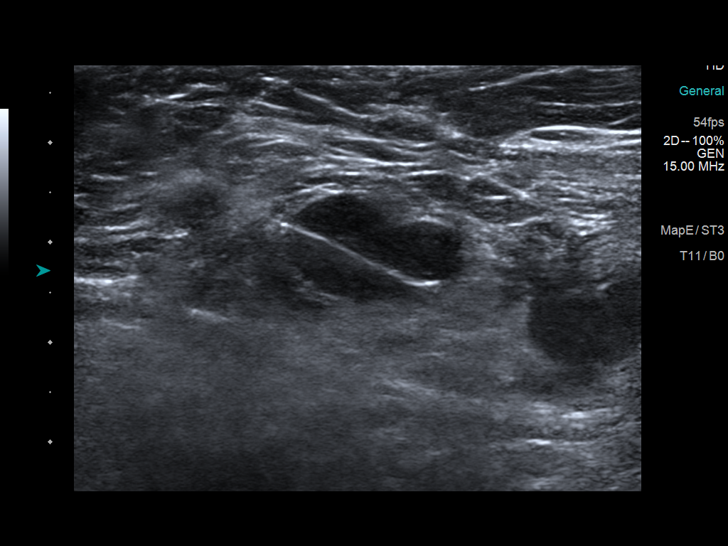
[im 4/11]
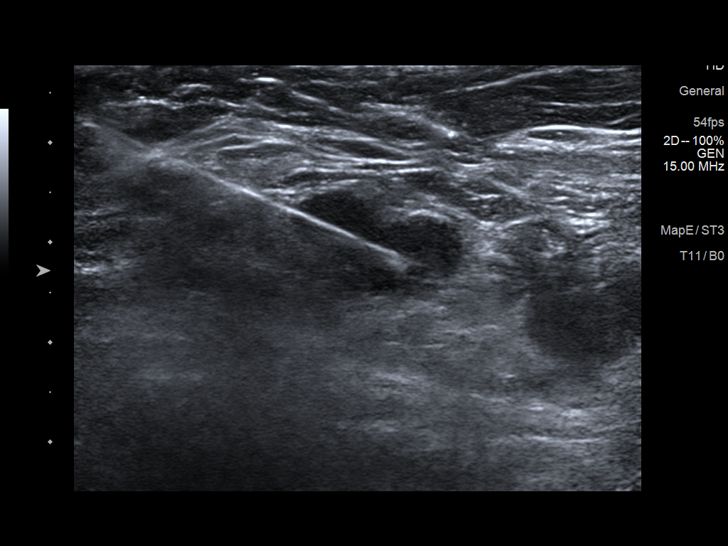
[im 5/11]
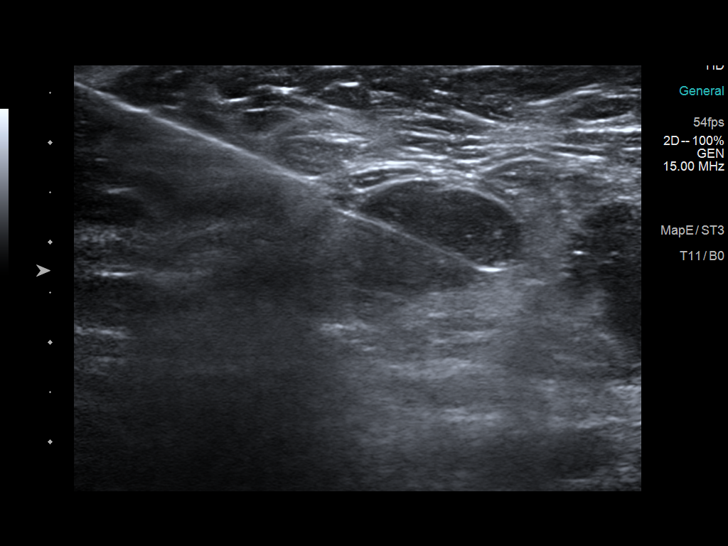
[im 6/11]
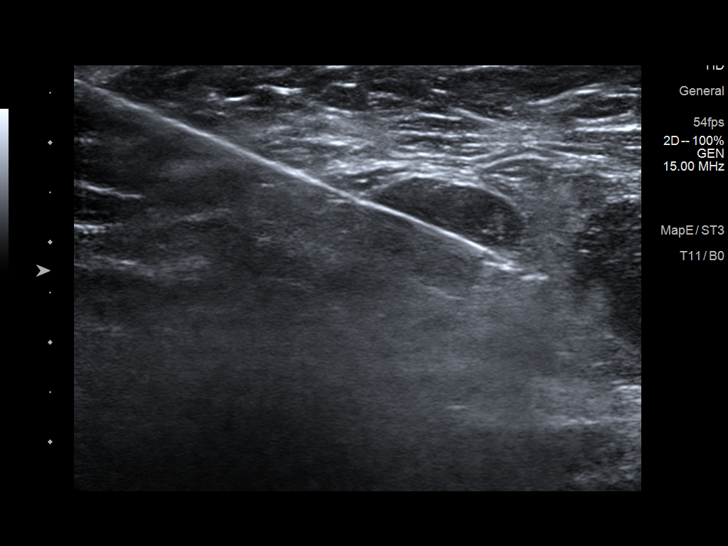
[im 7/11]
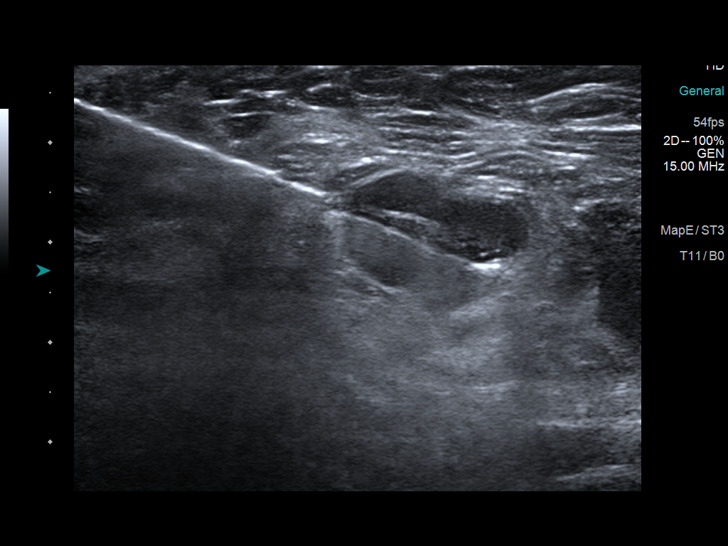
[im 8/11]
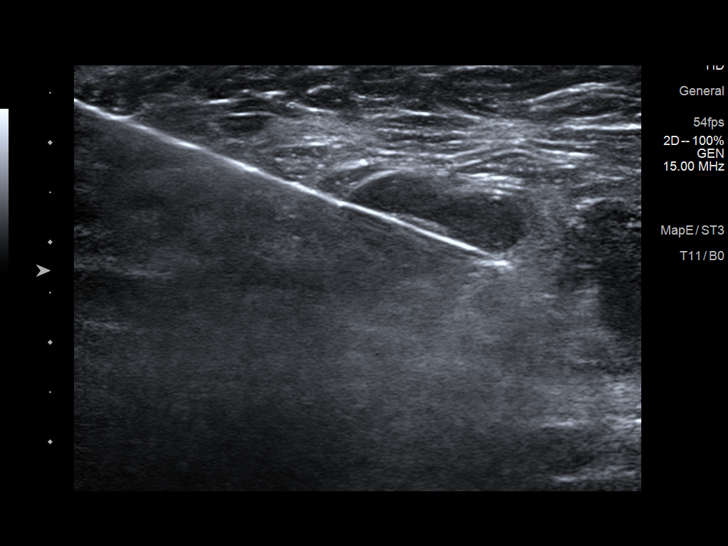
[im 9/11]
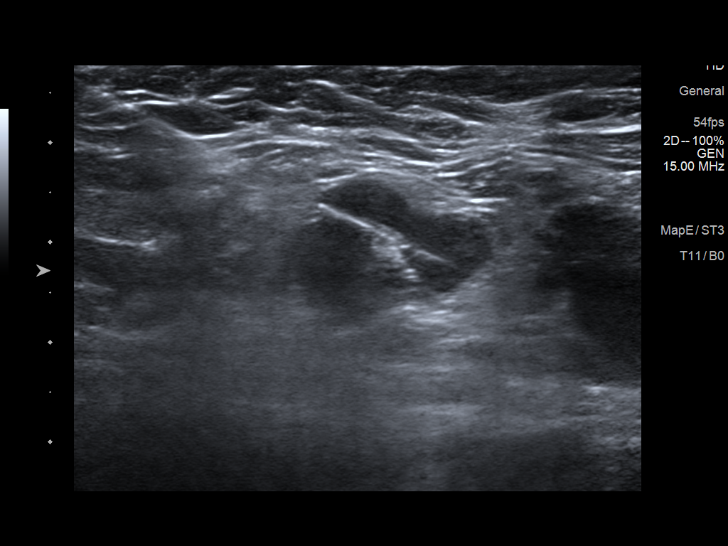
[im 10/11]
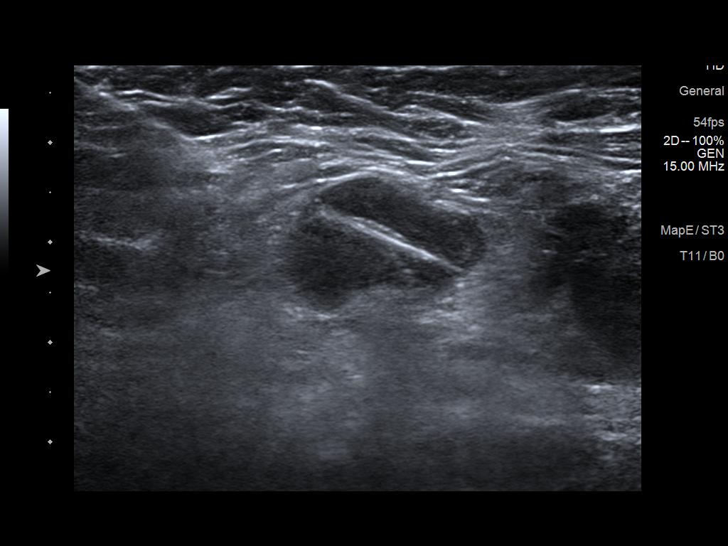
[im 11/11]
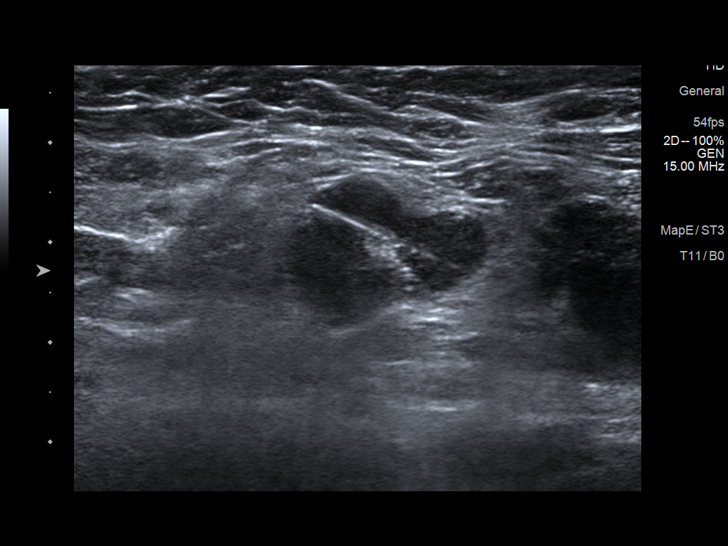

[11 of 11 positions shown; findings below may reference images not displayed]



Using sterile technique and 1% Lidocaine as local anesthetic, under
direct ultrasound visualization, a 14 gauge Nomasibulele device was
used to perform biopsy of of an enlarged left axillary lymph node
using a inferior approach. At the conclusion of the procedure a
tribell tissue marker clip was deployed into the biopsy cavity.
Follow up 2 view mammogram was performed and dictated separately.
IMPRESSION: Ultrasound guided biopsy of the left axilla. No apparent
complications.

ADDENDUM:
Pathology revealed CARCINOMA INVOLVING ADIPOSE TISSUE of the LEFT
axilla. No lymph node tissue is identified. This was found to be
concordant by Dr. Orduxani Galunova.

Pathology results were discussed with the patient by telephone. The
patient reported doing well after the biopsy with minimal tenderness
at the site. Post biopsy instructions and care were reviewed and
questions were answered. The patient was encouraged to call The
direct phone number was provided.

The patient is scheduled to see Dr. Nahom Jemison at [HOSPITAL]
[HOSPITAL] on September 02, 2020.

Pathology results reported by Prezes August, RN on 09/02/2020.



Using sterile technique and 1% Lidocaine as local anesthetic, under
direct ultrasound visualization, a 14 gauge Nomasibulele device was
used to perform biopsy of of an enlarged left axillary lymph node
using a inferior approach. At the conclusion of the procedure a
tribell tissue marker clip was deployed into the biopsy cavity.
Follow up 2 view mammogram was performed and dictated separately.
IMPRESSION: Ultrasound guided biopsy of the left axilla. No apparent
complications.

## 2023-08-22 IMAGING — CR DG CHEST 2V
2 series · 2 of 2 positions shown · non-contrast
Comparison: Chest x-rays dated 04/26/2021 and 03/04/2020.

CLINICAL DATA: Chest pain

EXAM:
CHEST - 2 VIEW

[w chest pa]
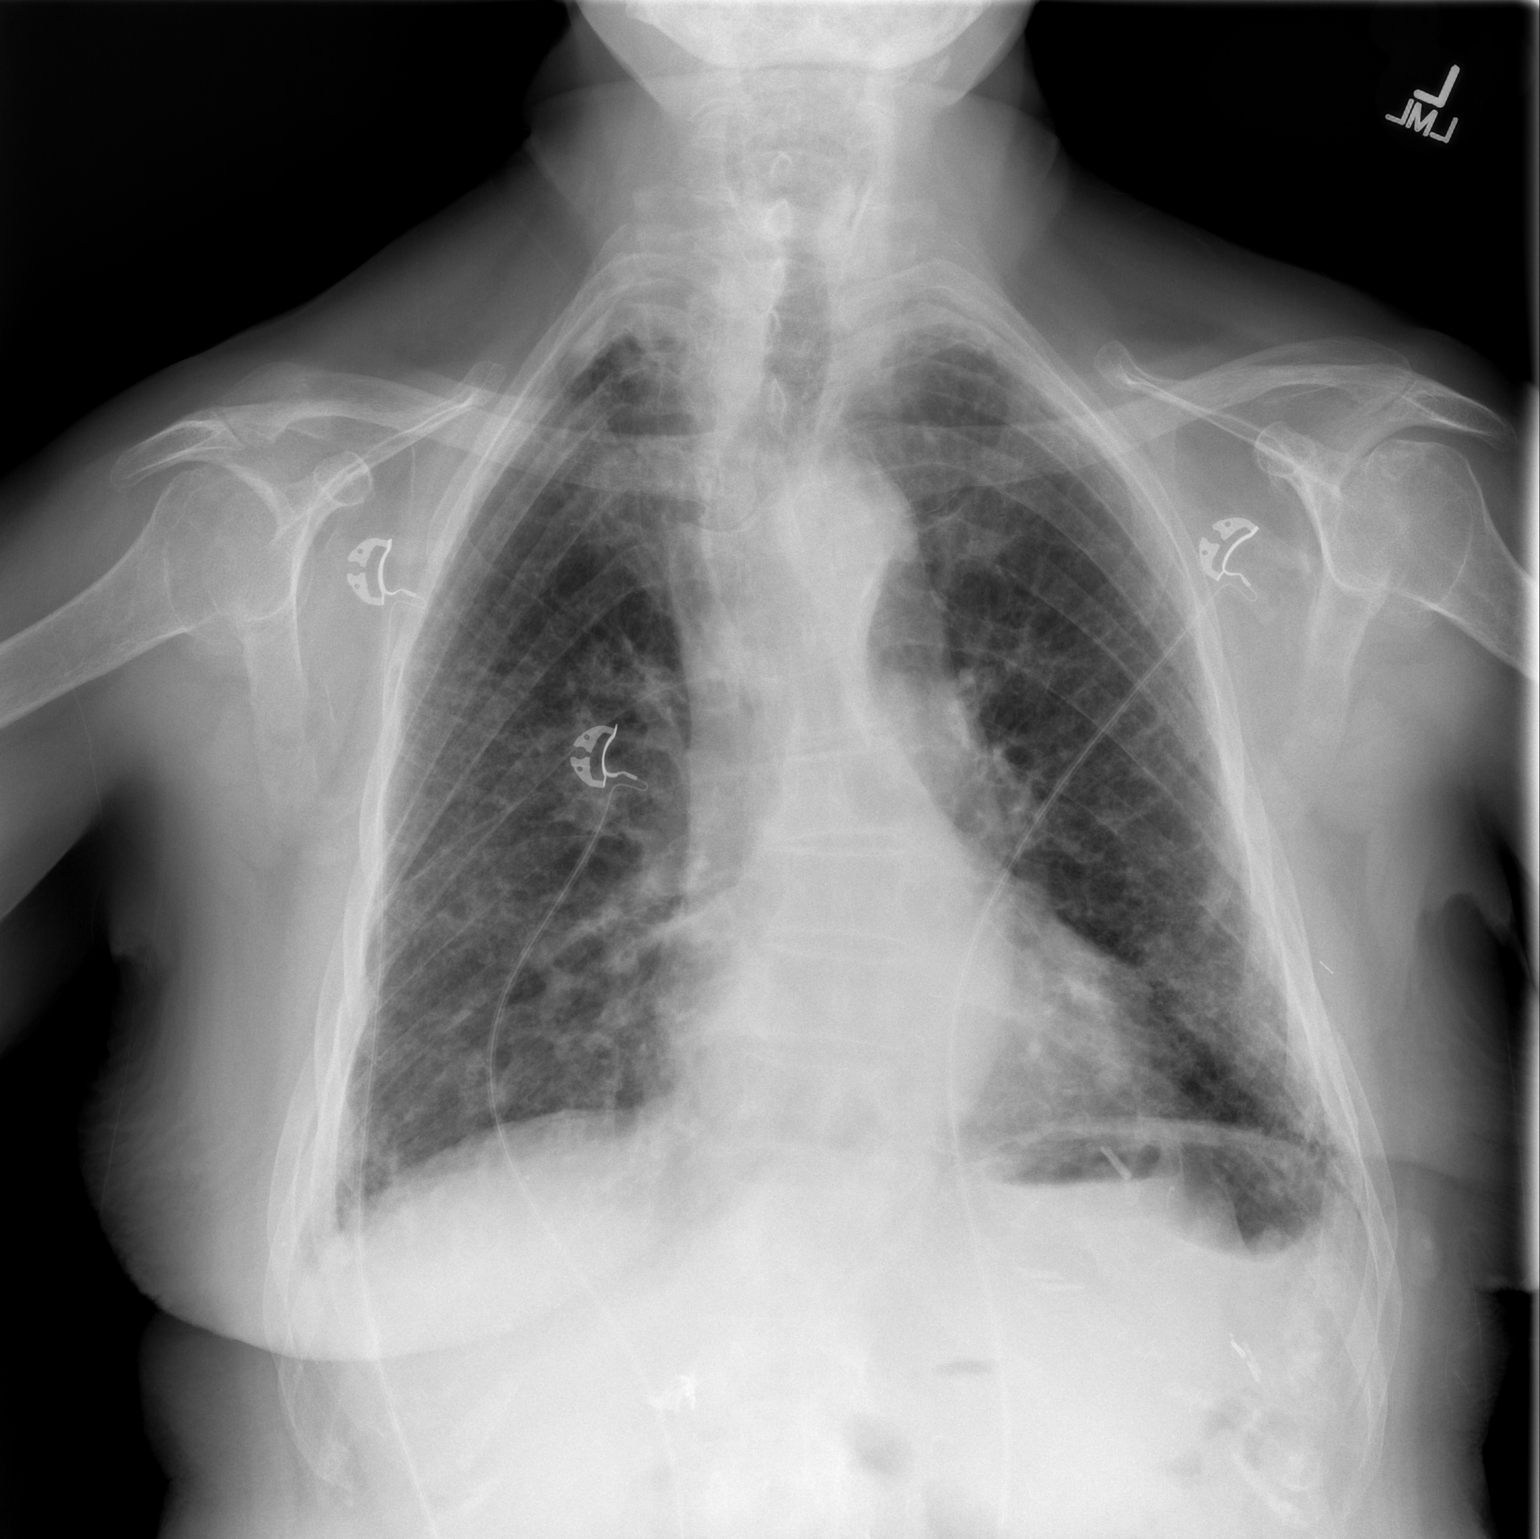

[w chest lat]
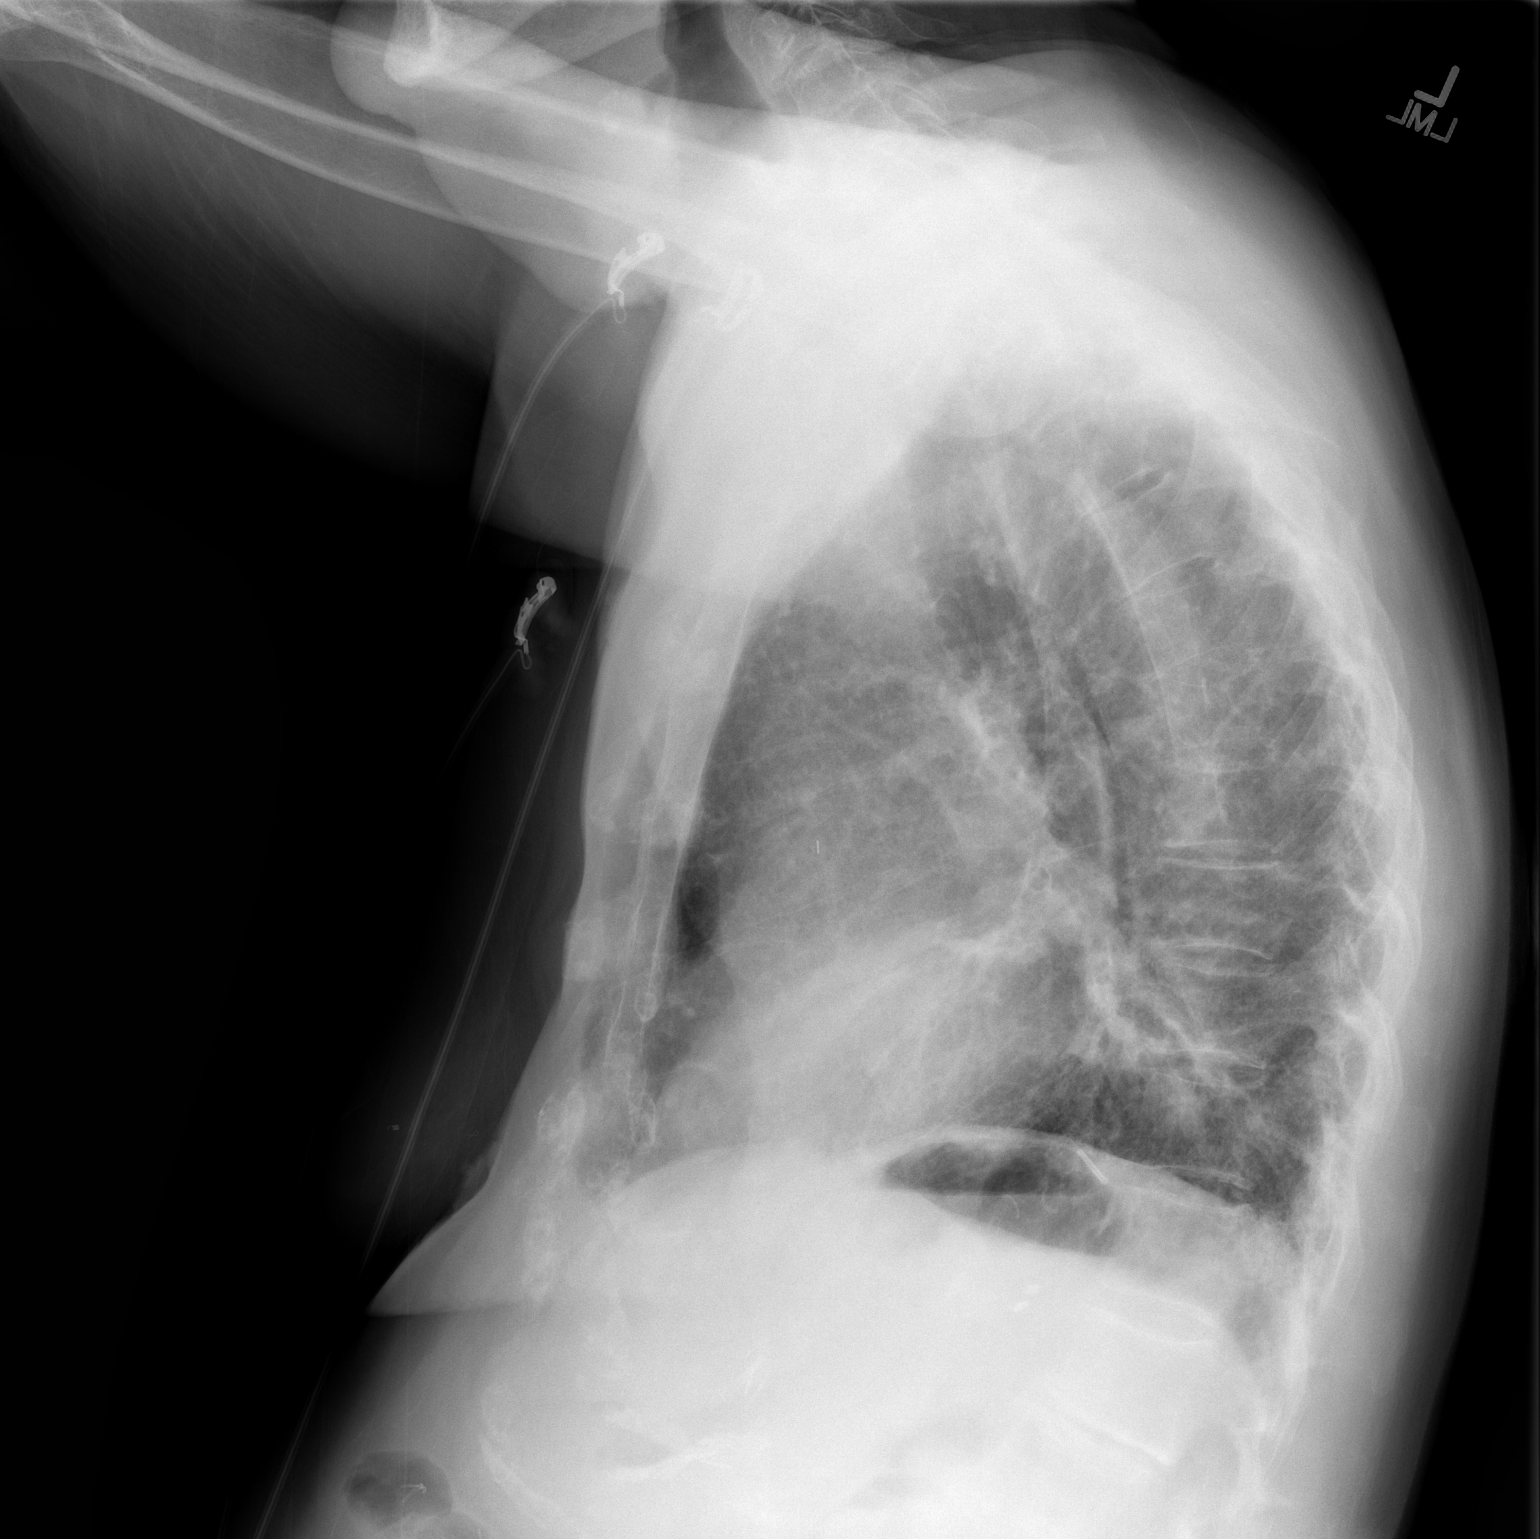

[2 of 2 positions shown; findings below may reference images not displayed]

FINDINGS: Heart size and mediastinal contours are stable. Coarse lung markings
are again seen bilaterally, stable. No confluent opacity to suggest
a superimposed pneumonia. No pleural effusion or pneumothorax is
seen. No acute-appearing osseous abnormality.
IMPRESSION: 1. No active cardiopulmonary disease. No evidence of pneumonia or
pulmonary edema.
2. Stable chronic interstitial lung disease/fibrosis and probable
chronic bronchitic change.

## 2023-08-22 IMAGING — CT CT ANGIO CHEST
2 of 8 series · 18 of 36 positions shown · IV contrast (Omnipaque)
Comparison: PET-CT, 10/07/2020

CLINICAL DATA: Left-sided chest pain, PE suspected

EXAM:
CT ANGIOGRAPHY CHEST WITH CONTRAST
TECHNIQUE: Multidetector CT imaging of the chest was performed using the
standard protocol during bolus administration of intravenous
contrast. Multiplanar CT image reconstructions and MIPs were
obtained to evaluate the vascular anatomy.

[Series 6: pe thins · axial · 0.65mm/px · z∈[-276,-39]mm · 17 of 265 slices shown]
[im 14/265  lung]
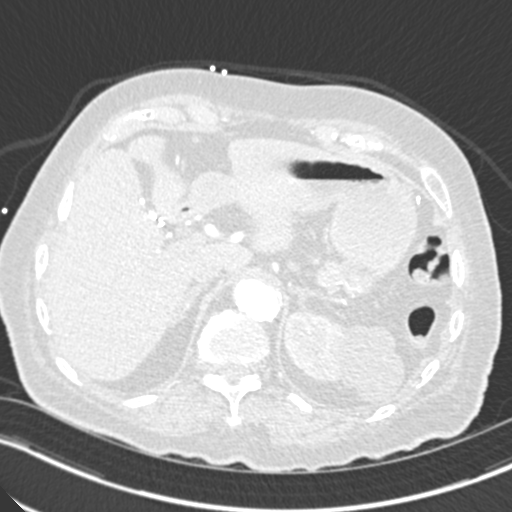
[im 28/265  mediastinal]
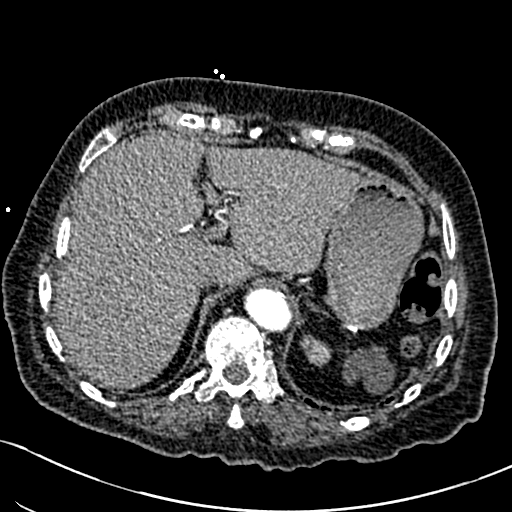
[im 42/265  lung]
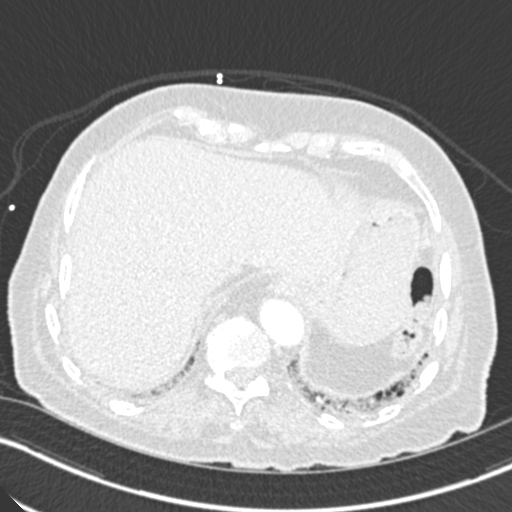
[im 56/265  mediastinal]
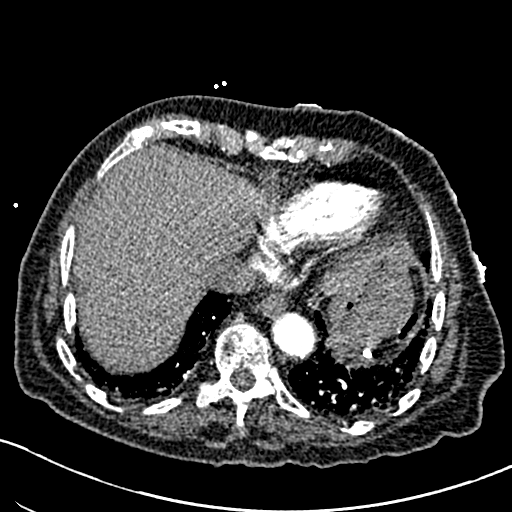
[im 70/265  lung]
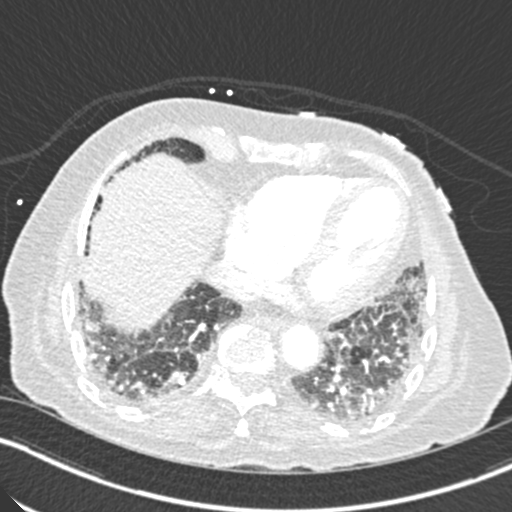
[im 84/265  mediastinal]
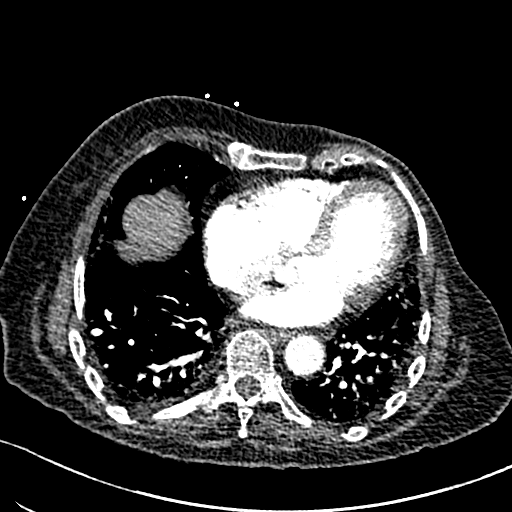
[im 98/265  lung]
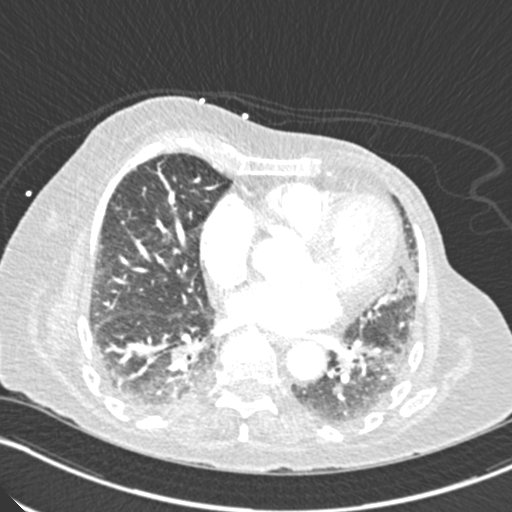
[im 112/265  mediastinal]
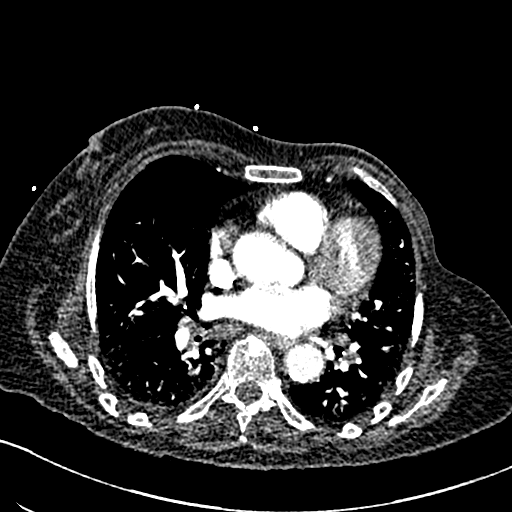
[im 139/265  lung]
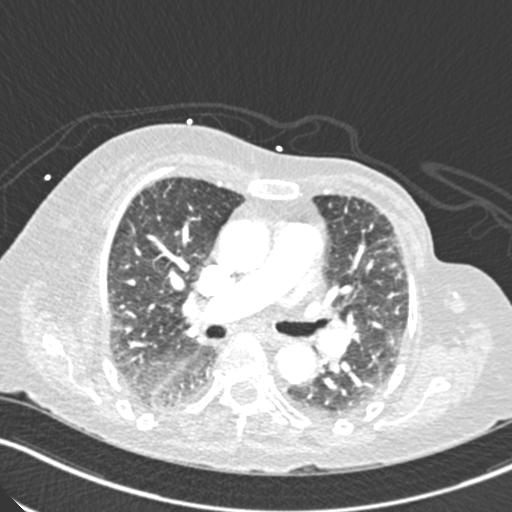
[im 153/265  mediastinal]
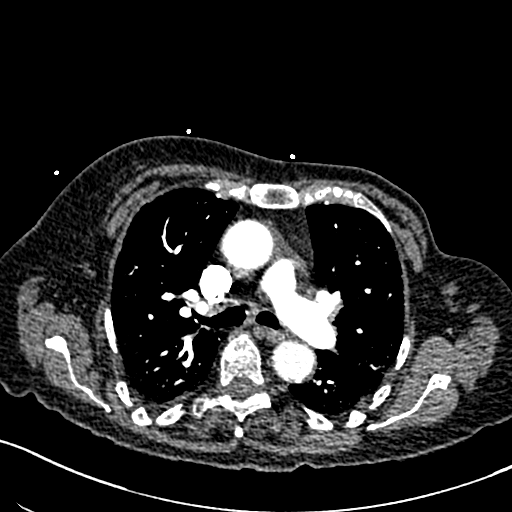
[im 167/265  lung]
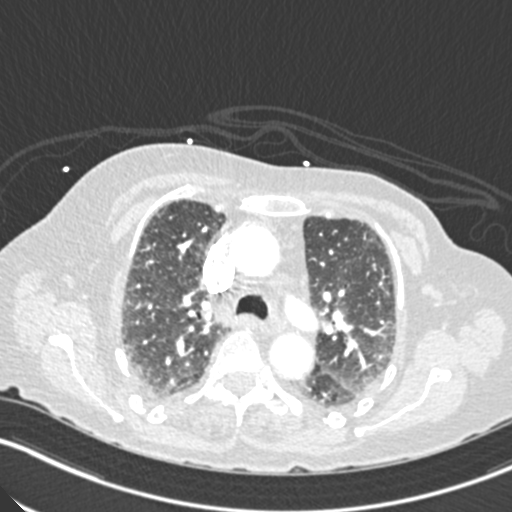
[im 181/265  mediastinal]
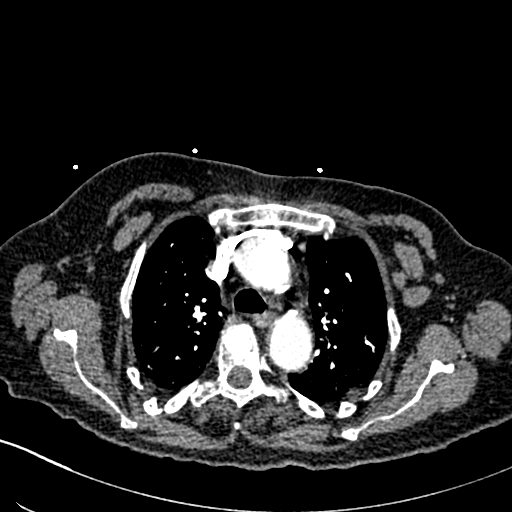
[im 195/265  lung]
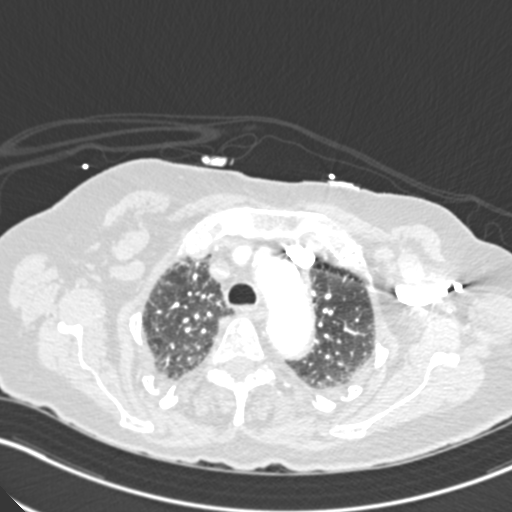
[im 209/265  mediastinal]
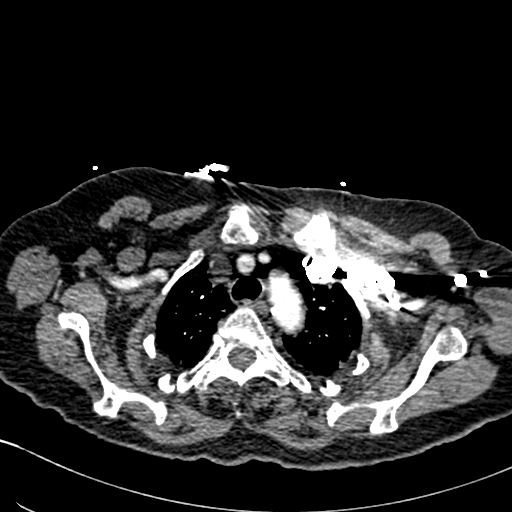
[im 223/265  lung]
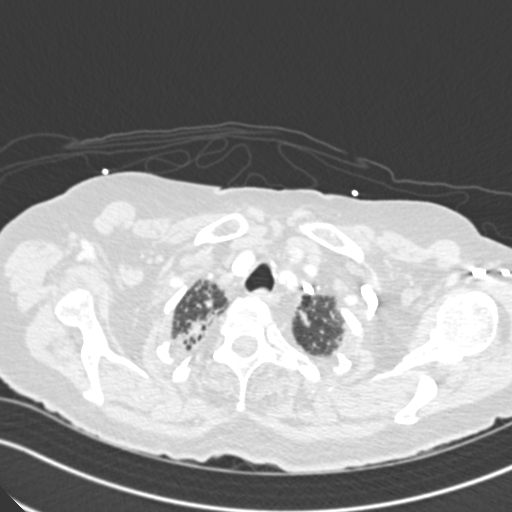
[im 237/265  mediastinal]
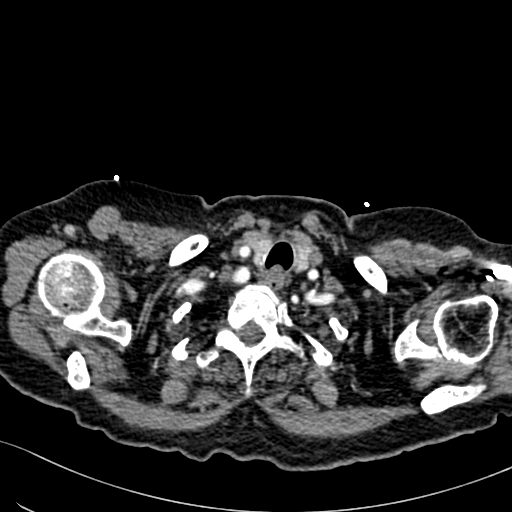
[im 251/265  lung]
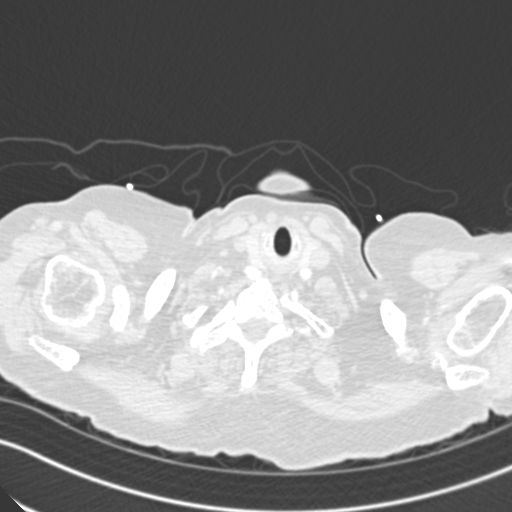

[Series 7: pe coronal mpr · coronal · 0.52mm/px · 1 of 151 slices shown]
[im 76/151  mediastinal]
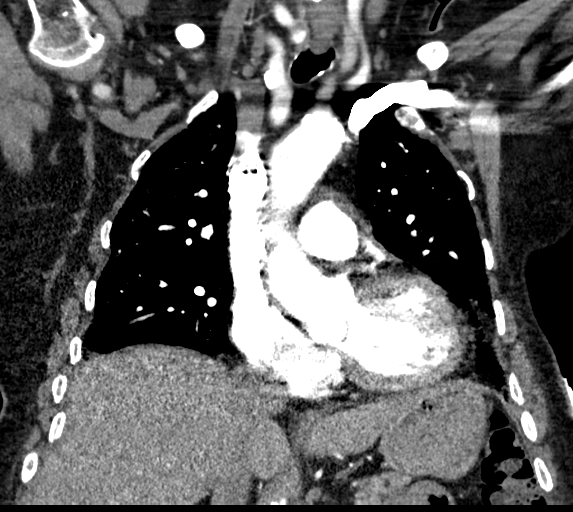

[18 of 36 positions shown; findings below may reference images not displayed]

RADIATION DOSE REDUCTION: This exam was performed according to the
departmental dose-optimization program which includes automated
exposure control, adjustment of the mA and/or kV according to
patient size and/or use of iterative reconstruction technique.

CONTRAST:  75mL OMNIPAQUE IOHEXOL 350 MG/ML SOLN
FINDINGS: Cardiovascular: Satisfactory opacification of the pulmonary arteries
to the segmental level. No evidence of pulmonary embolism. Mild
cardiomegaly. Scattered left coronary artery calcifications. No
pericardial effusion. Aortic atherosclerosis.

Mediastinum/Nodes: Similar appearance of enlarged left axillary and
subpectoral lymph nodes, measuring up to 1.7 x 1.3 cm (series 4,
image 29). No other enlarged mediastinal, hilar, or axillary lymph
nodes. Thyroid gland, trachea, and esophagus demonstrate no
significant findings.

Lungs/Pleura: Mild pulmonary fibrosis in a pattern with apical to
basal gradient, featuring areas of subpleural bronchiolectasis at
the lung bases, incompletely characterized by this expiratory
angiographic examination. No pleural effusion or pneumothorax.

Upper Abdomen: No acute abnormality. Thinly septated, benign
partially imaged left renal cyst, for which no further follow-up or
characterization is required (series 4, image 89). Status post
splenectomy.

Musculoskeletal: Status post left mastectomy. Irregular, sclerotic
osseous metastatic disease throughout.

Review of the MIP images confirms the above findings.
IMPRESSION: 1. Negative examination for pulmonary embolism.
2. Mild pulmonary fibrosis in a pattern with apical to basal
gradient, featuring areas of subpleural bronchiolectasis at the lung
bases, incompletely characterized by this expiratory angiographic
examination.
3. Coronary artery disease.
4. Similar appearance of enlarged metastatic left axillary and
subpectoral lymph nodes as well as sclerotic osseous metastatic
disease, in keeping with known metastatic breast malignancy.

Aortic Atherosclerosis ([id number]).
